# Patient Record
Sex: Female | Born: 1945 | Race: White | Hispanic: No | Marital: Married | Smoking: Never smoker
Health system: Southern US, Community
[De-identification: ages and names within clinical notes are randomized; demographics above are authoritative.]

## PROBLEM LIST (undated history)

## (undated) DIAGNOSIS — M545 Low back pain, unspecified: Secondary | ICD-10-CM

## (undated) DIAGNOSIS — I7300 Raynaud's syndrome without gangrene: Secondary | ICD-10-CM

## (undated) DIAGNOSIS — K8020 Calculus of gallbladder without cholecystitis without obstruction: Secondary | ICD-10-CM

## (undated) DIAGNOSIS — H532 Diplopia: Secondary | ICD-10-CM

## (undated) DIAGNOSIS — G8929 Other chronic pain: Secondary | ICD-10-CM

## (undated) DIAGNOSIS — S060XAA Concussion with loss of consciousness status unknown, initial encounter: Secondary | ICD-10-CM

## (undated) DIAGNOSIS — K635 Polyp of colon: Secondary | ICD-10-CM

## (undated) DIAGNOSIS — S060X9A Concussion with loss of consciousness of unspecified duration, initial encounter: Secondary | ICD-10-CM

## (undated) DIAGNOSIS — N951 Menopausal and female climacteric states: Secondary | ICD-10-CM

## (undated) DIAGNOSIS — G4700 Insomnia, unspecified: Secondary | ICD-10-CM

## (undated) DIAGNOSIS — T7840XA Allergy, unspecified, initial encounter: Secondary | ICD-10-CM

## (undated) DIAGNOSIS — Z90710 Acquired absence of both cervix and uterus: Secondary | ICD-10-CM

## (undated) DIAGNOSIS — M4310 Spondylolisthesis, site unspecified: Secondary | ICD-10-CM

## (undated) DIAGNOSIS — M810 Age-related osteoporosis without current pathological fracture: Secondary | ICD-10-CM

## (undated) DIAGNOSIS — Z973 Presence of spectacles and contact lenses: Secondary | ICD-10-CM

## (undated) DIAGNOSIS — M8580 Other specified disorders of bone density and structure, unspecified site: Secondary | ICD-10-CM

## (undated) DIAGNOSIS — H269 Unspecified cataract: Secondary | ICD-10-CM

## (undated) DIAGNOSIS — J189 Pneumonia, unspecified organism: Secondary | ICD-10-CM

## (undated) DIAGNOSIS — J302 Other seasonal allergic rhinitis: Secondary | ICD-10-CM

## (undated) DIAGNOSIS — M1990 Unspecified osteoarthritis, unspecified site: Secondary | ICD-10-CM

## (undated) DIAGNOSIS — C50919 Malignant neoplasm of unspecified site of unspecified female breast: Secondary | ICD-10-CM

## (undated) DIAGNOSIS — C50311 Malignant neoplasm of lower-inner quadrant of right female breast: Secondary | ICD-10-CM

## (undated) DIAGNOSIS — Z9989 Dependence on other enabling machines and devices: Secondary | ICD-10-CM

## (undated) DIAGNOSIS — C801 Malignant (primary) neoplasm, unspecified: Secondary | ICD-10-CM

## (undated) HISTORY — DX: Other specified disorders of bone density and structure, unspecified site: M85.80

## (undated) HISTORY — DX: Unspecified osteoarthritis, unspecified site: M19.90

## (undated) HISTORY — DX: Unspecified cataract: H26.9

## (undated) HISTORY — DX: Raynaud's syndrome without gangrene: I73.00

## (undated) HISTORY — PX: ENDOSCOPIC VEIN LASER TREATMENT: SHX1508

## (undated) HISTORY — DX: Malignant neoplasm of lower-inner quadrant of right female breast: C50.311

## (undated) HISTORY — PX: LAPAROSCOPIC ASSISTED VAGINAL HYSTERECTOMY: SHX5398

## (undated) HISTORY — DX: Calculus of gallbladder without cholecystitis without obstruction: K80.20

## (undated) HISTORY — DX: Age-related osteoporosis without current pathological fracture: M81.0

## (undated) HISTORY — PX: VAGINAL HYSTERECTOMY: SUR661

## (undated) HISTORY — PX: WISDOM TOOTH EXTRACTION: SHX21

## (undated) HISTORY — DX: Allergy, unspecified, initial encounter: T78.40XA

## (undated) HISTORY — DX: Malignant (primary) neoplasm, unspecified: C80.1

## (undated) HISTORY — DX: Concussion with loss of consciousness of unspecified duration, initial encounter: S06.0X9A

## (undated) HISTORY — PX: POLYPECTOMY: SHX149

## (undated) HISTORY — PX: TUBAL LIGATION: SHX77

## (undated) HISTORY — PX: FOOT NEUROMA SURGERY: SHX646

## (undated) HISTORY — PX: CHOLECYSTECTOMY: SHX55

## (undated) HISTORY — DX: Spondylolisthesis, site unspecified: M43.10

## (undated) HISTORY — PX: APPENDECTOMY: SHX54

## (undated) HISTORY — DX: Polyp of colon: K63.5

## (undated) HISTORY — DX: Insomnia, unspecified: G47.00

## (undated) HISTORY — PX: JOINT REPLACEMENT: SHX530

## (undated) HISTORY — PX: TONSILLECTOMY: SUR1361

---

## 1997-11-15 ENCOUNTER — Other Ambulatory Visit: Admission: RE | Admit: 1997-11-15 | Discharge: 1997-11-15 | Payer: Self-pay | Admitting: Gynecology

## 1998-09-04 ENCOUNTER — Other Ambulatory Visit: Admission: RE | Admit: 1998-09-04 | Discharge: 1998-09-04 | Payer: Self-pay | Admitting: Gynecology

## 1998-10-30 ENCOUNTER — Inpatient Hospital Stay (HOSPITAL_COMMUNITY): Admission: RE | Admit: 1998-10-30 | Discharge: 1998-11-01 | Payer: Self-pay | Admitting: Gynecology

## 1999-07-31 ENCOUNTER — Encounter: Admission: RE | Admit: 1999-07-31 | Discharge: 1999-08-14 | Payer: Self-pay | Admitting: Orthopaedic Surgery

## 1999-10-22 ENCOUNTER — Encounter: Payer: Self-pay | Admitting: Gynecology

## 1999-10-22 ENCOUNTER — Ambulatory Visit (HOSPITAL_COMMUNITY): Admission: RE | Admit: 1999-10-22 | Discharge: 1999-10-22 | Payer: Self-pay | Admitting: Gynecology

## 1999-10-22 ENCOUNTER — Other Ambulatory Visit: Admission: RE | Admit: 1999-10-22 | Discharge: 1999-10-22 | Payer: Self-pay | Admitting: Gynecology

## 2000-02-18 ENCOUNTER — Encounter: Payer: Self-pay | Admitting: Internal Medicine

## 2000-02-19 ENCOUNTER — Encounter: Payer: Self-pay | Admitting: Internal Medicine

## 2000-03-24 ENCOUNTER — Ambulatory Visit (HOSPITAL_COMMUNITY): Admission: RE | Admit: 2000-03-24 | Discharge: 2000-03-24 | Payer: Self-pay | Admitting: Occupational Medicine

## 2000-03-24 ENCOUNTER — Encounter (INDEPENDENT_AMBULATORY_CARE_PROVIDER_SITE_OTHER): Payer: Self-pay | Admitting: *Deleted

## 2000-04-05 ENCOUNTER — Encounter: Payer: Self-pay | Admitting: Internal Medicine

## 2000-12-14 ENCOUNTER — Encounter: Admission: RE | Admit: 2000-12-14 | Discharge: 2001-03-14 | Payer: Self-pay | Admitting: Gynecology

## 2000-12-14 ENCOUNTER — Other Ambulatory Visit: Admission: RE | Admit: 2000-12-14 | Discharge: 2000-12-14 | Payer: Self-pay | Admitting: Gynecology

## 2001-12-14 ENCOUNTER — Other Ambulatory Visit: Admission: RE | Admit: 2001-12-14 | Discharge: 2001-12-14 | Payer: Self-pay | Admitting: Gynecology

## 2002-12-31 ENCOUNTER — Other Ambulatory Visit: Admission: RE | Admit: 2002-12-31 | Discharge: 2002-12-31 | Payer: Self-pay | Admitting: Gynecology

## 2003-08-07 ENCOUNTER — Ambulatory Visit (HOSPITAL_COMMUNITY): Admission: RE | Admit: 2003-08-07 | Discharge: 2003-08-07 | Payer: Self-pay | Admitting: Unknown Physician Specialty

## 2004-02-04 ENCOUNTER — Other Ambulatory Visit: Admission: RE | Admit: 2004-02-04 | Discharge: 2004-02-04 | Payer: Self-pay | Admitting: Gynecology

## 2004-05-02 ENCOUNTER — Emergency Department (HOSPITAL_COMMUNITY): Admission: EM | Admit: 2004-05-02 | Discharge: 2004-05-02 | Payer: Self-pay | Admitting: Family Medicine

## 2004-05-12 ENCOUNTER — Encounter: Admission: RE | Admit: 2004-05-12 | Discharge: 2004-07-02 | Payer: Self-pay | Admitting: Orthopedic Surgery

## 2004-07-09 ENCOUNTER — Ambulatory Visit: Payer: Self-pay | Admitting: Family Medicine

## 2004-08-13 ENCOUNTER — Ambulatory Visit: Payer: Self-pay | Admitting: Family Medicine

## 2004-08-21 ENCOUNTER — Encounter: Admission: RE | Admit: 2004-08-21 | Discharge: 2004-11-19 | Payer: Self-pay | Admitting: Gynecology

## 2005-01-04 ENCOUNTER — Ambulatory Visit: Payer: Self-pay | Admitting: Family Medicine

## 2005-02-17 ENCOUNTER — Other Ambulatory Visit: Admission: RE | Admit: 2005-02-17 | Discharge: 2005-02-17 | Payer: Self-pay | Admitting: Gynecology

## 2006-02-24 ENCOUNTER — Encounter: Payer: Self-pay | Admitting: Internal Medicine

## 2006-02-25 ENCOUNTER — Encounter: Payer: Self-pay | Admitting: Internal Medicine

## 2006-03-04 ENCOUNTER — Other Ambulatory Visit: Admission: RE | Admit: 2006-03-04 | Discharge: 2006-03-04 | Payer: Self-pay | Admitting: Gynecology

## 2006-03-10 ENCOUNTER — Ambulatory Visit: Payer: Self-pay | Admitting: Family Medicine

## 2006-04-20 ENCOUNTER — Ambulatory Visit (HOSPITAL_COMMUNITY): Admission: RE | Admit: 2006-04-20 | Discharge: 2006-04-20 | Payer: Self-pay | Admitting: Gastroenterology

## 2006-04-20 ENCOUNTER — Encounter: Payer: Self-pay | Admitting: Internal Medicine

## 2006-04-20 ENCOUNTER — Encounter (INDEPENDENT_AMBULATORY_CARE_PROVIDER_SITE_OTHER): Payer: Self-pay | Admitting: *Deleted

## 2006-04-20 ENCOUNTER — Encounter (INDEPENDENT_AMBULATORY_CARE_PROVIDER_SITE_OTHER): Payer: Self-pay | Admitting: Specialist

## 2006-05-06 ENCOUNTER — Ambulatory Visit: Payer: Self-pay | Admitting: Family Medicine

## 2006-05-31 ENCOUNTER — Ambulatory Visit: Payer: Self-pay | Admitting: Family Medicine

## 2006-10-20 ENCOUNTER — Ambulatory Visit: Payer: Self-pay | Admitting: Family Medicine

## 2006-11-03 ENCOUNTER — Ambulatory Visit: Payer: Self-pay | Admitting: Family Medicine

## 2006-11-29 ENCOUNTER — Ambulatory Visit: Payer: Self-pay | Admitting: Family Medicine

## 2007-03-07 ENCOUNTER — Other Ambulatory Visit: Admission: RE | Admit: 2007-03-07 | Discharge: 2007-03-07 | Payer: Self-pay | Admitting: Gynecology

## 2008-04-08 ENCOUNTER — Ambulatory Visit: Payer: Self-pay | Admitting: Gynecology

## 2008-04-10 ENCOUNTER — Encounter: Payer: Self-pay | Admitting: Gynecology

## 2008-04-10 ENCOUNTER — Ambulatory Visit: Payer: Self-pay | Admitting: Gynecology

## 2008-04-10 ENCOUNTER — Other Ambulatory Visit: Admission: RE | Admit: 2008-04-10 | Discharge: 2008-04-10 | Payer: Self-pay | Admitting: Gynecology

## 2009-05-19 ENCOUNTER — Encounter: Payer: Self-pay | Admitting: Gynecology

## 2009-05-19 ENCOUNTER — Ambulatory Visit: Payer: Self-pay | Admitting: Gynecology

## 2009-05-19 ENCOUNTER — Other Ambulatory Visit: Admission: RE | Admit: 2009-05-19 | Discharge: 2009-05-19 | Payer: Self-pay | Admitting: Gynecology

## 2009-06-09 ENCOUNTER — Encounter (INDEPENDENT_AMBULATORY_CARE_PROVIDER_SITE_OTHER): Payer: Self-pay | Admitting: *Deleted

## 2009-08-27 ENCOUNTER — Ambulatory Visit: Payer: Self-pay | Admitting: Internal Medicine

## 2009-08-27 DIAGNOSIS — K633 Ulcer of intestine: Secondary | ICD-10-CM | POA: Insufficient documentation

## 2009-08-27 DIAGNOSIS — K5900 Constipation, unspecified: Secondary | ICD-10-CM | POA: Insufficient documentation

## 2010-01-14 ENCOUNTER — Ambulatory Visit: Payer: Self-pay | Admitting: Vascular Surgery

## 2010-05-14 ENCOUNTER — Ambulatory Visit: Payer: Self-pay | Admitting: Gynecology

## 2010-05-15 ENCOUNTER — Ambulatory Visit: Payer: Self-pay | Admitting: Gynecology

## 2010-05-21 ENCOUNTER — Ambulatory Visit: Payer: Self-pay | Admitting: Gynecology

## 2010-05-21 ENCOUNTER — Other Ambulatory Visit: Admission: RE | Admit: 2010-05-21 | Discharge: 2010-05-21 | Payer: Self-pay | Admitting: Gynecology

## 2010-06-02 ENCOUNTER — Encounter
Admission: RE | Admit: 2010-06-02 | Discharge: 2010-07-02 | Payer: Self-pay | Source: Home / Self Care | Attending: Sports Medicine | Admitting: Sports Medicine

## 2010-07-07 ENCOUNTER — Encounter
Admission: RE | Admit: 2010-07-07 | Discharge: 2010-08-04 | Payer: Self-pay | Source: Home / Self Care | Attending: Sports Medicine | Admitting: Sports Medicine

## 2010-08-06 NOTE — Letter (Signed)
Summary: [facility]  [facility]   Imported By: Sherian Rein 09/17/2009 10:24:50  _____________________________________________________________________  External Attachment:    Type:   Image     Comment:   External Document

## 2010-08-06 NOTE — Op Note (Signed)
Summary: COLON (Dr Loreta Ave)   NAME:  Maria Jackson, Maria Jackson              ACCOUNT NO.:  [id number]   MEDICAL RECORD NO.:  [id number]          PATIENT TYPE:  AMB   LOCATION:  ENDO                         FACILITY:  [facility]   PHYSICIAN:  Anselmo Rod, M.D.  DATE OF BIRTH:  February 15, 1946   DATE OF PROCEDURE:  04/20/2006  DATE OF DISCHARGE:  04/20/2006                                 OPERATIVE REPORT   PROCEDURE:  Colonoscopy with multiple core biopsies.   ENDOSCOPIST:  Anselmo Rod, M.D.   INSTRUMENT:  Olympus videocolonoscope.   INDICATIONS FOR PROCEDURE:  A 65 year old white female undergoing a  screening colonoscopy to rule out colonic polyps, masses, etcetera.  Patient  has a history of chronic constipation and occasional rectal bleeding.   PREPROCEDURE PREPARATION:  Informed consent was procured from the patient.  The patient was fasted for 8 hours prior to the procedure after being  prepped with Dulcolax pills and a gallon of Trilyte the night prior to the  procedure.  The risks and benefits of the procedure including a 10% missed  rate of cancer and polyps were discussed with the patient as well.   PREPROCEDURE PHYSICAL:  VITAL SIGNS: The patient with stable vital signs.  NECK:  Supple.  CHEST:  Clear to auscultation.  HEART:  S1, S2 regular.  ABDOMEN:  Soft with normal bowel sounds.   DESCRIPTION OF PROCEDURE:  The patient was placed in the left lateral  decubitus position and sedated with 100 mcg of Fentanyl and 10 mg of Versed  in incremental doses. Once the patient was adequately sedated, maintained on  low-flow oxygen, and continuous cardiac monitoring; the Olympus  videocolonoscope was advanced from the rectum to the cecum.  The appendiceal  orifice and ileocecal valve were clearly visualized and photographed.  No  masses, polyps, erosions, or diverticula were noted.  Patchy ulceration was  biopsied from the rectum at 5 cm.  This may represent a stercoral ulcer.  Biopsies  were done. The patient's position was turned from the left side to  the right lateral position; with gentle application of abdominal pressure  reached the cecal base.  There was no evidence of hemorrhoids on  retroflexion of the rectum.  The patient tolerated the procedure well  without complications.  There was a large amount of residual stool in the  dependent areas of the colon; and, therefore, small lesions could be missed.   IMPRESSION:  1. Patchy ulceration biopsies from 5 cm question stercoral ulcer.  2. Large amount of solid stool in the colon, small lesions could be      missed.  3. Difficult procedure, the patient's position had to be changed from the      left lateral to the supine and the right lateral position; with gentle      application of abdominal pressure reached the cecal base.   RECOMMENDATIONS:  1. Await pathology results.  2. Avoid all nonsteroidals for now.  3. Increase fluid and fiber in the diet.  4. Trial of Amitiza if necessary.  5. Outpatient follow up in the next 2  weeks for further recommendations.      Anselmo Rod, M.D.  Electronically Signed     JNM/MEDQ  D:  04/20/2006  T:  04/22/2006  Job:  [id number]   cc:   Gaetano Hawthorne. Lily Peer, M.D.  Delaney Meigs, M.D.   FINAL DIAGNOSIS    ***MICROSCOPIC EXAMINATION AND DIAGNOSIS***    RECTUM, BIOPSY: FINDINGS SUGGESTIVE OF ISCHEMIC COLITIS. SEE   COMMENT.    COMMENT   The sections show fragments of benign colonic mucosa with   ulceration and necrosis of the surface epithelium with underlying   reactive/regenerative type glands with mucus depletion and   superficial hemorrhages in the lamina propria. There is focal   inflammatory exudate on the surface epithelium with predominately   neutrophils. This pattern is usually seen in ischemic injury   although resolving pseudomembranous colitis may have a similar   picture. NSAIDs related mucosa injury may sometime produce a   similar picture  although this is more commonly seen in biopsies   from the right colon. Clinical and endoscopic correlation would   be helpful. (EAA:gt, 04/21/06)    gdt   Date Reported: 04/21/2006 Alden Server A. Delila Spence, MD   *** Electronically Signed Out By EAA ***    Clinical information   Screening (to)    specimen(s) obtained   Rectum, biopsy    Gross Description   Received in formalin are tan, soft tissue fragments that are   submitted in toto. Number: multiple   Size: 0.4 x 0.3 x 0.1 cm/1 block (SW:jy) 04/20/06    jy/

## 2010-08-06 NOTE — Assessment & Plan Note (Signed)
Summary: CONSULT BEFORE COL..EM   History of Present Illness Visit Type: consult Primary GI MD: Lina Sar MD Primary Provider: Joette Catching, MD  Requesting Provider: Reynaldo Minium, MD Chief Complaint: Consult colon.  Pt c/o constipation  History of Present Illness:   This is a 65 year old female who comes today to discuss having a colonoscopy. She has seen Dr Loreta Ave in the past for colonoscopies. A colonoscopy completed in 2001 due to rectal bleeding and constipation showedpoor prep, no large masses or polyps and no evidence of diverticular disease. Marland Kitchen Her last colonoscopy completed in October 2007 showed patchy ulceration biopsies from 5 cm and a question of a stercoral ulcer. There was a large amount of solid stool in the colon, and the procedure was noted to be difficult. Rectal biopsies obtained showed findings suggestive of ischemic colitis. Patient comes today with complaints of continued chronic constipation. She describes a piece of tissue prolapsing through  the rectum which she has to reduce manually.     GI Review of Systems      Denies abdominal pain, acid reflux, belching, bloating, chest pain, dysphagia with liquids, dysphagia with solids, heartburn, loss of appetite, nausea, vomiting, vomiting blood, weight loss, and  weight gain.        Denies anal fissure, black tarry stools, change in bowel habit, constipation, diarrhea, diverticulosis, fecal incontinence, heme positive stool, hemorrhoids, irritable bowel syndrome, jaundice, light color stool, liver problems, rectal bleeding, and  rectal pain.    Current Medications (verified): 1)  Ambien 10 Mg Tabs (Zolpidem Tartrate) .... One Tablet By Mouth At Bedtime 2)  Fluticasone Propionate 50 Mcg/act Susp (Fluticasone Propionate) .... As Needed 3)  Centrum Silver Ultra Womens  Tabs (Multiple Vitamins-Minerals) .... One Tablet By Mouth Once Daily 4)  Magnesium 250 Mg Tabs (Magnesium) .... One Tablet By Mouth Once Daily 5)   Aspirin 81 Mg  Tabs (Aspirin) .... One Tablet By Mouth Once Daily 6)  Fish Oil 1200 Mg Caps (Omega-3 Fatty Acids) .... One Tablet By Mouth Once Daily 7)  Citrucel 500 Mg Tabs (Methylcellulose (Laxative)) .... One Tablet By Mouth Once Daily  Allergies (verified): 1)  ! Sulfa 2)  ! Ibuprofen  Past History:  Past Medical History: Arthritis Urinary Tract Infection  Past Surgical History: Appendectomy Cholecystectomy Hysterectomy  Family History: No FH of Colon Cancer: Family History of Diabetes: Mother Family History of Heart Disease: Father   Social History: Occupation: Housewife Married 2 childern Patient has never smoked.  Alcohol Use - yes: Red Wine Occ. Illicit Drug Use - no Smoking Status:  never Drug Use:  no  Review of Systems       The patient complains of allergy/sinus, arthritis/joint pain, nosebleeds, and sleeping problems.  The patient denies anemia, anxiety-new, back pain, blood in urine, breast changes/lumps, change in vision, confusion, cough, coughing up blood, depression-new, fainting, fatigue, fever, headaches-new, hearing problems, heart murmur, heart rhythm changes, itching, menstrual pain, muscle pains/cramps, night sweats, pregnancy symptoms, shortness of breath, skin rash, sore throat, swelling of feet/legs, swollen lymph glands, thirst - excessive , urination - excessive , urination changes/pain, urine leakage, vision changes, and voice change.         Pertinent positive and negative review of systems were noted in the above HPI. All other ROS was otherwise negative.   Vital Signs:  Patient profile:   65 year old female Height:      63 inches Weight:      147 pounds BMI:  26.13 BSA:     1.70 Pulse rate:   60 / minute Pulse rhythm:   regular BP sitting:   120 / 72  (left arm) Cuff size:   regular  Vitals Entered By: Ok Anis CMA (August 27, 2009 9:12 AM)  Physical Exam  General:  Well developed, well nourished, no acute  distress. Eyes:  PERRLA, no icterus. Mouth:  No deformity or lesions, dentition normal. Neck:  Supple; no masses or thyromegaly. Lungs:  Clear throughout to auscultation. Heart:  Regular rate and rhythm; no murmurs, rubs,  or bruits. Abdomen:  Soft, nontender and nondistended. No masses, hepatosplenomegaly or hernias noted. Normal bowel sounds. Rectal:  rectal and anoscopic exam reveals normal perianal area. First grade internal hemorrhoids are partially prolapsing through the rectum and are easily reducible. There is no thrombosis and no bleeding. Stool is Hemoccult negative. Rectal sphincter tone is normal. Extremities:  No clubbing, cyanosis, edema or deformities noted. Neurologic:  Alert and oriented x4; grossly normal neurologically. Skin:  Intact without significant lesions or rashes. Psych:  Alert and cooperative. Normal mood and affect.   Impression & Recommendations:  Problem # 1:  ULCERATION OF INTESTINE (ZOX-096.04) Patient had an ischemic rectal ulcer found on a colonoscopy in 2007. This was not demonstrated on today's anoscopic exam. It was likely  related to  poor evacuation of the stool due to her rectocele and slow transit time. I advised the patient to stay on a high-fiber diet and increase her magnesium to 500 mg daily. She may use glycerin suppositories for rectal evacuation. I assured her that the small  hemorrhoids which prolapse is not of any concern.  Problem # 2:  CONSTIPATION (ICD-564.00) Patient has chronic constipation due to decreased colonoc  motility and redundant colon as evidenced by prior colonoscopies. Her constipation has been a lifelong problem. She will increase magnesium and use glycerin suppositories. I also suggested stool softeners and a high-fiber diet as well as fiber supplements. Her next colonoscopy will be due in October 2012.  Patient Instructions: 1)  recall colonoscopy October 2012. 2)  Glycerin suppositories one p.r.n. 3)  Increase magnesium  to 500 mg daily 4)  High-fiber diet booklet given. 5)  She will need a split prep prior to her next colonoscopy with clear liquids for 48 hours prior to the exam and possibly additional enemas. 6)  Copy sent to : Dr J.Fernandez 7)  The medication list was reviewed and reconciled.  All changed / newly prescribed medications were explained.  A complete medication list was provided to the patient / caregiver.

## 2010-08-06 NOTE — Letter (Signed)
Summary: [facility]  [facility]   Imported By: Sherian Rein 09/17/2009 10:26:13  _____________________________________________________________________  External Attachment:    Type:   Image     Comment:   External Document

## 2010-08-06 NOTE — Letter (Signed)
Summary: [facility]  [facility]   Imported By: Sherian Rein 09/17/2009 10:27:11  _____________________________________________________________________  External Attachment:    Type:   Image     Comment:   External Document

## 2010-08-06 NOTE — Op Note (Signed)
Summary: COLON (Dr Loreta Ave)                    [facility]. [facility]  Patient:    Maria Jackson, KOTLARZ                     MRN: [id number] Proc. Date: 03/24/00 Adm. Date:  [id number] Attending:  Charna Elizabeth CC:         Gaetano Hawthorne. Lily Peer, M.D.   Operative Report  DATE OF BIRTH:  07/01/1946  PROCEDURE PERFORMED:  Colonoscopy.  ENDOSCOPIST:  Anselmo Rod, M.D.  INSTRUMENT:  Olympus video colonoscope.  INDICATION FOR PROCEDURE:  Rectal bleeding in a 65 year old white female with a longstanding history of constipation.  Rule out for colonic polyps, masses, hemorrhoids, etc.  PREPROCEDURE PREPARATION:  Informed consent was procured from the patient. The patient was fasting for eight hours prior to the procedure and prepped with a bottle of magnesium citrate and a gallon of NuLYTELY the night prior to the procedure.  PREPROCEDURE PHYSICAL:  VITAL SIGNS:  Stable.  NECK:  Supple.  CHEST:  Clear to auscultation.  S1, S2 regular.  No murmur, rub, gallop, rhonchi, or wheezing.  ABDOMEN:  Soft with normal abdominal bowel sounds.  DESCRIPTION OF THE PROCEDURE:  The patient was placed in the left lateral decubitus position and sedated with 100 mg of Demerol and 10 mg of Versed intravenously.  Once the patient was adequately sedated and maintained on low-flow oxygen and continuous cardiac monitoring, the Olympus video colonoscope was advanced from the rectum to the cecum with extreme difficulty secondary to large amount of residual stool in the colon.  This was present almost throughout the colon.  No large masses or polyps were seen; however, small lesions may have been missed secondary to the poor prep.  The patient tolerated the procedure well without complications.  IMPRESSION: 1. Very poor prep. 2. No large masses or polyps seen. 3. No evidence of diverticular disease.  RECOMMENDATIONS: 1. The patient has been advised to increase fluid and fiber in her diet  to    help with bowel regularity. 2. Repeat colorectal cancer screening is recommended in the next five years. DD:  03/24/00 TD:  03/25/00 Job: [id number]

## 2010-11-17 NOTE — Procedures (Signed)
LOWER EXTREMITY VENOUS REFLUX EXAM   INDICATION:  Painful bilateral legs.   EXAM:  Using color-flow imaging and pulse Doppler spectral analysis, the  bilateral common femoral, superficial femoral, popliteal, posterior  tibial, greater and lesser saphenous veins are evaluated.  There is no  evidence suggesting deep venous insufficiency in the bilateral lower  extremities.   The bilateral saphenofemoral junction is competent. The bilateral GSV is  competent.   The bilateral proximal short saphenous vein demonstrates competency.   GSV Diameter (used if found to be incompetent only)                                            Right    Left  Proximal Greater Saphenous Vein           cm       cm  Proximal-to-mid-thigh                     cm       cm  Mid thigh                                 cm       cm  Mid-distal thigh                          cm       cm  Distal thigh                              cm       cm  Knee                                      cm       cm   IMPRESSION:  1. Bilateral greater saphenous vein appears with no reflux.  2. The bilateral greater saphenous veins are not aneurysmal.  3. The bilateral greater saphenous veins are not tortuous.  4. The deep venous system is competent.  5. The bilateral lesser saphenous veins are competent.   ___________________________________________  Larina Earthly, M.D.   CB/MEDQ  D:  01/14/2010  T:  01/14/2010  Job:  [id number]

## 2010-11-17 NOTE — Consult Note (Signed)
NEW PATIENT CONSULTATION   Maria Jackson, Maria Jackson  DOB:  1945-08-16                                       01/14/2010  ZOXWR#:[id number]   The patient presents today for evaluation of lower extremity venous  pathology.  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  This is bilaterally  and occurs more so with standing and is very intermittent in its  occurrence.  She does not have any history of DVT.  No history of venous  varicosities.  She did undergo sclerotherapy in the remote past with  hypertonic saline and this did cause some staining.  Her history is  otherwise negative for hypertension, diabetes or other major medical  difficulties.   Family history is significant for a brother with premature coronary  disease in his mid 60s, otherwise negative.   SOCIAL HISTORY:  She is married with two children.  She is a housewife.  She does not smoke and never has.  She has occasional wine consumption.   REVIEW OF SYSTEMS:  No weight loss or weight gain.  Her weight is  reported at 145 pounds.  She is 5 feet 4 inches tall.  CARDIAC:  Negative.  PULMONARY:  Negative.  GI:  Positive for occasional constipation.  GU:  Negative.  VASCULAR:  Negative.  NEUROLOGIC:  No dizziness or blackouts.  MUSCULOSKELETAL:  Does have arthritis in her right knee.  Psychiatric, ENT, hematologic and skin are all normal.   PAST SURGICAL HISTORY:  Significant for tonsillectomy, cholecystectomy,  appendectomy, hysterectomy.   PHYSICAL EXAMINATION:  General:  A well-developed, well-nourished white  female appearing stated age in no acute stress.  Vital signs:  Blood  pressure is 122/66, pulse 59, respirations 16.  HEENT:  Normal.  Her  radial and dorsalis pedis pulses are 2+ bilaterally.  Musculoskeletal:  Shows no major deformities or cyanosis.   Neurological:  No focal  weakness or paresthesias.  Skin:  Without ulcers or rashes.  She does  have marked telangiectasia over both lower extremities from her thighs  down onto her ankles bilaterally.  She does not have any varicosities.  She does not have edema currently.   She underwent noninvasive vascular laboratory testing which I have  ordered and independently interpreted.  This does show no evidence of  reflux in her deep or superficial system bilaterally in the great and  small saphenous vein.  I discussed this at length with the patient.  I  explained that in all likelihood her aching sensation is not related to  venous pathology since she does not have any evidence of venous  hypertension.  I did explain the significance of her telangiectasia.  I  explained that these do not indicate that she has a more serious  underlying condition with venous hypertension.  I did explain the  treatment options.  She had a very bad experience with sclerotherapy  with hypertonic saline in the past.  I explained that we would not use  this solution and that there were better options available that caused  much less discomfort with better results.  She understands this and will  notify us should she wish to proceed with any cosmetic treatment of her  telangiectasia.  Otherwise she  will see [place] on an as-needed basis.     Larina Earthly, M.D.  Electronically Signed   TFE/MEDQ  D:  01/14/2010  T:  01/15/2010  Job:  [id number]   cc:   Dr Joycelyn Rua, M.D.

## 2010-11-20 NOTE — Op Note (Signed)
NAME:  Maria Jackson, Maria Jackson              ACCOUNT NO.:  [id number]   MEDICAL RECORD NO.:  [id number]          PATIENT TYPE:  AMB   LOCATION:  ENDO                         FACILITY:  [facility]   PHYSICIAN:  Anselmo Rod, M.D.  DATE OF BIRTH:  1946/03/20   DATE OF PROCEDURE:  04/20/2006  DATE OF DISCHARGE:  04/20/2006                                 OPERATIVE REPORT   PROCEDURE:  Colonoscopy with multiple core biopsies.   ENDOSCOPIST:  Anselmo Rod, M.D.   INSTRUMENT:  Olympus videocolonoscope.   INDICATIONS FOR PROCEDURE:  A 65 year old white female undergoing a  screening colonoscopy to rule out colonic polyps, masses, etcetera.  Patient  has a history of chronic constipation and occasional rectal bleeding.   PREPROCEDURE PREPARATION:  Informed consent was procured from the patient.  The patient was fasted for 8 hours prior to the procedure after being  prepped with Dulcolax pills and a gallon of Trilyte the night prior to the  procedure.  The risks and benefits of the procedure including a 10% missed  rate of cancer and polyps were discussed with the patient as well.   PREPROCEDURE PHYSICAL:  VITAL SIGNS: The patient with stable vital signs.  NECK:  Supple.  CHEST:  Clear to auscultation.  HEART:  S1, S2 regular.  ABDOMEN:  Soft with normal bowel sounds.   DESCRIPTION OF PROCEDURE:  The patient was placed in the left lateral  decubitus position and sedated with 100 mcg of Fentanyl and 10 mg of Versed  in incremental doses. Once the patient was adequately sedated, maintained on  low-flow oxygen, and continuous cardiac monitoring; the Olympus  videocolonoscope was advanced from the rectum to the cecum.  The appendiceal  orifice and ileocecal valve were clearly visualized and photographed.  No  masses, polyps, erosions, or diverticula were noted.  Patchy ulceration was  biopsied from the rectum at 5 cm.  This may represent a stercoral ulcer.  Biopsies were done. The patient's  position was turned from the left side to  the right lateral position; with gentle application of abdominal pressure  reached the cecal base.  There was no evidence of hemorrhoids on  retroflexion of the rectum.  The patient tolerated the procedure well  without complications.  There was a large amount of residual stool in the  dependent areas of the colon; and, therefore, small lesions could be missed.   IMPRESSION:  1. Patchy ulceration biopsies from 5 cm question stercoral ulcer.  2. Large amount of solid stool in the colon, small lesions could be      missed.  3. Difficult procedure, the patient's position had to be changed from the      left lateral to the supine and the right lateral position; with gentle      application of abdominal pressure reached the cecal base.   RECOMMENDATIONS:  1. Await pathology results.  2. Avoid all nonsteroidals for now.  3. Increase fluid and fiber in the diet.  4. Trial of Amitiza if necessary.  5. Outpatient follow up in the next 2 weeks for further recommendations.  Anselmo Rod, M.D.  Electronically Signed     JNM/MEDQ  D:  04/20/2006  T:  04/22/2006  Job:  [id number]   cc:   Gaetano Hawthorne. Lily Peer, M.D.  Delaney Meigs, M.D.

## 2010-11-20 NOTE — Op Note (Signed)
[place]  Patient:    Maria Jackson, Maria Jackson                     MRN: [id number] Proc. Date: 03/24/00 Adm. Date:  [id number] Attending:  Charna Elizabeth CC:         Gaetano Hawthorne. Lily Peer, M.D.   Operative Report  DATE OF BIRTH:  1945-07-19  PROCEDURE PERFORMED:  Colonoscopy.  ENDOSCOPIST:  Anselmo Rod, M.D.  INSTRUMENT:  Olympus video colonoscope.  INDICATION FOR PROCEDURE:  Rectal bleeding in a 65 year old white female with a longstanding history of constipation.  Rule out for colonic polyps, masses, hemorrhoids, etc.  PREPROCEDURE PREPARATION:  Informed consent was procured from the patient. The patient was fasting for eight hours prior to the procedure and prepped with a bottle of magnesium citrate and a gallon of NuLYTELY the night prior to the procedure.  PREPROCEDURE PHYSICAL:  VITAL SIGNS:  Stable.  NECK:  Supple.  CHEST:  Clear to auscultation.  S1, S2 regular.  No murmur, rub, gallop, rhonchi, or wheezing.  ABDOMEN:  Soft with normal abdominal bowel sounds.  DESCRIPTION OF THE PROCEDURE:  The patient was placed in the left lateral decubitus position and sedated with 100 mg of Demerol and 10 mg of Versed intravenously.  Once the patient was adequately sedated and maintained on low-flow oxygen and continuous cardiac monitoring, the Olympus video colonoscope was advanced from the rectum to the cecum with extreme difficulty secondary to large amount of residual stool in the colon.  This was present almost throughout the colon.  No large masses or polyps were seen; however, small lesions may have been missed secondary to the poor prep.  The patient tolerated the procedure well without complications.  IMPRESSION: 1. Very poor prep. 2. No large masses or polyps seen. 3. No evidence of diverticular disease.  RECOMMENDATIONS: 1. The patient has been advised to increase fluid and fiber in her diet to    help with bowel  regularity. 2. Repeat colorectal cancer screening is recommended in the next five years. DD:  03/24/00 TD:  03/25/00 Job: [id number]

## 2011-03-17 ENCOUNTER — Other Ambulatory Visit: Payer: Self-pay | Admitting: *Deleted

## 2011-03-17 DIAGNOSIS — N6489 Other specified disorders of breast: Secondary | ICD-10-CM

## 2011-03-23 ENCOUNTER — Encounter: Payer: Self-pay | Admitting: Vascular Surgery

## 2011-03-25 ENCOUNTER — Ambulatory Visit (INDEPENDENT_AMBULATORY_CARE_PROVIDER_SITE_OTHER): Payer: Self-pay | Admitting: *Deleted

## 2011-03-25 DIAGNOSIS — I781 Nevus, non-neoplastic: Secondary | ICD-10-CM

## 2011-03-25 NOTE — Progress Notes (Signed)
Subjective:     Patient ID: Maria Jackson, female   DOB: 1945/08/03, 65 y.o.   MRN: [id number]  HPI  X=.3% Sotradecol administered with a 27g butterfly.  Patient received a total of 18cc foam.  Photos:yes  Compression stockings applied: yes    Review of Systems     Objective:   Physical Exam     Assessment:     Treated majority of areas of concern from the knee down. She will need more tx in the future. Tol well. Easy access. She will call later for a Feb. appt    Plan:     Follow prn. Prob 2 syringes next time.

## 2011-03-26 ENCOUNTER — Other Ambulatory Visit: Payer: Self-pay | Admitting: Obstetrics and Gynecology

## 2011-03-26 DIAGNOSIS — N6489 Other specified disorders of breast: Secondary | ICD-10-CM

## 2011-04-01 ENCOUNTER — Ambulatory Visit (INDEPENDENT_AMBULATORY_CARE_PROVIDER_SITE_OTHER): Payer: MEDICARE | Admitting: *Deleted

## 2011-04-01 DIAGNOSIS — I781 Nevus, non-neoplastic: Secondary | ICD-10-CM

## 2011-04-01 DIAGNOSIS — I83893 Varicose veins of bilateral lower extremities with other complications: Secondary | ICD-10-CM

## 2011-04-01 NOTE — Progress Notes (Signed)
Had the patient come in so I could evaluate whether or not she needed any treated areas to have blood expressed from them. She had problems/concerns with staining in the past. One area did need some expression of blood. Other sites were flat and are resolving well. I anticipate a good result for this nice lady. She will return in Jan. For further clean up.

## 2011-05-03 ENCOUNTER — Telehealth: Payer: Self-pay | Admitting: *Deleted

## 2011-05-03 NOTE — Telephone Encounter (Signed)
Noted. Thanks.

## 2011-05-03 NOTE — Telephone Encounter (Signed)
Patient having recall colonoscopy 05-20-11. From your office visit note on 08-27-09 in EMR states patient will need split prep,clear liquids for 48 hours and possibly additional enemas. Do you want patient to have moviprep,with clear liq. 48 hours prior to exam, and if needed enemas in [facility]? Please advise.

## 2011-05-03 NOTE — Telephone Encounter (Signed)
Yes, I like that. DB

## 2011-05-06 ENCOUNTER — Ambulatory Visit (AMBULATORY_SURGERY_CENTER): Payer: Medicare Other | Admitting: *Deleted

## 2011-05-06 ENCOUNTER — Encounter: Payer: Self-pay | Admitting: Internal Medicine

## 2011-05-06 VITALS — Ht 62.5 in | Wt 140.3 lb

## 2011-05-06 DIAGNOSIS — Z1211 Encounter for screening for malignant neoplasm of colon: Secondary | ICD-10-CM

## 2011-05-06 MED ORDER — PEG-KCL-NACL-NASULF-NA ASC-C 100 G PO SOLR: 1.0000 | Freq: Once | ORAL | Status: DC

## 2011-05-06 NOTE — Progress Notes (Signed)
Pt has Reynaud's Syndrome and would like staff to make sure that she is kept very warm due to her poor circulationa

## 2011-05-20 ENCOUNTER — Ambulatory Visit (AMBULATORY_SURGERY_CENTER): Payer: Medicare Other | Admitting: Internal Medicine

## 2011-05-20 ENCOUNTER — Encounter: Payer: Self-pay | Admitting: Internal Medicine

## 2011-05-20 ENCOUNTER — Telehealth: Payer: Self-pay | Admitting: Internal Medicine

## 2011-05-20 VITALS — BP 126/61 | HR 56 | Temp 98.0°F | Resp 18 | Ht 62.5 in | Wt 140.0 lb

## 2011-05-20 DIAGNOSIS — Z1211 Encounter for screening for malignant neoplasm of colon: Secondary | ICD-10-CM

## 2011-05-20 MED ORDER — SODIUM CHLORIDE 0.9 % IV SOLN: 500.0000 mL | INTRAVENOUS | Status: DC

## 2011-05-20 MED ORDER — HYDROCORTISONE 2.5 % RE CREA: TOPICAL_CREAM | Freq: Two times a day (BID) | RECTAL | Status: AC

## 2011-05-20 NOTE — Patient Instructions (Signed)
FOLLOW THE DISCHARGE INSTRUCTIONS ON THE BLUE AND GREEN INSTRUCTION SHEETS.  CONTINUE YOUR MEDICATIONS. NEW MEDICATION PROCTOSOL HC RECTAL CREAM.  INCREASE THE FIBER IN YOUR DIET WITH LIBERAL FLUID INTAKE.  NEXT COLONOSCOPY IN 5 YEARS

## 2011-05-20 NOTE — Progress Notes (Signed)
B/P down (77/50); skin warm and dry.  IVF's wide open @1004  1010 B/P 117/63

## 2011-05-20 NOTE — Telephone Encounter (Signed)
Pt had questions about what type of fiber supplement is best.  Advised her that metamucil, citracel or benefiber is preferred.  Also asked about how long she should use hemorrhoid medication.  Told her that she should use it until discomfort subsides. Pt verbalized understanding.

## 2011-05-20 NOTE — Progress Notes (Signed)
Patient did not experience any of the following events: a burn prior to discharge; a fall within the facility; wrong site/side/patient/procedure/implant event; or a hospital transfer or hospital admission upon discharge from the facility. (G8907) Patient did not have preoperative order for IV antibiotic SSI prophylaxis. (G8918)  

## 2011-05-21 ENCOUNTER — Telehealth: Payer: Self-pay | Admitting: *Deleted

## 2011-05-21 NOTE — Telephone Encounter (Signed)

## 2011-05-24 ENCOUNTER — Encounter: Payer: Self-pay | Admitting: Gynecology

## 2011-05-24 ENCOUNTER — Ambulatory Visit (INDEPENDENT_AMBULATORY_CARE_PROVIDER_SITE_OTHER): Payer: Medicare Other | Admitting: Gynecology

## 2011-05-24 ENCOUNTER — Other Ambulatory Visit (HOSPITAL_COMMUNITY)
Admission: RE | Admit: 2011-05-24 | Discharge: 2011-05-24 | Disposition: A | Discharge status: Home / Self Care | Payer: Medicare Other | Source: Ambulatory Visit | Attending: Gynecology | Admitting: Gynecology

## 2011-05-24 VITALS — BP 124/80 | Ht 62.5 in | Wt 138.0 lb

## 2011-05-24 DIAGNOSIS — N952 Postmenopausal atrophic vaginitis: Secondary | ICD-10-CM

## 2011-05-24 DIAGNOSIS — Z01419 Encounter for gynecological examination (general) (routine) without abnormal findings: Secondary | ICD-10-CM

## 2011-05-24 DIAGNOSIS — M899 Disorder of bone, unspecified: Secondary | ICD-10-CM

## 2011-05-24 DIAGNOSIS — M8580 Other specified disorders of bone density and structure, unspecified site: Secondary | ICD-10-CM | POA: Insufficient documentation

## 2011-05-24 DIAGNOSIS — Z124 Encounter for screening for malignant neoplasm of cervix: Secondary | ICD-10-CM

## 2011-05-24 NOTE — Progress Notes (Signed)
Maria Jackson [birth year] [id number]   History:    65 y.o.  for annual exam with main complaint of vaginal atrophy. Patient had a colonoscopy by Dr. Dickie La last week was informed it was normal but followup recommended in 5 years. Her mammogram was in 2012 which was normal she in frequently does her self breast examination. She was weighing 140/ear down to 138 she attempts to exercise a regular diet. Her primary physician Dr. Martha Clan that all her lab work in June of this year so no additional blood work will be repeated today. Patient has history of TAH BSO Dr.  Clelia Croft  placed her on Premarin vaginal cream she was applying every day but she thought that she gaining weight and switched to Replens as a vaginal moisturizer. Review of her record indicated in 2011 her bone density study had demonstrated significant decrease in bone mineralization at the AP spine and the left femoral neck lowest T score of -1.7. She was screened for celiac sprue antibodies which were negative, PTH calcium and vitamin D were all normal. Frax analysis demonstrated she was supple threshold and did not need to be placed on and antiresorptive agent at the present time.  Past medical history,surgical history, family history and social history were all reviewed and documented in the EPIC chart.  Gynecologic History No LMP recorded. Patient has had a hysterectomy. Contraception: none Last Pap: 2011. Results were: normal Last mammogram: 2012. Results were: normal  Obstetric History OB History    Grav Para Term Preterm Abortions TAB SAB Ect Mult Living   2 2        2      # Outc Date GA Lbr Len/2nd Wgt Sex Del Anes PTL Lv   1 PAR            2 PAR                ROS:  Was performed and pertinent positives and negatives are included in the history.  Exam: chaperone present  BP 124/80  Ht 5' 2.5" (1.588 m)  Wt 138 lb (62.596 kg)  BMI 24.84 kg/m2  Body mass index is 24.84 kg/(m^2).  General appearance : Well  developed well nourished female. No acute distress HEENT: Neck supple, trachea midline, no carotid bruits, no thyroidmegaly Lungs: Clear to auscultation, no rhonchi or wheezes, or rib retractions  Heart: Regular rate and rhythm, no murmurs or gallops Breast:Examined in sitting and supine position were symmetrical in appearance, no palpable masses or tenderness,  no skin retraction, no nipple inversion, no nipple discharge, no skin discoloration, no axillary or supraclavicular lymphadenopathy Abdomen: no palpable masses or tenderness, no rebound or guarding Extremities: no edema or skin discoloration or tenderness  Pelvic:  Bartholin, Urethra, Skene Glands: Within normal limits             Vagina: No gross lesions or discharge  Cervix: Absent Uterus  Absent  Adnexa  Without masses or tenderness  Anus and perineum  normal   Rectovaginal  normal sphincter tone without palpated masses or tenderness             Hemoccult obtained results pending at time of this dictation     Assessment/Plan:  65 y.o. female for annual exam with evidence of vaginal atrophy. She will be placed back on Premarin vaginal cream been asked her to apply only twice a week to help build up her vaginal epithelium expose the dryness on the external genitalia as well. She will  continue with her weightbearing exercises along with her calcium vitamin D also she's taking additional 2000 units of vitamin D. Her Pap smear was done today fecal occult blood testing resulting in time of this dictation. She will need to follow bone density study here in the office next year. We'll see her back in one year or when necessary.    Ok Edwards MD, 10:52 AM 05/24/2011

## 2011-06-03 ENCOUNTER — Encounter: Payer: Self-pay | Admitting: Gynecology

## 2011-06-14 ENCOUNTER — Encounter: Payer: Self-pay | Admitting: Vascular Surgery

## 2011-07-28 ENCOUNTER — Ambulatory Visit (INDEPENDENT_AMBULATORY_CARE_PROVIDER_SITE_OTHER): Payer: Medicare Other | Admitting: *Deleted

## 2011-07-28 DIAGNOSIS — I781 Nevus, non-neoplastic: Secondary | ICD-10-CM

## 2011-07-28 NOTE — Progress Notes (Signed)
X=>3% Sotradecol administered with a 27g butterfly.  Patient received a total of 18cc foam.  She still has lots of blue and red spiders. Staining present from first tx. Really sweet lady with realistic expectations. She should return in late fall for more.  Photos: yes  Compression stockings applied: yes

## 2011-11-05 ENCOUNTER — Other Ambulatory Visit: Payer: Self-pay | Admitting: Dermatology

## 2011-11-05 DIAGNOSIS — C801 Malignant (primary) neoplasm, unspecified: Secondary | ICD-10-CM

## 2011-11-05 HISTORY — DX: Malignant (primary) neoplasm, unspecified: C80.1

## 2012-01-31 LAB — IFOBT (OCCULT BLOOD): IFOBT: NEGATIVE

## 2012-03-28 ENCOUNTER — Encounter: Payer: Self-pay | Admitting: Gynecology

## 2012-03-30 ENCOUNTER — Other Ambulatory Visit: Payer: Self-pay | Admitting: Dermatology

## 2012-05-17 ENCOUNTER — Other Ambulatory Visit: Payer: Self-pay | Admitting: Gynecology

## 2012-05-17 DIAGNOSIS — M8580 Other specified disorders of bone density and structure, unspecified site: Secondary | ICD-10-CM

## 2012-05-18 ENCOUNTER — Ambulatory Visit (INDEPENDENT_AMBULATORY_CARE_PROVIDER_SITE_OTHER): Payer: Medicare Other

## 2012-05-18 DIAGNOSIS — M949 Disorder of cartilage, unspecified: Secondary | ICD-10-CM

## 2012-05-18 DIAGNOSIS — M8580 Other specified disorders of bone density and structure, unspecified site: Secondary | ICD-10-CM

## 2012-05-18 DIAGNOSIS — M899 Disorder of bone, unspecified: Secondary | ICD-10-CM

## 2012-05-25 ENCOUNTER — Ambulatory Visit (INDEPENDENT_AMBULATORY_CARE_PROVIDER_SITE_OTHER): Payer: Medicare Other | Admitting: Gynecology

## 2012-05-25 ENCOUNTER — Encounter: Payer: Self-pay | Admitting: Gynecology

## 2012-05-25 VITALS — BP 124/70 | Ht 62.0 in | Wt 148.0 lb

## 2012-05-25 DIAGNOSIS — D225 Melanocytic nevi of trunk: Secondary | ICD-10-CM

## 2012-05-25 DIAGNOSIS — M949 Disorder of cartilage, unspecified: Secondary | ICD-10-CM

## 2012-05-25 DIAGNOSIS — M8580 Other specified disorders of bone density and structure, unspecified site: Secondary | ICD-10-CM

## 2012-05-25 DIAGNOSIS — N952 Postmenopausal atrophic vaginitis: Secondary | ICD-10-CM

## 2012-05-25 DIAGNOSIS — Z79890 Hormone replacement therapy (postmenopausal): Secondary | ICD-10-CM

## 2012-05-25 DIAGNOSIS — M899 Disorder of bone, unspecified: Secondary | ICD-10-CM

## 2012-05-25 DIAGNOSIS — D235 Other benign neoplasm of skin of trunk: Secondary | ICD-10-CM

## 2012-05-25 MED ORDER — NONFORMULARY OR COMPOUNDED ITEM: Status: DC

## 2012-05-25 NOTE — Progress Notes (Signed)
Patient ID: Maria Jackson, female   DOB: [birth year], 66 y.o.   MRN: [id number] History:    66 y.o.  for annual gyn exam. 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 Venancio Poisson dermatologist. Her primary physician is Dr. Clelia Croft who has done all her labs recently. Review of patient's records indicated she has history of osteopenia. She had been on Actonel from 2003-2006. In 2006 she will underneath the for 3 years. She discontinued both because of esophageal irritation. She is taking vitamin D and with her diet calcium. She has a history of total abdominal hysterectomy with bilateral salpingo-oophorectomy. Her vaccines all up-to-date. Her last colonoscopy was in 2012. Her recent Hemoccult testing was negative. Her mammogram was normal September 2013. Her last bone density study with her lowest T score -1.8 at the left femoral neck. The rest of her regions of interest has been with no significant change. Her Frax analysis was normal.  Past medical history,surgical history, family history and social history were all reviewed and documented in the EPIC chart.  Gynecologic History No LMP recorded. Patient has had a hysterectomy. Contraception: status post hysterectomy Last Pap: 2012. Results were: normal Last mammogram: 2013. Results were: normal  Obstetric History OB History    Grav Para Term Preterm Abortions TAB SAB Ect Mult Living   2 2 2       2      # Outc Date GA Lbr Len/2nd Wgt Sex Del Anes PTL Lv   1 TRM     F SVD  No Yes   2 TRM     M SVD  No Yes       ROS: A ROS was performed and pertinent positives and negatives are included in the history.  GENERAL: No fevers or chills. HEENT: No change in vision, no earache, sore throat or sinus congestion. NECK: No pain or stiffness. CARDIOVASCULAR: No chest pain or pressure. No palpitations. PULMONARY: No shortness of breath, cough or wheeze. GASTROINTESTINAL: No abdominal pain,  nausea, vomiting or diarrhea, melena or bright red blood per rectum. GENITOURINARY: No urinary frequency, urgency, hesitancy or dysuria. MUSCULOSKELETAL: No joint or muscle pain, no back pain, no recent trauma. DERMATOLOGIC: No rash, no itching, no lesions. ENDOCRINE: No polyuria, polydipsia, no heat or cold intolerance. No recent change in weight. HEMATOLOGICAL: No anemia or easy bruising or bleeding. NEUROLOGIC: No headache, seizures, numbness, tingling or weakness. PSYCHIATRIC: No depression, no loss of interest in normal activity or change in sleep pattern.     Exam: chaperone present  BP 124/70  Ht 5\' 2"  (1.575 m)  Wt 148 lb (67.132 kg)  BMI 27.07 kg/m2  Body mass index is 27.07 kg/(m^2).  General appearance : Well developed well nourished female. No acute distress HEENT: Neck supple, trachea midline, no carotid bruits, no thyroidmegaly Lungs: Clear to auscultation, no rhonchi or wheezes, or rib retractions  Heart: Regular rate and rhythm, no murmurs or gallops Breast:Examined in sitting and supine position were symmetrical in appearance, no palpable masses or tenderness,  no skin retraction, no nipple inversion, no nipple discharge, no skin discoloration, no axillary or supraclavicular lymphadenopathy Abdomen: no palpable masses or tenderness, no rebound or guarding Extremities: no edema or skin discoloration or tenderness  Pelvic:  Bartholin, Urethra, Skene Glands: Within normal limits             Vagina: No gross lesions or discharge  Cervix: Absent  Uterus absent  Adnexa  Without masses or tenderness  Anus and perineum  normal   Rectovaginal  normal sphincter tone without palpated masses or tenderness             Hemoccult done recently negative Physical Exam  Genitourinary:         Assessment/Plan:  66 y.o. female for annual exam who was diagnosed with left lateral leg squamous cell carcinoma in may of this year. Incidental finding at time of examination demonstrated 2  hyperpigmented areas near the left buttock/perineum. Patient will return back in one to 2 weeks for biopsy to rule out malignancy. We discussed the new Pap smear screening guidelines. Patient would know prior history of abnormal Pap smears. Patient with prior history of hysterectomy. Dr. Clelia Croft her primary physician has done all her lab work. She will continue to use the vaginal estrogen cream twice a week for vaginal atrophy. Patient fully aware of risks benefits and pros and cons.

## 2012-05-25 NOTE — Patient Instructions (Addendum)
Followup for biopsy in one to 2 weeks

## 2012-06-05 ENCOUNTER — Telehealth: Payer: Self-pay | Admitting: Gynecology

## 2012-06-05 NOTE — Telephone Encounter (Signed)
So the type thing she can just wear a band-aid on?  No restrictions regarding clothing or showering, etc?

## 2012-06-05 NOTE — Telephone Encounter (Signed)
A bandaide will be placed over it. She can shower the next day. JF

## 2012-06-05 NOTE — Telephone Encounter (Signed)
Reassure her that this is going to be very small procedure whereby we will inject local anesthetic and excise the area and sent to pathology.

## 2012-06-05 NOTE — Telephone Encounter (Signed)
Patient has appointment with you this Weds afternoon to "have moles removed" and wants to know what to expect as how she will take care of the area, how it will heal and follow-up.  Did not think to ask at visit as overwhelmed with the idea of having it done.  (Assessment/Plan: 66 y.o. female for annual exam who was diagnosed with left lateral leg squamous cell carcinoma in may of this year. Incidental finding at time of examination demonstrated 2 hyperpigmented areas near the left buttock/perineum. Patient will return back in one to 2 weeks for biopsy to rule out malignancy. We discussed the new Pap smear screening guidelines. Patient would know prior history of abnormal Pap smears. Patient with prior history of hysterectomy. Dr. Clelia Croft her primary physician has done all her lab work. She will continue to use the vaginal estrogen cream twice a week for vaginal atrophy. Patient fully aware of risks benefits and pros and cons.)

## 2012-06-06 ENCOUNTER — Encounter: Payer: Self-pay | Admitting: Gynecology

## 2012-06-06 NOTE — Telephone Encounter (Signed)
Patient advised.

## 2012-06-07 ENCOUNTER — Encounter: Payer: Self-pay | Admitting: Gynecology

## 2012-06-07 ENCOUNTER — Ambulatory Visit (INDEPENDENT_AMBULATORY_CARE_PROVIDER_SITE_OTHER): Payer: Medicare Other | Admitting: Gynecology

## 2012-06-07 VITALS — BP 126/78

## 2012-06-07 DIAGNOSIS — C44721 Squamous cell carcinoma of skin of unspecified lower limb, including hip: Secondary | ICD-10-CM

## 2012-06-07 DIAGNOSIS — N9089 Other specified noninflammatory disorders of vulva and perineum: Secondary | ICD-10-CM | POA: Insufficient documentation

## 2012-06-07 MED ORDER — OXYCODONE-ACETAMINOPHEN 5-500 MG PO CAPS: 1.0000 | ORAL_CAPSULE | ORAL | Status: DC | PRN

## 2012-06-07 MED ORDER — ESTROGENS, CONJUGATED 0.625 MG/GM VA CREA: TOPICAL_CREAM | VAGINAL | Status: DC

## 2012-06-07 NOTE — Progress Notes (Signed)
Patient is a 66 year old who was seen the office on November 21st of this year for her annual gynecological examination. 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 Venancio Poisson dermatologist. During her pelvic examination she was found to have 2 hyperpigmented raised areas near the left buttock/perineum and was asked to return to the office today for biopsy. See previous note with description of picture of lesion. Patient was counseled for biopsy.  Procedure note: The area of the left buttock/perineum was cleansed with Betadine solution. 1% lidocaine without epinephrine was infiltrated subcutaneously for a total of 4 cc. An elliptical incision was made allow sufficient borders away from the visible lesion and this portion of tissue with the 2 lesions were excised completely and submitted for histological evaluation. The base of the dermis was cauterized silver nitrate and the skin edges were reapproximated with interrupted sutures of 2-0 Vicryl suture. Neosporin was placed along with a Band-Aid. Patient will return back to the office in 2 weeks remove the suture was notify with results of the pathology is interested becomes available.

## 2012-06-15 ENCOUNTER — Telehealth: Payer: Self-pay | Admitting: *Deleted

## 2012-06-15 ENCOUNTER — Ambulatory Visit (INDEPENDENT_AMBULATORY_CARE_PROVIDER_SITE_OTHER): Payer: Medicare Other | Admitting: Gynecology

## 2012-06-15 ENCOUNTER — Encounter: Payer: Self-pay | Admitting: Gynecology

## 2012-06-15 VITALS — BP 140/82

## 2012-06-15 DIAGNOSIS — Z09 Encounter for follow-up examination after completed treatment for conditions other than malignant neoplasm: Secondary | ICD-10-CM

## 2012-06-15 MED ORDER — DOXYCYCLINE HYCLATE 100 MG PO CAPS: 100.0000 mg | ORAL_CAPSULE | Freq: Two times a day (BID) | ORAL | Status: DC

## 2012-06-15 MED ORDER — FLUCONAZOLE 150 MG PO TABS: 150.0000 mg | ORAL_TABLET | Freq: Once | ORAL | Status: DC

## 2012-06-15 NOTE — Telephone Encounter (Signed)
Tell her that if she would like to come by this afternoon I can take a look at it.

## 2012-06-15 NOTE — Progress Notes (Signed)
Patient is a 66 year old who was seen the office on November 21st of this year for her annual gynecological examination. 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 Venancio Poisson dermatologist. During her pelvic examination she was found to have 2 hyperpigmented raised areas near the left buttock/perineum and was asked to return to the office which she did on December 4 and underwent a wide local excision of the area and the tissue submitted for histological evaluation with the following pathology report:  Skin , left buttock, perineum region, wide local excision SEBORRHEIC KERATOSIS, IRRITATED Microscopic Description There is hyperplasia of the epidermis with hyperkeratosis. There is an inflammatory infiltrate present containing primarily mononuclear cells, primarily lymphocytes. Epidermal cells appear somewhat enlarged due to increased cytoplasmic volume. Appreciable squamous atypia is not seen. These changes are those of irritated seborrheic keratosis.  She presented to the office today concerned that the area was weeping and thought that one of the sutures had come loose. On inspection one of the sutures did come loose was removed and the area was irrigated with hydrogen peroxide. Good healthy edges were noted and no evidence of infection was noted. Neosporin was applied and a Band-Aid. She will be started on Vibramycin 100 mg twice a day for 10 days more for prophylaxis for MRSA. Patient will do local debridement at home. She will return back to the office in approximately 10 days to remove the remaining sutures.

## 2012-06-15 NOTE — Telephone Encounter (Signed)
Left the below note on pt voicemail. 

## 2012-06-15 NOTE — Telephone Encounter (Signed)
Pt was seen on 06/07/12 and  found to have 2 hyperpigmented raised areas near the left buttock/perineum. Pt noticed last night  brownish  blood under the band-aid, no drainage, area is not red looks a little pink, no pain, just a little tender. Pt asked if this is normal? She has OV 06/21/12 to remove sutures. Please advise

## 2012-06-21 ENCOUNTER — Ambulatory Visit: Payer: Medicare Other | Admitting: Gynecology

## 2012-06-27 ENCOUNTER — Ambulatory Visit (INDEPENDENT_AMBULATORY_CARE_PROVIDER_SITE_OTHER): Payer: Medicare Other | Admitting: Gynecology

## 2012-06-27 ENCOUNTER — Encounter: Payer: Self-pay | Admitting: Gynecology

## 2012-06-27 DIAGNOSIS — Z9889 Other specified postprocedural states: Secondary | ICD-10-CM

## 2012-06-27 NOTE — Progress Notes (Signed)
Patient 66 year old who presented to the office for her final postop visit. Early in December during her annual gynecological exam she was found to have 2 hyperpigmented raised areas near the left buttock/perineum and was asked to return to the office which she did on December 4 and underwent a wide local excision of the area and the tissue submitted for histological evaluation with the following pathology report:   Skin , left buttock, perineum region, wide local excision  SEBORRHEIC KERATOSIS, IRRITATED  Microscopic Description  There is hyperplasia of the epidermis with hyperkeratosis. There is an inflammatory infiltrate present  containing primarily mononuclear cells, primarily lymphocytes. Epidermal cells appear somewhat enlarged  due to increased cytoplasmic volume. Appreciable squamous atypia is not seen. These changes are those of  irritated seborrheic keratosis.  Patient is doing well presented to the office to remove her suture.  The sutures were removed the areas completely healed she may return to full normal activity otherwise return back to the office next year for her annual exam or when necessary.

## 2013-04-12 ENCOUNTER — Other Ambulatory Visit: Payer: Self-pay | Admitting: Orthopedic Surgery

## 2013-04-17 ENCOUNTER — Encounter (HOSPITAL_COMMUNITY): Payer: Self-pay | Admitting: Pharmacy Technician

## 2013-04-22 ENCOUNTER — Other Ambulatory Visit: Payer: Self-pay | Admitting: Surgical

## 2013-04-22 NOTE — H&P (Signed)
TOTAL KNEE ADMISSION H&P  Patient is being admitted for left total knee arthroplasty.  Subjective:  Chief Complaint:left knee pain.  HPI: Maria Jackson, 67 y.o. female, has a history of pain and functional disability in the left knee due to arthritis and has failed non-surgical conservative treatments for greater than 12 weeks to includeNSAID's and/or analgesics, corticosteriod injections, viscosupplementation injections and activity modification.  Onset of symptoms was gradual, starting 8 years ago with gradually worsening course since that time. The patient noted no past surgery on the left knee(s).  Patient currently rates pain in the left knee(s) at 6 out of 10 with activity. Patient has night pain, worsening of pain with activity and weight bearing, pain that interferes with activities of daily living, pain with passive range of motion, crepitus and joint swelling.  Patient has evidence of periarticular osteophytes and joint space narrowing by imaging studies. There is no active infection.  Patient Active Problem List   Diagnosis Date Noted  . 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 Vaginal atrophy 05/25/2012  . Osteopenia 05/24/2011  . CONSTIPATION 08/27/2009  . ULCERATION OF INTESTINE 08/27/2009   Past Medical History  Diagnosis Date  . Allergy   . Arthritis     knees  . Osteopenia   . Raynaud disease   . Anxiety   . Depression   . Insomnia   . Spondylolisthesis     lumbar  . Cancer 11/05/11    SQUAMOS CELL CARCINOMA - LEFT CALF    Past Surgical History  Procedure Laterality Date  . Colonoscopy    . Tonsillectomy    . Cholecystectomy    . Appendectomy    . Vaginal hysterectomy      tah/bso  . Wisdom tooth extraction    . Foot neuroma surgery      bilateral  . Endoscopic vein laser treatment      calf riht and left     Current outpatient prescriptions: aspirin 81 MG tablet, Take 81 mg by mouth daily.  , Disp: , Rfl: ;   cetirizine (ZYRTEC) 10 MG  tablet, Take 10 mg by mouth daily., Disp: , Rfl: ;   cholecalciferol (VITAMIN D) 1000 UNITS tablet, Take 2,000 Units by mouth daily., Disp: , Rfl: ;   conjugated estrogens (PREMARIN) vaginal cream, Apply twice a week, Disp: 42.5 g, Rfl: 6 fluticasone (FLONASE) 50 MCG/ACT nasal spray, Place 1 spray into the nose Daily. , Disp: , Rfl: ;   hydroxypropyl methylcellulose (ISOPTO TEARS) 2.5 % ophthalmic solution, Place 1 drop into both eyes at bedtime., Disp: , Rfl: ;   Magnesium 250 MG TABS, Take 1 tablet by mouth daily.  , Disp: , Rfl: ;   Multiple Vitamin (MULTIVITAMIN WITH MINERALS) TABS tablet, Take 1 tablet by mouth daily., Disp: , Rfl:  Omega-3 Fatty Acids (FISH OIL) 1200 MG CAPS, Take 1 capsule by mouth daily.  , Disp: , Rfl: ;   Polyethylene Glycol 400 (BLINK TEARS OP), Place 1 drop into both eyes daily., Disp: , Rfl: ;   sodium chloride (OCEAN) 0.65 % nasal spray, Place 1 spray into the nose daily as needed for congestion., Disp: , Rfl:   Allergies  Allergen Reactions  . Cephalexin     Unsure of reaction  . Ibuprofen     Mouth ulcers  . Penicillins   . Sulfonamide Derivatives Rash    History  Substance Use Topics  . Smoking status: Never Smoker   . Smokeless tobacco: Never Used  .  Alcohol Use: 1.2 oz/week    2 Glasses of wine per week     Comment: red wine...  glass at dinner    Family History  Problem Relation Age of Onset  . Colon cancer Neg Hx   . Esophageal cancer Neg Hx   . Stomach cancer Neg Hx   . Diabetes Mother   . Heart disease Mother   . Heart disease Father   . Cancer Father 61    MULTIPLE MYELOMA     Review of Systems  Constitutional: Negative.   HENT: Positive for tinnitus. Negative for congestion, ear discharge, ear pain, hearing loss, nosebleeds and sore throat.   Eyes: Negative.   Respiratory: Negative.  Negative for stridor.   Cardiovascular: Negative.   Gastrointestinal: Positive for constipation. Negative for heartburn, nausea, vomiting, abdominal  pain, diarrhea, blood in stool and melena.  Genitourinary: Negative.   Musculoskeletal: Positive for back pain and joint pain. Negative for falls, myalgias and neck pain.       Left knee pain  Neurological: Negative.  Negative for headaches.  Endo/Heme/Allergies: Positive for environmental allergies. Negative for polydipsia. Does not bruise/bleed easily.  Psychiatric/Behavioral: Negative.     Objective:  Physical Exam  Constitutional: She is oriented to person, place, and time. She appears well-developed and well-nourished. No distress.  HENT:  Head: Normocephalic and atraumatic.  Right Ear: External ear normal.  Left Ear: External ear normal.  Nose: Nose normal.  Mouth/Throat: Oropharynx is clear and moist.  Eyes: Conjunctivae and EOM are normal.  Neck: Normal range of motion. Neck supple.  Cardiovascular: Normal rate, regular rhythm, normal heart sounds and intact distal pulses.   No murmur heard. Respiratory: Effort normal and breath sounds normal. No respiratory distress. She has no wheezes.  GI: Soft. Bowel sounds are normal. She exhibits no distension. There is no tenderness.  Musculoskeletal:       Right hip: Normal.       Left hip: Normal.       Right knee: Normal.       Left knee: She exhibits decreased range of motion and swelling. She exhibits no effusion and no erythema. Tenderness found. Medial joint line tenderness noted. No lateral joint line tenderness noted.       Right lower leg: She exhibits no tenderness and no swelling.       Left lower leg: She exhibits no tenderness and no swelling.  Her left knee shows no effusion. There is moderate crepitus on range of motion of the knee. Range about 5 to 130 with no instability noted.  Neurological: She is alert and oriented to person, place, and time. She has normal strength and normal reflexes. No sensory deficit.  Skin: No rash noted. She is not diaphoretic. No erythema.  Psychiatric: She has a normal mood and affect.  Her behavior is normal.    Vitals Weight: 145 lb Height: 62 in Body Surface Area: 1.7 m Body Mass Index: 26.52 kg/m Pulse: 64 (Regular) BP: 116/74 (Sitting, Left Arm, Standard)  Imaging Review Plain radiographs demonstrate severe degenerative joint disease of the left knee(s). The overall alignment ismild varus. The bone quality appears to be adequate for age and reported activity level.  Assessment/Plan:  End stage arthritis, left knee   The patient history, physical examination, clinical judgment of the provider and imaging studies are consistent with end stage degenerative joint disease of the left knee(s) and total knee arthroplasty is deemed medically necessary. The treatment options including medical management, injection therapy  arthroscopy and arthroplasty were discussed at length. The risks and benefits of total knee arthroplasty were presented and reviewed. The risks due to aseptic loosening, infection, stiffness, patella tracking problems, thromboembolic complications and other imponderables were discussed. The patient acknowledged the explanation, agreed to proceed with the plan and consent was signed. Patient is being admitted for inpatient treatment for surgery, pain control, PT, OT, prophylactic antibiotics, VTE prophylaxis, progressive ambulation and ADL's and discharge planning. The patient is planning to be discharged home with home health services   Patient to receive TXA    Dimitri Ped, PA-C

## 2013-04-23 ENCOUNTER — Encounter (HOSPITAL_COMMUNITY)
Admission: RE | Admit: 2013-04-23 | Discharge: 2013-04-23 | Disposition: A | Discharge status: Home / Self Care | Payer: Medicare Other | Source: Ambulatory Visit | Attending: Orthopedic Surgery | Admitting: Orthopedic Surgery

## 2013-04-23 ENCOUNTER — Encounter (HOSPITAL_COMMUNITY): Payer: Self-pay

## 2013-04-23 DIAGNOSIS — Z01812 Encounter for preprocedural laboratory examination: Secondary | ICD-10-CM | POA: Insufficient documentation

## 2013-04-23 HISTORY — DX: Other seasonal allergic rhinitis: J30.2

## 2013-04-23 LAB — CBC
HCT: 40 % (ref 36.0–46.0)
MCH: 33.8 pg (ref 26.0–34.0)
MCV: 100.3 fL — ABNORMAL HIGH (ref 78.0–100.0)
Platelets: 414 10*3/uL — ABNORMAL HIGH (ref 150–400)
RBC: 3.99 MIL/uL (ref 3.87–5.11)
RDW: 12.9 % (ref 11.5–15.5)
WBC: 5 10*3/uL (ref 4.0–10.5)

## 2013-04-23 LAB — COMPREHENSIVE METABOLIC PANEL
AST: 25 U/L (ref 0–37)
Albumin: 3.9 g/dL (ref 3.5–5.2)
Calcium: 10.2 mg/dL (ref 8.4–10.5)
Chloride: 97 mEq/L (ref 96–112)
Creatinine, Ser: 0.7 mg/dL (ref 0.50–1.10)
GFR calc non Af Amer: 86 mL/min — ABNORMAL LOW (ref >90)
Glucose, Bld: 81 mg/dL (ref 70–99)
Potassium: 5.2 mEq/L — ABNORMAL HIGH (ref 3.5–5.1)
Total Bilirubin: 0.7 mg/dL (ref 0.3–1.2)

## 2013-04-23 LAB — URINALYSIS, ROUTINE W REFLEX MICROSCOPIC
Bilirubin Urine: NEGATIVE
Glucose, UA: NEGATIVE mg/dL
Ketones, ur: NEGATIVE mg/dL
Leukocytes, UA: NEGATIVE
Protein, ur: NEGATIVE mg/dL

## 2013-04-23 LAB — ABO/RH: ABO/RH(D): A POS

## 2013-04-23 LAB — SURGICAL PCR SCREEN: MRSA, PCR: NEGATIVE

## 2013-04-23 LAB — PROTIME-INR
INR: 0.95 (ref 0.00–1.49)
Prothrombin Time: 12.5 seconds (ref 11.6–15.2)

## 2013-04-23 NOTE — Pre-Procedure Instructions (Addendum)
EKG AND CXR NOT DONE - NOT REQUIRED PER ANESTHESIOLOGIST'S GUIDELINES. MEDICAL CLEARANCE FOR SURGERY ON CHART FROM DR. SHAW.

## 2013-04-23 NOTE — Patient Instructions (Addendum)
YOUR SURGERY IS SCHEDULED AT [facility]  ON:  Friday  10/24  REPORT TO [facility] AT:  5:15 AM      PHONE # FOR SHORT STAY IS [phone]  DO NOT EAT OR DRINK ANYTHING AFTER MIDNIGHT THE NIGHT BEFORE YOUR SURGERY.  YOU MAY BRUSH YOUR TEETH, RINSE OUT YOUR MOUTH--BUT NO WATER, NO FOOD, NO CHEWING GUM, NO MINTS, NO CANDIES, NO CHEWING TOBACCO.  PLEASE TAKE THE FOLLOWING MEDICATIONS THE AM OF YOUR SURGERY WITH A FEW SIPS OF WATER:  NO MEDS TO TAKE - MAY USE ANY EYE DROPS NEEDED AND BRING EYE DROPS TO HOSPITAL    DO NOT BRING VALUABLES, MONEY, CREDIT CARDS.  DO NOT WEAR JEWELRY, MAKE-UP, NAIL POLISH AND NO METAL PINS OR CLIPS IN YOUR HAIR. CONTACT LENS, DENTURES / PARTIALS, GLASSES SHOULD NOT BE WORN TO SURGERY AND IN MOST CASES-HEARING AIDS WILL NEED TO BE REMOVED.  BRING YOUR GLASSES CASE, ANY EQUIPMENT NEEDED FOR YOUR CONTACT LENS. FOR PATIENTS ADMITTED TO THE HOSPITAL--CHECK OUT TIME THE DAY OF DISCHARGE IS 11:00 AM.  ALL INPATIENT ROOMS ARE PRIVATE - WITH BATHROOM, TELEPHONE, TELEVISION AND WIFI INTERNET.                             PLEASE READ OVER ANY  FACT SHEETS THAT YOU WERE GIVEN: MRSA INFORMATION, BLOOD TRANSFUSION INFORMATION, INCENTIVE SPIROMETER INFORMATION. FAILURE TO FOLLOW THESE INSTRUCTIONS MAY RESULT IN THE CANCELLATION OF YOUR SURGERY.   PATIENT SIGNATURE_________________________________

## 2013-04-26 NOTE — Anesthesia Preprocedure Evaluation (Addendum)
Anesthesia Evaluation  Patient identified by MRN, date of birth, ID band Patient awake    Reviewed: Allergy & Precautions, H&P , NPO status , Patient's Chart, lab work & pertinent test results  Airway Mallampati: II TM Distance: >3 FB Neck ROM: full    Dental  (+) Dental Advisory Given Veneers front upper and lower:   Pulmonary neg pulmonary ROS,  breath sounds clear to auscultation  Pulmonary exam normal       Cardiovascular Exercise Tolerance: Good negative cardio ROS  Rhythm:regular Rate:Normal     Neuro/Psych Raynaud's disease. Spondylolisthesis negative neurological ROS  negative psych ROS   GI/Hepatic negative GI ROS, Neg liver ROS,   Endo/Other  negative endocrine ROS  Renal/GU negative Renal ROS  negative genitourinary   Musculoskeletal   Abdominal   Peds  Hematology negative hematology ROS (+)   Anesthesia Other Findings   Reproductive/Obstetrics negative OB ROS                          Anesthesia Physical Anesthesia Plan  ASA: II  Anesthesia Plan:    Post-op Pain Management:    Induction:   Airway Management Planned:   Additional Equipment:   Intra-op Plan:   Post-operative Plan:   Informed Consent: I have reviewed the patients History and Physical, chart, labs and discussed the procedure including the risks, benefits and alternatives for the proposed anesthesia with the patient or authorized representative who has indicated his/her understanding and acceptance.   Dental Advisory Given  Plan Discussed with: CRNA and Surgeon  Anesthesia Plan Comments:         Anesthesia Quick Evaluation

## 2013-04-27 ENCOUNTER — Encounter (HOSPITAL_COMMUNITY): Payer: Self-pay | Admitting: *Deleted

## 2013-04-27 ENCOUNTER — Encounter (HOSPITAL_COMMUNITY): Payer: Medicare Other | Admitting: Anesthesiology

## 2013-04-27 ENCOUNTER — Inpatient Hospital Stay (HOSPITAL_COMMUNITY)
Admission: RE | Admit: 2013-04-27 | Discharge: 2013-04-30 | DRG: 470 | Disposition: A | Discharge status: Home Health | Payer: Medicare Other | Source: Ambulatory Visit | Attending: Orthopedic Surgery | Admitting: Orthopedic Surgery

## 2013-04-27 ENCOUNTER — Ambulatory Visit (HOSPITAL_COMMUNITY): Payer: Medicare Other | Admitting: Anesthesiology

## 2013-04-27 ENCOUNTER — Encounter (HOSPITAL_COMMUNITY)
Admission: RE | Disposition: A | Discharge status: Home Health | Payer: Self-pay | Source: Ambulatory Visit | Attending: Orthopedic Surgery

## 2013-04-27 DIAGNOSIS — Z96652 Presence of left artificial knee joint: Secondary | ICD-10-CM

## 2013-04-27 DIAGNOSIS — Z01812 Encounter for preprocedural laboratory examination: Secondary | ICD-10-CM

## 2013-04-27 DIAGNOSIS — K5900 Constipation, unspecified: Secondary | ICD-10-CM | POA: Diagnosis present

## 2013-04-27 DIAGNOSIS — M1710 Unilateral primary osteoarthritis, unspecified knee: Principal | ICD-10-CM | POA: Diagnosis present

## 2013-04-27 DIAGNOSIS — R5381 Other malaise: Secondary | ICD-10-CM | POA: Diagnosis not present

## 2013-04-27 DIAGNOSIS — Z6827 Body mass index (BMI) 27.0-27.9, adult: Secondary | ICD-10-CM

## 2013-04-27 DIAGNOSIS — M179 Osteoarthritis of knee, unspecified: Secondary | ICD-10-CM

## 2013-04-27 DIAGNOSIS — I7300 Raynaud's syndrome without gangrene: Secondary | ICD-10-CM | POA: Diagnosis present

## 2013-04-27 HISTORY — PX: TOTAL KNEE ARTHROPLASTY: SHX125

## 2013-04-27 LAB — TYPE AND SCREEN: ABO/RH(D): A POS

## 2013-04-27 SURGERY — ARTHROPLASTY, KNEE, TOTAL
Anesthesia: Spinal | Site: Knee | Laterality: Left | Wound class: Clean

## 2013-04-27 MED ORDER — DEXAMETHASONE 4 MG PO TABS
10.0000 mg | ORAL_TABLET | Freq: Every day | ORAL | Status: AC
  Administered 2013-04-28: 10 mg via ORAL
  Filled 2013-04-27: qty 1

## 2013-04-27 MED ORDER — PROPOFOL INFUSION 10 MG/ML OPTIME
INTRAVENOUS | Status: DC | PRN
  Administered 2013-04-27: 75 ug/kg/min via INTRAVENOUS

## 2013-04-27 MED ORDER — PHENOL 1.4 % MT LIQD: 1.0000 | OROMUCOSAL | Status: DC | PRN

## 2013-04-27 MED ORDER — KETAMINE HCL 10 MG/ML IJ SOLN
INTRAMUSCULAR | Status: DC | PRN
  Administered 2013-04-27 (×2): 10 mg via INTRAVENOUS

## 2013-04-27 MED ORDER — ACETAMINOPHEN 500 MG PO TABS
1000.0000 mg | ORAL_TABLET | Freq: Once | ORAL | Status: AC
  Administered 2013-04-27: 1000 mg via ORAL
  Filled 2013-04-27: qty 2

## 2013-04-27 MED ORDER — ACETAMINOPHEN 650 MG RE SUPP: 650.0000 mg | Freq: Four times a day (QID) | RECTAL | Status: DC | PRN

## 2013-04-27 MED ORDER — BUPIVACAINE LIPOSOME 1.3 % IJ SUSP
INTRAMUSCULAR | Status: DC | PRN
  Administered 2013-04-27: 20 mL

## 2013-04-27 MED ORDER — BISACODYL 10 MG RE SUPP: 10.0000 mg | Freq: Every day | RECTAL | Status: DC | PRN

## 2013-04-27 MED ORDER — FLEET ENEMA 7-19 GM/118ML RE ENEM: 1.0000 | ENEMA | Freq: Once | RECTAL | Status: AC | PRN

## 2013-04-27 MED ORDER — MENTHOL 3 MG MT LOZG: 1.0000 | LOZENGE | OROMUCOSAL | Status: DC | PRN

## 2013-04-27 MED ORDER — LACTATED RINGERS IV SOLN
INTRAVENOUS | Status: DC | PRN
  Administered 2013-04-27 (×2): via INTRAVENOUS

## 2013-04-27 MED ORDER — OXYCODONE HCL 5 MG PO TABS
5.0000 mg | ORAL_TABLET | ORAL | Status: DC | PRN
  Administered 2013-04-27 – 2013-04-30 (×21): 10 mg via ORAL
  Filled 2013-04-27 (×3): qty 2
  Filled 2013-04-27: qty 1
  Filled 2013-04-27 (×9): qty 2
  Filled 2013-04-27: qty 1
  Filled 2013-04-27 (×9): qty 2

## 2013-04-27 MED ORDER — EPHEDRINE SULFATE 50 MG/ML IJ SOLN
INTRAMUSCULAR | Status: DC | PRN
  Administered 2013-04-27: 10 mg via INTRAVENOUS
  Administered 2013-04-27: 5 mg via INTRAVENOUS
  Administered 2013-04-27: 10 mg via INTRAVENOUS

## 2013-04-27 MED ORDER — SODIUM CHLORIDE 0.9 % IR SOLN
Status: DC | PRN
  Administered 2013-04-27: 1000 mL

## 2013-04-27 MED ORDER — LORATADINE 10 MG PO TABS
10.0000 mg | ORAL_TABLET | Freq: Every day | ORAL | Status: DC
  Administered 2013-04-27 – 2013-04-29 (×3): 10 mg via ORAL
  Filled 2013-04-27 (×4): qty 1

## 2013-04-27 MED ORDER — ONDANSETRON HCL 4 MG/2ML IJ SOLN
4.0000 mg | Freq: Four times a day (QID) | INTRAMUSCULAR | Status: DC | PRN
  Administered 2013-04-28: 4 mg via INTRAVENOUS
  Filled 2013-04-27: qty 2

## 2013-04-27 MED ORDER — BUPIVACAINE HCL (PF) 0.75 % IJ SOLN
INTRAMUSCULAR | Status: DC | PRN
  Administered 2013-04-27: 15 mg via INTRATHECAL

## 2013-04-27 MED ORDER — HYDROMORPHONE HCL PF 1 MG/ML IJ SOLN: 0.2500 mg | INTRAMUSCULAR | Status: DC | PRN

## 2013-04-27 MED ORDER — FLUTICASONE PROPIONATE 50 MCG/ACT NA SUSP
1.0000 | Freq: Every day | NASAL | Status: DC
  Administered 2013-04-28 – 2013-04-29 (×2): 1 via NASAL
  Filled 2013-04-27: qty 16

## 2013-04-27 MED ORDER — DIPHENHYDRAMINE HCL 12.5 MG/5ML PO ELIX
12.5000 mg | ORAL_SOLUTION | ORAL | Status: DC | PRN
  Administered 2013-04-28: 12.5 mg via ORAL
  Filled 2013-04-27: qty 5

## 2013-04-27 MED ORDER — ONDANSETRON HCL 4 MG PO TABS: 4.0000 mg | ORAL_TABLET | Freq: Four times a day (QID) | ORAL | Status: DC | PRN

## 2013-04-27 MED ORDER — POLYETHYLENE GLYCOL 3350 17 G PO PACK: 17.0000 g | PACK | Freq: Every day | ORAL | Status: DC | PRN

## 2013-04-27 MED ORDER — LACTATED RINGERS IV SOLN: INTRAVENOUS | Status: DC

## 2013-04-27 MED ORDER — DEXAMETHASONE SODIUM PHOSPHATE 10 MG/ML IJ SOLN
10.0000 mg | Freq: Every day | INTRAMUSCULAR | Status: AC
  Filled 2013-04-27: qty 1

## 2013-04-27 MED ORDER — METHOCARBAMOL 500 MG PO TABS
500.0000 mg | ORAL_TABLET | Freq: Four times a day (QID) | ORAL | Status: DC | PRN
  Administered 2013-04-29 – 2013-04-30 (×3): 500 mg via ORAL
  Filled 2013-04-27 (×3): qty 1

## 2013-04-27 MED ORDER — DOCUSATE SODIUM 100 MG PO CAPS
100.0000 mg | ORAL_CAPSULE | Freq: Two times a day (BID) | ORAL | Status: DC
  Administered 2013-04-27 – 2013-04-30 (×6): 100 mg via ORAL

## 2013-04-27 MED ORDER — VANCOMYCIN HCL IN DEXTROSE 1-5 GM/200ML-% IV SOLN
INTRAVENOUS | Status: AC
  Filled 2013-04-27: qty 200

## 2013-04-27 MED ORDER — FENTANYL CITRATE 0.05 MG/ML IJ SOLN
INTRAMUSCULAR | Status: DC | PRN
  Administered 2013-04-27: 100 ug via INTRAVENOUS

## 2013-04-27 MED ORDER — SODIUM CHLORIDE 0.9 % IJ SOLN
INTRAMUSCULAR | Status: AC
  Filled 2013-04-27: qty 50

## 2013-04-27 MED ORDER — VANCOMYCIN HCL IN DEXTROSE 1-5 GM/200ML-% IV SOLN
1000.0000 mg | INTRAVENOUS | Status: AC
  Administered 2013-04-27: 1000 mg via INTRAVENOUS

## 2013-04-27 MED ORDER — METOCLOPRAMIDE HCL 10 MG PO TABS: 5.0000 mg | ORAL_TABLET | Freq: Three times a day (TID) | ORAL | Status: DC | PRN

## 2013-04-27 MED ORDER — DEXAMETHASONE SODIUM PHOSPHATE 10 MG/ML IJ SOLN
10.0000 mg | Freq: Once | INTRAMUSCULAR | Status: AC
  Administered 2013-04-27: 10 mg via INTRAVENOUS

## 2013-04-27 MED ORDER — POLYVINYL ALCOHOL 1.4 % OP SOLN
1.0000 [drp] | Freq: Every day | OPHTHALMIC | Status: DC
  Administered 2013-04-28 – 2013-04-29 (×2): 1 [drp] via OPHTHALMIC
  Filled 2013-04-27: qty 15

## 2013-04-27 MED ORDER — CHLORHEXIDINE GLUCONATE 4 % EX LIQD: 60.0000 mL | Freq: Once | CUTANEOUS | Status: DC

## 2013-04-27 MED ORDER — METHOCARBAMOL 100 MG/ML IJ SOLN
500.0000 mg | Freq: Four times a day (QID) | INTRAVENOUS | Status: DC | PRN
  Administered 2013-04-27: 500 mg via INTRAVENOUS
  Filled 2013-04-27 (×2): qty 5

## 2013-04-27 MED ORDER — MIDAZOLAM HCL 5 MG/5ML IJ SOLN
INTRAMUSCULAR | Status: DC | PRN
  Administered 2013-04-27 (×2): 1 mg via INTRAVENOUS

## 2013-04-27 MED ORDER — BUPIVACAINE HCL (PF) 0.25 % IJ SOLN
INTRAMUSCULAR | Status: AC
  Filled 2013-04-27: qty 30

## 2013-04-27 MED ORDER — BUPIVACAINE HCL 0.25 % IJ SOLN
INTRAMUSCULAR | Status: DC | PRN
  Administered 2013-04-27: 20 mL

## 2013-04-27 MED ORDER — VANCOMYCIN HCL IN DEXTROSE 1-5 GM/200ML-% IV SOLN
1000.0000 mg | Freq: Two times a day (BID) | INTRAVENOUS | Status: AC
  Administered 2013-04-27: 1000 mg via INTRAVENOUS
  Filled 2013-04-27: qty 200

## 2013-04-27 MED ORDER — PROPOFOL 10 MG/ML IV BOLUS
INTRAVENOUS | Status: DC | PRN
  Administered 2013-04-27: 20 mg via INTRAVENOUS

## 2013-04-27 MED ORDER — METOCLOPRAMIDE HCL 5 MG/ML IJ SOLN
5.0000 mg | Freq: Three times a day (TID) | INTRAMUSCULAR | Status: DC | PRN
  Administered 2013-04-27 – 2013-04-28 (×2): 10 mg via INTRAVENOUS
  Filled 2013-04-27 (×2): qty 2

## 2013-04-27 MED ORDER — HYDROMORPHONE HCL PF 1 MG/ML IJ SOLN
0.5000 mg | INTRAMUSCULAR | Status: DC | PRN
  Administered 2013-04-27: 0.5 mg via INTRAVENOUS
  Filled 2013-04-27: qty 1

## 2013-04-27 MED ORDER — ACETAMINOPHEN 500 MG PO TABS
1000.0000 mg | ORAL_TABLET | Freq: Four times a day (QID) | ORAL | Status: AC
  Administered 2013-04-27 – 2013-04-28 (×4): 1000 mg via ORAL
  Filled 2013-04-27 (×5): qty 2

## 2013-04-27 MED ORDER — TRANEXAMIC ACID 100 MG/ML IV SOLN
1000.0000 mg | INTRAVENOUS | Status: AC
  Administered 2013-04-27: 1000 mg via INTRAVENOUS
  Filled 2013-04-27: qty 10

## 2013-04-27 MED ORDER — RIVAROXABAN 10 MG PO TABS
10.0000 mg | ORAL_TABLET | Freq: Every day | ORAL | Status: DC
  Administered 2013-04-28 – 2013-04-30 (×3): 10 mg via ORAL
  Filled 2013-04-27 (×5): qty 1

## 2013-04-27 MED ORDER — DEXTROSE-NACL 5-0.9 % IV SOLN
INTRAVENOUS | Status: DC
  Administered 2013-04-27 – 2013-04-28 (×2): via INTRAVENOUS

## 2013-04-27 MED ORDER — 0.9 % SODIUM CHLORIDE (POUR BTL) OPTIME
TOPICAL | Status: DC | PRN
  Administered 2013-04-27: 250 mL

## 2013-04-27 MED ORDER — ACETAMINOPHEN 325 MG PO TABS: 650.0000 mg | ORAL_TABLET | Freq: Four times a day (QID) | ORAL | Status: DC | PRN

## 2013-04-27 MED ORDER — HYPROMELLOSE (GONIOSCOPIC) 2.5 % OP SOLN: 1.0000 [drp] | Freq: Every day | OPHTHALMIC | Status: DC

## 2013-04-27 MED ORDER — SODIUM CHLORIDE 0.9 % IV SOLN: INTRAVENOUS | Status: DC

## 2013-04-27 MED ORDER — TRAMADOL HCL 50 MG PO TABS: 50.0000 mg | ORAL_TABLET | Freq: Four times a day (QID) | ORAL | Status: DC | PRN

## 2013-04-27 MED ORDER — BUPIVACAINE LIPOSOME 1.3 % IJ SUSP
20.0000 mL | Freq: Once | INTRAMUSCULAR | Status: DC
  Filled 2013-04-27: qty 20

## 2013-04-27 SURGICAL SUPPLY — 57 items
BAG SPEC THK2 15X12 ZIP CLS (MISCELLANEOUS) ×1
BAG ZIPLOCK 12X15 (MISCELLANEOUS) ×2 IMPLANT
BANDAGE ELASTIC 6 VELCRO ST LF (GAUZE/BANDAGES/DRESSINGS) ×2 IMPLANT
BANDAGE ESMARK 6X9 LF (GAUZE/BANDAGES/DRESSINGS) ×1 IMPLANT
BLADE SAG 18X100X1.27 (BLADE) ×2 IMPLANT
BLADE SAW SGTL 11.0X1.19X90.0M (BLADE) ×2 IMPLANT
BNDG CMPR 9X6 STRL LF SNTH (GAUZE/BANDAGES/DRESSINGS) ×1
BNDG ESMARK 6X9 LF (GAUZE/BANDAGES/DRESSINGS) ×2
BOWL SMART MIX CTS (DISPOSABLE) ×2 IMPLANT
CAPT RP KNEE ×2 IMPLANT
CEMENT HV SMART SET (Cement) ×4 IMPLANT
CLOTH BEACON ORANGE TIMEOUT ST (SAFETY) ×1 IMPLANT
CUFF TOURN SGL QUICK 34 (TOURNIQUET CUFF) ×2
CUFF TRNQT CYL 34X4X40X1 (TOURNIQUET CUFF) ×1 IMPLANT
DECANTER SPIKE VIAL GLASS SM (MISCELLANEOUS) ×2 IMPLANT
DRAPE EXTREMITY T 121X128X90 (DRAPE) ×2 IMPLANT
DRAPE POUCH INSTRU U-SHP 10X18 (DRAPES) ×2 IMPLANT
DRAPE U-SHAPE 47X51 STRL (DRAPES) ×2 IMPLANT
DRSG ADAPTIC 3X8 NADH LF (GAUZE/BANDAGES/DRESSINGS) ×2 IMPLANT
DRSG PAD ABDOMINAL 8X10 ST (GAUZE/BANDAGES/DRESSINGS) ×2 IMPLANT
DURAPREP 26ML APPLICATOR (WOUND CARE) ×2 IMPLANT
ELECT REM PT RETURN 9FT ADLT (ELECTROSURGICAL) ×2
ELECTRODE REM PT RTRN 9FT ADLT (ELECTROSURGICAL) ×1 IMPLANT
EVACUATOR 1/8 PVC DRAIN (DRAIN) ×2 IMPLANT
FACESHIELD LNG OPTICON STERILE (SAFETY) ×10 IMPLANT
GLOVE BIO SURGEON STRL SZ7.5 (GLOVE) ×2 IMPLANT
GLOVE BIO SURGEON STRL SZ8 (GLOVE) ×2 IMPLANT
GLOVE BIOGEL PI IND STRL 8 (GLOVE) ×2 IMPLANT
GLOVE BIOGEL PI INDICATOR 8 (GLOVE) ×2
GLOVE SURG SS PI 6.5 STRL IVOR (GLOVE) IMPLANT
GOWN PREVENTION PLUS LG XLONG (DISPOSABLE) ×2 IMPLANT
GOWN STRL REIN XL XLG (GOWN DISPOSABLE) IMPLANT
HANDPIECE INTERPULSE COAX TIP (DISPOSABLE) ×2
IMMOBILIZER KNEE 20 (SOFTGOODS) ×2
IMMOBILIZER KNEE 20 THIGH 36 (SOFTGOODS) ×1 IMPLANT
KIT BASIN OR (CUSTOM PROCEDURE TRAY) ×2 IMPLANT
MANIFOLD NEPTUNE II (INSTRUMENTS) ×2 IMPLANT
NDL SAFETY ECLIPSE 18X1.5 (NEEDLE) ×2 IMPLANT
NEEDLE HYPO 18GX1.5 SHARP (NEEDLE) ×4
NS IRRIG 1000ML POUR BTL (IV SOLUTION) ×2 IMPLANT
PACK TOTAL JOINT (CUSTOM PROCEDURE TRAY) ×2 IMPLANT
PADDING CAST COTTON 6X4 STRL (CAST SUPPLIES) ×4 IMPLANT
POSITIONER SURGICAL ARM (MISCELLANEOUS) ×2 IMPLANT
SET HNDPC FAN SPRY TIP SCT (DISPOSABLE) ×1 IMPLANT
SPONGE GAUZE 4X4 12PLY (GAUZE/BANDAGES/DRESSINGS) ×2 IMPLANT
STRIP CLOSURE SKIN 1/2X4 (GAUZE/BANDAGES/DRESSINGS) ×3 IMPLANT
SUCTION FRAZIER 12FR DISP (SUCTIONS) ×2 IMPLANT
SUT MNCRL AB 4-0 PS2 18 (SUTURE) ×2 IMPLANT
SUT VIC AB 2-0 CT1 27 (SUTURE) ×6
SUT VIC AB 2-0 CT1 TAPERPNT 27 (SUTURE) ×3 IMPLANT
SUT VLOC 180 0 24IN GS25 (SUTURE) ×2 IMPLANT
SYR 20CC LL (SYRINGE) ×2 IMPLANT
SYR 50ML LL SCALE MARK (SYRINGE) ×2 IMPLANT
TOWEL OR 17X26 10 PK STRL BLUE (TOWEL DISPOSABLE) ×4 IMPLANT
TRAY FOLEY CATH 14FRSI W/METER (CATHETERS) ×2 IMPLANT
WATER STERILE IRR 1500ML POUR (IV SOLUTION) ×2 IMPLANT
WRAP KNEE MAXI GEL POST OP (GAUZE/BANDAGES/DRESSINGS) ×2 IMPLANT

## 2013-04-27 NOTE — Transfer of Care (Signed)
Immediate Anesthesia Transfer of Care Note  Patient: Maria Jackson  Procedure(s) Performed: Procedure(s): LEFT TOTAL KNEE ARTHROPLASTY (Left)  Patient Location: PACU  Anesthesia Type:Spinal  Level of Consciousness: awake, alert , oriented and patient cooperative  Airway & Oxygen Therapy: Patient Spontanous Breathing and Patient connected to face mask oxygen  Post-op Assessment: Report given to PACU RN, Post -op Vital signs reviewed and stable and SAB level T12.  Post vital signs: Reviewed and stable  Complications: No apparent anesthesia complications

## 2013-04-27 NOTE — Interval H&P Note (Signed)
History and Physical Interval Note:  04/27/2013 7:13 AM  Maria Jackson  has presented today for surgery, with the diagnosis of OSTEOARTHRITIS LEFT KNEE  The various methods of treatment have been discussed with the patient and family. After consideration of risks, benefits and other options for treatment, the patient has consented to  Procedure(s): LEFT TOTAL KNEE ARTHROPLASTY (Left) as a surgical intervention .  The patient's history has been reviewed, patient examined, no change in status, stable for surgery.  I have reviewed the patient's chart and labs.  Questions were answered to the patient's satisfaction.     Loanne Drilling

## 2013-04-27 NOTE — Anesthesia Postprocedure Evaluation (Signed)
  Anesthesia Post-op Note  Patient: Maria Jackson  Procedure(s) Performed: Procedure(s) (LRB): LEFT TOTAL KNEE ARTHROPLASTY (Left)  Patient Location: PACU  Anesthesia Type: Spinal  Level of Consciousness: awake and alert   Airway and Oxygen Therapy: Patient Spontanous Breathing  Post-op Pain: mild  Post-op Assessment: Post-op Vital signs reviewed, Patient's Cardiovascular Status Stable, Respiratory Function Stable, Patent Airway and No signs of Nausea or vomiting  Last Vitals:  Filed Vitals:   04/27/13 0915  BP: 116/52  Pulse: 61  Temp:   Resp: 13    Post-op Vital Signs: stable   Complications: No apparent anesthesia complications

## 2013-04-27 NOTE — Evaluation (Signed)
Physical Therapy Evaluation Patient Details Name: Maria Jackson MRN: [id number] DOB: 08/07/1945 Today's Date: 04/27/2013 Time: 8469-6295 PT Time Calculation (min): 38 min  PT Assessment / Plan / Recommendation History of Present Illness     Clinical Impression  Pt s/p L TKR presents with decreased L LE strength/ROM and post op pain limiting functional mobility.  Pt should progress well to d/c home with family assist and HHPT follow up.    PT Assessment  Patient needs continued PT services    Follow Up Recommendations  Home health PT    Does the patient have the potential to tolerate intense rehabilitation      Barriers to Discharge        Equipment Recommendations  None recommended by PT    Recommendations for Other Services OT consult   Frequency 7X/week    Precautions / Restrictions Precautions Precautions: Knee;Fall Required Braces or Orthoses: Knee Immobilizer - Left Knee Immobilizer - Left: Discontinue once straight leg raise with < 10 degree lag Restrictions Weight Bearing Restrictions: No Other Position/Activity Restrictions: WBAT   Pertinent Vitals/Pain 4/10, premed, ice packs provided      Mobility  Bed Mobility Bed Mobility: Supine to Sit Supine to Sit: 4: Min assist;3: Mod assist Details for Bed Mobility Assistance: cues for sequence and use of R LE to self assist Transfers Transfers: Sit to Stand;Stand to Sit Sit to Stand: 4: Min assist;3: Mod assist;With upper extremity assist;From bed Stand to Sit: 4: Min assist;3: Mod assist;To bed;To chair/3-in-1;With armrests;With upper extremity assist Details for Transfer Assistance: cues for LE management and use of UEs to self assist Ambulation/Gait Ambulation/Gait Assistance: 4: Min assist;3: Mod assist Ambulation Distance (Feet): 12 Feet Assistive device: Rolling walker Ambulation/Gait Assistance Details: cues for sequence, posture, position from RW Gait Pattern: Step-to pattern;Decreased step length  - right;Decreased step length - left;Antalgic;Trunk flexed;Shuffle General Gait Details: ltd by onset of "feeling hot and kind of funny"  BP 110/64    Exercises Total Joint Exercises Ankle Circles/Pumps: AROM;10 reps;Supine;Both Quad Sets: AROM;Both;10 reps;Supine Heel Slides: AAROM;10 reps;Supine;Left Straight Leg Raises: AAROM;Left;10 reps;Supine   PT Diagnosis: Difficulty walking  PT Problem List: Decreased strength;Decreased range of motion;Decreased activity tolerance;Decreased mobility;Decreased knowledge of use of DME;Pain;Decreased knowledge of precautions PT Treatment Interventions: DME instruction;Gait training;Stair training;Functional mobility training;Therapeutic activities;Therapeutic exercise;Patient/family education     PT Goals(Current goals can be found in the care plan section) Acute Rehab PT Goals Patient Stated Goal: Resume previous lifestyle with decreased pain PT Goal Formulation: With patient Time For Goal Achievement: 05/04/13 Potential to Achieve Goals: Good  Visit Information  Last PT Received On: 04/27/13 Assistance Needed: +1       Prior Functioning  Home Living Family/patient expects to be discharged to:: Private residence Living Arrangements: Spouse/significant other Available Help at Discharge: Family Type of Home: House Home Access: Stairs to enter Secretary/administrator of Steps: 4 Entrance Stairs-Rails: Right;Left Home Layout: Able to live on main level with bedroom/bathroom Home Equipment: Walker - 2 wheels Prior Function Level of Independence: Independent;Independent with assistive device(s) Communication Communication: No difficulties    Cognition  Cognition Arousal/Alertness: Awake/alert Behavior During Therapy: WFL for tasks assessed/performed Overall Cognitive Status: Within Functional Limits for tasks assessed    Extremity/Trunk Assessment Upper Extremity Assessment Upper Extremity Assessment: Overall WFL for tasks  assessed Lower Extremity Assessment Lower Extremity Assessment: LLE deficits/detail LLE Deficits / Details: Quad strength 2+/5 with AAROM at knee -10 - 40   Balance    End of Session PT -  End of Session Equipment Utilized During Treatment: Gait belt;Left knee immobilizer Activity Tolerance: Patient limited by fatigue;Other (comment) Patient left: in chair;with call bell/phone within reach;with family/visitor present Nurse Communication: Mobility status CPM Left Knee CPM Left Knee: Off  GP     Lahari Suttles 04/27/2013, 4:26 PM

## 2013-04-27 NOTE — Progress Notes (Signed)
Utilization review completed.  

## 2013-04-27 NOTE — Plan of Care (Signed)
Problem: Consults Goal: Diagnosis- Total Joint Replacement Left total knee     

## 2013-04-27 NOTE — Op Note (Signed)
Pre-operative diagnosis- Osteoarthritis  Left knee(s)  Post-operative diagnosis- Osteoarthritis Left knee(s)  Procedure-  Left  Total Knee Arthroplasty  Surgeon- Gus Rankin. Coby Shrewsberry, MD  Assistant- Avel Peace, PA-C   Anesthesia-  Spinal EBL-* No blood loss amount entered *  Drains Hemovac  Tourniquet time-  Total Tourniquet Time Documented: Thigh (Left) - 28 minutes Total: Thigh (Left) - 28 minutes    Complications- None  Condition-PACU - hemodynamically stable.   Brief Clinical Note  Maria Jackson is a 67 y.o. year old female with end stage OA of her left knee with progressively worsening pain and dysfunction. She has constant pain, with activity and at rest and significant functional deficits with difficulties even with ADLs. She has had extensive non-op management including analgesics, injections of cortisone and viscosupplements, and home exercise program, but remains in significant pain with significant dysfunction. Radiographs show bone on bone arthritis lateral and patellofemoral. She presents now for left Total Knee Arthroplasty.    Procedure in detail---   The patient is brought into the operating room and positioned supine on the operating table. After successful administration of  Spinal,   a tourniquet is placed high on the  Left thigh(s) and the lower extremity is prepped and draped in the usual sterile fashion. Time out is performed by the operating team and then the  Left lower extremity is wrapped in Esmarch, knee flexed and the tourniquet inflated to 300 mmHg.       A midline incision is made with a ten blade through the subcutaneous tissue to the level of the extensor mechanism. A fresh blade is used to make a medial parapatellar arthrotomy. Soft tissue over the proximal medial tibia is subperiosteally elevated to the joint line with a knife and into the semimembranosus bursa with a Cobb elevator. Soft tissue over the proximal lateral tibia is elevated with attention  being paid to avoiding the patellar tendon on the tibial tubercle. The patella is everted, knee flexed 90 degrees and the ACL and PCL are removed. Findings are bone on bone lateral and patellofemoral with large lateral osteophytes.        The drill is used to create a starting hole in the distal femur and the canal is thoroughly irrigated with sterile saline to remove the fatty contents. The 5 degree Left  valgus alignment guide is placed into the femoral canal and the distal femoral cutting block is pinned to remove 10 mm off the distal femur. Resection is made with an oscillating saw.      The tibia is subluxed forward and the menisci are removed. The extramedullary alignment guide is placed referencing proximally at the medial aspect of the tibial tubercle and distally along the second metatarsal axis and tibial crest. The block is pinned to remove 2mm off the more deficient lateral  side. Resection is made with an oscillating saw. Size 2.5is the most appropriate size for the tibia and the proximal tibia is prepared with the modular drill and keel punch for that size.      The femoral sizing guide is placed and size 2.5 is most appropriate. Rotation is marked off the epicondylar axis and confirmed by creating a rectangular flexion gap at 90 degrees. The size 2.5 cutting block is pinned in this rotation and the anterior, posterior and chamfer cuts are made with the oscillating saw. The intercondylar block is then placed and that cut is made.      Trial size 2.5 tibial component, trial size 2.5  posterior stabilized femur and a 10  mm posterior stabilized rotating platform insert trial is placed. Full extension is achieved with excellent varus/valgus and anterior/posterior balance throughout full range of motion. The patella is everted and thickness measured to be 22  mm. Free hand resection is taken to 12 mm, a 35 template is placed, lug holes are drilled, trial patella is placed, and it tracks normally.  Osteophytes are removed off the posterior femur with the trial in place. All trials are removed and the cut bone surfaces prepared with pulsatile lavage. Cement is mixed and once ready for implantation, the size 2.5 tibial implant, size  2.5 posterior stabilized femoral component, and the size 35 patella are cemented in place and the patella is held with the clamp. The trial insert is placed and the knee held in full extension. The Exparel (20 ml mixed with 30 ml saline) and .25% Bupivicaine, are injected into the extensor mechanism, posterior capsule, medial and lateral gutters and subcutaneous tissues.  All extruded cement is removed and once the cement is hard the permanent 10 mm posterior stabilized rotating platform insert is placed into the tibial tray.      The wound is copiously irrigated with saline solution and the extensor mechanism closed over a hemovac drain with #1 PDS suture. The tourniquet is released for a total tourniquet time of 29  minutes. Flexion against gravity is 140 degrees and the patella tracks normally. Subcutaneous tissue is closed with 2.0 vicryl and subcuticular with running 4.0 Monocryl. The incision is cleaned and dried and steri-strips and a bulky sterile dressing are applied. The limb is placed into a knee immobilizer and the patient is awakened and transported to recovery in stable condition.      Please note that a surgical assistant was a medical necessity for this procedure in order to perform it in a safe and expeditious manner. Surgical assistant was necessary to retract the ligaments and vital neurovascular structures to prevent injury to them and also necessary for proper positioning of the limb to allow for anatomic placement of the prosthesis.   Gus Rankin Rosibel Giacobbe, MD    04/27/2013, 8:20 AM

## 2013-04-27 NOTE — Anesthesia Procedure Notes (Signed)
Spinal  Patient location during procedure: OR Start time: 04/27/2013 7:15 AM End time: 04/27/2013 7:22 AM Staffing Anesthesiologist: Ronelle Nigh L Performed by: anesthesiologist  Preanesthetic Checklist Completed: patient identified, site marked, surgical consent, pre-op evaluation, timeout performed, IV checked, risks and benefits discussed and monitors and equipment checked Spinal Block Patient position: sitting Prep: Betadine Patient monitoring: heart rate, continuous pulse ox and blood pressure Location: L3-4 Injection technique: single-shot Needle Needle type: Whitacre  Needle gauge: 25 G Needle length: 9 cm Assessment Sensory level: T6 Additional Notes Expiration date of kit checked and confirmed. Patient tolerated procedure well, without complications.

## 2013-04-27 NOTE — Progress Notes (Signed)
O.K. To go to floor with spinal level of L3- per Dr. Ewell 

## 2013-04-28 LAB — CBC
HCT: 30.4 % — ABNORMAL LOW (ref 36.0–46.0)
MCHC: 34.2 g/dL (ref 30.0–36.0)
Platelets: 290 10*3/uL (ref 150–400)
RBC: 3.08 MIL/uL — ABNORMAL LOW (ref 3.87–5.11)
WBC: 9.3 10*3/uL (ref 4.0–10.5)

## 2013-04-28 LAB — BASIC METABOLIC PANEL
CO2: 24 mEq/L (ref 19–32)
Chloride: 97 mEq/L (ref 96–112)
Sodium: 130 mEq/L — ABNORMAL LOW (ref 135–145)

## 2013-04-28 MED ORDER — TRAMADOL HCL 50 MG PO TABS: 50.0000 mg | ORAL_TABLET | Freq: Four times a day (QID) | ORAL | Status: DC | PRN

## 2013-04-28 MED ORDER — METHOCARBAMOL 500 MG PO TABS: 500.0000 mg | ORAL_TABLET | Freq: Four times a day (QID) | ORAL | Status: DC | PRN

## 2013-04-28 MED ORDER — OXYCODONE HCL 5 MG PO TABS: 5.0000 mg | ORAL_TABLET | ORAL | Status: DC | PRN

## 2013-04-28 MED ORDER — RIVAROXABAN 10 MG PO TABS: 10.0000 mg | ORAL_TABLET | Freq: Every day | ORAL | Status: DC

## 2013-04-28 NOTE — Progress Notes (Signed)
Physical Therapy Treatment Patient Details Name: Maria Jackson MRN: [id number] DOB: 1946/03/23 Today's Date: 04/28/2013 Time: 7829-5621 PT Time Calculation (min): 26 min  PT Assessment / Plan / Recommendation  History of Present Illness s/p L TKA   PT Comments     Follow Up Recommendations  Home health PT     Does the patient have the potential to tolerate intense rehabilitation     Barriers to Discharge        Equipment Recommendations  None recommended by PT    Recommendations for Other Services OT consult  Frequency 7X/week   Progress towards PT Goals Progress towards PT goals: Progressing toward goals  Plan Current plan remains appropriate    Precautions / Restrictions Precautions Precautions: Knee;Fall Required Braces or Orthoses: Knee Immobilizer - Left Knee Immobilizer - Left: Discontinue once straight leg raise with < 10 degree lag Restrictions Weight Bearing Restrictions: No Other Position/Activity Restrictions: WBAT   Pertinent Vitals/Pain 6/10; premed, cold packs provided    Mobility  Bed Mobility Bed Mobility: Supine to Sit;Sit to Supine Supine to Sit: 4: Min assist Sit to Supine: 4: Min assist Details for Bed Mobility Assistance: assist for L LE Transfers Transfers: Sit to Stand;Stand to Sit Sit to Stand: 4: Min assist;From bed;From chair/3-in-1 Stand to Sit: 4: Min assist;To bed;To chair/3-in-1 Details for Transfer Assistance: cues for LE management and use of UEs to self assist Ambulation/Gait Ambulation/Gait Assistance: 4: Min assist Ambulation Distance (Feet): 145 Feet (and 25) Assistive device: Rolling walker Ambulation/Gait Assistance Details: cues for posture and postion from RW Gait Pattern: Step-to pattern;Decreased step length - right;Decreased step length - left;Antalgic;Trunk flexed;Shuffle General Gait Details: ltd by onset nausea/vomiting    Exercises Total Joint Exercises Ankle Circles/Pumps: AROM;10 reps;Supine;Both Quad  Sets: AROM;Both;Supine;15 reps Heel Slides: AAROM;10 reps;Supine;Left Straight Leg Raises: AAROM;Left;10 reps;Supine   PT Diagnosis:    PT Problem List:   PT Treatment Interventions:     PT Goals (current goals can now be found in the care plan section) Acute Rehab PT Goals Patient Stated Goal: Resume previous lifestyle with decreased pain PT Goal Formulation: With patient Time For Goal Achievement: 05/04/13 Potential to Achieve Goals: Good  Visit Information  Last PT Received On: 04/28/13 Assistance Needed: +1 History of Present Illness: s/p L TKA    Subjective Data  Patient Stated Goal: Resume previous lifestyle with decreased pain   Cognition  Cognition Arousal/Alertness: Awake/alert Behavior During Therapy: WFL for tasks assessed/performed Overall Cognitive Status: Within Functional Limits for tasks assessed    Balance     End of Session PT - End of Session Equipment Utilized During Treatment: Gait belt;Left knee immobilizer Activity Tolerance: Patient tolerated treatment well Patient left: in bed;with call bell/phone within reach Nurse Communication: Mobility status   GP     Maria Jackson 04/28/2013, 2:24 PM

## 2013-04-28 NOTE — Progress Notes (Signed)
Physical Therapy Treatment Patient Details Name: Maria Jackson MRN: [id number] DOB: 06-Oct-1945 Today's Date: 04/28/2013 Time: 4098-1191 PT Time Calculation (min): 50 min  PT Assessment / Plan / Recommendation  History of Present Illness s/p L TKA   PT Comments     Follow Up Recommendations  Home health PT     Does the patient have the potential to tolerate intense rehabilitation     Barriers to Discharge        Equipment Recommendations  None recommended by PT    Recommendations for Other Services OT consult  Frequency 7X/week   Progress towards PT Goals Progress towards PT goals: Progressing toward goals  Plan Current plan remains appropriate    Precautions / Restrictions Precautions Precautions: Knee;Fall Required Braces or Orthoses: Knee Immobilizer - Left Knee Immobilizer - Left: Discontinue once straight leg raise with < 10 degree lag Restrictions Weight Bearing Restrictions: No Other Position/Activity Restrictions: WBAT   Pertinent Vitals/Pain 4/10; premed, cold packs provided    Mobility  Bed Mobility Bed Mobility: Supine to Sit Supine to Sit: 4: Min assist Details for Bed Mobility Assistance: assist for L LE Transfers Transfers: Sit to Stand;Stand to Sit Sit to Stand: 4: Min assist Stand to Sit: 4: Min assist Details for Transfer Assistance: cues for LE management and use of UEs to self assist Ambulation/Gait Ambulation/Gait Assistance: 4: Min assist Ambulation Distance (Feet): 100 Feet (and 12) Assistive device: Rolling walker Ambulation/Gait Assistance Details: cues for sequence, posture and position from RW Gait Pattern: Step-to pattern;Decreased step length - right;Decreased step length - left;Antalgic;Trunk flexed;Shuffle General Gait Details: ltd by onset nausea/vomiting    Exercises Total Joint Exercises Ankle Circles/Pumps: AROM;10 reps;Supine;Both Quad Sets: AROM;Both;Supine;15 reps Heel Slides: AAROM;10 reps;Supine;Left Straight Leg  Raises: AAROM;Left;10 reps;Supine   PT Diagnosis:    PT Problem List:   PT Treatment Interventions:     PT Goals (current goals can now be found in the care plan section) Acute Rehab PT Goals Patient Stated Goal: Resume previous lifestyle with decreased pain PT Goal Formulation: With patient Time For Goal Achievement: 05/04/13 Potential to Achieve Goals: Good  Visit Information  Last PT Received On: 04/28/13 Assistance Needed: +1 History of Present Illness: s/p L TKA    Subjective Data  Patient Stated Goal: Resume previous lifestyle with decreased pain   Cognition  Cognition Arousal/Alertness: Awake/alert Behavior During Therapy: WFL for tasks assessed/performed Overall Cognitive Status: Within Functional Limits for tasks assessed    Balance     End of Session PT - End of Session Equipment Utilized During Treatment: Gait belt;Left knee immobilizer Activity Tolerance: Patient limited by fatigue;Other (comment) Patient left: in chair;with call bell/phone within reach;with family/visitor present Nurse Communication: Mobility status   GP     Maria Jackson 04/28/2013, 1:24 PM

## 2013-04-28 NOTE — Evaluation (Signed)
Occupational Therapy Evaluation Patient Details Name: Maria Jackson MRN: [id number] DOB: 1946-06-18 Today's Date: 04/28/2013 Time: 4098-1191 OT Time Calculation (min): 23 min  OT Assessment / Plan / Recommendation History of present illness s/p L TKA   Clinical Impression   OT evaluation limited by nausea and vomiting.  Pt educated in use of 3 in 1 and adaptive equipment for ADL.  Will rely on husband for assist with LB ADL and to supervise shower.      OT Assessment  Patient does not need any further OT services    Follow Up Recommendations  No OT follow up;Supervision/Assistance - 24 hour    Barriers to Discharge      Equipment Recommendations  3 in 1 bedside comode    Recommendations for Other Services    Frequency       Precautions / Restrictions Precautions Precautions: Knee;Fall Required Braces or Orthoses: Knee Immobilizer - Left Knee Immobilizer - Left: Discontinue once straight leg raise with < 10 degree lag Restrictions Weight Bearing Restrictions: No Other Position/Activity Restrictions: WBAT   Pertinent Vitals/Pain L knee pain reported as "not too bad," VSS    ADL  Eating/Feeding: Independent Where Assessed - Eating/Feeding: Bed level Grooming: Wash/dry hands;Wash/dry face;Set up Where Assessed - Grooming: Unsupported sitting Upper Body Bathing: Set up Where Assessed - Upper Body Bathing: Unsupported sitting Lower Body Bathing: Minimal assistance Where Assessed - Lower Body Bathing: Unsupported sitting;Supported sit to stand Upper Body Dressing: Set up Where Assessed - Upper Body Dressing: Unsupported sitting Lower Body Dressing: Minimal assistance Where Assessed - Lower Body Dressing: Unsupported sitting;Supported sit to stand Equipment Used: Sock aid;Reacher;Long-handled sponge;Long-handled shoe horn Transfers/Ambulation Related to ADLs: Pt became nauseous at EOB, returned to supine ADL Comments: Educated pt in availability of AE for LB ADL.  Will  rely on husband.  Will benefit from [facility] for home as her bathroom is not close to the bedroom.    OT Diagnosis:    OT Problem List:   OT Treatment Interventions:     OT Goals(Current goals can be found in the care plan section)    Visit Information  Last OT Received On: 04/28/13 Assistance Needed: +1 History of Present Illness: s/p L TKA       Prior Functioning     Home Living Family/patient expects to be discharged to:: Private residence Living Arrangements: Spouse/significant other Available Help at Discharge: Family;Available 24 hours/day Type of Home: House Home Access: Stairs to enter [employer] of Steps: 4 Entrance Stairs-Rails: Right;Left Home Layout: Able to live on main level with bedroom/bathroom Home Equipment: Walker - 2 wheels;Shower seat Additional Comments: Bathroom on main level is not near bedroom. Prior Function Level of Independence: Independent;Independent with assistive device(s) Communication Communication: No difficulties Dominant Hand: Right         Vision/Perception Vision - History Baseline Vision: Wears glasses only for reading Patient Visual Report: No change from baseline   Cognition  Cognition Arousal/Alertness: Awake/alert Behavior During Therapy: WFL for tasks assessed/performed Overall Cognitive Status: Within Functional Limits for tasks assessed    Extremity/Trunk Assessment Upper Extremity Assessment Upper Extremity Assessment: Overall WFL for tasks assessed Lower Extremity Assessment Lower Extremity Assessment: Defer to PT evaluation     Mobility Bed Mobility Bed Mobility: Supine to Sit;Sit to Supine Supine to Sit: 4: Min assist;HOB elevated Sit to Supine: 4: Min assist;HOB elevated Details for Bed Mobility Assistance: assist for L LE Transfers Transfers: Not assessed (per PT not, pt required min assist POD 0.)  Exercise     Balance     End of Session OT - End of Session Activity Tolerance:  Patient limited by fatigue (limited by nausea) Patient left: in bed;with call bell/phone within reach  GO     Evern Bio 04/28/2013, 9:01 AM [phone]

## 2013-04-28 NOTE — Progress Notes (Signed)
   Subjective: 1 Day Post-Op Procedure(s) (LRB): LEFT TOTAL KNEE ARTHROPLASTY (Left) Patient reports pain as mild.   Patient seen in rounds with Dr. Lequita Halt. Patient is well, and has had no acute complaints or problems We will start therapy today.  Plan is to go Home after hospital stay.  Plan probably home tomorrow - Sunday.  Objective: Vital signs in last 24 hours: Temp:  [96.2 F (35.7 C)-98.5 F (36.9 C)] 98.3 F (36.8 C) (10/25 0530) Pulse Rate:  [52-70] 56 (10/25 0530) Resp:  [13-20] 17 (10/25 0530) BP: (105-130)/(44-74) 130/60 mmHg (10/25 0530) SpO2:  [99 %-100 %] 99 % (10/25 0530) Weight:  [67.586 kg (149 lb)] 67.586 kg (149 lb) (10/24 1020)  Intake/Output from previous day:  Intake/Output Summary (Last 24 hours) at 04/28/13 0813 Last data filed at 04/28/13 0600  Gross per 24 hour  Intake 4487.5 ml  Output   5060 ml  Net -572.5 ml    Intake/Output this shift:    Labs:  Recent Labs  04/28/13 0433  HGB 10.4*    Recent Labs  04/28/13 0433  WBC 9.3  RBC 3.08*  HCT 30.4*  PLT 290    Recent Labs  04/28/13 0433  NA 130*  K 3.6  CL 97  CO2 24  BUN 8  CREATININE 0.57  GLUCOSE 150*  CALCIUM 8.5   No results found for this basename: LABPT, INR,  in the last 72 hours  EXAM General - Patient is Alert, Appropriate and Oriented Extremity - Neurovascular intact Sensation intact distally Dorsiflexion/Plantar flexion intact Dressing - dressing C/D/I Motor Function - intact, moving foot and toes well on exam.  Hemovac pulled without difficulty.  Past Medical History  Diagnosis Date  . Allergy   . Arthritis     knees  . Osteopenia   . Raynaud disease   . Insomnia     NO LONGER AN ISSUE  . Spondylolisthesis     lumbar  . 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 Seasonal allergies     Assessment/Plan: 1 Day Post-Op Procedure(s) (LRB): LEFT TOTAL KNEE ARTHROPLASTY (Left) Principal Problem:   OA (osteoarthritis) of  knee  Estimated body mass index is 27.25 kg/(m^2) as calculated from the following:   Height as of this encounter: 5\' 2"  (1.575 m).   Weight as of this encounter: 67.586 kg (149 lb). Up with therapy Plan for discharge tomorrow Discharge home with home health  DVT Prophylaxis - Xarelto Weight-Bearing as tolerated to left leg D/C O2 and Pulse OX and try on Room Air  Patrica Duel 04/28/2013, 8:13 AM

## 2013-04-28 NOTE — Discharge Summary (Signed)
Physician Discharge Summary   Patient ID: Maria Jackson MRN: [id number] DOB/AGE: [id number] y.o.  Admit date: 04/27/2013 Discharge date: 04/30/2013  Primary Diagnosis:  Osteoarthritis Left knee(s)  Admission Diagnoses:  Past Medical History  Diagnosis Date  . Allergy   . Arthritis     knees  . Osteopenia   . Raynaud disease   . Insomnia     NO LONGER AN ISSUE  . Spondylolisthesis     lumbar  . 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 Seasonal allergies    Discharge Diagnoses:   Principal Problem:   OA (osteoarthritis) of knee  Estimated body mass index is 27.25 kg/(m^2) as calculated from the following:   Height as of this encounter: 5\' 2"  (1.575 m).   Weight as of this encounter: 67.586 kg (149 lb).  Procedure:  Procedure(s) (LRB): LEFT TOTAL KNEE ARTHROPLASTY (Left)   Consults: None  HPI: Maria Jackson is a 67 y.o. year old female with end stage OA of her left knee with progressively worsening pain and dysfunction. She has constant pain, with activity and at rest and significant functional deficits with difficulties even with ADLs. She has had extensive non-op management including analgesics, injections of cortisone and viscosupplements, and home exercise program, but remains in significant pain with significant dysfunction. Radiographs show bone on bone arthritis lateral and patellofemoral. She presents now for left Total Knee Arthroplasty.   Laboratory Data: Admission on 04/27/2013, Discharged on 04/30/2013  Component Date Value Range Status  . WBC 04/28/2013 9.3  4.0 - 10.5 K/uL Final  . RBC 04/28/2013 3.08* 3.87 - 5.11 MIL/uL Final  . Hemoglobin 04/28/2013 10.4* 12.0 - 15.0 g/dL Final  . HCT 40/98/1191 30.4* 36.0 - 46.0 % Final  . MCV 04/28/2013 98.7  78.0 - 100.0 fL Final  . MCH 04/28/2013 33.8  26.0 - 34.0 pg Final  . MCHC 04/28/2013 34.2  30.0 - 36.0 g/dL Final  . RDW 47/82/9562 12.9  11.5 - 15.5 % Final  . Platelets 04/28/2013  290  150 - 400 K/uL Final  . Sodium 04/28/2013 130* 135 - 145 mEq/L Final  . Potassium 04/28/2013 3.6  3.5 - 5.1 mEq/L Final  . Chloride 04/28/2013 97  96 - 112 mEq/L Final  . CO2 04/28/2013 24  19 - 32 mEq/L Final  . Glucose, Bld 04/28/2013 150* 70 - 99 mg/dL Final  . BUN 13/02/6577 8  6 - 23 mg/dL Final  . Creatinine, Ser 04/28/2013 0.57  0.50 - 1.10 mg/dL Final  . Calcium 46/96/2952 8.5  8.4 - 10.5 mg/dL Final  . GFR calc non Af Amer 04/28/2013 >90  >90 mL/min Final  . GFR calc Af Amer 04/28/2013 >90  >90 mL/min Final   Comment: (NOTE)                          The eGFR has been calculated using the CKD EPI equation.                          This calculation has not been validated in all clinical situations.                          eGFR's persistently <90 mL/min signify possible Chronic Kidney  Disease.  . WBC 04/29/2013 10.1  4.0 - 10.5 K/uL Final  . RBC 04/29/2013 3.28* 3.87 - 5.11 MIL/uL Final  . Hemoglobin 04/29/2013 11.1* 12.0 - 15.0 g/dL Final  . HCT 16/04/9603 32.1* 36.0 - 46.0 % Final  . MCV 04/29/2013 97.9  78.0 - 100.0 fL Final  . MCH 04/29/2013 33.8  26.0 - 34.0 pg Final  . MCHC 04/29/2013 34.6  30.0 - 36.0 g/dL Final  . RDW 54/03/8118 13.1  11.5 - 15.5 % Final  . Platelets 04/29/2013 274  150 - 400 K/uL Final  . Sodium 04/29/2013 129* 135 - 145 mEq/L Final  . Potassium 04/29/2013 4.1  3.5 - 5.1 mEq/L Final  . Chloride 04/29/2013 96  96 - 112 mEq/L Final  . CO2 04/29/2013 26  19 - 32 mEq/L Final  . Glucose, Bld 04/29/2013 94  70 - 99 mg/dL Final  . BUN 14/78/2956 8  6 - 23 mg/dL Final  . Creatinine, Ser 04/29/2013 0.66  0.50 - 1.10 mg/dL Final  . Calcium 21/30/8657 8.9  8.4 - 10.5 mg/dL Final  . GFR calc non Af Amer 04/29/2013 88* >90 mL/min Final  . GFR calc Af Amer 04/29/2013 >90  >90 mL/min Final   Comment: (NOTE)                          The eGFR has been calculated using the CKD EPI equation.                          This calculation has  not been validated in all clinical situations.                          eGFR's persistently <90 mL/min signify possible Chronic Kidney                          Disease.  . WBC 04/30/2013 8.3  4.0 - 10.5 K/uL Final  . RBC 04/30/2013 3.13* 3.87 - 5.11 MIL/uL Final  . Hemoglobin 04/30/2013 10.5* 12.0 - 15.0 g/dL Final  . HCT 84/69/6295 30.9* 36.0 - 46.0 % Final  . MCV 04/30/2013 98.7  78.0 - 100.0 fL Final  . MCH 04/30/2013 33.5  26.0 - 34.0 pg Final  . MCHC 04/30/2013 34.0  30.0 - 36.0 g/dL Final  . RDW 28/41/3244 13.4  11.5 - 15.5 % Final  . Platelets 04/30/2013 264  150 - 400 K/uL Final  Hospital Outpatient Visit on 04/23/2013  Component Date Value Range Status  . MRSA, PCR 04/23/2013 NEGATIVE  NEGATIVE Final  . Staphylococcus aureus 04/23/2013 POSITIVE* NEGATIVE Final   Comment:                                 The Xpert SA Assay (FDA                          approved for NASAL specimens                          in patients over 69 years of age),                          is one component of  a comprehensive surveillance                          program.  Test performance has                          been validated by [facility] for patients greater                          than or equal to 75 year old.                          It is not intended                          to diagnose infection nor to                          guide or monitor treatment.  Marland Kitchen aPTT 04/23/2013 26  24 - 37 seconds Final  . WBC 04/23/2013 5.0  4.0 - 10.5 K/uL Final  . RBC 04/23/2013 3.99  3.87 - 5.11 MIL/uL Final  . Hemoglobin 04/23/2013 13.5  12.0 - 15.0 g/dL Final  . HCT 16/04/9603 40.0  36.0 - 46.0 % Final  . MCV 04/23/2013 100.3* 78.0 - 100.0 fL Final  . MCH 04/23/2013 33.8  26.0 - 34.0 pg Final  . MCHC 04/23/2013 33.8  30.0 - 36.0 g/dL Final  . RDW 54/03/8118 12.9  11.5 - 15.5 % Final  . Platelets 04/23/2013 414* 150 - 400 K/uL Final  . Sodium  04/23/2013 135  135 - 145 mEq/L Final  . Potassium 04/23/2013 5.2* 3.5 - 5.1 mEq/L Final  . Chloride 04/23/2013 97  96 - 112 mEq/L Final  . CO2 04/23/2013 29  19 - 32 mEq/L Final  . Glucose, Bld 04/23/2013 81  70 - 99 mg/dL Final  . BUN 14/78/2956 12  6 - 23 mg/dL Final  . Creatinine, Ser 04/23/2013 0.70  0.50 - 1.10 mg/dL Final  . Calcium 21/30/8657 10.2  8.4 - 10.5 mg/dL Final  . Total Protein 04/23/2013 7.3  6.0 - 8.3 g/dL Final  . Albumin 84/69/6295 3.9  3.5 - 5.2 g/dL Final  . AST 28/41/3244 25  0 - 37 U/L Final  . ALT 04/23/2013 16  0 - 35 U/L Final  . Alkaline Phosphatase 04/23/2013 64  39 - 117 U/L Final  . Total Bilirubin 04/23/2013 0.7  0.3 - 1.2 mg/dL Final  . GFR calc non Af Amer 04/23/2013 86* >90 mL/min Final  . GFR calc Af Amer 04/23/2013 >90  >90 mL/min Final   Comment: (NOTE)                          The eGFR has been calculated using the CKD EPI equation.                          This calculation has not been validated in all clinical situations.  eGFR's persistently <90 mL/min signify possible Chronic Kidney                          Disease.  Marland Kitchen Prothrombin Time 04/23/2013 12.5  11.6 - 15.2 seconds Final  . INR 04/23/2013 0.95  0.00 - 1.49 Final  . ABO/RH(D) 04/23/2013 A POS   Final  . Antibody Screen 04/23/2013 NEG   Final  . Sample Expiration 04/23/2013 04/30/2013   Final  . Color, Urine 04/23/2013 YELLOW  YELLOW Final  . APPearance 04/23/2013 CLEAR  CLEAR Final  . Specific Gravity, Urine 04/23/2013 1.014  1.005 - 1.030 Final  . pH 04/23/2013 6.5  5.0 - 8.0 Final  . Glucose, UA 04/23/2013 NEGATIVE  NEGATIVE mg/dL Final  . Hgb urine dipstick 04/23/2013 TRACE* NEGATIVE Final  . Bilirubin Urine 04/23/2013 NEGATIVE  NEGATIVE Final  . Ketones, ur 04/23/2013 NEGATIVE  NEGATIVE mg/dL Final  . Protein, ur 40/98/1191 NEGATIVE  NEGATIVE mg/dL Final  . Urobilinogen, UA 04/23/2013 0.2  0.0 - 1.0 mg/dL Final  . Nitrite 47/82/9562 NEGATIVE  NEGATIVE  Final  . Leukocytes, UA 04/23/2013 NEGATIVE  NEGATIVE Final  . Squamous Epithelial / LPF 04/23/2013 RARE  RARE Final  . RBC / HPF 04/23/2013 0-2  <3 RBC/hpf Final  . Bacteria, UA 04/23/2013 RARE  RARE Final  . ABO/RH(D) 04/23/2013 A POS   Final     X-Rays:No results found.  EKG:No orders found for this or any previous visit.   Hospital Course: Maria Jackson is a 67 y.o. who was admitted to [facility]. They were brought to the operating room on 04/27/2013 and underwent Procedure(s): LEFT TOTAL KNEE ARTHROPLASTY.  Patient tolerated the procedure well and was later transferred to the recovery room and then to the orthopaedic floor for postoperative care.  They were given PO and IV analgesics for pain control following their surgery.  They were given 24 hours of postoperative antibiotics of  Anti-infectives   Start     Dose/Rate Route Frequency Ordered Stop   04/27/13 2000  vancomycin (VANCOCIN) IVPB 1000 mg/200 mL premix     1,000 mg 200 mL/hr over 60 Minutes Intravenous Every 12 hours 04/27/13 1029 04/27/13 2044   04/27/13 0519  vancomycin (VANCOCIN) IVPB 1000 mg/200 mL premix     1,000 mg 200 mL/hr over 60 Minutes Intravenous On call to O.R. 04/27/13 1308 04/27/13 0655     and started on DVT prophylaxis in the form of Xarelto.   PT and OT were ordered for total joint protocol.  Discharge planning consulted to help with postop disposition and equipment needs.  Patient had a good night on the evening of surgery.  They started to get up OOB with therapy on day one. Hemovac drain was pulled without difficulty.  Continued to work with therapy into day two.  Dressing was changed on day two and the incision was healing well.  By day three, the patient had progressed with therapy and meeting their goals.  Incision was healing well.  Patient was seen in rounds and was ready to go home.   Discharge Medications: Prior to Admission medications   Medication Sig Start Date End Date Taking?  Authorizing Provider  cetirizine (ZYRTEC) 10 MG tablet Take 10 mg by mouth daily. TAKES AT BEDTIME   Yes Historical Provider, MD  Polyethylene Glycol 400 (BLINK TEARS OP) Place 1 drop into both eyes daily.   Yes Historical Provider, MD  sodium chloride (OCEAN) 0.65 %  nasal spray Place 1 spray into the nose daily as needed for congestion.   Yes Historical Provider, MD  fluticasone (FLONASE) 50 MCG/ACT nasal spray Place 1 spray into the nose Daily. ONE SPRAY EACH NOSTRIL AT NIGHT 02/13/11   Historical Provider, MD  methocarbamol (ROBAXIN) 500 MG tablet Take 1 tablet (500 mg total) by mouth every 6 (six) hours as needed. 04/28/13   Alexzandrew Perkins, PA-C  oxyCODONE (OXY IR/ROXICODONE) 5 MG immediate release tablet Take 1-2 tablets (5-10 mg total) by mouth every 3 (three) hours as needed. 04/28/13   Alexzandrew Julien Girt, PA-C  rivaroxaban (XARELTO) 10 MG TABS tablet Take 1 tablet (10 mg total) by mouth daily with breakfast. Take Xarelto for two and a half more weeks, then discontinue Xarelto. Once the patient has completed the Xarelto, they may resume the 81 mg Aspirin. 04/28/13   Alexzandrew Perkins, PA-C  traMADol (ULTRAM) 50 MG tablet Take 1-2 tablets (50-100 mg total) by mouth every 6 (six) hours as needed (mild pain). 04/28/13   Alexzandrew Julien Girt, PA-C   Discharge home with home health  Diet - Regular diet  Follow up - in 2 weeks  Activity - WBAT  Disposition - Home  Condition Upon Discharge - Good  D/C Meds - See DC Summary  DVT Prophylaxis - Xarelto       Discharge Orders   Future Orders Complete By Expires   Call MD / Call 911  As directed    Comments:     If you experience chest pain or shortness of breath, CALL 911 and be transported to the hospital emergency room.  If you develope a fever above 101 F, pus (white drainage) or increased drainage or redness at the wound, or calf pain, call your surgeon's office.   Change dressing  As directed    Comments:     Change dressing  daily with sterile 4 x 4 inch gauze dressing and apply TED hose. Do not submerge the incision under water.   Constipation Prevention  As directed    Comments:     Drink plenty of fluids.  Prune juice may be helpful.  You may use a stool softener, such as Colace (over the counter) 100 mg twice a day.  Use MiraLax (over the counter) for constipation as needed.   Diet - low sodium heart healthy  As directed    Diet Carb Modified  As directed    Discharge instructions  As directed    Comments:     Pick up stool softner and laxative for home. Do not submerge incision under water. May shower. Continue to use ice for pain and swelling from surgery. Hip precautions.  Total Hip Protocol.  Take Xarelto for two and a half more weeks, then discontinue Xarelto.   Do not put a pillow under the knee. Place it under the heel.  As directed    Do not sit on low chairs, stoools or toilet seats, as it may be difficult to get up from low surfaces  As directed    Driving restrictions  As directed    Comments:     No driving until released by the physician.   Increase activity slowly as tolerated  As directed    Lifting restrictions  As directed    Comments:     No lifting until released by the physician.   Patient may shower  As directed    Comments:     You may shower without a dressing once there is  no drainage.  Do not wash over the wound.  If drainage remains, do not shower until drainage stops.   TED hose  As directed    Comments:     Use stockings (TED hose) for 3 weeks on both leg(s).  You may remove them at night for sleeping.   Weight bearing as tolerated  As directed    Questions:     Laterality:     Extremity:         Medication List    STOP taking these medications       aspirin 81 MG tablet     cholecalciferol 1000 UNITS tablet  Commonly known as:  VITAMIN D     conjugated estrogens vaginal cream  Commonly known as:  PREMARIN     Fish Oil 1200 MG Caps     hydroxypropyl  methylcellulose 2.5 % ophthalmic solution  Commonly known as:  ISOPTO TEARS     Magnesium 250 MG Tabs     multivitamin with minerals Tabs tablet     mupirocin nasal ointment 2 %  Commonly known as:  BACTROBAN      TAKE these medications       BLINK TEARS OP  Place 1 drop into both eyes daily.     cetirizine 10 MG tablet  Commonly known as:  ZYRTEC  Take 10 mg by mouth daily. TAKES AT BEDTIME     fluticasone 50 MCG/ACT nasal spray  Commonly known as:  FLONASE  Place 1 spray into the nose Daily. ONE SPRAY EACH NOSTRIL AT NIGHT     methocarbamol 500 MG tablet  Commonly known as:  ROBAXIN  Take 1 tablet (500 mg total) by mouth every 6 (six) hours as needed.     oxyCODONE 5 MG immediate release tablet  Commonly known as:  Oxy IR/ROXICODONE  Take 1-2 tablets (5-10 mg total) by mouth every 3 (three) hours as needed.     rivaroxaban 10 MG Tabs tablet  Commonly known as:  XARELTO  - Take 1 tablet (10 mg total) by mouth daily with breakfast. Take Xarelto for two and a half more weeks, then discontinue Xarelto.  - Once the patient has completed the Xarelto, they may resume the 81 mg Aspirin.     sodium chloride 0.65 % nasal spray  Commonly known as:  OCEAN  Place 1 spray into the nose daily as needed for congestion.     traMADol 50 MG tablet  Commonly known as:  ULTRAM  Take 1-2 tablets (50-100 mg total) by mouth every 6 (six) hours as needed (mild pain).       Follow-up Information   Follow up with Loanne Drilling, MD. Schedule an appointment as soon as possible for a visit in 2 weeks.   Specialty:  Orthopedic Surgery   Contact information:   [address] [place] [address] [phone]       Signed: Patrica Duel 05/10/2013, 9:24 AM

## 2013-04-29 LAB — CBC
HCT: 32.1 % — ABNORMAL LOW (ref 36.0–46.0)
Hemoglobin: 11.1 g/dL — ABNORMAL LOW (ref 12.0–15.0)
MCH: 33.8 pg (ref 26.0–34.0)
MCHC: 34.6 g/dL (ref 30.0–36.0)
Platelets: 274 10*3/uL (ref 150–400)
RDW: 13.1 % (ref 11.5–15.5)
WBC: 10.1 10*3/uL (ref 4.0–10.5)

## 2013-04-29 LAB — BASIC METABOLIC PANEL
Calcium: 8.9 mg/dL (ref 8.4–10.5)
Chloride: 96 mEq/L (ref 96–112)
GFR calc Af Amer: >90 mL/min (ref >90)
GFR calc non Af Amer: 88 mL/min — ABNORMAL LOW (ref >90)
Glucose, Bld: 94 mg/dL (ref 70–99)
Sodium: 129 mEq/L — ABNORMAL LOW (ref 135–145)

## 2013-04-29 NOTE — Progress Notes (Signed)
Patient ID: Maria Jackson, female   DOB: October 26, 1945, 67 y.o.   MRN: [id number]   Subjective: 2 Days Post-Op Procedure(s) (LRB): LEFT TOTAL KNEE ARTHROPLASTY (Left) Patient reports pain as moderate.  Required more medications yesterday.  Patient seen and exaimed in rounds. Patient is reluctant to D/C today. Plan is to go home after hospital stay. Progressing well with PT.  Objective: Vital signs in last 24 hours: Temp:  [98 F (36.7 C)-100 F (37.8 C)] 100 F (37.8 C) (10/26 0300) Pulse Rate:  [67-85] 85 (10/26 0300) Resp:  [16-20] 20 (10/26 0300) BP: (146-149)/(71) 149/71 mmHg (10/26 0300) SpO2:  [97 %-99 %] 97 % (10/26 0300)  Intake/Output from previous day:  Intake/Output Summary (Last 24 hours) at 04/29/13 0820 Last data filed at 04/29/13 0451  Gross per 24 hour  Intake    240 ml  Output   3800 ml  Net  -3560 ml    Intake/Output this shift:    Labs:  Recent Labs  04/28/13 0433 04/29/13 0425  HGB 10.4* 11.1*    Recent Labs  04/28/13 0433 04/29/13 0425  WBC 9.3 10.1  RBC 3.08* 3.28*  HCT 30.4* 32.1*  PLT 290 274    Recent Labs  04/28/13 0433 04/29/13 0425  NA 130* 129*  K 3.6 4.1  CL 97 96  CO2 24 26  BUN 8 8  CREATININE 0.57 0.66  GLUCOSE 150* 94  CALCIUM 8.5 8.9   No results found for this basename: LABPT, INR,  in the last 72 hours  EXAM General - Patient is alert and oriented Extremity -  Calf soft and non tender. LLE neurovascularly intact Dressing/Incision - Well approximated edges.. Dressing changed today Motor Function - intact, moving foot and toes well on exam.   Past Medical History  Diagnosis Date  . Allergy   . Arthritis     knees  . Osteopenia   . Raynaud disease   . Insomnia     NO LONGER AN ISSUE  . Spondylolisthesis     lumbar  . 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 Seasonal allergies     Assessment/Plan: 2 Days Post-Op Procedure(s) (LRB): LEFT TOTAL KNEE ARTHROPLASTY (Left) Principal  Problem:   OA (osteoarthritis) of knee  Estimated body mass index is 27.25 kg/(m^2) as calculated from the following:   Height as of this encounter: 5\' 2"  (1.575 m).   Weight as of this encounter: 67.586 kg (149 lb). Monitor another day for patient concerns regarding medication, diet, and LLE fatigue.  Plan to D/C Home tomorrow. Continue current care IS for temp increase  DVT Prophylaxis - Xarelto Weight-Bearing as tolerated to left leg  Maria Jackson L 04/29/2013, 8:20 AM

## 2013-04-29 NOTE — Progress Notes (Signed)
Physical Therapy Treatment Patient Details Name: Maria Jackson MRN: [id number] DOB: [birth year] Today's Date: 04/29/2013 Time: 6578-4696 PT Time Calculation (min): 38 min  PT Assessment / Plan / Recommendation  History of Present Illness s/p L TKA   PT Comments   Patient progressing with strength left LE and gait with less pain with weight bearing today.  Spouse and patient educated in HEP with handout provided and frequency for home.  Will need to practice stair negotiation prior to d/c in am.  Spouse instructed to assist patient to walk once more today.  Follow Up Recommendations  Home health PT     Does the patient have the potential to tolerate intense rehabilitation   N/A  Barriers to Discharge  None      Equipment Recommendations  None recommended by PT    Recommendations for Other Services  None  Frequency 7X/week   Progress towards PT Goals Progress towards PT goals: Progressing toward goals  Plan Current plan remains appropriate    Precautions / Restrictions Precautions Precautions: Knee;Fall Required Braces or Orthoses: Knee Immobilizer - Left Knee Immobilizer - Left: Discontinue once straight leg raise with < 10 degree lag Restrictions Weight Bearing Restrictions: No Other Position/Activity Restrictions: WBAT   Pertinent Vitals/Pain 4/10 with ambulation left LE    Mobility  Bed Mobility Supine to Sit: 4: Min assist;HOB elevated Details for Bed Mobility Assistance: assist for L LE Transfers Sit to Stand: 4: Min assist;From bed;With upper extremity assist Stand to Sit: With armrests;To chair/3-in-1;4: Min assist Details for Transfer Assistance: cues for LE management and use of UEs to self assist Ambulation/Gait Ambulation Distance (Feet): 150 Feet Assistive device: Rolling walker Ambulation/Gait Assistance Details: cues for walker placement and upright posture, right foot clearance Gait Pattern: Step-to pattern;Decreased step length - right;Decreased  step length - left;Antalgic;Trunk flexed;Shuffle    Exercises Total Joint Exercises Ankle Circles/Pumps: AROM;10 reps;Supine;Both Quad Sets: AROM;Both;Supine;10 reps Short Arc QuadBarbaraann Boys;Left;10 reps;Supine Heel Slides: AAROM;10 reps;Supine;Left Hip ABduction/ADduction: AAROM;Left;10 reps;Supine Straight Leg Raises: AAROM;Left;10 reps;Supine Goniometric ROM: 40-45 degrees flexion; -20 degrees extension left knee     PT Goals (current goals can now be found in the care plan section)    Visit Information  Last PT Received On: 04/29/13 Assistance Needed: +1 History of Present Illness: s/p L TKA    Subjective Data   Feels better to walk after limbered with exercise.   Cognition  Cognition Arousal/Alertness: Awake/alert Behavior During Therapy: WFL for tasks assessed/performed Overall Cognitive Status: Within Functional Limits for tasks assessed    Balance  Balance Balance Assessed: Yes Static Standing Balance Static Standing - Balance Support: Bilateral upper extremity supported Static Standing - Level of Assistance: 5: Stand by assistance  End of Session PT - End of Session Equipment Utilized During Treatment: Gait belt;Left knee immobilizer Activity Tolerance: Patient tolerated treatment well Patient left: in chair;with call bell/phone within reach;with family/visitor present CPM Left Knee CPM Left Knee: Off   GP     [name],CYNDI 04/29/2013, 11:09 AM Sheran Lawless, PT [phone] 04/29/2013

## 2013-04-30 LAB — CBC
HCT: 30.9 % — ABNORMAL LOW (ref 36.0–46.0)
Hemoglobin: 10.5 g/dL — ABNORMAL LOW (ref 12.0–15.0)
MCH: 33.5 pg (ref 26.0–34.0)
Platelets: 264 10*3/uL (ref 150–400)
RBC: 3.13 MIL/uL — ABNORMAL LOW (ref 3.87–5.11)

## 2013-04-30 NOTE — Progress Notes (Signed)
Physical Therapy Treatment Patient Details Name: Maria Jackson MRN: [id number] DOB: 12/29/1945 Today's Date: 04/30/2013 Time: 0935-1020 PT Time Calculation (min): 45 min  PT Assessment / Plan / Recommendation  History of Present Illness s/p L TKA   PT Comments   Pt progressing well; discussed/instructed in simulated car transfers and tub transfers using tub bench; pt and husb verbalize understanding  Follow Up Recommendations  Home health PT     Does the patient have the potential to tolerate intense rehabilitation     Barriers to Discharge        Equipment Recommendations  None recommended by PT    Recommendations for Other Services    Frequency 7X/week   Progress towards PT Goals Progress towards PT goals: Progressing toward goals  Plan Current plan remains appropriate    Precautions / Restrictions Precautions Precautions: Knee;Fall Required Braces or Orthoses: Knee Immobilizer - Left Knee Immobilizer - Left: Discontinue once straight leg raise with < 10 degree lag Restrictions Other Position/Activity Restrictions: WBAT   Pertinent Vitals/Pain 3/10 l knee    Mobility  Bed Mobility Bed Mobility: Supine to Sit Supine to Sit: 4: Min assist;HOB elevated Details for Bed Mobility Assistance: assist for L LE Transfers Transfers: Sit to Stand;Stand to Sit Sit to Stand: 4: Min guard Stand to Sit: 4: Min guard Details for Transfer Assistance: cues for LE management and use of UEs to self assist Ambulation/Gait Ambulation/Gait Assistance: 4: Min guard Ambulation Distance (Feet): 120 Feet Assistive device: Rolling walker Ambulation/Gait Assistance Details: cues for posture and sequence Gait Pattern: Step-to pattern;Decreased step length - right;Decreased step length - left;Antalgic;Trunk flexed;Shuffle Stairs: Yes Stairs Assistance: 4: Min guard Stair Management Technique: One rail Right;Sideways Number of Stairs: 6    Exercises Total Joint Exercises Ankle  Circles/Pumps: AROM;10 reps;Supine;Both Quad Sets: AROM;Both;Supine;10 reps Knee Flexion: AROM;AAROM;Left;10 reps;Seated Goniometric ROM: 20-85* flexion   PT Diagnosis:    PT Problem List:   PT Treatment Interventions:     PT Goals (current goals can now be found in the care plan section) Acute Rehab PT Goals Patient Stated Goal: Resume previous lifestyle with decreased pain Time For Goal Achievement: 05/04/13 Potential to Achieve Goals: Good  Visit Information  Last PT Received On: 04/30/13 Assistance Needed: +1 History of Present Illness: s/p L TKA    Subjective Data  Patient Stated Goal: Resume previous lifestyle with decreased pain   Cognition  Cognition Arousal/Alertness: Awake/alert Behavior During Therapy: WFL for tasks assessed/performed Overall Cognitive Status: Within Functional Limits for tasks assessed    Balance     End of Session PT - End of Session Equipment Utilized During Treatment: Gait belt;Left knee immobilizer Activity Tolerance: Patient tolerated treatment well Patient left: in chair;with call bell/phone within reach;with family/visitor present Nurse Communication: Mobility status   GP     [facility] 04/30/2013, 2:22 PM

## 2013-04-30 NOTE — Progress Notes (Signed)
   Subjective: 3 Days Post-Op Procedure(s) (LRB): LEFT TOTAL KNEE ARTHROPLASTY (Left) Patient reports pain as mild.   Patient seen in rounds by Dr. Lequita Halt.  Stayed the extra day and feels much better this morning.  She is now ready to go home. Patient is well, and has had no acute complaints or problems Patient is ready to go home  Objective: Vital signs in last 24 hours: Temp:  [98.3 F (36.8 C)-99.1 F (37.3 C)] 98.9 F (37.2 C) (10/27 0510) Pulse Rate:  [73-81] 74 (10/27 0510) Resp:  [16-20] 16 (10/27 0510) BP: (110-134)/(66-74) 123/74 mmHg (10/27 0510) SpO2:  [95 %-98 %] 95 % (10/27 0510)  Intake/Output from previous day:  Intake/Output Summary (Last 24 hours) at 04/30/13 0736 Last data filed at 04/29/13 2300  Gross per 24 hour  Intake    720 ml  Output    950 ml  Net   -230 ml    Intake/Output this shift:    Labs:  Recent Labs  04/28/13 0433 04/29/13 0425 04/30/13 0451  HGB 10.4* 11.1* 10.5*    Recent Labs  04/29/13 0425 04/30/13 0451  WBC 10.1 8.3  RBC 3.28* 3.13*  HCT 32.1* 30.9*  PLT 274 264    Recent Labs  04/28/13 0433 04/29/13 0425  NA 130* 129*  K 3.6 4.1  CL 97 96  CO2 24 26  BUN 8 8  CREATININE 0.57 0.66  GLUCOSE 150* 94  CALCIUM 8.5 8.9   No results found for this basename: LABPT, INR,  in the last 72 hours  EXAM: General - Patient is Alert, Appropriate and Oriented Extremity - Neurovascular intact Sensation intact distally Dorsiflexion/Plantar flexion intact Incision - clean, dry, no drainage, healing Motor Function - intact, moving foot and toes well on exam.   Assessment/Plan: 3 Days Post-Op Procedure(s) (LRB): LEFT TOTAL KNEE ARTHROPLASTY (Left) Procedure(s) (LRB): LEFT TOTAL KNEE ARTHROPLASTY (Left) Past Medical History  Diagnosis Date  . Allergy   . Arthritis     knees  . Osteopenia   . Raynaud disease   . Insomnia     NO LONGER AN ISSUE  . Spondylolisthesis     lumbar  . 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 Seasonal allergies    Principal Problem:   OA (osteoarthritis) of knee  Estimated body mass index is 27.25 kg/(m^2) as calculated from the following:   Height as of this encounter: 5\' 2"  (1.575 m).   Weight as of this encounter: 67.586 kg (149 lb). Up with therapy Discharge home with home health Diet - Regular diet Follow up - in 2 weeks Activity - WBAT Disposition - Home Condition Upon Discharge - Good D/C Meds - See DC Summary DVT Prophylaxis - Xarelto  PERKINS, ALEXZANDREW 04/30/2013, 7:36 AM

## 2013-05-10 ENCOUNTER — Other Ambulatory Visit: Payer: Self-pay

## 2013-05-15 ENCOUNTER — Ambulatory Visit: Payer: Medicare Other | Attending: Orthopedic Surgery | Admitting: Physical Therapy

## 2013-05-15 DIAGNOSIS — M25669 Stiffness of unspecified knee, not elsewhere classified: Secondary | ICD-10-CM | POA: Insufficient documentation

## 2013-05-15 DIAGNOSIS — R269 Unspecified abnormalities of gait and mobility: Secondary | ICD-10-CM | POA: Insufficient documentation

## 2013-05-15 DIAGNOSIS — M25569 Pain in unspecified knee: Secondary | ICD-10-CM | POA: Insufficient documentation

## 2013-05-15 DIAGNOSIS — R5381 Other malaise: Secondary | ICD-10-CM | POA: Insufficient documentation

## 2013-05-15 DIAGNOSIS — IMO0001 Reserved for inherently not codable concepts without codable children: Secondary | ICD-10-CM | POA: Insufficient documentation

## 2013-05-16 ENCOUNTER — Ambulatory Visit: Payer: Medicare Other | Admitting: Physical Therapy

## 2013-05-18 ENCOUNTER — Ambulatory Visit: Payer: Medicare Other | Admitting: Physical Therapy

## 2013-05-22 ENCOUNTER — Ambulatory Visit: Payer: Medicare Other | Admitting: *Deleted

## 2013-05-24 ENCOUNTER — Ambulatory Visit: Payer: Medicare Other | Admitting: *Deleted

## 2013-05-25 ENCOUNTER — Ambulatory Visit: Payer: Medicare Other | Admitting: *Deleted

## 2013-05-28 ENCOUNTER — Ambulatory Visit: Payer: Medicare Other | Admitting: Physical Therapy

## 2013-05-28 ENCOUNTER — Encounter: Payer: Medicare Other | Admitting: Gynecology

## 2013-05-30 ENCOUNTER — Ambulatory Visit: Payer: Medicare Other | Admitting: Physical Therapy

## 2013-06-04 ENCOUNTER — Ambulatory Visit: Payer: Medicare Other | Attending: Orthopedic Surgery | Admitting: Physical Therapy

## 2013-06-04 DIAGNOSIS — M25669 Stiffness of unspecified knee, not elsewhere classified: Secondary | ICD-10-CM | POA: Insufficient documentation

## 2013-06-04 DIAGNOSIS — M25569 Pain in unspecified knee: Secondary | ICD-10-CM | POA: Insufficient documentation

## 2013-06-04 DIAGNOSIS — IMO0001 Reserved for inherently not codable concepts without codable children: Secondary | ICD-10-CM | POA: Insufficient documentation

## 2013-06-04 DIAGNOSIS — R269 Unspecified abnormalities of gait and mobility: Secondary | ICD-10-CM | POA: Insufficient documentation

## 2013-06-04 DIAGNOSIS — R5381 Other malaise: Secondary | ICD-10-CM | POA: Insufficient documentation

## 2013-06-05 ENCOUNTER — Ambulatory Visit: Payer: Medicare Other | Admitting: *Deleted

## 2013-06-08 ENCOUNTER — Ambulatory Visit: Payer: Medicare Other | Admitting: *Deleted

## 2013-07-05 DIAGNOSIS — S060X9A Concussion with loss of consciousness of unspecified duration, initial encounter: Secondary | ICD-10-CM

## 2013-07-05 DIAGNOSIS — S060XAA Concussion with loss of consciousness status unknown, initial encounter: Secondary | ICD-10-CM

## 2013-07-05 HISTORY — DX: Concussion with loss of consciousness status unknown, initial encounter: S06.0XAA

## 2013-07-05 HISTORY — DX: Concussion with loss of consciousness of unspecified duration, initial encounter: S06.0X9A

## 2013-08-16 ENCOUNTER — Encounter (HOSPITAL_COMMUNITY): Payer: Self-pay | Admitting: Pharmacy Technician

## 2013-08-16 NOTE — Progress Notes (Signed)
Please put orders in Epic,surhery 08-27-13,pre op visit 08-22-13 Thank you!

## 2013-08-17 ENCOUNTER — Other Ambulatory Visit: Payer: Self-pay | Admitting: Orthopedic Surgery

## 2013-08-17 NOTE — Progress Notes (Signed)
Please put orders in Epic,surgery 08-27-13, pro op 08-22-13 Thank you!

## 2013-08-17 NOTE — Progress Notes (Signed)
Please put orders in Epic,surhery 08-27-13, pre op 08-22-13 Thank you!

## 2013-08-21 ENCOUNTER — Other Ambulatory Visit: Payer: Self-pay | Admitting: Orthopedic Surgery

## 2013-08-21 NOTE — H&P (Signed)
Maria Jackson  DOB: 04/29/1946 Married / Language: English / Race: White Female  Date of Admission:  08-27-2013  Chief Complaint:  Right Knee Pain  History of Present Illness The patient is a 67 year old female who comes in for a preoperative History and Physical. The patient is scheduled for a right total knee arthroplasty to be performed by Dr. Frank V. Aluisio, MD at [facility] on 08-27-2013. The patient is a 67 year old female presenting for a post-operative visit. The patient comes in today several months out from left total knee arthroplasty. The patient states that she is doing very well at this time. The pain is under excellent control at this time and describe their pain as mild. They are currently on no medication for their pain. The patient is currently doing home exercise program. The patient feels that they are progressing well at this time. She is slowly increasing her resistance on the bike and elliptical. She still has some soreness in the knee but no pain. She tends to have more soreness laterally. Overall she is going well. She is comng in for her second knee replacement, the right knee. She is ready to proceed. They have been treated conservatively in the past for the above stated problem and despite conservative measures, they continue to have progressive pain and severe functional limitations and dysfunction. They have failed non-operative management including home exercise, medications, and injections. It is felt that they would benefit from undergoing total joint replacement. Risks and benefits of the procedure have been discussed with the patient and they elect to proceed with surgery. There are no active contraindications to surgery such as ongoing infection or rapidly progressive neurological disease.  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 03/29/1994 Pain in joint, ankle/foot (719.47). 03/25/2006 Enthesopathy, hip (726.5). 03/25/2006 Sprain/strain, ankle NOS (845.00). 03/25/2006 Impaired Vision Tinnitus Hemorrhoids Degenerative Disc Diseas   Family History Bleeding disorder. grandfather fathers side Osteoporosis. mother Cancer. Father. multiple myeloma mother, father and brother Diabetes Mellitus. Mother. mother Heart Disease. father, brother and grandmother mothers side    Social History Illicit drug use. no Pain Contract. no Living situation. live with spouse Previously in rehab. no Drug/Alcohol Rehab (Currently). no Tobacco / smoke exposure. no Exercise. Exercises daily; does running / walking Tobacco use. Never smoker. never smoker Alcohol use. Drinks wine. current drinker; drinks wine; less than 5 per week Marital status. married Current work status. retired Number of flights of stairs before winded. greater than 5 Children. 2 Post-Surgical Plans. Home Advance Directives. Living Will, Healthcare POA    Medication History Fluticasone Propionate (50MCG/ACT Suspension, Nasal) Active. Centrum Silver ( Oral) Active. Fish Oil Burp-Less ( Oral) Specific dose unknown - Active. Aspirin EC (81MG Tablet DR, Oral) Active. Magnesium (250MG Tablet, Oral) Active. ZyrTEC Allergy (10MG Tablet, Oral) Active. GenTeal ( Ophthalmic) Active.    Past Surgical History Knee Replacement, Total. 04/27/2013 Left. Tonsillectomy. Date: 1950. Tubal Ligation Gallbladder Surgery Hysterectomy (not due to cancer) - Complete   Review of Systems General:Not Present- Chills, Fever, Night  Sweats, Fatigue, Weight Gain, Weight Loss and Memory Loss. Skin:Not Present- Hives, Itching, Rash, Eczema and Lesions. HEENT:Present- Tinnitus. Not Present- Headache, Double Vision, Visual Loss, Hearing Loss and   Dentures. Respiratory:Not Present- Shortness of breath with exertion, Shortness of breath at rest, Allergies, Coughing up blood and Chronic Cough. Cardiovascular:Not Present- Chest Pain, Racing/skipping heartbeats, Difficulty Breathing Lying Down, Murmur, Swelling and Palpitations. Gastrointestinal:Not Present- Bloody Stool, Heartburn, Abdominal Pain, Vomiting, Nausea, Constipation, Diarrhea, Difficulty Swallowing, Jaundice and Loss of appetitie. Female Genitourinary:Not Present- Blood in Urine, Urinary frequency, Weak urinary stream, Discharge, Flank Pain, Incontinence, Painful Urination, Urgency, Urinary Retention and Urinating at Night. Musculoskeletal:Present- Joint Swelling and Joint Pain. Not Present- Muscle Weakness, Muscle Pain, Back Pain, Morning Stiffness and Spasms. Neurological:Not Present- Tremor, Dizziness, Blackout spells, Paralysis, Difficulty with balance and Weakness. Psychiatric:Not Present- Insomnia.    Vitals 07/31/2013 12:00 PM BP: 124/72 (Sitting, Left Arm, Standard)     Physical Exam The physical exam findings are as follows:   General Mental Status - Alert, cooperative and good historian. General Appearance- pleasant. Not in acute distress. Orientation- Oriented X3. Build & Nutrition- Well nourished and Well developed.   Head and Neck Head- normocephalic, atraumatic . Neck Global Assessment- supple. no bruit auscultated on the right and no bruit auscultated on the left.   Eye Vision- Wears contact lenses. Pupil- Bilateral- Regular and Round. Motion- Bilateral- EOMI.   Chest and Lung Exam Auscultation: Breath sounds:- clear at anterior chest wall and - clear at posterior chest wall. Adventitious sounds:- No  Adventitious sounds.   Cardiovascular Auscultation:Rhythm- Regular rate and rhythm. Heart Sounds- S1 WNL and S2 WNL. Murmurs & Other Heart Sounds:Auscultation of the heart reveals - No Murmurs.   Abdomen Palpation/Percussion:Tenderness- Abdomen is non-tender to palpation. Rigidity (guarding)- Abdomen is soft. Auscultation:Auscultation of the abdomen reveals - Bowel sounds normal.   Female Genitourinary Not done, not pertinent to present illness  Musculoskeletal On exam she is alert and oriented in no apparent distress. Her right knee show no effusion. She has marked crepitus on range of motion of the knee. She is tender medial greater than lateral with no instability.  RADIOGRAPHS: AP and lateral of right knee shows medial and patellofemoral degenerative changes.   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 Aluisio.  Plan is to go home following the hospital stay.  PCP - Dr. William Shaw - Patient has been seen preoperatively and felt to be stable for surgery.  The patient does not have any contraindications and will receive TXA (tranexamic acid) prior to surgery.  Signed electronically by Alexzandrew L Perkins, III PA-C 

## 2013-08-22 ENCOUNTER — Encounter (HOSPITAL_COMMUNITY)
Admission: RE | Admit: 2013-08-22 | Discharge: 2013-08-22 | Disposition: A | Discharge status: Home / Self Care | Payer: Medicare Other | Source: Ambulatory Visit | Attending: Orthopedic Surgery | Admitting: Orthopedic Surgery

## 2013-08-22 ENCOUNTER — Encounter (HOSPITAL_COMMUNITY): Payer: Self-pay

## 2013-08-22 DIAGNOSIS — Z01812 Encounter for preprocedural laboratory examination: Secondary | ICD-10-CM | POA: Insufficient documentation

## 2013-08-22 LAB — URINALYSIS, ROUTINE W REFLEX MICROSCOPIC
BILIRUBIN URINE: NEGATIVE
GLUCOSE, UA: NEGATIVE mg/dL
Ketones, ur: NEGATIVE mg/dL
Leukocytes, UA: NEGATIVE
Nitrite: NEGATIVE
Protein, ur: NEGATIVE mg/dL
SPECIFIC GRAVITY, URINE: 1.004 — AB (ref 1.005–1.030)
UROBILINOGEN UA: 0.2 mg/dL (ref 0.0–1.0)
pH: 7 (ref 5.0–8.0)

## 2013-08-22 LAB — SURGICAL PCR SCREEN
MRSA, PCR: NEGATIVE
STAPHYLOCOCCUS AUREUS: POSITIVE — AB

## 2013-08-22 LAB — CBC
HEMATOCRIT: 37 % (ref 36.0–46.0)
HEMOGLOBIN: 12.6 g/dL (ref 12.0–15.0)
MCH: 33.9 pg (ref 26.0–34.0)
MCHC: 34.1 g/dL (ref 30.0–36.0)
MCV: 99.5 fL (ref 78.0–100.0)
Platelets: 356 10*3/uL (ref 150–400)
RBC: 3.72 MIL/uL — ABNORMAL LOW (ref 3.87–5.11)
RDW: 12.7 % (ref 11.5–15.5)
WBC: 5.4 10*3/uL (ref 4.0–10.5)

## 2013-08-22 LAB — COMPREHENSIVE METABOLIC PANEL
ALT: 14 U/L (ref 0–35)
AST: 20 U/L (ref 0–37)
Albumin: 3.6 g/dL (ref 3.5–5.2)
Alkaline Phosphatase: 73 U/L (ref 39–117)
BUN: 12 mg/dL (ref 6–23)
CALCIUM: 9.3 mg/dL (ref 8.4–10.5)
CO2: 29 mEq/L (ref 19–32)
CREATININE: 0.63 mg/dL (ref 0.50–1.10)
Chloride: 98 mEq/L (ref 96–112)
GFR calc Af Amer: >90 mL/min (ref >90)
GFR calc non Af Amer: 89 mL/min — ABNORMAL LOW (ref >90)
GLUCOSE: 105 mg/dL — AB (ref 70–99)
Potassium: 4.9 mEq/L (ref 3.7–5.3)
Sodium: 135 mEq/L — ABNORMAL LOW (ref 137–147)
TOTAL PROTEIN: 6.8 g/dL (ref 6.0–8.3)
Total Bilirubin: 0.5 mg/dL (ref 0.3–1.2)

## 2013-08-22 LAB — URINE MICROSCOPIC-ADD ON

## 2013-08-22 LAB — PROTIME-INR
INR: 1.01 (ref 0.00–1.49)
Prothrombin Time: 13.1 seconds (ref 11.6–15.2)

## 2013-08-22 LAB — APTT: aPTT: 25 seconds (ref 24–37)

## 2013-08-22 NOTE — Progress Notes (Signed)
Faxed urine with micro and pos PCR to Dr Wynelle Link via EPIC.  Patient has Mupirocin ointment already- notified to begin today- verbalized understanding

## 2013-08-22 NOTE — Progress Notes (Signed)
Clearance Dr Brigitte Pulse chart

## 2013-08-22 NOTE — Patient Instructions (Signed)
      Your procedure is scheduled on:  08/27/13  MONDAY  Report to [place] at   0600    AM.  Call this number if you have problems the morning of surgery: [phone]        Do not eat food  Or drink :After Midnight. Sunday NIGHT   Take these medicines the morning of surgery with A SIP OF WATER:Flonase nasal spray   .  Contacts, dentures or partial plates, or metal hairpins  can not be worn to surgery. Your family will be responsible for glasses, dentures, hearing aides while you are in surgery  Leave suitcase in the car. After surgery it may be brought to your room.  For patients admitted to the hospital, checkout time is 11:00 AM day of  discharge.                DO NOT WEAR JEWELRY, LOTIONS, POWDERS, OR PERFUMES.  WOMEN-- DO NOT SHAVE LEGS OR UNDERARMS FOR 48 HOURS BEFORE SHOWERS. MEN MAY SHAVE FACE.  Patients discharged the day of surgery will not be allowed to drive home. IF going home the day of surgery, you must have a driver and someone to stay with you for the first 24 hours  Name and phone number of your driver:     [email or web address]                                                                   Please read over the following fact sheets that you were given: MRSA Information, Incentive Spirometry Sheet, Blood Transfusion Sheet  Information                     FAILURE TO [place]                                                  Patient Signature _____________________________

## 2013-08-27 ENCOUNTER — Encounter (HOSPITAL_COMMUNITY): Payer: Medicare Other | Admitting: Anesthesiology

## 2013-08-27 ENCOUNTER — Encounter (HOSPITAL_COMMUNITY)
Admission: RE | Disposition: A | Discharge status: Home Health | Payer: Self-pay | Source: Ambulatory Visit | Attending: Orthopedic Surgery

## 2013-08-27 ENCOUNTER — Inpatient Hospital Stay (HOSPITAL_COMMUNITY): Payer: Medicare Other | Admitting: Anesthesiology

## 2013-08-27 ENCOUNTER — Inpatient Hospital Stay (HOSPITAL_COMMUNITY)
Admission: RE | Admit: 2013-08-27 | Discharge: 2013-08-29 | DRG: 470 | Disposition: A | Discharge status: Home Health | Payer: Medicare Other | Source: Ambulatory Visit | Attending: Orthopedic Surgery | Admitting: Orthopedic Surgery

## 2013-08-27 ENCOUNTER — Encounter (HOSPITAL_COMMUNITY): Payer: Self-pay | Admitting: *Deleted

## 2013-08-27 DIAGNOSIS — M5137 Other intervertebral disc degeneration, lumbosacral region: Secondary | ICD-10-CM | POA: Diagnosis present

## 2013-08-27 DIAGNOSIS — Z96659 Presence of unspecified artificial knee joint: Secondary | ICD-10-CM

## 2013-08-27 DIAGNOSIS — Z8249 Family history of ischemic heart disease and other diseases of the circulatory system: Secondary | ICD-10-CM

## 2013-08-27 DIAGNOSIS — Z8262 Family history of osteoporosis: Secondary | ICD-10-CM

## 2013-08-27 DIAGNOSIS — D62 Acute posthemorrhagic anemia: Secondary | ICD-10-CM | POA: Diagnosis not present

## 2013-08-27 DIAGNOSIS — Z807 Family history of other malignant neoplasms of lymphoid, hematopoietic and related tissues: Secondary | ICD-10-CM

## 2013-08-27 DIAGNOSIS — M179 Osteoarthritis of knee, unspecified: Secondary | ICD-10-CM

## 2013-08-27 DIAGNOSIS — Z96651 Presence of right artificial knee joint: Secondary | ICD-10-CM

## 2013-08-27 DIAGNOSIS — Z833 Family history of diabetes mellitus: Secondary | ICD-10-CM

## 2013-08-27 DIAGNOSIS — Z79899 Other long term (current) drug therapy: Secondary | ICD-10-CM

## 2013-08-27 DIAGNOSIS — M1710 Unilateral primary osteoarthritis, unspecified knee: Principal | ICD-10-CM | POA: Diagnosis present

## 2013-08-27 DIAGNOSIS — H547 Unspecified visual loss: Secondary | ICD-10-CM | POA: Diagnosis present

## 2013-08-27 DIAGNOSIS — M4310 Spondylolisthesis, site unspecified: Secondary | ICD-10-CM | POA: Diagnosis present

## 2013-08-27 DIAGNOSIS — M51379 Other intervertebral disc degeneration, lumbosacral region without mention of lumbar back pain or lower extremity pain: Secondary | ICD-10-CM | POA: Diagnosis present

## 2013-08-27 HISTORY — PX: TOTAL KNEE ARTHROPLASTY: SHX125

## 2013-08-27 LAB — TYPE AND SCREEN
ABO/RH(D): A POS
ANTIBODY SCREEN: NEGATIVE

## 2013-08-27 SURGERY — ARTHROPLASTY, KNEE, TOTAL
Anesthesia: Spinal | Site: Knee | Laterality: Right

## 2013-08-27 MED ORDER — EPHEDRINE SULFATE 50 MG/ML IJ SOLN
INTRAMUSCULAR | Status: AC
  Filled 2013-08-27: qty 1

## 2013-08-27 MED ORDER — FENTANYL CITRATE 0.05 MG/ML IJ SOLN
INTRAMUSCULAR | Status: DC | PRN
  Administered 2013-08-27: 100 ug via INTRAVENOUS

## 2013-08-27 MED ORDER — DEXAMETHASONE SODIUM PHOSPHATE 10 MG/ML IJ SOLN
10.0000 mg | Freq: Every day | INTRAMUSCULAR | Status: AC
  Filled 2013-08-27: qty 1

## 2013-08-27 MED ORDER — POLYVINYL ALCOHOL 1.4 % OP SOLN
1.0000 [drp] | Freq: Every day | OPHTHALMIC | Status: DC
  Administered 2013-08-28: 1 [drp] via OPHTHALMIC
  Filled 2013-08-27: qty 15

## 2013-08-27 MED ORDER — PROMETHAZINE HCL 25 MG/ML IJ SOLN: 6.2500 mg | INTRAMUSCULAR | Status: DC | PRN

## 2013-08-27 MED ORDER — SODIUM CHLORIDE 0.9 % IV SOLN
INTRAVENOUS | Status: DC
  Administered 2013-08-27 (×2): via INTRAVENOUS

## 2013-08-27 MED ORDER — VANCOMYCIN HCL IN DEXTROSE 1-5 GM/200ML-% IV SOLN
1000.0000 mg | Freq: Two times a day (BID) | INTRAVENOUS | Status: AC
  Administered 2013-08-27: 1000 mg via INTRAVENOUS
  Filled 2013-08-27: qty 200

## 2013-08-27 MED ORDER — FENTANYL CITRATE 0.05 MG/ML IJ SOLN
INTRAMUSCULAR | Status: AC
  Filled 2013-08-27: qty 2

## 2013-08-27 MED ORDER — ONDANSETRON HCL 4 MG PO TABS
4.0000 mg | ORAL_TABLET | Freq: Four times a day (QID) | ORAL | Status: DC | PRN
  Administered 2013-08-28: 4 mg via ORAL
  Filled 2013-08-27: qty 1

## 2013-08-27 MED ORDER — HYDROMORPHONE HCL PF 1 MG/ML IJ SOLN
0.5000 mg | INTRAMUSCULAR | Status: DC | PRN
Start: 2013-08-27 — End: 2013-08-29
  Administered 2013-08-27: 0.5 mg via INTRAVENOUS
  Filled 2013-08-27: qty 1

## 2013-08-27 MED ORDER — BISACODYL 10 MG RE SUPP: 10.0000 mg | Freq: Every day | RECTAL | Status: DC | PRN

## 2013-08-27 MED ORDER — OXYCODONE HCL 5 MG PO TABS
5.0000 mg | ORAL_TABLET | ORAL | Status: DC | PRN
  Administered 2013-08-27: 5 mg via ORAL
  Administered 2013-08-27 (×3): 10 mg via ORAL
  Administered 2013-08-27: 5 mg via ORAL
  Administered 2013-08-28 – 2013-08-29 (×9): 10 mg via ORAL
  Filled 2013-08-27: qty 2
  Filled 2013-08-27: qty 1
  Filled 2013-08-27 (×3): qty 2
  Filled 2013-08-27: qty 1
  Filled 2013-08-27 (×9): qty 2

## 2013-08-27 MED ORDER — METOCLOPRAMIDE HCL 10 MG PO TABS: 5.0000 mg | ORAL_TABLET | Freq: Three times a day (TID) | ORAL | Status: DC | PRN

## 2013-08-27 MED ORDER — ACETAMINOPHEN 650 MG RE SUPP: 650.0000 mg | Freq: Four times a day (QID) | RECTAL | Status: DC | PRN

## 2013-08-27 MED ORDER — HYPROMELLOSE 0.3 % OP SOLN: 1.0000 [drp] | Freq: Every day | OPHTHALMIC | Status: DC

## 2013-08-27 MED ORDER — ACETAMINOPHEN 10 MG/ML IV SOLN
1000.0000 mg | Freq: Once | INTRAVENOUS | Status: AC
  Administered 2013-08-27: 1000 mg via INTRAVENOUS
  Filled 2013-08-27: qty 100

## 2013-08-27 MED ORDER — DOCUSATE SODIUM 100 MG PO CAPS
100.0000 mg | ORAL_CAPSULE | Freq: Two times a day (BID) | ORAL | Status: DC
  Administered 2013-08-27 – 2013-08-29 (×4): 100 mg via ORAL

## 2013-08-27 MED ORDER — BUPIVACAINE IN DEXTROSE 0.75-8.25 % IT SOLN
INTRATHECAL | Status: DC | PRN
  Administered 2013-08-27: 1.8 mL via INTRATHECAL

## 2013-08-27 MED ORDER — SUCCINYLCHOLINE CHLORIDE 20 MG/ML IJ SOLN
INTRAMUSCULAR | Status: AC
  Filled 2013-08-27: qty 1

## 2013-08-27 MED ORDER — SODIUM CHLORIDE 0.9 % IJ SOLN
INTRAMUSCULAR | Status: AC
  Filled 2013-08-27: qty 50

## 2013-08-27 MED ORDER — ONDANSETRON HCL 4 MG/2ML IJ SOLN
4.0000 mg | Freq: Four times a day (QID) | INTRAMUSCULAR | Status: DC | PRN
  Administered 2013-08-28: 4 mg via INTRAVENOUS
  Filled 2013-08-27: qty 2

## 2013-08-27 MED ORDER — KETAMINE HCL 10 MG/ML IJ SOLN
INTRAMUSCULAR | Status: DC | PRN
  Administered 2013-08-27 (×2): 20 mg via INTRAVENOUS

## 2013-08-27 MED ORDER — DEXAMETHASONE SODIUM PHOSPHATE 10 MG/ML IJ SOLN
10.0000 mg | Freq: Once | INTRAMUSCULAR | Status: AC
  Administered 2013-08-27: 10 mg via INTRAVENOUS

## 2013-08-27 MED ORDER — BUPIVACAINE HCL 0.25 % IJ SOLN
INTRAMUSCULAR | Status: DC | PRN
  Administered 2013-08-27: 20 mL

## 2013-08-27 MED ORDER — VANCOMYCIN HCL 1000 MG IV SOLR
1000.0000 mg | INTRAVENOUS | Status: AC
  Administered 2013-08-27: 1000 mg via INTRAVENOUS
  Filled 2013-08-27: qty 1000

## 2013-08-27 MED ORDER — DEXAMETHASONE SODIUM PHOSPHATE 10 MG/ML IJ SOLN
INTRAMUSCULAR | Status: AC
  Filled 2013-08-27: qty 1

## 2013-08-27 MED ORDER — ONDANSETRON HCL 4 MG/2ML IJ SOLN
INTRAMUSCULAR | Status: AC
  Filled 2013-08-27: qty 2

## 2013-08-27 MED ORDER — MIDAZOLAM HCL 2 MG/2ML IJ SOLN
INTRAMUSCULAR | Status: AC
  Filled 2013-08-27: qty 2

## 2013-08-27 MED ORDER — SODIUM CHLORIDE 0.9 % IV SOLN: INTRAVENOUS | Status: DC

## 2013-08-27 MED ORDER — PROPOFOL 10 MG/ML IV BOLUS
INTRAVENOUS | Status: AC
  Filled 2013-08-27: qty 20

## 2013-08-27 MED ORDER — SODIUM CHLORIDE 0.9 % IR SOLN
Status: DC | PRN
Start: 2013-08-27 — End: 2013-08-27
  Administered 2013-08-27: 1000 mL

## 2013-08-27 MED ORDER — RIVAROXABAN 10 MG PO TABS
10.0000 mg | ORAL_TABLET | Freq: Every day | ORAL | Status: DC
  Administered 2013-08-28 – 2013-08-29 (×2): 10 mg via ORAL
  Filled 2013-08-27 (×4): qty 1

## 2013-08-27 MED ORDER — VANCOMYCIN HCL IN DEXTROSE 1-5 GM/200ML-% IV SOLN: 1000.0000 mg | INTRAVENOUS | Status: DC

## 2013-08-27 MED ORDER — FLUTICASONE PROPIONATE 50 MCG/ACT NA SUSP
1.0000 | Freq: Every day | NASAL | Status: DC
  Administered 2013-08-28: 1 via NASAL
  Filled 2013-08-27: qty 16

## 2013-08-27 MED ORDER — MIDAZOLAM HCL 5 MG/5ML IJ SOLN
INTRAMUSCULAR | Status: DC | PRN
  Administered 2013-08-27 (×2): 1 mg via INTRAVENOUS

## 2013-08-27 MED ORDER — SODIUM CHLORIDE 0.9 % IJ SOLN
INTRAMUSCULAR | Status: DC | PRN
  Administered 2013-08-27: 09:00:00

## 2013-08-27 MED ORDER — KETAMINE HCL 10 MG/ML IJ SOLN
INTRAMUSCULAR | Status: AC
  Filled 2013-08-27: qty 1

## 2013-08-27 MED ORDER — MENTHOL 3 MG MT LOZG
1.0000 | LOZENGE | OROMUCOSAL | Status: DC | PRN
  Filled 2013-08-27: qty 9

## 2013-08-27 MED ORDER — DEXTROSE 5 % IV SOLN
500.0000 mg | Freq: Four times a day (QID) | INTRAVENOUS | Status: DC | PRN
  Administered 2013-08-27: 500 mg via INTRAVENOUS
  Filled 2013-08-27: qty 5

## 2013-08-27 MED ORDER — SODIUM CHLORIDE 0.9 % IJ SOLN
INTRAMUSCULAR | Status: AC
  Filled 2013-08-27: qty 10

## 2013-08-27 MED ORDER — LACTATED RINGERS IV SOLN
INTRAVENOUS | Status: DC | PRN
  Administered 2013-08-27 (×2): via INTRAVENOUS

## 2013-08-27 MED ORDER — FLEET ENEMA 7-19 GM/118ML RE ENEM: 1.0000 | ENEMA | Freq: Once | RECTAL | Status: AC | PRN

## 2013-08-27 MED ORDER — BUPIVACAINE HCL (PF) 0.25 % IJ SOLN
INTRAMUSCULAR | Status: AC
  Filled 2013-08-27: qty 30

## 2013-08-27 MED ORDER — DIPHENHYDRAMINE HCL 12.5 MG/5ML PO ELIX: 12.5000 mg | ORAL_SOLUTION | ORAL | Status: DC | PRN

## 2013-08-27 MED ORDER — ACETAMINOPHEN 325 MG PO TABS
650.0000 mg | ORAL_TABLET | Freq: Four times a day (QID) | ORAL | Status: DC | PRN
  Administered 2013-08-29: 650 mg via ORAL
  Filled 2013-08-27: qty 2

## 2013-08-27 MED ORDER — PROPOFOL INFUSION 10 MG/ML OPTIME
INTRAVENOUS | Status: DC | PRN
  Administered 2013-08-27: 100 ug/kg/min via INTRAVENOUS

## 2013-08-27 MED ORDER — 0.9 % SODIUM CHLORIDE (POUR BTL) OPTIME
TOPICAL | Status: DC | PRN
  Administered 2013-08-27: 1000 mL

## 2013-08-27 MED ORDER — LORATADINE 10 MG PO TABS
10.0000 mg | ORAL_TABLET | Freq: Every day | ORAL | Status: DC
  Administered 2013-08-28 (×2): 10 mg via ORAL
  Filled 2013-08-27 (×3): qty 1

## 2013-08-27 MED ORDER — DEXAMETHASONE 4 MG PO TABS
10.0000 mg | ORAL_TABLET | Freq: Every day | ORAL | Status: AC
  Filled 2013-08-27: qty 1

## 2013-08-27 MED ORDER — ACETAMINOPHEN 500 MG PO TABS
1000.0000 mg | ORAL_TABLET | Freq: Four times a day (QID) | ORAL | Status: AC
  Administered 2013-08-27 – 2013-08-28 (×4): 1000 mg via ORAL
  Filled 2013-08-27 (×4): qty 2

## 2013-08-27 MED ORDER — PHENOL 1.4 % MT LIQD: 1.0000 | OROMUCOSAL | Status: DC | PRN

## 2013-08-27 MED ORDER — MORPHINE SULFATE 2 MG/ML IJ SOLN: 1.0000 mg | INTRAMUSCULAR | Status: DC | PRN

## 2013-08-27 MED ORDER — ATROPINE SULFATE 0.4 MG/ML IJ SOLN
INTRAMUSCULAR | Status: AC
  Filled 2013-08-27: qty 1

## 2013-08-27 MED ORDER — METHOCARBAMOL 500 MG PO TABS
500.0000 mg | ORAL_TABLET | Freq: Four times a day (QID) | ORAL | Status: DC | PRN
  Administered 2013-08-27 – 2013-08-29 (×5): 500 mg via ORAL
  Filled 2013-08-27 (×5): qty 1

## 2013-08-27 MED ORDER — BUPIVACAINE LIPOSOME 1.3 % IJ SUSP
20.0000 mL | Freq: Once | INTRAMUSCULAR | Status: DC
  Filled 2013-08-27: qty 20

## 2013-08-27 MED ORDER — METOCLOPRAMIDE HCL 5 MG/ML IJ SOLN
5.0000 mg | Freq: Three times a day (TID) | INTRAMUSCULAR | Status: DC | PRN
  Administered 2013-08-27: 10 mg via INTRAVENOUS
  Filled 2013-08-27: qty 2

## 2013-08-27 MED ORDER — ONDANSETRON HCL 4 MG/2ML IJ SOLN
INTRAMUSCULAR | Status: DC | PRN
  Administered 2013-08-27: 4 mg via INTRAVENOUS

## 2013-08-27 MED ORDER — HYDROMORPHONE HCL PF 1 MG/ML IJ SOLN: 0.2500 mg | INTRAMUSCULAR | Status: DC | PRN

## 2013-08-27 MED ORDER — POLYETHYLENE GLYCOL 3350 17 G PO PACK: 17.0000 g | PACK | Freq: Every day | ORAL | Status: DC | PRN

## 2013-08-27 MED ORDER — TRAMADOL HCL 50 MG PO TABS: 50.0000 mg | ORAL_TABLET | Freq: Four times a day (QID) | ORAL | Status: DC | PRN

## 2013-08-27 MED ORDER — EPHEDRINE SULFATE 50 MG/ML IJ SOLN
INTRAMUSCULAR | Status: DC | PRN
  Administered 2013-08-27 (×2): 5 mg via INTRAVENOUS
  Administered 2013-08-27 (×2): 10 mg via INTRAVENOUS

## 2013-08-27 MED ORDER — TRANEXAMIC ACID 100 MG/ML IV SOLN
1000.0000 mg | INTRAVENOUS | Status: AC
  Administered 2013-08-27: 1000 mg via INTRAVENOUS
  Filled 2013-08-27: qty 10

## 2013-08-27 SURGICAL SUPPLY — 55 items
BAG SPEC THK2 15X12 ZIP CLS (MISCELLANEOUS)
BAG ZIPLOCK 12X15 (MISCELLANEOUS) ×1 IMPLANT
BANDAGE ELASTIC 6 VELCRO ST LF (GAUZE/BANDAGES/DRESSINGS) ×2 IMPLANT
BANDAGE ESMARK 6X9 LF (GAUZE/BANDAGES/DRESSINGS) ×1 IMPLANT
BLADE SAG 18X100X1.27 (BLADE) ×2 IMPLANT
BLADE SAW SGTL 11.0X1.19X90.0M (BLADE) ×2 IMPLANT
BNDG CMPR 9X6 STRL LF SNTH (GAUZE/BANDAGES/DRESSINGS) ×1
BNDG ESMARK 6X9 LF (GAUZE/BANDAGES/DRESSINGS) ×2
BOWL SMART MIX CTS (DISPOSABLE) ×2 IMPLANT
CAPT RP KNEE ×1 IMPLANT
CEMENT HV SMART SET (Cement) ×4 IMPLANT
CUFF TOURN SGL QUICK 34 (TOURNIQUET CUFF) ×2
CUFF TRNQT CYL 34X4X40X1 (TOURNIQUET CUFF) ×1 IMPLANT
DECANTER SPIKE VIAL GLASS SM (MISCELLANEOUS) ×2 IMPLANT
DRAPE EXTREMITY T 121X128X90 (DRAPE) ×2 IMPLANT
DRAPE POUCH INSTRU U-SHP 10X18 (DRAPES) ×2 IMPLANT
DRAPE U-SHAPE 47X51 STRL (DRAPES) ×2 IMPLANT
DRSG ADAPTIC 3X8 NADH LF (GAUZE/BANDAGES/DRESSINGS) ×2 IMPLANT
DURAPREP 26ML APPLICATOR (WOUND CARE) ×2 IMPLANT
ELECT REM PT RETURN 9FT ADLT (ELECTROSURGICAL) ×2
ELECTRODE REM PT RTRN 9FT ADLT (ELECTROSURGICAL) ×1 IMPLANT
EVACUATOR 1/8 PVC DRAIN (DRAIN) ×2 IMPLANT
FACESHIELD LNG OPTICON STERILE (SAFETY) ×10 IMPLANT
GLOVE BIO SURGEON STRL SZ7.5 (GLOVE) ×2 IMPLANT
GLOVE BIO SURGEON STRL SZ8 (GLOVE) ×2 IMPLANT
GLOVE BIOGEL PI IND STRL 8 (GLOVE) ×2 IMPLANT
GLOVE BIOGEL PI INDICATOR 8 (GLOVE) ×2
GOWN STRL REUS W/TWL LRG LVL3 (GOWN DISPOSABLE) ×4 IMPLANT
GOWN STRL REUS W/TWL XL LVL3 (GOWN DISPOSABLE) ×4 IMPLANT
HANDPIECE INTERPULSE COAX TIP (DISPOSABLE) ×2
IMMOBILIZER KNEE 20 (SOFTGOODS) ×2 IMPLANT
KIT BASIN OR (CUSTOM PROCEDURE TRAY) ×2 IMPLANT
MANIFOLD NEPTUNE II (INSTRUMENTS) ×2 IMPLANT
NDL SAFETY ECLIPSE 18X1.5 (NEEDLE) ×2 IMPLANT
NEEDLE HYPO 18GX1.5 SHARP (NEEDLE) ×4
NS IRRIG 1000ML POUR BTL (IV SOLUTION) ×2 IMPLANT
PACK TOTAL JOINT (CUSTOM PROCEDURE TRAY) ×2 IMPLANT
PAD ABD 8X10 STRL (GAUZE/BANDAGES/DRESSINGS) ×2 IMPLANT
PADDING CAST COTTON 6X4 STRL (CAST SUPPLIES) ×6 IMPLANT
POSITIONER SURGICAL ARM (MISCELLANEOUS) ×2 IMPLANT
SET HNDPC FAN SPRY TIP SCT (DISPOSABLE) ×1 IMPLANT
SPONGE GAUZE 4X4 12PLY (GAUZE/BANDAGES/DRESSINGS) ×2 IMPLANT
STRIP CLOSURE SKIN 1/2X4 (GAUZE/BANDAGES/DRESSINGS) ×4 IMPLANT
SUCTION FRAZIER 12FR DISP (SUCTIONS) ×2 IMPLANT
SUT MNCRL AB 4-0 PS2 18 (SUTURE) ×2 IMPLANT
SUT VIC AB 2-0 CT1 27 (SUTURE) ×6
SUT VIC AB 2-0 CT1 TAPERPNT 27 (SUTURE) ×3 IMPLANT
SUT VLOC 180 0 24IN GS25 (SUTURE) ×2 IMPLANT
SYR 20CC LL (SYRINGE) ×2 IMPLANT
SYR 50ML LL SCALE MARK (SYRINGE) ×2 IMPLANT
TOWEL OR 17X26 10 PK STRL BLUE (TOWEL DISPOSABLE) ×3 IMPLANT
TOWEL OR NON WOVEN STRL DISP B (DISPOSABLE) ×2 IMPLANT
TRAY FOLEY CATH 14FRSI W/METER (CATHETERS) ×2 IMPLANT
WATER STERILE IRR 1500ML POUR (IV SOLUTION) ×2 IMPLANT
WRAP KNEE MAXI GEL POST OP (GAUZE/BANDAGES/DRESSINGS) ×2 IMPLANT

## 2013-08-27 NOTE — H&P (View-Only) (Signed)
Maria Jackson. Maria Jackson  DOB: Aug 25, 1945 Married / Language: English / Race: White Female  Date of Admission:  08-27-2013  Chief Complaint:  Right Knee Pain  History of Present Illness The patient is a 68 year old female who comes in for a preoperative History and Physical. The patient is scheduled for a right total knee arthroplasty to be performed by Dr. Dione Plover. Aluisio, MD at [facility] on 08-27-2013. The patient is a 68 year old female presenting for a post-operative visit. The patient comes in today several months out from left total knee arthroplasty. The patient states that she is doing very well at this time. The pain is under excellent control at this time and describe their pain as mild. They are currently on no medication for their pain. The patient is currently doing home exercise program. The patient feels that they are progressing well at this time. She is slowly increasing her resistance on the bike and elliptical. She still has some soreness in the knee but no pain. She tends to have more soreness laterally. Overall she is going well. She is comng in for her second knee replacement, the right knee. She is ready to proceed. They have been treated conservatively in the past for the above stated problem and despite conservative measures, they continue to have progressive pain and severe functional limitations and dysfunction. They have failed non-operative management including home exercise, medications, and injections. It is felt that they would benefit from undergoing total joint replacement. Risks and benefits of the procedure have been discussed with the patient and they elect to proceed with surgery. There are no active contraindications to surgery such as ongoing infection or rapidly progressive neurological disease.  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 03/29/1994 Pain in joint, ankle/foot (719.47). 03/25/2006 Enthesopathy, hip (726.5). 03/25/2006 Sprain/strain, ankle NOS (845.00). 03/25/2006 Impaired Vision Tinnitus Hemorrhoids Degenerative Disc Diseas   Family History Bleeding disorder. grandfather fathers side Osteoporosis. mother Cancer. Father. multiple myeloma mother, father and brother Diabetes Mellitus. Mother. mother Heart Disease. father, brother and grandmother mothers side    Social History Illicit drug use. no Pain Contract. no Living situation. live with spouse Previously in rehab. no Drug/Alcohol Rehab (Currently). no Tobacco / smoke exposure. no Exercise. Exercises daily; does running / walking Tobacco use. Never smoker. never smoker Alcohol use. Drinks wine. current drinker; drinks wine; less than 5 per week Marital status. married Current work status. retired Number of flights of stairs before winded. greater than 5 Children. 2 Post-Surgical Plans. Home Advance Directives. Living Will, Healthcare POA    Medication History Fluticasone Propionate (50MCG/ACT Suspension, Nasal) Active. Centrum Silver ( Oral) Active. Fish Oil Burp-Less ( Oral) Specific dose unknown - Active. Aspirin EC (81MG Tablet DR, Oral) Active. Magnesium (250MG Tablet, Oral) Active. ZyrTEC Allergy (10MG Tablet, Oral) Active. GenTeal ( Ophthalmic) Active.    Past Surgical History Knee Replacement, Total. 04/27/2013 Left. Tonsillectomy. Date: 50. Tubal Ligation Gallbladder Surgery Hysterectomy (not due to cancer) - Complete   Review of Systems General:Not Present- Chills, Fever, Night  Sweats, Fatigue, Weight Gain, Weight Loss and Memory Loss. Skin:Not Present- Hives, Itching, Rash, Eczema and Lesions. HEENT:Present- Tinnitus. Not Present- Headache, Double Vision, Visual Loss, Hearing Loss and  Dentures. Respiratory:Not Present- Shortness of breath with exertion, Shortness of breath at rest, Allergies, Coughing up blood and Chronic Cough. Cardiovascular:Not Present- Chest Pain, Racing/skipping heartbeats, Difficulty Breathing Lying Down, Murmur, Swelling and Palpitations. Gastrointestinal:Not Present- Bloody Stool, Heartburn, Abdominal Pain, Vomiting, Nausea, Constipation, Diarrhea, Difficulty Swallowing, Jaundice and Loss of appetitie. Female Genitourinary:Not Present- Blood in Urine, Urinary frequency, Weak urinary stream, Discharge, Flank Pain, Incontinence, Painful Urination, Urgency, Urinary Retention and Urinating at Night. Musculoskeletal:Present- Joint Swelling and Joint Pain. Not Present- Muscle Weakness, Muscle Pain, Back Pain, Morning Stiffness and Spasms. Neurological:Not Present- Tremor, Dizziness, Blackout spells, Paralysis, Difficulty with balance and Weakness. Psychiatric:Not Present- Insomnia.    Vitals 07/31/2013 12:00 PM BP: 124/72 (Sitting, Left Arm, Standard)     Physical Exam The physical exam findings are as follows:   General Mental Status - Alert, cooperative and good historian. General Appearance- pleasant. Not in acute distress. Orientation- Oriented X3. Build & Nutrition- Well nourished and Well developed.   Head and Neck Head- normocephalic, atraumatic . Neck Global Assessment- supple. no bruit auscultated on the right and no bruit auscultated on the left.   Eye Vision- Wears contact lenses. Pupil- Bilateral- Regular and Round. Motion- Bilateral- EOMI.   Chest and Lung Exam Auscultation: Breath sounds:- clear at anterior chest wall and - clear at posterior chest wall. Adventitious sounds:- No  Adventitious sounds.   Cardiovascular Auscultation:Rhythm- Regular rate and rhythm. Heart Sounds- S1 WNL and S2 WNL. Murmurs & Other Heart Sounds:Auscultation of the heart reveals - No Murmurs.   Abdomen Palpation/Percussion:Tenderness- Abdomen is non-tender to palpation. Rigidity (guarding)- Abdomen is soft. Auscultation:Auscultation of the abdomen reveals - Bowel sounds normal.   Female Genitourinary Not done, not pertinent to present illness  Musculoskeletal On exam she is alert and oriented in no apparent distress. Her right knee show no effusion. She has marked crepitus on range of motion of the knee. She is tender medial greater than lateral with no instability.  RADIOGRAPHS: AP and lateral of right knee shows medial and patellofemoral degenerative changes.   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 Wynelle Link.  Plan is to go home following the hospital stay.  PCP - Dr. Marton Redwood - Patient has been seen preoperatively and felt to be stable for surgery.  The patient does not have any contraindications and will receive TXA (tranexamic acid) prior to surgery.  Signed electronically by Joelene Millin, III PA-C

## 2013-08-27 NOTE — Plan of Care (Signed)
Problem: Consults Goal: Diagnosis- Total Joint Replacement Primary Total Knee     

## 2013-08-27 NOTE — Transfer of Care (Signed)
Immediate Anesthesia Transfer of Care Note  Patient: Maria Jackson  Procedure(s) Performed: Procedure(s): RIGHT TOTAL KNEE ARTHROPLASTY (Right)  Patient Location: PACU  Anesthesia Type:Spinal  Level of Consciousness: awake, alert , oriented and patient cooperative  Airway & Oxygen Therapy: Patient Spontanous Breathing and Patient connected to face mask oxygen  Post-op Assessment: Report given to PACU RN, Post -op Vital signs reviewed and stable and SAB level T10.  Post vital signs: Reviewed and stable  Complications: No apparent anesthesia complications

## 2013-08-27 NOTE — Op Note (Signed)
Pre-operative diagnosis- Osteoarthritis  Right knee(s)  Post-operative diagnosis- Osteoarthritis Right knee(s)  Procedure-  Right  Total Knee Arthroplasty  Surgeon- Dione Plover. Joeanna Howdyshell, MD  Assistant- Arlee Muslim, PA-C   Anesthesia-  Spinal EBL-* No blood loss amount entered *  Drains Hemovac  Tourniquet time-  Total Tourniquet Time Documented: Thigh (Right) - 29 minutes Total: Thigh (Right) - 29 minutes    Complications- None  Condition-PACU - hemodynamically stable.   Brief Clinical Note  Maria Jackson is a 68 y.o. year old female with end stage OA of her right knee with progressively worsening pain and dysfunction. She has constant pain, with activity and at rest and significant functional deficits with difficulties even with ADLs. She has had extensive non-op management including analgesics, injections of cortisone and viscosupplements, and home exercise program, but remains in significant pain with significant dysfunction.Radiographs show bone on bone arthritis medial and patellofemoral. She presents now for right Total Knee Arthroplasty.    Procedure in detail---   The patient is brought into the operating room and positioned supine on the operating table. After successful administration of  Spinal,   a tourniquet is placed high on the  Right thigh(s) and the lower extremity is prepped and draped in the usual sterile fashion. Time out is performed by the operating team and then the  Right lower extremity is wrapped in Esmarch, knee flexed and the tourniquet inflated to 300 mmHg.       A midline incision is made with a ten blade through the subcutaneous tissue to the level of the extensor mechanism. A fresh blade is used to make a medial parapatellar arthrotomy. Soft tissue over the proximal medial tibia is subperiosteally elevated to the joint line with a knife and into the semimembranosus bursa with a Cobb elevator. Soft tissue over the proximal lateral tibia is elevated with  attention being paid to avoiding the patellar tendon on the tibial tubercle. The patella is everted, knee flexed 90 degrees and the ACL and PCL are removed. Findings are bone on bone medial and patellofemoral with exposed bone all 3 compartments.        The drill is used to create a starting hole in the distal femur and the canal is thoroughly irrigated with sterile saline to remove the fatty contents. The 5 degree Right  valgus alignment guide is placed into the femoral canal and the distal femoral cutting block is pinned to remove 10 mm off the distal femur. Resection is made with an oscillating saw.      The tibia is subluxed forward and the menisci are removed. The extramedullary alignment guide is placed referencing proximally at the medial aspect of the tibial tubercle and distally along the second metatarsal axis and tibial crest. The block is pinned to remove 90mm off the more deficient medial  side. Resection is made with an oscillating saw. Size 2.5is the most appropriate size for the tibia and the proximal tibia is prepared with the modular drill and keel punch for that size.      The femoral sizing guide is placed and size 2.5 is most appropriate. Rotation is marked off the epicondylar axis and confirmed by creating a rectangular flexion gap at 90 degrees. The size 2.5 cutting block is pinned in this rotation and the anterior, posterior and chamfer cuts are made with the oscillating saw. The intercondylar block is then placed and that cut is made.      Trial size 2.5 tibial component, trial size  2.5 posterior stabilized femur and a 10  mm posterior stabilized rotating platform insert trial is placed. Full extension is achieved with excellent varus/valgus and anterior/posterior balance throughout full range of motion. The patella is everted and thickness measured to be 22  mm. Free hand resection is taken to 12 mm, a 35 template is placed, lug holes are drilled, trial patella is placed, and it tracks  normally. Osteophytes are removed off the posterior femur with the trial in place. All trials are removed and the cut bone surfaces prepared with pulsatile lavage. Cement is mixed and once ready for implantation, the size 2.5 tibial implant, size  2.5 posterior stabilized femoral component, and the size 35 patella are cemented in place and the patella is held with the clamp. The trial insert is placed and the knee held in full extension. The Exparel (20 ml mixed with 30 ml saline) and .25% Bupivicaine, are injected into the extensor mechanism, posterior capsule, medial and lateral gutters and subcutaneous tissues.  All extruded cement is removed and once the cement is hard the permanent 10 mm posterior stabilized rotating platform insert is placed into the tibial tray.      The wound is copiously irrigated with saline solution and the extensor mechanism closed over a hemovac drain with #1 PDS suture. The tourniquet is released for a total tourniquet time of 29 minutes. Flexion against gravity is 140 degrees and the patella tracks normally. Subcutaneous tissue is closed with 2.0 vicryl and subcuticular with running 4.0 Monocryl. The incision is cleaned and dried and steri-strips and a bulky sterile dressing are applied. The limb is placed into a knee immobilizer and the patient is awakened and transported to recovery in stable condition.      Please note that a surgical assistant was a medical necessity for this procedure in order to perform it in a safe and expeditious manner. Surgical assistant was necessary to retract the ligaments and vital neurovascular structures to prevent injury to them and also necessary for proper positioning of the limb to allow for anatomic placement of the prosthesis.   Dione Plover Maisey Deandrade, MD    08/27/2013, 9:07 AM

## 2013-08-27 NOTE — Progress Notes (Signed)
Utilization review completed.  

## 2013-08-27 NOTE — Evaluation (Addendum)
Physical Therapy Evaluation Patient Details Name: Maria Jackson MRN: [id number] DOB: [birth year] Today's Date: 08/27/2013 Time: 4332-9518 PT Time Calculation (min): 14 min  PT Assessment / Plan / Recommendation History of Present Illness  s/p R TKA  Clinical Impression  Pt will benefit form PT to address deficits below; Pt reports she does not want OT to see her this time (recent TKA) husband can assist if needed    PT Assessment       Follow Up Recommendations  Home health PT    Does the patient have the potential to tolerate intense rehabilitation      Barriers to Discharge        Equipment Recommendations  None recommended by PT    Recommendations for Other Services     Frequency      Precautions / Restrictions Precautions Precautions: Knee Required Braces or Orthoses: Knee Immobilizer - Right Knee Immobilizer - Right: Discontinue once straight leg raise with < 10 degree lag (I SLR today) Restrictions Other Position/Activity Restrictions: WBAT   Pertinent Vitals/Pain VSS      Mobility  Bed Mobility Overal bed mobility: Needs Assistance Bed Mobility: Supine to Sit Supine to sit: Supervision General bed mobility comments: for safety Transfers Overall transfer level: Needs assistance Equipment used: Rolling walker (2 wheeled) Transfers: Sit to/from Stand Sit to Stand: Min guard General transfer comment: cues for hand placement Ambulation/Gait Ambulation/Gait assistance: Supervision Ambulation Distance (Feet): 70 Feet Assistive device: Rolling walker (2 wheeled) Gait Pattern/deviations: Step-to pattern;Step-through pattern General Gait Details: cues for RW safety    Exercises Total Joint Exercises Ankle Circles/Pumps: AROM;Both;5 reps Quad Sets: 5 reps;Both;AROM   PT Diagnosis:    PT Problem List:   PT Treatment Interventions:       PT Goals(Current goals can be found in the care plan section) Acute Rehab PT Goals Patient Stated Goal: I PT  Goal Formulation: With patient Time For Goal Achievement: 09/03/13 Potential to Achieve Goals: Good  Visit Information  Last PT Received On: 08/27/13 Assistance Needed: +1 History of Present Illness: s/p R TKA       Prior Functioning  Home Living Family/patient expects to be discharged to:: Private residence Living Arrangements: Spouse/significant other Available Help at Discharge: Family;Available 24 hours/day Type of Home: House Home Access: Stairs to enter Entrance Stairs-Rails: Right;Left Home Layout: Able to live on main level with bedroom/bathroom Home Equipment: Walker - 2 wheels;Shower seat;Cane - single point Additional Comments: Bathroom on main level is not near bedroom. Prior Function Level of Independence: Independent;Independent with assistive device(s) Communication Communication: No difficulties    Cognition  Cognition Arousal/Alertness: Awake/alert Behavior During Therapy: WFL for tasks assessed/performed Overall Cognitive Status: Within Functional Limits for tasks assessed    Extremity/Trunk Assessment Upper Extremity Assessment Upper Extremity Assessment: Overall WFL for tasks assessed Lower Extremity Assessment Lower Extremity Assessment: RLE deficits/detail RLE Deficits / Details: AROM to 90 degrees; able to do SLR   Balance    End of Session PT - End of Session Equipment Utilized During Treatment: Gait belt Activity Tolerance: Patient tolerated treatment well Patient left: in chair;with call bell/phone within reach;with family/visitor present Nurse Communication: Mobility status  GP     [facility] 08/27/2013, 3:17 PM

## 2013-08-27 NOTE — Anesthesia Procedure Notes (Signed)
Spinal  Patient location during procedure: OR Start time: 08/27/2013 8:10 AM End time: 08/27/2013 8:16 AM Staffing CRNA/Resident: Alfonso Patten J Performed by: resident/CRNA  Preanesthetic Checklist Completed: patient identified, site marked, surgical consent, pre-op evaluation, timeout performed, IV checked and monitors and equipment checked Spinal Block Patient position: sitting Prep: Betadine and site prepped and draped Patient monitoring: heart rate, continuous pulse ox and blood pressure Approach: midline Location: L3-4 Injection technique: single-shot Needle Needle type: Sprotte  Needle gauge: 24 G Needle length: 9 cm Additional Notes Lot #[id number]  Exp date 09/2014.   No c/o pain/paresthesia with procedure.  Pt tolerated well.

## 2013-08-27 NOTE — Interval H&P Note (Signed)
History and Physical Interval Note:  08/27/2013 7:12 AM  Maria Jackson  has presented today for surgery, with the diagnosis of OA RIGHT KNEE   The various methods of treatment have been discussed with the patient and family. After consideration of risks, benefits and other options for treatment, the patient has consented to  Procedure(s): RIGHT TOTAL KNEE ARTHROPLASTY (Right) as a surgical intervention .  The patient's history has been reviewed, patient examined, no change in status, stable for surgery.  I have reviewed the patient's chart and labs.  Questions were answered to the patient's satisfaction.     Gearlean Alf

## 2013-08-27 NOTE — Anesthesia Postprocedure Evaluation (Signed)
  Anesthesia Post-op Note  Patient: Maria Jackson  Procedure(s) Performed: Procedure(s) (LRB): RIGHT TOTAL KNEE ARTHROPLASTY (Right)  Patient Location: PACU  Anesthesia Type: Spinal  Level of Consciousness: awake and alert   Airway and Oxygen Therapy: Patient Spontanous Breathing  Post-op Pain: mild  Post-op Assessment: Post-op Vital signs reviewed, Patient's Cardiovascular Status Stable, Respiratory Function Stable, Patent Airway and No signs of Nausea or vomiting  Last Vitals:  Filed Vitals:   08/27/13 0939  BP: 116/51  Pulse: 68  Temp: 36 C  Resp: 15    Post-op Vital Signs: stable   Complications: No apparent anesthesia complications

## 2013-08-27 NOTE — Anesthesia Preprocedure Evaluation (Signed)
Anesthesia Evaluation  Patient identified by MRN, date of birth, ID band Patient awake    Reviewed: Allergy & Precautions, H&P , NPO status , Patient's Chart, lab work & pertinent test results  Airway Mallampati: II TM Distance: >3 FB Neck ROM: Full    Dental no notable dental hx.    Pulmonary neg pulmonary ROS,  breath sounds clear to auscultation  Pulmonary exam normal       Cardiovascular negative cardio ROS  Rhythm:Regular Rate:Normal     Neuro/Psych negative neurological ROS  negative psych ROS   GI/Hepatic negative GI ROS, Neg liver ROS,   Endo/Other  negative endocrine ROS  Renal/GU negative Renal ROS  negative genitourinary   Musculoskeletal negative musculoskeletal ROS (+)   Abdominal   Peds negative pediatric ROS (+)  Hematology negative hematology ROS (+)   Anesthesia Other Findings   Reproductive/Obstetrics negative OB ROS                           Anesthesia Physical Anesthesia Plan  ASA: II  Anesthesia Plan: Spinal   Post-op Pain Management:    Induction: Intravenous  Airway Management Planned: Simple Face Mask  Additional Equipment:   Intra-op Plan:   Post-operative Plan:   Informed Consent: I have reviewed the patients History and Physical, chart, labs and discussed the procedure including the risks, benefits and alternatives for the proposed anesthesia with the patient or authorized representative who has indicated his/her understanding and acceptance.     Plan Discussed with: CRNA and Surgeon  Anesthesia Plan Comments:         Anesthesia Quick Evaluation

## 2013-08-28 DIAGNOSIS — D649 Anemia, unspecified: Secondary | ICD-10-CM | POA: Insufficient documentation

## 2013-08-28 DIAGNOSIS — D62 Acute posthemorrhagic anemia: Secondary | ICD-10-CM | POA: Diagnosis not present

## 2013-08-28 LAB — CBC
HCT: 30.9 % — ABNORMAL LOW (ref 36.0–46.0)
HEMOGLOBIN: 10.3 g/dL — AB (ref 12.0–15.0)
MCH: 33.1 pg (ref 26.0–34.0)
MCHC: 33.3 g/dL (ref 30.0–36.0)
MCV: 99.4 fL (ref 78.0–100.0)
Platelets: 275 10*3/uL (ref 150–400)
RBC: 3.11 MIL/uL — ABNORMAL LOW (ref 3.87–5.11)
RDW: 12.7 % (ref 11.5–15.5)
WBC: 10.1 10*3/uL (ref 4.0–10.5)

## 2013-08-28 LAB — BASIC METABOLIC PANEL
BUN: 11 mg/dL (ref 6–23)
CO2: 26 mEq/L (ref 19–32)
Calcium: 8.5 mg/dL (ref 8.4–10.5)
Chloride: 99 mEq/L (ref 96–112)
Creatinine, Ser: 0.64 mg/dL (ref 0.50–1.10)
GFR calc non Af Amer: 88 mL/min — ABNORMAL LOW (ref >90)
GLUCOSE: 109 mg/dL — AB (ref 70–99)
POTASSIUM: 3.9 meq/L (ref 3.7–5.3)
Sodium: 135 mEq/L — ABNORMAL LOW (ref 137–147)

## 2013-08-28 NOTE — Progress Notes (Signed)
   CARE MANAGEMENT NOTE 08/28/2013  Patient:  EVANGELIA, WHITAKER   Account Number:  [id number]  Date Initiated:  08/28/2013  Documentation initiated by:  [facility]  Subjective/Objective Assessment:   RIGHT TOTAL KNEE ARTHROPLASTY     Action/Plan:   [place]  husband Derian Pfost # [phone]   Anticipated DC Date:  08/29/2013   Anticipated DC Plan:  [place]  CM consult      [facility] Choice  HOME HEALTH   Choice offered to / List presented to:  C-1 Patient        [place] [facility] arranged  [place] [facility]      [place].   Status of service:  Completed, signed off Medicare Important Message given?   (If response is "NO", the following Medicare IM given date fields will be blank) Date Medicare IM given:   Date Additional Medicare IM given:    Discharge Disposition:  [place]  Per UR Regulation:    If discussed at Long Length of Stay Meetings, dates discussed:    Comments:  08/28/2013 1200 NCM spoke to pt and offered choice for [facility]. Pt requested [facility] for HH. States she has RW and 3n1 at home. Jonnie Finner RN CCM Case Mgmt phone [phone]

## 2013-08-28 NOTE — Progress Notes (Signed)
OT Cancellation Note  Patient Details Name: Maria Jackson MRN: [id number] DOB: May 31, 1946   Cancelled Treatment:    Noted per PT- pt stated she does not need OT as she recently had her other knee done. Betsy Pries 08/28/2013, 12:44 PM

## 2013-08-28 NOTE — Progress Notes (Signed)
   Subjective: 1 Day Post-Op Procedure(s) (LRB): RIGHT TOTAL KNEE ARTHROPLASTY (Right) Patient reports pain as mild.   Patient seen in rounds with Dr. Wynelle Link. Doing great after surgery.  She walked 70 feet the day of surgery. Patient is well, but has had some minor complaints of pain in the knee, requiring pain medications We will start therapy today.  Plan is to go Home after hospital stay.  Objective: Vital signs in last 24 hours: Temp:  [96.7 F (35.9 C)-97.6 F (36.4 C)] 97.3 F (36.3 C) (02/24 0629) Pulse Rate:  [58-76] 76 (02/24 0629) Resp:  [15-18] 16 (02/24 0629) BP: (115-151)/(51-74) 136/68 mmHg (02/24 0629) SpO2:  [99 %-100 %] 100 % (02/24 0629) Weight:  [64.411 kg (142 lb)] 64.411 kg (142 lb) (02/23 1800)  Intake/Output from previous day:  Intake/Output Summary (Last 24 hours) at 08/28/13 0833 Last data filed at 08/28/13 0830  Gross per 24 hour  Intake 4602.5 ml  Output   4825 ml  Net -222.5 ml    Intake/Output this shift: Total I/O In: 60 [P.O.:60] Out: -   Labs:  Recent Labs  08/28/13 0430  HGB 10.3*    Recent Labs  08/28/13 0430  WBC 10.1  RBC 3.11*  HCT 30.9*  PLT 275    Recent Labs  08/28/13 0430  NA 135*  K 3.9  CL 99  CO2 26  BUN 11  CREATININE 0.64  GLUCOSE 109*  CALCIUM 8.5   No results found for this basename: LABPT, INR,  in the last 72 hours  EXAM General - Patient is Alert, Appropriate and Oriented Extremity - Sensation intact distally Dorsiflexion/Plantar flexion intact Dressing - dressing C/D/I and no drainage Motor Function - intact, moving foot and toes well on exam.  Hemovac pulled without difficulty.  Past Medical History  Diagnosis Date  . Allergy   . Arthritis     knees  . Osteopenia   . Raynaud disease   . Insomnia     NO LONGER AN ISSUE  . Spondylolisthesis     lumbar  . 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 Seasonal allergies     Assessment/Plan: 1 Day Post-Op Procedure(s)  (LRB): RIGHT TOTAL KNEE ARTHROPLASTY (Right) Active Problems:   OA (osteoarthritis) of knee  Estimated body mass index is 25.16 kg/(m^2) as calculated from the following:   Height as of this encounter: 5\' 3"  (1.6 m).   Weight as of this encounter: 64.411 kg (142 lb). Advance diet Up with therapy Plan for discharge tomorrow Discharge home with home health  DVT Prophylaxis - Xarelto Weight-Bearing as tolerated to right leg D/C O2 and Pulse OX and try on Room [address]  Mickel Crow 08/28/2013, 8:33 AM

## 2013-08-28 NOTE — Progress Notes (Signed)
08/28/13 1400  PT Visit Information  Last PT Received On 08/28/13  Assistance Needed +1  History of Present Illness s/p R TKA  PT Time Calculation  PT Start Time 1320  PT Stop Time 1343  PT Time Calculation (min) 23 min  Precautions  Precautions Knee  Required Braces or Orthoses Knee Immobilizer - Right  Knee Immobilizer - Right Discontinue once straight leg raise with < 10 degree lag  Restrictions  Other Position/Activity Restrictions WBAT  Cognition  Arousal/Alertness Awake/alert  Behavior During Therapy WFL for tasks assessed/performed  Overall Cognitive Status Within Functional Limits for tasks assessed  Bed Mobility  Overal bed mobility Needs Assistance  Bed Mobility Sit to Supine  Sit to supine Min assist  General bed mobility comments min with RLE, cues for technique  Transfers  Overall transfer level Needs assistance  Equipment used Rolling walker (2 wheeled)  Transfers Sit to/from Stand  Sit to Stand Min guard  General transfer comment cues for hand placement  Ambulation/Gait  Ambulation/Gait assistance Min guard  Ambulation Distance (Feet) 140 Feet (15'more)  Assistive device Rolling walker (2 wheeled)  Gait Pattern/deviations Step-to pattern;Antalgic  General Gait Details cues for RW safety  Total Joint Exercises  Heel Slides AAROM;Both;15 reps;AROM  Goniometric ROM 84* (supine)  PT - End of Session  Equipment Utilized During Treatment Gait belt  Activity Tolerance Patient tolerated treatment well  Patient left in bed;with call bell/phone within reach  Nurse Communication Mobility status  PT - Assessment/Plan  PT Plan Current plan remains appropriate  PT Frequency 7X/week  Follow Up Recommendations Home health PT  PT equipment None recommended by PT  PT Goal Progression  Progress towards PT goals Progressing toward goals  Acute Rehab PT Goals  PT Goal Formulation With patient  Time For Goal Achievement 09/03/13  Potential to Achieve Goals Good

## 2013-08-28 NOTE — Progress Notes (Signed)
Physical Therapy Treatment Patient Details Name: Maria Jackson MRN: [id number] DOB: 03/30/1946 Today's Date: 08/28/2013 Time: 6440-3474 PT Time Calculation (min): 32 min  PT Assessment / Plan / Recommendation  History of Present Illness s/p R TKA   PT Comments   Progressing well, a little more sore today, ice to right knee  Follow Up Recommendations  Home health PT     Does the patient have the potential to tolerate intense rehabilitation     Barriers to Discharge        Equipment Recommendations  None recommended by PT    Recommendations for Other Services    Frequency 7X/week   Progress towards PT Goals Progress towards PT goals: Progressing toward goals  Plan Current plan remains appropriate    Precautions / Restrictions Precautions Precautions: Knee Required Braces or Orthoses: Knee Immobilizer - Right Knee Immobilizer - Right: Discontinue once straight leg raise with < 10 degree lag Restrictions Other Position/Activity Restrictions: WBAT   Pertinent Vitals/Pain rn gave pain meds after PT    Mobility  Transfers Overall transfer level: Needs assistance Equipment used: Rolling walker (2 wheeled) Transfers: Sit to/from Stand Sit to Stand: Min guard General transfer comment: cues for hand placement Ambulation/Gait Ambulation/Gait assistance: Min guard;Supervision Ambulation Distance (Feet): 150 Feet Assistive device: Rolling walker (2 wheeled) Gait Pattern/deviations: Step-to pattern;Antalgic General Gait Details: cues for RW safety    Exercises Total Joint Exercises Ankle Circles/Pumps: AROM;Both;10 reps Quad Sets: AROM;Both;15 reps Heel Slides: AAROM;Both;15 reps;AROM Hip ABduction/ADduction: AROM;AAROM;15 reps;Right Straight Leg Raises: AAROM;Right;Strengthening;15 reps Knee Flexion: AROM;Both;5 reps Goniometric ROM: R 86*  L 127*( pt wanted to measure recent TKA)   PT Diagnosis:    PT Problem List:   PT Treatment Interventions:     PT Goals  (current goals can now be found in the care plan section) Acute Rehab PT Goals PT Goal Formulation: With patient Time For Goal Achievement: 09/03/13 Potential to Achieve Goals: Good  Visit Information  Last PT Received On: 08/28/13 Assistance Needed: +1 History of Present Illness: s/p R TKA    Subjective Data      Cognition  Cognition Arousal/Alertness: Awake/alert Behavior During Therapy: WFL for tasks assessed/performed Overall Cognitive Status: Within Functional Limits for tasks assessed    Balance     End of Session PT - End of Session Equipment Utilized During Treatment: Gait belt Activity Tolerance: Patient tolerated treatment well Patient left: in chair;with call bell/phone within reach;with family/visitor present Nurse Communication: Mobility status;Other (comment) (N and V)   GP     [facility] 08/28/2013, 12:39 PM

## 2013-08-28 NOTE — Discharge Instructions (Addendum)
Information on my medicine - XARELTO (Rivaroxaban)  This medication education was reviewed with me or my healthcare representative as part of my discharge preparation.  The pharmacist that spoke with me during my hospital stay was:  Absher, Julieta Bellini, RPH  Why was Xarelto prescribed for you? Xarelto was prescribed for you to reduce the risk of blood clots forming after orthopedic surgery. The medical term for these abnormal blood clots is venous thromboembolism (VTE).  What do you need to know about xarelto ? Take your Xarelto ONCE DAILY at the same time every day. You may take it either with or without food.  If you have difficulty swallowing the tablet whole, you may crush it and mix in applesauce just prior to taking your dose.  Take Xarelto exactly as prescribed by your doctor and DO NOT stop taking Xarelto without talking to the doctor who prescribed the medication.  Stopping without other VTE prevention medication to take the place of Xarelto may increase your risk of developing a clot.  After discharge, you should have regular check-up appointments with your healthcare provider that is prescribing your Xarelto.    What do you do if you miss a dose? If you miss a dose, take it as soon as you remember on the same day then continue your regularly scheduled once daily regimen the next day. Do not take two doses of Xarelto on the same day.   Important Safety Information A possible side effect of Xarelto is bleeding. You should call your healthcare provider right away if you experience any of the following:   Bleeding from an injury or your nose that does not stop.   Unusual colored urine (red or dark brown) or unusual colored stools (red or black).   Unusual bruising for unknown reasons.   A serious fall or if you hit your head (even if there is no bleeding).  Some medicines may interact with Xarelto and might increase your risk of bleeding while on Xarelto. To help avoid  this, consult your healthcare provider or pharmacist prior to using any new prescription or non-prescription medications, including herbals, vitamins, non-steroidal anti-inflammatory drugs (NSAIDs) and supplements.  This website has more information on Xarelto: [email or web address]    Dr. Gaynelle Arabian Total Joint Specialist [facility] [address], [place], [place] [address] [phone]  TOTAL KNEE REPLACEMENT POSTOPERATIVE DIRECTIONS    Knee Rehabilitation, Guidelines Following Surgery  Results after knee surgery are often greatly improved when you follow the exercise, range of motion and muscle strengthening exercises prescribed by your doctor. Safety measures are also important to protect the knee from further injury. Any time any of these exercises cause you to have increased pain or swelling in your knee joint, decrease the amount until you are comfortable again and slowly increase them. If you have problems or questions, call your caregiver or physical therapist for advice.   HOME CARE INSTRUCTIONS  Remove items at home which could result in a fall. This includes throw rugs or furniture in walking pathways.  Continue medications as instructed at time of discharge. You may have some home medications which will be placed on hold until you complete the course of blood thinner medication.  You may start showering once you are discharged home but do not submerge the incision under water. Just pat the incision dry and apply a dry gauze dressing on daily. Walk with walker as instructed.  You may resume a sexual relationship in one month or when given the  OK by  your doctor.   Use walker as long as suggested by your caregivers.  Avoid periods of inactivity such as sitting longer than an hour when not asleep. This helps prevent blood clots.  You may put full weight on your legs and walk as much as is comfortable.  You may return to work once you are cleared by  your doctor.  Do not drive a car for 6 weeks or until released by you surgeon.   Do not drive while taking narcotics.  Wear the elastic stockings for three weeks following surgery during the day but you may remove then at night. Make sure you keep all of your appointments after your operation with all of your doctors and caregivers. You should call the office at the above phone number and make an appointment for approximately two weeks after the date of your surgery. Change the dressing daily and reapply a dry dressing each time. Please pick up a stool softener and laxative for home use as long as you are requiring pain medications.  Continue to use ice on the knee for pain and swelling from surgery. You may notice swelling that will progress down to the foot and ankle.  This is normal after surgery.  Elevate the leg when you are not up walking on it.   It is important for you to complete the blood thinner medication as prescribed by your doctor.  Continue to use the breathing machine which will help keep your temperature down.  It is common for your temperature to cycle up and down following surgery, especially at night when you are not up moving around and exerting yourself.  The breathing machine keeps your lungs expanded and your temperature down.  RANGE OF MOTION AND STRENGTHENING EXERCISES  Rehabilitation of the knee is important following a knee injury or an operation. After just a few days of immobilization, the muscles of the thigh which control the knee become weakened and shrink (atrophy). Knee exercises are designed to build up the tone and strength of the thigh muscles and to improve knee motion. Often times heat used for twenty to thirty minutes before working out will loosen up your tissues and help with improving the range of motion but do not use heat for the first two weeks following surgery. These exercises can be done on a training (exercise) mat, on the floor, on a table or on a  bed. Use what ever works the best and is most comfortable for you Knee exercises include:  Leg Lifts - While your knee is still immobilized in a splint or cast, you can do straight leg raises. Lift the leg to 60 degrees, hold for 3 sec, and slowly lower the leg. Repeat 10-20 times 2-3 times daily. Perform this exercise against resistance later as your knee gets better.  Quad and Hamstring Sets - Tighten up the muscle on the front of the thigh (Quad) and hold for 5-10 sec. Repeat this 10-20 times hourly. Hamstring sets are done by pushing the foot backward against an object and holding for 5-10 sec. Repeat as with quad sets.  A rehabilitation program following serious knee injuries can speed recovery and prevent re-injury in the future due to weakened muscles. Contact your doctor or a physical therapist for more information on knee rehabilitation.   SKILLED REHAB INSTRUCTIONS: If the patient is transferred to a skilled rehab facility following release from the hospital, a list of the current medications will be sent to the facility  for the patient to continue.  When discharged from the skilled rehab facility, please have the facility set up the patient's [place] prior to being released. Also, the skilled facility will be responsible for providing the patient with their medications at time of release from the facility to include their pain medication, the muscle relaxants, and their blood thinner medication. If the patient is still at the rehab facility at time of the two week follow up appointment, the skilled rehab facility will also need to assist the patient in arranging follow up appointment in our office and any transportation needs.  MAKE SURE YOU:  Understand these instructions.  Will watch your condition.  Will get help right away if you are not doing well or get worse.    Pick up stool softner and laxative for home. Do not submerge incision under water. May  shower. Continue to use ice for pain and swelling from surgery.

## 2013-08-29 LAB — CBC
HCT: 34.4 % — ABNORMAL LOW (ref 36.0–46.0)
HEMOGLOBIN: 11.8 g/dL — AB (ref 12.0–15.0)
MCH: 34 pg (ref 26.0–34.0)
MCHC: 34.3 g/dL (ref 30.0–36.0)
MCV: 99.1 fL (ref 78.0–100.0)
Platelets: 293 10*3/uL (ref 150–400)
RBC: 3.47 MIL/uL — AB (ref 3.87–5.11)
RDW: 12.9 % (ref 11.5–15.5)
WBC: 9 10*3/uL (ref 4.0–10.5)

## 2013-08-29 LAB — BASIC METABOLIC PANEL
BUN: 8 mg/dL (ref 6–23)
CALCIUM: 8.9 mg/dL (ref 8.4–10.5)
CO2: 27 meq/L (ref 19–32)
CREATININE: 0.67 mg/dL (ref 0.50–1.10)
Chloride: 92 mEq/L — ABNORMAL LOW (ref 96–112)
GFR calc Af Amer: >90 mL/min (ref >90)
GFR calc non Af Amer: 87 mL/min — ABNORMAL LOW (ref >90)
GLUCOSE: 116 mg/dL — AB (ref 70–99)
Potassium: 4.1 mEq/L (ref 3.7–5.3)
Sodium: 131 mEq/L — ABNORMAL LOW (ref 137–147)

## 2013-08-29 MED ORDER — METHOCARBAMOL 500 MG PO TABS: 500.0000 mg | ORAL_TABLET | Freq: Four times a day (QID) | ORAL | Status: DC | PRN

## 2013-08-29 MED ORDER — OXYCODONE HCL 5 MG PO TABS: 5.0000 mg | ORAL_TABLET | ORAL | Status: DC | PRN

## 2013-08-29 MED ORDER — ONDANSETRON HCL 4 MG PO TABS: 4.0000 mg | ORAL_TABLET | Freq: Four times a day (QID) | ORAL | Status: DC | PRN

## 2013-08-29 MED ORDER — RIVAROXABAN 10 MG PO TABS: 10.0000 mg | ORAL_TABLET | Freq: Every day | ORAL | Status: DC

## 2013-08-29 MED ORDER — TRAMADOL HCL 50 MG PO TABS: 50.0000 mg | ORAL_TABLET | Freq: Four times a day (QID) | ORAL | Status: DC | PRN

## 2013-08-29 NOTE — Discharge Summary (Signed)
Physician Discharge Summary   Patient ID: Maria Jackson MRN: [id number] DOB/AGE: 01/23/1946 68 y.o.  Admit date: 08/27/2013 Discharge date: 08-29-2013  Primary Diagnosis:  Osteoarthritis Right knee(s)  Admission Diagnoses:  Past Medical History  Diagnosis Date  . Allergy   . Arthritis     knees  . Osteopenia   . Raynaud disease   . Insomnia     NO LONGER AN ISSUE  . Spondylolisthesis     lumbar  . 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 Seasonal allergies    Discharge Diagnoses:   Active Problems:   OA (osteoarthritis) of knee   Postoperative anemia due to acute blood loss  Estimated body mass index is 25.16 kg/(m^2) as calculated from the following:   Height as of this encounter: 5' 3" (1.6 m).   Weight as of this encounter: 64.411 kg (142 lb).  Procedure:  Procedure(s) (LRB): RIGHT TOTAL KNEE ARTHROPLASTY (Right)   Consults: None  HPI: SAREE KROGH is a 68 y.o. year old female with end stage OA of her right knee with progressively worsening pain and dysfunction. She has constant pain, with activity and at rest and significant functional deficits with difficulties even with ADLs. She has had extensive non-op management including analgesics, injections of cortisone and viscosupplements, and home exercise program, but remains in significant pain with significant dysfunction.Radiographs show bone on bone arthritis medial and patellofemoral. She presents now for right Total Knee Arthroplasty.   Laboratory Data: Admission on 08/27/2013  Component Date Value Ref Range Status  . WBC 08/28/2013 10.1  4.0 - 10.5 K/uL Final  . RBC 08/28/2013 3.11* 3.87 - 5.11 MIL/uL Final  . Hemoglobin 08/28/2013 10.3* 12.0 - 15.0 g/dL Final  . HCT 08/28/2013 30.9* 36.0 - 46.0 % Final  . MCV 08/28/2013 99.4  78.0 - 100.0 fL Final  . MCH 08/28/2013 33.1  26.0 - 34.0 pg Final  . MCHC 08/28/2013 33.3  30.0 - 36.0 g/dL Final  . RDW 08/28/2013 12.7  11.5 - 15.5 % Final  .  Platelets 08/28/2013 275  150 - 400 K/uL Final  . Sodium 08/28/2013 135* 137 - 147 mEq/L Final  . Potassium 08/28/2013 3.9  3.7 - 5.3 mEq/L Final  . Chloride 08/28/2013 99  96 - 112 mEq/L Final  . CO2 08/28/2013 26  19 - 32 mEq/L Final  . Glucose, Bld 08/28/2013 109* 70 - 99 mg/dL Final  . BUN 08/28/2013 11  6 - 23 mg/dL Final  . Creatinine, Ser 08/28/2013 0.64  0.50 - 1.10 mg/dL Final  . Calcium 08/28/2013 8.5  8.4 - 10.5 mg/dL Final  . GFR calc non Af Amer 08/28/2013 88* >90 mL/min Final  . GFR calc Af Amer 08/28/2013 >90  >90 mL/min Final   Comment: (NOTE)                          The eGFR has been calculated using the CKD EPI equation.                          This calculation has not been validated in all clinical situations.                          eGFR's persistently <90 mL/min signify possible Chronic Kidney  Disease.  . WBC 08/29/2013 9.0  4.0 - 10.5 K/uL Final  . RBC 08/29/2013 3.47* 3.87 - 5.11 MIL/uL Final  . Hemoglobin 08/29/2013 11.8* 12.0 - 15.0 g/dL Final  . HCT 08/29/2013 34.4* 36.0 - 46.0 % Final  . MCV 08/29/2013 99.1  78.0 - 100.0 fL Final  . MCH 08/29/2013 34.0  26.0 - 34.0 pg Final  . MCHC 08/29/2013 34.3  30.0 - 36.0 g/dL Final  . RDW 08/29/2013 12.9  11.5 - 15.5 % Final  . Platelets 08/29/2013 293  150 - 400 K/uL Final  . Sodium 08/29/2013 131* 137 - 147 mEq/L Final  . Potassium 08/29/2013 4.1  3.7 - 5.3 mEq/L Final  . Chloride 08/29/2013 92* 96 - 112 mEq/L Final  . CO2 08/29/2013 27  19 - 32 mEq/L Final  . Glucose, Bld 08/29/2013 116* 70 - 99 mg/dL Final  . BUN 08/29/2013 8  6 - 23 mg/dL Final  . Creatinine, Ser 08/29/2013 0.67  0.50 - 1.10 mg/dL Final  . Calcium 08/29/2013 8.9  8.4 - 10.5 mg/dL Final  . GFR calc non Af Amer 08/29/2013 87* >90 mL/min Final  . GFR calc Af Amer 08/29/2013 >90  >90 mL/min Final   Comment: (NOTE)                          The eGFR has been calculated using the CKD EPI equation.                           This calculation has not been validated in all clinical situations.                          eGFR's persistently <90 mL/min signify possible Chronic Kidney                          Disease.  Hospital Outpatient Visit on 08/22/2013  Component Date Value Ref Range Status  . aPTT 08/22/2013 25  24 - 37 seconds Final  . WBC 08/22/2013 5.4  4.0 - 10.5 K/uL Final  . RBC 08/22/2013 3.72* 3.87 - 5.11 MIL/uL Final  . Hemoglobin 08/22/2013 12.6  12.0 - 15.0 g/dL Final  . HCT 08/22/2013 37.0  36.0 - 46.0 % Final  . MCV 08/22/2013 99.5  78.0 - 100.0 fL Final  . MCH 08/22/2013 33.9  26.0 - 34.0 pg Final  . MCHC 08/22/2013 34.1  30.0 - 36.0 g/dL Final  . RDW 08/22/2013 12.7  11.5 - 15.5 % Final  . Platelets 08/22/2013 356  150 - 400 K/uL Final  . Sodium 08/22/2013 135* 137 - 147 mEq/L Final  . Potassium 08/22/2013 4.9  3.7 - 5.3 mEq/L Final  . Chloride 08/22/2013 98  96 - 112 mEq/L Final  . CO2 08/22/2013 29  19 - 32 mEq/L Final  . Glucose, Bld 08/22/2013 105* 70 - 99 mg/dL Final  . BUN 08/22/2013 12  6 - 23 mg/dL Final  . Creatinine, Ser 08/22/2013 0.63  0.50 - 1.10 mg/dL Final  . Calcium 08/22/2013 9.3  8.4 - 10.5 mg/dL Final  . Total Protein 08/22/2013 6.8  6.0 - 8.3 g/dL Final  . Albumin 08/22/2013 3.6  3.5 - 5.2 g/dL Final  . AST 08/22/2013 20  0 - 37 U/L Final  . ALT 08/22/2013 14  0 - 35 U/L Final  . Alkaline  Phosphatase 08/22/2013 73  39 - 117 U/L Final  . Total Bilirubin 08/22/2013 0.5  0.3 - 1.2 mg/dL Final  . GFR calc non Af Amer 08/22/2013 89* >90 mL/min Final  . GFR calc Af Amer 08/22/2013 >90  >90 mL/min Final   Comment: (NOTE)                          The eGFR has been calculated using the CKD EPI equation.                          This calculation has not been validated in all clinical situations.                          eGFR's persistently <90 mL/min signify possible Chronic Kidney                          Disease.  Marland Kitchen Prothrombin Time 08/22/2013 13.1  11.6 - 15.2 seconds Final   . INR 08/22/2013 1.01  0.00 - 1.49 Final  . ABO/RH(D) 08/22/2013 A POS   Final  . Antibody Screen 08/22/2013 NEG   Final  . Sample Expiration 08/22/2013 08/30/2013   Final  . Color, Urine 08/22/2013 YELLOW  YELLOW Final  . APPearance 08/22/2013 CLEAR  CLEAR Final  . Specific Gravity, Urine 08/22/2013 1.004* 1.005 - 1.030 Final  . pH 08/22/2013 7.0  5.0 - 8.0 Final  . Glucose, UA 08/22/2013 NEGATIVE  NEGATIVE mg/dL Final  . Hgb urine dipstick 08/22/2013 TRACE* NEGATIVE Final  . Bilirubin Urine 08/22/2013 NEGATIVE  NEGATIVE Final  . Ketones, ur 08/22/2013 NEGATIVE  NEGATIVE mg/dL Final  . Protein, ur 08/22/2013 NEGATIVE  NEGATIVE mg/dL Final  . Urobilinogen, UA 08/22/2013 0.2  0.0 - 1.0 mg/dL Final  . Nitrite 08/22/2013 NEGATIVE  NEGATIVE Final  . Leukocytes, UA 08/22/2013 NEGATIVE  NEGATIVE Final  . MRSA, PCR 08/22/2013 NEGATIVE  NEGATIVE Final  . Staphylococcus aureus 08/22/2013 POSITIVE* NEGATIVE Final   Comment:                                 The Xpert SA Assay (FDA                          approved for NASAL specimens                          in patients over 59 years of age),                          is one component of                          a comprehensive surveillance                          program.  Test performance has                          been validated by [employer] for patients greater                          than or equal to 58 year old.                          It is not intended                          to diagnose infection nor to                          guide or monitor treatment.  . RBC / HPF 08/22/2013 0-2  <3 RBC/hpf Final     X-Rays:No results found.  EKG:No orders found for this or any previous visit.   Hospital Course: BETHENNY LOSEE is a 68 y.o. who was admitted to [facility]. They were brought to the operating room on 08/27/2013 and underwent Procedure(s): RIGHT TOTAL KNEE ARTHROPLASTY.  Patient  tolerated the procedure well and was later transferred to the recovery room and then to the orthopaedic floor for postoperative care.  They were given PO and IV analgesics for pain control following their surgery.  They were given 24 hours of postoperative antibiotics of  Anti-infectives   Start     Dose/Rate Route Frequency Ordered Stop   08/27/13 2000  vancomycin (VANCOCIN) IVPB 1000 mg/200 mL premix     1,000 mg 200 mL/hr over 60 Minutes Intravenous Every 12 hours 08/27/13 1134 08/27/13 2100   08/27/13 0645  vancomycin (VANCOCIN) 1,000 mg in sodium chloride 0.9 % 250 mL IVPB     1,000 mg 250 mL/hr over 60 Minutes Intravenous On call to O.R. 08/27/13 0835 08/27/13 0905   08/27/13 0637  vancomycin (VANCOCIN) IVPB 1000 mg/200 mL premix  Status:  Discontinued     1,000 mg 200 mL/hr over 60 Minutes Intravenous On call to O.R. 08/27/13 7758 08/27/13 4539     and started on DVT prophylaxis in the form of Xarelto.   PT and OT were ordered for total joint protocol.  Discharge planning consulted to help with postop disposition and equipment needs.  Patient had a great night on the evening of surgery and walked about 70 feet.  They started to get up OOB with therapy on day one. Hemovac drain was pulled without difficulty.  Continued to work with therapy into day two.  Dressing was changed on day two and the incision was healing well.  Patient was seen in rounds on day 2 and was ready to go home.   Discharge Medications: Prior to Admission medications   Medication Sig Start Date End Date Taking? Authorizing Provider  acetaminophen (TYLENOL) 500 MG tablet Take 1,000 mg by mouth every 6 (six) hours as needed for moderate pain or headache.   Yes Historical Provider, MD  cetirizine (ZYRTEC) 10 MG tablet Take 10 mg by mouth at bedtime.    Yes Historical Provider, MD  docusate sodium (COLACE) 100 MG capsule Take 100 mg by mouth at bedtime.   Yes Historical Provider, MD  fluticasone (FLONASE) 50 MCG/ACT nasal  spray Place 1 spray into the nose at bedtime.  02/13/11  Yes Historical Provider, MD  Hypromellose (GENTEAL) 0.3 % SOLN Apply 1 drop to eye at bedtime.   Yes Historical Provider, MD  Polyethylene Glycol 400 (BLINK TEARS OP) Place 1 drop into  both eyes daily.   Yes Historical Provider, MD  sodium chloride (OCEAN) 0.65 % nasal spray Place 1 spray into the nose 2 (two) times daily as needed for congestion.    Yes Historical Provider, MD  Magnesium 250 MG TABS Take 250 mg by mouth at bedtime.    Historical Provider, MD  methocarbamol (ROBAXIN) 500 MG tablet Take 1 tablet (500 mg total) by mouth every 6 (six) hours as needed for muscle spasms. 08/29/13   Alexzandrew Perkins, PA-C  ondansetron (ZOFRAN) 4 MG tablet Take 1 tablet (4 mg total) by mouth every 6 (six) hours as needed for nausea. 08/29/13   Alexzandrew Perkins, PA-C  oxyCODONE (OXY IR/ROXICODONE) 5 MG immediate release tablet Take 1-2 tablets (5-10 mg total) by mouth every 3 (three) hours as needed for moderate pain, severe pain or breakthrough pain. 08/29/13   Alexzandrew Dara Lords, PA-C  pseudoephedrine (SUDAFED) 120 MG 12 hr tablet Take 120 mg by mouth every 12 (twelve) hours as needed for congestion.    Historical Provider, MD  rivaroxaban (XARELTO) 10 MG TABS tablet Take 1 tablet (10 mg total) by mouth daily with breakfast. Take Xarelto for two and a half more weeks, then discontinue Xarelto. Once the patient has completed the Xarelto, they may resume the 81 mg Aspirin. 08/29/13   Alexzandrew Dara Lords, PA-C  traMADol (ULTRAM) 50 MG tablet Take 1-2 tablets (50-100 mg total) by mouth every 6 (six) hours as needed (mild to moderate pain). 08/29/13   Alexzandrew Dara Lords, PA-C   Discharge home with home health  Diet - Regular diet  Follow up - in 2 weeks  Activity - WBAT  Disposition - Home  Condition Upon Discharge - Good  D/C Meds - See DC Summary  DVT Prophylaxis - Xarelto       Discharge Orders   Future Orders Complete By Expires   Call MD  / Call 911  As directed    Comments:     If you experience chest pain or shortness of breath, CALL 911 and be transported to the hospital emergency room.  If you develope a fever above 101 F, pus (white drainage) or increased drainage or redness at the wound, or calf pain, call your surgeon's office.   Change dressing  As directed    Comments:     Change dressing daily with sterile 4 x 4 inch gauze dressing and apply TED hose. Do not submerge the incision under water.   Constipation Prevention  As directed    Comments:     Drink plenty of fluids.  Prune juice may be helpful.  You may use a stool softener, such as Colace (over the counter) 100 mg twice a day.  Use MiraLax (over the counter) for constipation as needed.   Diet general  As directed    Discharge instructions  As directed    Comments:     Pick up stool softner and laxative for home. Do not submerge incision under water. May shower. Continue to use ice for pain and swelling from surgery.  Take Xarelto for two and a half more weeks, then discontinue Xarelto. Once the patient has completed the Xarelto, they may resume the 81 mg Aspirin.   Do not put a pillow under the knee. Place it under the heel.  As directed    Do not sit on low chairs, stoools or toilet seats, as it may be difficult to get up from low surfaces  As directed    Driving restrictions  As  directed    Comments:     No driving until released by the physician.   Increase activity slowly as tolerated  As directed    Lifting restrictions  As directed    Comments:     No lifting until released by the physician.   Patient may shower  As directed    Comments:     You may shower without a dressing once there is no drainage.  Do not wash over the wound.  If drainage remains, do not shower until drainage stops.   TED hose  As directed    Comments:     Use stockings (TED hose) for 3 weeks on both leg(s).  You may remove them at night for sleeping.   Weight bearing as  tolerated  As directed    Questions:     Laterality:     Extremity:         Medication List    STOP taking these medications       aspirin EC 81 MG tablet     cholecalciferol 1000 UNITS tablet  Commonly known as:  VITAMIN D     multivitamin with minerals Tabs tablet     Omega-3 Fish Oil 1200 MG Caps      TAKE these medications       acetaminophen 500 MG tablet  Commonly known as:  TYLENOL  Take 1,000 mg by mouth every 6 (six) hours as needed for moderate pain or headache.     BLINK TEARS OP  Place 1 drop into both eyes daily.     cetirizine 10 MG tablet  Commonly known as:  ZYRTEC  Take 10 mg by mouth at bedtime.     docusate sodium 100 MG capsule  Commonly known as:  COLACE  Take 100 mg by mouth at bedtime.     fluticasone 50 MCG/ACT nasal spray  Commonly known as:  FLONASE  Place 1 spray into the nose at bedtime.     GENTEAL 0.3 % Soln  Generic drug:  Hypromellose  Apply 1 drop to eye at bedtime.     Magnesium 250 MG Tabs  Take 250 mg by mouth at bedtime.     methocarbamol 500 MG tablet  Commonly known as:  ROBAXIN  Take 1 tablet (500 mg total) by mouth every 6 (six) hours as needed for muscle spasms.     ondansetron 4 MG tablet  Commonly known as:  ZOFRAN  Take 1 tablet (4 mg total) by mouth every 6 (six) hours as needed for nausea.     oxyCODONE 5 MG immediate release tablet  Commonly known as:  Oxy IR/ROXICODONE  Take 1-2 tablets (5-10 mg total) by mouth every 3 (three) hours as needed for moderate pain, severe pain or breakthrough pain.     pseudoephedrine 120 MG 12 hr tablet  Commonly known as:  SUDAFED  Take 120 mg by mouth every 12 (twelve) hours as needed for congestion.     rivaroxaban 10 MG Tabs tablet  Commonly known as:  XARELTO  - Take 1 tablet (10 mg total) by mouth daily with breakfast. Take Xarelto for two and a half more weeks, then discontinue Xarelto.  - Once the patient has completed the Xarelto, they may resume the 81 mg  Aspirin.     sodium chloride 0.65 % nasal spray  Commonly known as:  OCEAN  Place 1 spray into the nose 2 (two) times daily as needed for congestion.  traMADol 50 MG tablet  Commonly known as:  ULTRAM  Take 1-2 tablets (50-100 mg total) by mouth every 6 (six) hours as needed (mild to moderate pain).       Follow-up Information   Follow up with [facility])    Contact information:   [place] [address] [phone]       Follow up with Gearlean Alf, MD. Schedule an appointment as soon as possible for a visit on 09/11/2013. (for check up. Call [phone] tomorrow to make the appointment)    Specialty:  Orthopedic Surgery   Contact information:   [address] [place] [address] [phone]       Signed: Mickel Crow 08/29/2013, 9:55 AM

## 2013-08-29 NOTE — Progress Notes (Signed)
   Subjective: 2 Days Post-Op Procedure(s) (LRB): RIGHT TOTAL KNEE ARTHROPLASTY (Right) Patient reports pain as mild.   Patient seen in rounds by Dr. Wynelle Link. Patient is well, but has had some minor complaints of pain in the knee, requiring pain medications Patient is ready to go home  Objective: Vital signs in last 24 hours: Temp:  [97.2 F (36.2 C)-100.5 F (38.1 C)] 97.4 F (36.3 C) (02/25 0559) Pulse Rate:  [77-80] 78 (02/25 0559) Resp:  [16-18] 16 (02/25 0559) BP: (119-154)/(65-91) 119/69 mmHg (02/25 0559) SpO2:  [97 %-100 %] 97 % (02/25 0559)  Intake/Output from previous day:  Intake/Output Summary (Last 24 hours) at 08/29/13 0950 Last data filed at 08/29/13 0425  Gross per 24 hour  Intake    540 ml  Output   1825 ml  Net  -1285 ml    Intake/Output this shift:    Labs:  Recent Labs  08/28/13 0430 08/29/13 0443  HGB 10.3* 11.8*    Recent Labs  08/28/13 0430 08/29/13 0443  WBC 10.1 9.0  RBC 3.11* 3.47*  HCT 30.9* 34.4*  PLT 275 293    Recent Labs  08/28/13 0430 08/29/13 0443  NA 135* 131*  K 3.9 4.1  CL 99 92*  CO2 26 27  BUN 11 8  CREATININE 0.64 0.67  GLUCOSE 109* 116*  CALCIUM 8.5 8.9   No results found for this basename: LABPT, INR,  in the last 72 hours  EXAM: General - Patient is Alert, Appropriate and Oriented Extremity - Neurovascular intact Sensation intact distally Dorsiflexion/Plantar flexion intact Incision - clean, dry, no drainage Motor Function - intact, moving foot and toes well on exam.   Assessment/Plan: 2 Days Post-Op Procedure(s) (LRB): RIGHT TOTAL KNEE ARTHROPLASTY (Right) Procedure(s) (LRB): RIGHT TOTAL KNEE ARTHROPLASTY (Right) Past Medical History  Diagnosis Date  . Allergy   . Arthritis     knees  . Osteopenia   . Raynaud disease   . Insomnia     NO LONGER AN ISSUE  . Spondylolisthesis     lumbar  . 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 Seasonal allergies    Active Problems:  OA (osteoarthritis) of knee   Postoperative anemia due to acute blood loss  Estimated body mass index is 25.16 kg/(m^2) as calculated from the following:   Height as of this encounter: 5\' 3"  (1.6 m).   Weight as of this encounter: 64.411 kg (142 lb). Up with therapy Discharge home with home health Diet - Regular diet Follow up - in 2 weeks Activity - WBAT Disposition - Home Condition Upon Discharge - Good D/C Meds - See DC Summary DVT Prophylaxis - Xarelto  PERKINS, ALEXZANDREW 08/29/2013, 9:50 AM

## 2013-08-29 NOTE — Progress Notes (Signed)
Physical Therapy Treatment Patient Details Name: Maria Jackson MRN: [id number] DOB: June 16, 1946 Today's Date: 08/29/2013 Time: 0910-0950 PT Time Calculation (min): 40 min  PT Assessment / Plan / Recommendation  History of Present Illness s/p R TKA   PT Comments   POD # 2 am session.  Instructed pt on KI use for stairs and long distance amb.  Applied KI and assisted OOB to amb in hallway.  Practiced stairs with spouse using one R rail and one crutch.  Spouse present and instructed on safe handling tech and proper sequencing.  Returned to room then performed TKR TE's followed by ICE. Pt ready to D/C to home today.   Follow Up Recommendations  Home health PT     Does the patient have the potential to tolerate intense rehabilitation     Barriers to Discharge        Equipment Recommendations  None recommended by PT    Recommendations for Other Services    Frequency 7X/week   Progress towards PT Goals Progress towards PT goals: Progressing toward goals  Plan      Precautions / Restrictions Precautions Precautions: Knee Precaution Comments: Instructed pt and spouse on use of KI for steps/stairs Required Braces or Orthoses: Knee Immobilizer - Right Knee Immobilizer - Right: Discontinue once straight leg raise with < 10 degree lag Restrictions Weight Bearing Restrictions: No Other Position/Activity Restrictions: WBAT    Pertinent Vitals/Pain C/o 3/10 pre med ICE applied    Mobility  Bed Mobility Overal bed mobility: Needs Assistance Bed Mobility: Supine to Sit Supine to sit: Supervision;Min guard General bed mobility comments: assist for R LE and increased time Transfers Overall transfer level: Needs assistance Equipment used: Rolling walker (2 wheeled) Transfers: Sit to/from Stand Sit to Stand: Supervision General transfer comment: increased time and one VC safety with turns Ambulation/Gait Ambulation/Gait assistance: Supervision;Min guard Ambulation Distance (Feet):  220 Feet Assistive device: Rolling walker (2 wheeled) Gait Pattern/deviations: Step-to pattern;Decreased stance time - right Gait velocity: decreased General Gait Details: cues for RW safety during turns and backward gait completion Stairs: Yes Stairs assistance: Min guard Stair Management: One rail Right;Step to pattern;Forwards;With crutches Number of Stairs: 4 General stair comments: performed with spouse present to be instructed on safe handling and proper sequencing.    Exercises   Total Knee Replacement TE's 10 reps B LE ankle pumps 10 reps towel squeezes 10 reps knee presses 10 reps heel slides  10 reps SAQ's 10 reps SLR's 10 reps ABD Followed by ICE    PT Goals (current goals can now be found in the care plan section)    Visit Information  Last PT Received On: 08/29/13 Assistance Needed: +1 History of Present Illness: s/p R TKA    Subjective Data      Cognition       Balance     End of Session PT - End of Session Equipment Utilized During Treatment: Gait belt;Right knee immobilizer Activity Tolerance: Patient tolerated treatment well Patient left: in chair;with call bell/phone within reach;with family/visitor present   Rica Koyanagi  PTA [facility]  Acute  Rehab Pager      [phone]

## 2013-09-17 ENCOUNTER — Ambulatory Visit: Payer: Medicare Other | Attending: Orthopedic Surgery | Admitting: Physical Therapy

## 2013-09-17 DIAGNOSIS — R5381 Other malaise: Secondary | ICD-10-CM | POA: Insufficient documentation

## 2013-09-17 DIAGNOSIS — M25569 Pain in unspecified knee: Secondary | ICD-10-CM | POA: Insufficient documentation

## 2013-09-17 DIAGNOSIS — M25669 Stiffness of unspecified knee, not elsewhere classified: Secondary | ICD-10-CM | POA: Insufficient documentation

## 2013-09-20 ENCOUNTER — Ambulatory Visit: Payer: Medicare Other | Admitting: Physical Therapy

## 2013-09-24 ENCOUNTER — Ambulatory Visit: Payer: Medicare Other | Admitting: Physical Therapy

## 2013-09-27 ENCOUNTER — Ambulatory Visit: Payer: Medicare Other | Admitting: Physical Therapy

## 2013-10-01 ENCOUNTER — Ambulatory Visit: Payer: Medicare Other | Admitting: Physical Therapy

## 2013-10-04 ENCOUNTER — Ambulatory Visit: Payer: Medicare Other | Attending: Orthopedic Surgery | Admitting: Physical Therapy

## 2013-10-04 DIAGNOSIS — R5381 Other malaise: Secondary | ICD-10-CM | POA: Diagnosis not present

## 2013-10-04 DIAGNOSIS — M25669 Stiffness of unspecified knee, not elsewhere classified: Secondary | ICD-10-CM | POA: Insufficient documentation

## 2013-10-04 DIAGNOSIS — M25569 Pain in unspecified knee: Secondary | ICD-10-CM | POA: Insufficient documentation

## 2013-10-04 DIAGNOSIS — IMO0001 Reserved for inherently not codable concepts without codable children: Secondary | ICD-10-CM | POA: Diagnosis present

## 2013-10-08 ENCOUNTER — Ambulatory Visit: Payer: Medicare Other | Admitting: Physical Therapy

## 2013-10-08 DIAGNOSIS — M25569 Pain in unspecified knee: Secondary | ICD-10-CM | POA: Diagnosis not present

## 2013-10-11 ENCOUNTER — Ambulatory Visit: Payer: Medicare Other | Admitting: Physical Therapy

## 2013-10-11 DIAGNOSIS — M25569 Pain in unspecified knee: Secondary | ICD-10-CM | POA: Diagnosis not present

## 2014-05-06 ENCOUNTER — Encounter (HOSPITAL_COMMUNITY): Payer: Self-pay | Admitting: *Deleted

## 2014-12-30 ENCOUNTER — Other Ambulatory Visit: Payer: Self-pay

## 2015-01-14 ENCOUNTER — Encounter (HOSPITAL_COMMUNITY): Payer: Self-pay | Admitting: Emergency Medicine

## 2015-01-14 ENCOUNTER — Emergency Department (HOSPITAL_COMMUNITY): Payer: Medicare Other

## 2015-01-14 ENCOUNTER — Emergency Department (HOSPITAL_COMMUNITY)
Admission: EM | Admit: 2015-01-14 | Discharge: 2015-01-14 | Disposition: A | Discharge status: Home / Self Care | Payer: Medicare Other | Attending: Emergency Medicine | Admitting: Emergency Medicine

## 2015-01-14 DIAGNOSIS — Z880 Allergy status to penicillin: Secondary | ICD-10-CM | POA: Insufficient documentation

## 2015-01-14 DIAGNOSIS — M542 Cervicalgia: Secondary | ICD-10-CM

## 2015-01-14 DIAGNOSIS — Z8739 Personal history of other diseases of the musculoskeletal system and connective tissue: Secondary | ICD-10-CM | POA: Diagnosis not present

## 2015-01-14 DIAGNOSIS — Z8679 Personal history of other diseases of the circulatory system: Secondary | ICD-10-CM | POA: Diagnosis not present

## 2015-01-14 DIAGNOSIS — Y998 Other external cause status: Secondary | ICD-10-CM | POA: Diagnosis not present

## 2015-01-14 DIAGNOSIS — Z7951 Long term (current) use of inhaled steroids: Secondary | ICD-10-CM | POA: Insufficient documentation

## 2015-01-14 DIAGNOSIS — S199XXA Unspecified injury of neck, initial encounter: Secondary | ICD-10-CM | POA: Diagnosis present

## 2015-01-14 DIAGNOSIS — Y92410 Unspecified street and highway as the place of occurrence of the external cause: Secondary | ICD-10-CM | POA: Insufficient documentation

## 2015-01-14 DIAGNOSIS — Z79899 Other long term (current) drug therapy: Secondary | ICD-10-CM | POA: Diagnosis not present

## 2015-01-14 DIAGNOSIS — Y9389 Activity, other specified: Secondary | ICD-10-CM | POA: Diagnosis not present

## 2015-01-14 DIAGNOSIS — G8929 Other chronic pain: Secondary | ICD-10-CM | POA: Insufficient documentation

## 2015-01-14 DIAGNOSIS — Z85828 Personal history of other malignant neoplasm of skin: Secondary | ICD-10-CM | POA: Insufficient documentation

## 2015-01-14 HISTORY — DX: Other chronic pain: G89.29

## 2015-01-14 HISTORY — DX: Low back pain: M54.5

## 2015-01-14 HISTORY — DX: Low back pain, unspecified: M54.50

## 2015-01-14 MED ORDER — TRAMADOL HCL 50 MG PO TABS: 50.0000 mg | ORAL_TABLET | Freq: Four times a day (QID) | ORAL | Status: DC | PRN

## 2015-01-14 MED ORDER — METHOCARBAMOL 500 MG PO TABS: 500.0000 mg | ORAL_TABLET | Freq: Two times a day (BID) | ORAL | Status: DC | PRN

## 2015-01-14 MED ORDER — ACETAMINOPHEN 500 MG PO TABS
1000.0000 mg | ORAL_TABLET | Freq: Once | ORAL | Status: AC
  Administered 2015-01-14: 1000 mg via ORAL
  Filled 2015-01-14: qty 2

## 2015-01-14 NOTE — Discharge Instructions (Signed)
°Emergency Department Resource Guide °1) Find a Doctor and Pay Out of Pocket °Although you won't have to find out who is covered by your insurance plan, it is a good idea to ask around and get recommendations. You will then need to call the office and see if the doctor you have chosen will accept you as a new patient and what types of options they offer for patients who are self-pay. Some doctors offer discounts or will set up payment plans for their patients who do not have insurance, but you will need to ask so you aren't surprised when you get to your appointment. ° °2) Contact Your Local Health Department °Not all health departments have doctors that can see patients for sick visits, but many do, so it is worth a call to see if yours does. If you don't know where your local health department is, you can check in your phone book. The CDC also has a tool to help you locate your state's health department, and many state websites also have listings of all of their local health departments. ° °3) Find a Walk-in Clinic °If your illness is not likely to be very severe or complicated, you may want to try a walk in clinic. These are popping up all over the country in pharmacies, drugstores, and shopping centers. They're usually staffed by nurse practitioners or physician assistants that have been trained to treat common illnesses and complaints. They're usually fairly quick and inexpensive. However, if you have serious medical issues or chronic medical problems, these are probably not your best option. ° °No Primary Care Doctor: °- Call Health Connect at  832-8000 - they can help you locate a primary care doctor that  accepts your insurance, provides certain services, etc. °- Physician Referral Service- 1-800-533-3463 ° °Chronic Pain Problems: °Organization         Address  Phone   Notes  °[facility]  ([phone]) [phone] Patients need to be referred by their primary care doctor.  ° °Medication  Assistance: °Organization         Address  Phone   Notes  °[place] [facility] [address] °[place], [place] [address] (336) 641-8030 --Must be a resident of [place] °-- Must have NO insurance coverage whatsoever (no Medicaid/ Medicare, etc.) °-- The pt. MUST have a primary care doctor that directs their care regularly and follows them in the community °  °[facility]  (866) 331-1348   °[employer]  (888) 892-1162   ° °Agencies that provide inexpensive medical care: °Organization         Address  Phone   Notes  °[facility]  (336) 832-8035   °[facility]    (336) 832-7272   °[facility] [address] °[place], [place] [address] (336) 832-4777   °[facility] [address], °[place] (336) 271-4999   °[facility]    (336) 373-0678   °[facility]    (336) 272-1050   °[facility] ° [address], [place] Phone:  (336) 832-4444, Fax:  (336) 832-4440 Hours of Operation:  9 am - 6 pm, M-F.  Also accepts Medicaid/Medicare and self-pay.  °[facility] ° [address], [place] Phone: (336) 832-3150, Fax: (336) [phone]. Hours of Operation:  8:30 am - 5:30 pm, M-F.  Also accepts Medicaid and self-pay.  °[facility] [place] [address], [place] Phone: (336) 878-6027   °[facility] [address], [place], [place] (336)723-1848, Ext. 123 Mondays & Thursdays: 7-9 AM.  First 15 patients are seen on a first come, first serve basis. °  ° °Medicaid-accepting [place] Providers: ° °Organization         Address  Phone   Notes  °[facility] [address], [place] (336) 641-2100 Also accepts self-pay patients.  °[facility] [address], [place] ° (336) 856-9996   °[facility] [address], [place]  (336) 288-8857   °[facility] [address] [place] [address], [place] (336) 299-7000   °Veita Bland [address], [place]  ° (336) 373-1557 Only accepts [place] Access Medicaid patients after they have their name applied to their card.  ° °Self-Pay (no insurance) in [place]: ° °Organization         Address  Phone   Notes  °Sickle Cell Patients, [facility] [address], [place] (336) 832-1970   °[facility] [address], [place] (336) 832-4400   °[facility] ° [address], [place] (336) 992-4800   °[facility]/Dr. Osei-Bonsu ° [address] [place] [address], [place] or [address], [place] (336) [phone] Phone number for both [place] and [place] locations is the same.  °[facility] [address], [place] (336) 299-0000   °[facility] [address] [place] [address], [place] or [address] (336) 852-7530 °(336) 878-2260   °[facility] [address], [place] (336) 350-1642, phone; (336) 294-5005, fax Sees patients 1st and 3rd Saturday of every month.  Must not qualify for public or private insurance (i.e. Medicaid, Medicare, [place] Health Choice, Veterans' Benefits) • Household income should be no more than 200% of the poverty level •The clinic cannot treat you if you are pregnant or think you are pregnant • Sexually transmitted diseases are not treated at the clinic.  ° ° °Dental Care: °Organization         Address  Phone  Notes  °[place] [facility] [address], [place] (336) 641-6152 Accepts children up to age 21 who are enrolled in Medicaid or [employer] Health Choice; pregnant women with a Medicaid card; and children who have applied for Medicaid or [place] Health Choice, but were declined, whose parents can pay a reduced fee at time of service.  °[place]  [facility] [place]  [address], [place] (336) 641-7733 Accepts children up to age 21 who are enrolled in Medicaid or [place] Health Choice; pregnant women with a Medicaid card; and children who have applied for Medicaid or [place] Health Choice, but were declined, whose parents can pay a reduced fee at time of service.  °[facility] ° [address],  (336) 641-4533 Patients are seen by appointment only. Walk-ins are not accepted. [facility] will see patients 18 years of age and older. °Monday - Tuesday (8am-5pm) °Most Wednesdays (8:30-5pm) °$30 per visit, cash only  °[facility] ° [address], [place] (336) 641-4533 Patients are seen by appointment only. Walk-ins are not accepted. [facility] will see patients 18 years of age and older. °One   Wednesday Evening (Monthly: Volunteer Based).  $30 per visit, cash only  °[facility]  (919) 537-3737 for adults; Children under age 4, call Graduate Pediatric Dentistry at ([phone]) [phone]. Children aged 4-14, please call ([phone]) [phone] to request a pediatric application. ° Dental services are provided in all areas of dental care including fillings, crowns and bridges, complete and partial dentures, implants, gum treatment, root canals, and extractions. Preventive care is also provided. Treatment is provided to both adults and children. °Patients are selected via a lottery and there is often a waiting list. °  °[facility] [address], °[place] ° (336) 763-8833 [email or web address] °  °[facility] [address], [place], [place] (336)723-1848, Ext. 123 Second and Fourth Thursday of each month, opens at 6:30 AM; Clinic ends at 9 AM.  Patients are seen on a first-come first-served basis, and a limited number are seen during each clinic.  ° °[facility] ° [address], [place], [place] (336) 723-7904    Eligibility Requirements °You must have lived in [place], [place], or [place] counties for at least the last three months. °  You cannot be eligible for state or federal sponsored healthcare insurance, including Veterans Administration, Medicaid, or Medicare. °  You generally cannot be eligible for healthcare insurance through your employer.  °  How to apply: °Eligibility screenings are held every Tuesday and Wednesday afternoon from 1:00 pm until 4:00 pm. You do not need an appointment for the interview!  °[facility] [address], [place], [place] 336-631-2330   °[facility]  336-342-8273   °[place]  336-703-3100   ° [facility]  336-570-6415   ° °Behavioral Health Resources in the Community: °Intensive Outpatient Programs °Organization         Address  Phone  Notes  °[facility] [address], [place], [place] 336-878-6098   °[facility] [address], [place], [place] 336-832-9800   °[facility] [address], [place], [place] ° 336-882-2125   °[facility] [address],  °[place], [place] 1-800-853-5163 or 336-641-4981   °Substance Abuse Resources °Organization         Address  Phone  Notes  °[facility]  336-882-2125   °[facility]  336-784-9470   °[facility]  336-285-9073   °[facility]  336-845-3988   °Residential & Outpatient Substance Abuse Program  1-800-659-3381   °Psychological Services °Organization         Address  Phone  Notes  °[facility]  336- 832-9600   °[facility]  336- 378-7881   °[facility] [address], [place] 1-800-853-5163 or 336-641-4981   ° °Mobile Crisis Teams °Organization         Address  Phone  Notes  °[facility], Mobile Crisis Care Unit  1-877-626-1772   °Assertive °[facility] ° [address]  [place], [place] 336-834-9664   °Sharon DeEsch [address] °[place] 336-554-5454   ° °Self-Help/Support Groups °Organization         Address  Phone             Notes  °[facility] [place] - variety of support groups  336- 373-1402 Call for more information  °Narcotics Anonymous (NA), [facility] [address], °[place]   2 meetings at this location  ° °  Residential Treatment Programs Organization         Address  Phone  Notes  [facility] [address]    [place]  1-8067304191   [facility]  [address], [place] [address], [place], [place]   [place], [place] 310-451-6531 Admissions: 8am-3pm M-F  [facility] [place] [address],    [place], [place] 629-528-4132   [facility] [address] [place], [place], [place]   [facility] [address]  [place], [place]   [facility] - Intensive Outpatient [address], [place], [place]   [facility] ([place]  [place], [place] 1-340-369-5606 or (267)048-3825   [facility] [address] [place], [place] Accepts Medicaid  [facility] [place] [address]  [place] 1-(405) 329-3702 Substance Abuse/Addiction Treatment   [facility]         Address  Phone  Notes  [facility]  (402) 399-5388   Domenic Schwab, PhD [address] [place], [place]   5185693273 or 332-646-8986   [place], [place] 4781224360   [facility] [address], [place], [place] [phone] Insurance/Medicaid/sponsorship through [facility] [address]                                    [place], [place] 916-057-1205 [address], [place] [phone]    Dr. Adele Schilder  [phone]   [facility] Dept. 1) [address] [place] 2) [place] 3)  [address], [place] 786-595-4903 [phone]  (509)[phone]   [place] 878-809-5384 or 959-330-3664 (After Hours)      Take the prescriptions as directed.  Apply moist heat or ice to the area(s) of discomfort, for 15 minutes at a time, several times per day for the next few days.  Do not fall asleep on a heating or ice pack.  Call your regular medical doctor tomorrow to schedule a follow up appointment in the next 2 to 3 days.  Return to the Emergency Department immediately if worsening.

## 2015-01-14 NOTE — ED Notes (Signed)
Pt gone over for CT 
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
## 2015-02-03 ENCOUNTER — Encounter: Payer: Self-pay | Admitting: Neurology

## 2015-02-03 ENCOUNTER — Ambulatory Visit (INDEPENDENT_AMBULATORY_CARE_PROVIDER_SITE_OTHER): Payer: Self-pay | Admitting: Neurology

## 2015-02-03 VITALS — BP 144/77 | HR 58 | Ht 62.5 in | Wt 146.0 lb

## 2015-02-03 DIAGNOSIS — M542 Cervicalgia: Secondary | ICD-10-CM

## 2015-02-03 DIAGNOSIS — S134XXD Sprain of ligaments of cervical spine, subsequent encounter: Secondary | ICD-10-CM

## 2015-02-03 DIAGNOSIS — R269 Unspecified abnormalities of gait and mobility: Secondary | ICD-10-CM
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
## 2015-02-10 ENCOUNTER — Ambulatory Visit: Payer: Medicare Other | Attending: Neurology | Admitting: Physical Therapy

## 2015-02-10 DIAGNOSIS — R269 Unspecified abnormalities of gait and mobility: Secondary | ICD-10-CM | POA: Insufficient documentation

## 2015-02-10 DIAGNOSIS — M542 Cervicalgia: Secondary | ICD-10-CM
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
## 2015-02-13 ENCOUNTER — Ambulatory Visit: Payer: Medicare Other | Admitting: Physical Therapy

## 2015-02-13 DIAGNOSIS — M542 Cervicalgia: Secondary | ICD-10-CM | POA: Diagnosis not present

## 2015-02-13 NOTE — Patient Instructions (Signed)
  Flexibility: Upper Trapezius Stretch   Gently grasp right side of head while reaching behind back with other hand. Tilt head away until a gentle stretch is felt. Hold 30____ seconds. Repeat _3___ times per set. Do ____ sets per session. Do __2__ sessions per day.  http://orth.exer.us/340   Levator Stretch   Grasp seat or sit on hand on side to be stretched. Turn head toward other side and look down. Use hand on head to gently stretch neck in that position. Hold _30___ seconds. Repeat on other side. Repeat _3___ times. Do __2__ sessions per day.  http://[email or web address].exer.us/30   Scapular Retraction (Standing)   With arms at sides, pinch shoulder blades together. Repeat 10____ times per set. Do ____ sets per session. Do _2___ sessions per day.  http://orth.exer.us/944   Flexibility: Neck Retraction   Pull head straight back, keeping eyes and jaw level. Repeat _10___ times per set. Do ____ sets per session. Do __3__ sessions per day.  http://orth.exer.us/344   Posture - Sitting   Sit upright, head facing forward. Try using a roll to support lower back. Keep shoulders relaxed, and avoid rounded back. Keep hips level with knees. Avoid crossing legs for long periods.  Spinal Decompression: Lay on your back and imagine a string pulling your head toward the wall behind you and another pulling your feet toward the opposite wall. Hold 5 seconds . Do 10 times. As needed throughout the day.  Madelyn Flavors, PT 02/13/2015 11:57 AM [facility] [address] [place], [place], [address] Phone: [phone]   Fax:  [phone]

## 2015-02-13 NOTE — Therapy (Signed)
[facility] [place], [place], [address] Phone: [phone]   Fax:  [phone]  Physical Therapy Treatment  Patient Details  Name: Maria Jackson MRN: [id number] Date of Birth: [birth year] Referring Provider:  Marton Redwood, MD  Encounter Date: 02/13/2015      PT End of Session - 02/13/15 1121    Visit Number 2   Number of Visits 12   Date for PT Re-Evaluation 03/24/15   PT Start Time 1120   PT Stop Time 1159   PT Time Calculation (min) 39 min   Activity Tolerance Patient tolerated treatment well      Past Medical History  Diagnosis Date  . Allergy   . Arthritis     knees  . Osteopenia   . Raynaud disease   . Insomnia     NO LONGER AN ISSUE  . Spondylolisthesis     lumbar  . 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 Seasonal allergies   . Chronic lower back pain   . Concussion     Past Surgical History  Procedure Laterality Date  . Colonoscopy    . Tonsillectomy    . Cholecystectomy    . Appendectomy    . Vaginal hysterectomy      tah/bso  . Wisdom tooth extraction    . Foot neuroma surgery      bilateral  . Endoscopic vein laser treatment      calf riht and left  . Total knee arthroplasty Left 04/27/2013    Procedure: LEFT TOTAL KNEE ARTHROPLASTY;  Surgeon: Gearlean Alf, MD;  Location: [facility];  Service: Orthopedics;  Laterality: Left;  . Joint replacement    . Total knee arthroplasty Right 08/27/2013    Procedure: RIGHT TOTAL KNEE ARTHROPLASTY;  Surgeon: Gearlean Alf, MD;  Location: [facility];  Service: Orthopedics;  Laterality: Right;    There were no vitals filed for this visit.  Visit Diagnosis:  Neck pain      Subjective Assessment - 02/13/15 1122    Subjective I had a  few times yesterday where the dizziness felt better and some mornings feels better, but it is daily.   Currently in Pain? No/denies                         [facility] Adult PT  Treatment/Exercise - 02/13/15 0001    Exercises   Exercises Neck   Neck Exercises: Theraband   Scapula Retraction 10 reps   Neck Exercises: Standing   Neck Retraction 10 reps;5 secs  seated and supine   Neck Exercises: Supine   Other Supine Exercise spinal decompression using analogy of string pulling from head and feet   Modalities   Modalities Ultrasound   Ultrasound   Ultrasound Location right cervical spine and Rt lev/UT   Ultrasound Parameters 3.3 MHz 1.5wcm2 continuous x 10 minutes   Ultrasound Goals Pain   Manual Therapy   Manual Therapy Soft tissue mobilization   Soft tissue mobilization Rt UT, levator and paraspinals   Neck Exercises: Stretches   Upper Trapezius Stretch 1 rep;30 seconds   Levator Stretch 1 rep;30 seconds                PT Education - 02/13/15 1205    Education provided Yes   Education Details HEP- cerv/scap retraction, UT/Lev stretches, postural ed, decompression ex in supine   Person(s) Educated Patient   Methods Explanation;Demonstration;Handout  Comprehension Verbalized understanding;Returned demonstration          PT Short Term Goals - 02/10/15 1350    PT SHORT TERM GOAL #1   Title Ind with initial HEP.   Time 2   Period Weeks   Status New           PT Long Term Goals - 02/10/15 1350    PT LONG TERM GOAL #1   Title Negative Romberg test.   Time 6   Period Weeks   Status New   PT LONG TERM GOAL #2   Title Normal gait pattern without LOB.   Time 6   Period Weeks   Status New   PT LONG TERM GOAL #3   Title Bilateral active cervical rotation= 70 degrees so patient can turn her head more easliy while driving.   Time 6   Period Weeks   Status New   PT LONG TERM GOAL #4   Title Perform ADL's with pain not > 2/10.   Time 6   Period Weeks   Status New   PT LONG TERM GOAL #5   Title No c/o dizziness.   Time 6   Period Weeks   Status New               Plan - 02/13/15 1207    Clinical Impression Statement  Patient tolerated treatment well without increased pain. She reported some relief with cervical retraction and decompression exercises.    PT Next Visit Plan Continue US/stw prn; continue retraction/decompression and add balance activities.        Problem List Patient Active Problem List   Diagnosis Date Noted  . Postoperative anemia due to acute blood loss 08/28/2013  . OA (osteoarthritis) of knee 04/27/2013  . 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 Vaginal atrophy 05/25/2012  . Osteopenia 05/24/2011  . CONSTIPATION 08/27/2009  . ULCERATION OF INTESTINE 08/27/2009    Madelyn Flavors PT  02/13/2015, 12:11 PM  [facility] [address] [place], [place], [address] Phone: [phone]   Fax:  [phone]

## 2015-02-18 ENCOUNTER — Encounter: Payer: Self-pay | Admitting: *Deleted

## 2015-02-18 ENCOUNTER — Ambulatory Visit: Payer: Medicare Other | Admitting: *Deleted

## 2015-02-18 DIAGNOSIS — M542 Cervicalgia: Secondary | ICD-10-CM

## 2015-02-18 DIAGNOSIS — R269 Unspecified abnormalities of gait and mobility: Secondary | ICD-10-CM

## 2015-02-18 NOTE — Therapy (Signed)
[facility] [place], [place], [address] Phone: [phone]   Fax:  [phone]  Physical Therapy Treatment  Patient Details  Name: Maria Jackson MRN: [id number] Date of Birth: [birth year] Referring Provider:  Marton Redwood, MD  Encounter Date: 02/18/2015      PT End of Session - 02/18/15 1156    Visit Number 3   Number of Visits 12   Date for PT Re-Evaluation 03/24/15   PT Start Time 1115   PT Stop Time 2671   PT Time Calculation (min) 50 min      Past Medical History  Diagnosis Date  . Allergy   . Arthritis     knees  . Osteopenia   . Raynaud disease   . Insomnia     NO LONGER AN ISSUE  . Spondylolisthesis     lumbar  . 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 Seasonal allergies   . Chronic lower back pain   . Concussion     Past Surgical History  Procedure Laterality Date  . Colonoscopy    . Tonsillectomy    . Cholecystectomy    . Appendectomy    . Vaginal hysterectomy      tah/bso  . Wisdom tooth extraction    . Foot neuroma surgery      bilateral  . Endoscopic vein laser treatment      calf riht and left  . Total knee arthroplasty Left 04/27/2013    Procedure: LEFT TOTAL KNEE ARTHROPLASTY;  Surgeon: Gearlean Alf, MD;  Location: [facility];  Service: Orthopedics;  Laterality: Left;  . Joint replacement    . Total knee arthroplasty Right 08/27/2013    Procedure: RIGHT TOTAL KNEE ARTHROPLASTY;  Surgeon: Gearlean Alf, MD;  Location: [facility];  Service: Orthopedics;  Laterality: Right;    There were no vitals filed for this visit.  Visit Diagnosis:  Neck pain  Abnormality of gait      Subjective Assessment - 02/18/15 1133    Subjective I had a  few times yesterday where the dizziness felt better and some mornings feels better, but it is daily.   Limitations Walking   Patient Stated Goals Walk better and eliminate neck pain.   Currently in Pain? Yes   Pain Score 4    Pain  Location Neck   Pain Orientation Right   Pain Descriptors / Indicators Aching   Pain Onset More than a month ago   Pain Frequency Intermittent   Aggravating Factors  bending over, get dizzy still   Pain Relieving Factors rest and head support                         [facility] Adult PT Treatment/Exercise - 02/18/15 0001    Modalities   Modalities Ultrasound   Electrical Stimulation   Electrical Stimulation Location Right cervical region.Premod x 15 mins 80-150hz    Ultrasound   Ultrasound Location RT side cerv paras and levator   Ultrasound Parameters 1.5w/cm2 x 10 mins   Ultrasound Goals Pain   Manual Therapy   Manual Therapy Soft tissue mobilization;Myofascial release   Soft tissue mobilization Rt UT, levator and paraspinals   Myofascial Release TPR  to levator and Utrap,  Moist heat to neck with premod e-stim hooklying             PT Short Term Goals - 02/10/15 1350    PT SHORT TERM GOAL #1   Title Ind with initial HEP.   Time 2   Period Weeks   Status New           PT Long Term Goals - 02/10/15 1350    PT LONG TERM GOAL #1   Title Negative Romberg test.   Time 6   Period Weeks   Status New   PT LONG TERM GOAL #2   Title Normal gait pattern without LOB.   Time 6   Period Weeks   Status New   PT LONG TERM GOAL #3   Title Bilateral active cervical rotation= 70 degrees so patient can turn her head more easliy while driving.   Time 6   Period Weeks   Status New   PT LONG TERM GOAL #4   Title Perform ADL's with pain not > 2/10.   Time 6   Period Weeks   Status New   PT LONG TERM GOAL #5   Title No c/o dizziness.   Time 6   Period Weeks   Status New               Plan - 02/18/15 1159    Clinical Impression Statement Pt did fairly well today with Rx, but has notable TPs in levator, and cervical paras. Her ROM is still limited due  to pain and tightness. She still c/o dizziness at times. Goals are ongoing   Pt will benefit from skilled therapeutic intervention in order to improve on the following deficits Pain;Decreased activity tolerance   Rehab Potential Good   PT Frequency 3x / week   PT Duration 6 weeks   PT Treatment/Interventions ADLs/Self Care Home Management;Electrical Stimulation;Moist Heat;Ultrasound;Therapeutic exercise;Balance training;Neuromuscular re-education;Manual techniques   PT Next Visit Plan Continue US/stw prn; continue retraction/decompression and add balance activities.   Consulted and Agree with Plan of Care Patient        Problem List Patient Active Problem List   Diagnosis Date Noted  . Postoperative anemia due to acute blood loss 08/28/2013  . OA (osteoarthritis) of knee 04/27/2013  . 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 Vaginal atrophy 05/25/2012  . Osteopenia 05/24/2011  . CONSTIPATION 08/27/2009  . ULCERATION OF INTESTINE 08/27/2009    Amr Sturtevant,CHRIS,PTA 02/18/2015, 12:53 PM  [facility] [address] [place], [place], [address] Phone: [phone]   Fax:  [phone]

## 2015-02-20 ENCOUNTER — Ambulatory Visit: Payer: Medicare Other | Admitting: *Deleted

## 2015-02-20 ENCOUNTER — Encounter: Payer: Self-pay | Admitting: *Deleted

## 2015-02-20 DIAGNOSIS — M542 Cervicalgia: Secondary | ICD-10-CM | POA: Diagnosis not present

## 2015-02-20 DIAGNOSIS — R269 Unspecified abnormalities of gait and mobility: Secondary | ICD-10-CM

## 2015-02-20 NOTE — Therapy (Signed)
[facility] [place], [place], [address] Phone: [phone]   Fax:  [phone]  Physical Therapy Treatment  Patient Details  Name: Maria Jackson MRN: [id number] Date of Birth: 1945-10-12 Referring Provider:  Marton Redwood, MD  Encounter Date: 02/20/2015      PT End of Session - 02/20/15 1125    Visit Number 4   Number of Visits 12   Date for PT Re-Evaluation 03/24/15   PT Start Time 1115   PT Stop Time 5631   PT Time Calculation (min) 50 min      Past Medical History  Diagnosis Date  . Allergy   . Arthritis     knees  . Osteopenia   . Raynaud disease   . Insomnia     NO LONGER AN ISSUE  . Spondylolisthesis     lumbar  . 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 Seasonal allergies   . Chronic lower back pain   . Concussion     Past Surgical History  Procedure Laterality Date  . Colonoscopy    . Tonsillectomy    . Cholecystectomy    . Appendectomy    . Vaginal hysterectomy      tah/bso  . Wisdom tooth extraction    . Foot neuroma surgery      bilateral  . Endoscopic vein laser treatment      calf riht and left  . Total knee arthroplasty Left 04/27/2013    Procedure: LEFT TOTAL KNEE ARTHROPLASTY;  Surgeon: Gearlean Alf, MD;  Location: [facility];  Service: Orthopedics;  Laterality: Left;  . Joint replacement    . Total knee arthroplasty Right 08/27/2013    Procedure: RIGHT TOTAL KNEE ARTHROPLASTY;  Surgeon: Gearlean Alf, MD;  Location: [facility];  Service: Orthopedics;  Laterality: Right;    There were no vitals filed for this visit.  Visit Diagnosis:  Neck pain  Abnormality of gait      Subjective Assessment - 02/20/15 1127    Subjective I had a  few times yesterday where the dizziness felt better and some mornings feels better, but it is daily.. Felt better after last Rx   Limitations Walking   Patient Stated Goals Walk better and eliminate neck pain.   Currently in Pain?  Yes   Pain Score 4    Pain Location Neck   Pain Orientation Right   Pain Descriptors / Indicators Aching   Pain Onset More than a month ago   Pain Frequency Intermittent   Aggravating Factors  bending over, ADLs            [facility] PT Assessment - 02/20/15 0001    AROM   Overall AROM Comments ight active cervical rotation= 60 degrees and left= 50 degrees with bilateral sidebending= 15 degrees.                     [facility] Adult PT Treatment/Exercise - 02/20/15 0001    Modalities   Modalities Electrical Stimulation;Moist Heat;Ultrasound   Moist Heat Therapy   Number Minutes Moist Heat 15 Minutes   Moist Heat Location Cervical   Electrical Stimulation   Electrical Stimulation Location Right cervical region.Premod x 15 mins 80-150hz    Ultrasound   Ultrasound Location RT side   Ultrasound Parameters 1.5 w/cm2 x 10 mins to RT Levator, cerv. paras sitting   Ultrasound Goals Pain   Manual Therapy   Manual Therapy Soft tissue mobilization;Myofascial  release   Soft tissue mobilization Rt UT, levator and paraspinals with Pt sitting   Myofascial Release TPR  to levator and Utrap,cerv. Paras                  PT Short Term Goals - 02/10/15 1350    PT SHORT TERM GOAL #1   Title Ind with initial HEP.   Time 2   Period Weeks   Status New           PT Long Term Goals - 02/10/15 1350    PT LONG TERM GOAL #1   Title Negative Romberg test.   Time 6   Period Weeks   Status New   PT LONG TERM GOAL #2   Title Normal gait pattern without LOB.   Time 6   Period Weeks   Status New   PT LONG TERM GOAL #3   Title Bilateral active cervical rotation= 70 degrees so patient can turn her head more easliy while driving.   Time 6   Period Weeks   Status New   PT LONG TERM GOAL #4   Title Perform ADL's with pain not > 2/10.   Time 6   Period Weeks   Status New   PT LONG TERM GOAL #5   Title No c/o dizziness.   Time 6   Period Weeks   Status New                Plan - 02/20/15 1208    Clinical Impression Statement Pt did well with PT Rx today. She continues to get some relief and is able to do more at home. She feels that the longer the day goes on, her pain starts to get worse. Her cerv. ROM is about the same as on Eval. Goals are ongoing.   Pt will benefit from skilled therapeutic intervention in order to improve on the following deficits Pain;Decreased activity tolerance   PT Frequency 3x / week   PT Treatment/Interventions ADLs/Self Care Home Management;Electrical Stimulation;Moist Heat;Ultrasound;Therapeutic exercise;Balance training;Neuromuscular re-education;Manual techniques   PT Next Visit Plan Continue US/stw prn; continue retraction/decompression and add balance activities. Supine PROM when Tolerated        Problem List Patient Active Problem List   Diagnosis Date Noted  . Postoperative anemia due to acute blood loss 08/28/2013  . OA (osteoarthritis) of knee 04/27/2013  . 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 Vaginal atrophy 05/25/2012  . Osteopenia 05/24/2011  . CONSTIPATION 08/27/2009  . ULCERATION OF INTESTINE 08/27/2009    RAMSEUR,CHRIS, PTA 02/20/2015, 12:12 PM  [facility] [place], [place], [address] Phone: [phone]   Fax:  [phone]     , PTA

## 2015-02-24 ENCOUNTER — Ambulatory Visit: Payer: Medicare Other | Admitting: Physical Therapy

## 2015-02-24 ENCOUNTER — Encounter: Payer: Self-pay | Admitting: Physical Therapy

## 2015-02-24 DIAGNOSIS — M542 Cervicalgia: Secondary | ICD-10-CM

## 2015-02-24 DIAGNOSIS — R269 Unspecified abnormalities of gait and mobility: Secondary | ICD-10-CM

## 2015-02-24 NOTE — Therapy (Signed)
[facility] [place], [place], [address] Phone: [phone]   Fax:  [phone]  Physical Therapy Treatment  Patient Details  Name: Maria Jackson MRN: [id number] Date of Birth: 1945/10/08 Referring Provider:  Marton Redwood, MD  Encounter Date: 02/24/2015      PT End of Session - 02/24/15 1125    Visit Number 5   Number of Visits 12   Date for PT Re-Evaluation 03/24/15   PT Start Time 1114   PT Stop Time 1155   PT Time Calculation (min) 41 min   Activity Tolerance Patient tolerated treatment well   Behavior During Therapy [facility] for tasks assessed/performed      Past Medical History  Diagnosis Date  . Allergy   . Arthritis     knees  . Osteopenia   . Raynaud disease   . Insomnia     NO LONGER AN ISSUE  . Spondylolisthesis     lumbar  . 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 Seasonal allergies   . Chronic lower back pain   . Concussion     Past Surgical History  Procedure Laterality Date  . Colonoscopy    . Tonsillectomy    . Cholecystectomy    . Appendectomy    . Vaginal hysterectomy      tah/bso  . Wisdom tooth extraction    . Foot neuroma surgery      bilateral  . Endoscopic vein laser treatment      calf riht and left  . Total knee arthroplasty Left 04/27/2013    Procedure: LEFT TOTAL KNEE ARTHROPLASTY;  Surgeon: Gearlean Alf, MD;  Location: [facility];  Service: Orthopedics;  Laterality: Left;  . Joint replacement    . Total knee arthroplasty Right 08/27/2013    Procedure: RIGHT TOTAL KNEE ARTHROPLASTY;  Surgeon: Gearlean Alf, MD;  Location: [facility];  Service: Orthopedics;  Laterality: Right;    There were no vitals filed for this visit.  Visit Diagnosis:  Neck pain  Abnormality of gait      Subjective Assessment - 02/24/15 1122    Subjective felt better after last treatment then yesterday pain increased again per patient   Limitations Walking   Patient Stated Goals  Walk better and eliminate neck pain.   Currently in Pain? Yes   Pain Score 4    Pain Location Neck   Pain Orientation Right   Pain Descriptors / Indicators Aching;Sore   Pain Onset More than a month ago   Aggravating Factors  ADL's   Pain Relieving Factors rest            [facility] PT Assessment - 02/24/15 0001    AROM   Overall AROM  Deficits   Overall AROM Comments right 55 and left 50 degrees cervical rotation                     [facility] Adult PT Treatment/Exercise - 02/24/15 0001    Moist Heat Therapy   Number Minutes Moist Heat 15 Minutes   Moist Heat Location Cervical   Electrical Stimulation   Electrical Stimulation Location right c-spine/UT   Electrical Stimulation Action premod   Electrical Stimulation Parameters 80-150HZ    Electrical Stimulation Goals Pain   Ultrasound   Ultrasound Location right c-spine/UT   Ultrasound Parameters 1.5w/cm2/50%/78mz x158m   Ultrasound Goals Pain   Manual Therapy   Manual Therapy Soft tissue mobilization;Myofascial release   Soft  tissue mobilization Rt UT, levator and paraspinals with Pt sitting   Myofascial Release TPR  to levator and Utrap,cerv. Paras                  PT Short Term Goals - 02/24/15 1126    PT SHORT TERM GOAL #1   Title Ind with initial HEP.   Time 2   Period Weeks   Status Achieved           PT Long Term Goals - 02/10/15 1350    PT LONG TERM GOAL #1   Title Negative Romberg test.   Time 6   Period Weeks   Status New   PT LONG TERM GOAL #2   Title Normal gait pattern without LOB.   Time 6   Period Weeks   Status New   PT LONG TERM GOAL #3   Title Bilateral active cervical rotation= 70 degrees so patient can turn her head more easliy while driving.   Time 6   Period Weeks   Status New   PT LONG TERM GOAL #4   Title Perform ADL's with pain not > 2/10.   Time 6   Period Weeks   Status New   PT LONG TERM GOAL #5   Title No c/o dizziness.   Time 6   Period Weeks    Status New               Plan - 02/24/15 1127    Clinical Impression Statement Patient tolerated treatment well today and has had relief after treatments. Patient understands HEP and has met STG #1 other goals ongoing due to pain and balance deficits.    Pt will benefit from skilled therapeutic intervention in order to improve on the following deficits Pain;Decreased activity tolerance   Rehab Potential Good   PT Frequency 3x / week   PT Duration 6 weeks   PT Treatment/Interventions ADLs/Self Care Home Management;Electrical Stimulation;Moist Heat;Ultrasound;Therapeutic exercise;Balance training;Neuromuscular re-education;Manual techniques   PT Next Visit Plan Continue US/STW and add balance activities   Consulted and Agree with Plan of Care Patient        Problem List Patient Active Problem List   Diagnosis Date Noted  . Postoperative anemia due to acute blood loss 08/28/2013  . OA (osteoarthritis) of knee 04/27/2013  . 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 Vaginal atrophy 05/25/2012  . Osteopenia 05/24/2011  . CONSTIPATION 08/27/2009  . ULCERATION OF INTESTINE 08/27/2009    Phillips Climes, PTA 02/24/2015, 12:00 PM  [facility] [address] [place], [place], [address] Phone: [phone]   Fax:  [phone]

## 2015-02-27 ENCOUNTER — Encounter: Payer: Medicare Other | Admitting: *Deleted

## 2015-02-28 ENCOUNTER — Ambulatory Visit: Payer: Medicare Other | Admitting: *Deleted

## 2015-02-28 ENCOUNTER — Encounter: Payer: Self-pay | Admitting: *Deleted

## 2015-02-28 DIAGNOSIS — M542 Cervicalgia: Secondary | ICD-10-CM | POA: Diagnosis not present

## 2015-02-28 NOTE — Therapy (Signed)
[facility] [place], [place], [address] Phone: [phone]   Fax:  [phone]  Physical Therapy Treatment  Patient Details  Name: Maria Jackson MRN: [id number] Date of Birth: 12-Jul-1945 Referring Provider:  Marton Redwood, MD  Encounter Date: 02/28/2015      PT End of Session - 02/28/15 1137    Visit Number 6   Number of Visits 12   Date for PT Re-Evaluation 03/24/15   PT Start Time 1115   PT Stop Time 1205   PT Time Calculation (min) 50 min      Past Medical History  Diagnosis Date  . Allergy   . Arthritis     knees  . Osteopenia   . Raynaud disease   . Insomnia     NO LONGER AN ISSUE  . Spondylolisthesis     lumbar  . 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 Seasonal allergies   . Chronic lower back pain   . Concussion     Past Surgical History  Procedure Laterality Date  . Colonoscopy    . Tonsillectomy    . Cholecystectomy    . Appendectomy    . Vaginal hysterectomy      tah/bso  . Wisdom tooth extraction    . Foot neuroma surgery      bilateral  . Endoscopic vein laser treatment      calf riht and left  . Total knee arthroplasty Left 04/27/2013    Procedure: LEFT TOTAL KNEE ARTHROPLASTY;  Surgeon: Gearlean Alf, MD;  [facility]: [facility];  Service: Orthopedics;  Laterality: Left;  . Joint replacement    . Total knee arthroplasty Right 08/27/2013    Procedure: RIGHT TOTAL KNEE ARTHROPLASTY;  Surgeon: Gearlean Alf, MD;  [facility];  Service: Orthopedics;  Laterality: Right;    There were no vitals filed for this visit.  Visit Diagnosis:  Neck pain      Subjective Assessment - 02/28/15 1130    Subjective felt better after last treatment   Limitations Walking   Patient Stated Goals Walk better and eliminate neck pain.   Currently in Pain? Yes   Pain Score 5    Pain Location Neck   Pain Orientation Right   Pain Descriptors / Indicators Aching   Pain Onset More  than a month ago   Pain Frequency Intermittent   Aggravating Factors  ADLs   Pain Relieving Factors rest, PT Rxs                         [facility] Adult PT Treatment/Exercise - 02/28/15 0001    Modalities   Modalities Electrical Stimulation;Moist Heat;Ultrasound   Moist Heat Therapy   Number Minutes Moist Heat 15 Minutes   Moist Heat Location Cervical   Electrical Stimulation   Electrical Stimulation Location right c-spine/UT premod 80-150hz  x10 min   Electrical Stimulation Goals Pain   Ultrasound   Ultrasound Location RT side neck   Ultrasound Parameters 1.5 w/cm2 x 10 mins sitting   Ultrasound Goals Pain   Manual Therapy   Manual Therapy Soft tissue mobilization;Myofascial release   Soft tissue mobilization Rt UT, levator and paraspinals with Pt sitting   Myofascial Release TPR / IASTM to levator and Utrap,cerv. Paras                  PT Short Term Goals - 02/24/15 1126    PT SHORT  TERM GOAL #1   Title Ind with initial HEP.   Time 2   Period Weeks   Status Achieved           PT Long Term Goals - 02/28/15 1212    PT LONG TERM GOAL #1   Title Negative Romberg test.   Time 6   Period Weeks   Status Achieved   PT LONG TERM GOAL #2   Title Normal gait pattern without LOB.   Time 6   Period Weeks   Status Achieved   PT LONG TERM GOAL #3   Title Bilateral active cervical rotation= 70 degrees so patient can turn her head more easliy while driving.   Time 6   Period Weeks   Status On-going   PT LONG TERM GOAL #4   Title Perform ADL's with pain not > 2/10.   Time 6   Status On-going               Plan - 02/28/15 1138    Clinical Impression Statement Pt did fairly well today, but continues to have some pain and tightness in RT UT, levator scap, and Cerv paras. her ROM is progressing, but unable to meet goal yet. She does report that she has not had a dizzy spell since last week . she was able to meet 2 LTGs for balance today.Goals are  ongoing   Pt will benefit from skilled therapeutic intervention in order to improve on the following deficits Pain;Decreased activity tolerance   Rehab Potential Good   PT Frequency 3x / week   PT Duration 6 weeks   PT Treatment/Interventions ADLs/Self Care Home Management;Electrical Stimulation;Moist Heat;Ultrasound;Therapeutic exercise;Balance training;Neuromuscular re-education;Manual techniques   PT Next Visit Plan Continue US/STW and add balance activities   Consulted and Agree with Plan of Care Patient        Problem List Patient Active Problem List   Diagnosis Date Noted  . Postoperative anemia due to acute blood loss 08/28/2013  . OA (osteoarthritis) of knee 04/27/2013  . 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 Vaginal atrophy 05/25/2012  . Osteopenia 05/24/2011  . CONSTIPATION 08/27/2009  . ULCERATION OF INTESTINE 08/27/2009    Haydin Dunn,CHRIS, PTA 02/28/2015, 12:26 PM  [facility] [address] [place], [place], [address] Phone: [phone]   Fax:  [phone]

## 2015-03-03 ENCOUNTER — Encounter: Payer: Self-pay | Admitting: Physical Therapy

## 2015-03-03 ENCOUNTER — Ambulatory Visit: Payer: Medicare Other | Admitting: Physical Therapy

## 2015-03-03 DIAGNOSIS — M542 Cervicalgia: Secondary | ICD-10-CM | POA: Diagnosis not present

## 2015-03-03 DIAGNOSIS — R269 Unspecified abnormalities of gait and mobility: Secondary | ICD-10-CM

## 2015-03-03 NOTE — Therapy (Signed)
[facility] [place], [place], [address] Phone: [phone]   Fax:  [phone]  Physical Therapy Treatment  Patient Details  Name: Maria Jackson MRN: [id number] Date of Birth: 1946/06/13 Referring Provider:  Marton Redwood, MD  Encounter Date: 03/03/2015      PT End of Session - 03/03/15 1127    Visit Number 7   Number of Visits 12   Date for PT Re-Evaluation 03/24/15   PT Start Time 1115   PT Stop Time 1159   PT Time Calculation (min) 44 min   Activity Tolerance Patient tolerated treatment well   Behavior During Therapy [facility] for tasks assessed/performed      Past Medical History  Diagnosis Date  . Allergy   . Arthritis     knees  . Osteopenia   . Raynaud disease   . Insomnia     NO LONGER AN ISSUE  . Spondylolisthesis     lumbar  . 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 Seasonal allergies   . Chronic lower back pain   . Concussion     Past Surgical History  Procedure Laterality Date  . Colonoscopy    . Tonsillectomy    . Cholecystectomy    . Appendectomy    . Vaginal hysterectomy      tah/bso  . Wisdom tooth extraction    . Foot neuroma surgery      bilateral  . Endoscopic vein laser treatment      calf riht and left  . Total knee arthroplasty Left 04/27/2013    Procedure: LEFT TOTAL KNEE ARTHROPLASTY;  Surgeon: Gearlean Alf, MD;  [facility]: [facility];  Service: Orthopedics;  Laterality: Left;  . Joint replacement    . Total knee arthroplasty Right 08/27/2013    Procedure: RIGHT TOTAL KNEE ARTHROPLASTY;  Surgeon: Gearlean Alf, MD;  Location: [facility];  Service: Orthopedics;  Laterality: Right;    There were no vitals filed for this visit.  Visit Diagnosis:  Neck pain  Abnormality of gait      Subjective Assessment - 03/03/15 1122    Subjective very sore over weekend yet better today   Limitations Walking   Currently in Pain? Yes   Pain Score 4    Pain Location Neck    Pain Orientation Right   Pain Descriptors / Indicators Aching   Pain Type Acute pain   Pain Onset More than a month ago   Pain Frequency Intermittent   Aggravating Factors  ADL's   Pain Relieving Factors rest            [facility] PT Assessment - 03/03/15 0001    AROM   Overall AROM Comments right 65 degrees, left 50 degrees                     [facility] Adult PT Treatment/Exercise - 03/03/15 0001    Moist Heat Therapy   Number Minutes Moist Heat 15 Minutes   Moist Heat Location Cervical   Electrical Stimulation   Electrical Stimulation Location right c-spine/UT    Electrical Stimulation Action premod   Electrical Stimulation Parameters 80-150hz    Electrical Stimulation Goals Pain   Ultrasound   Ultrasound Location right c-spine/UT   Ultrasound Parameters 1.5w/cm2/50%/46mhz x33min   Ultrasound Goals Pain   Manual Therapy   Manual Therapy Soft tissue mobilization   Soft tissue mobilization gentle manual /IASTM to right side c-spine paraspinals/UT  PT Short Term Goals - 02/24/15 1126    PT SHORT TERM GOAL #1   Title Ind with initial HEP.   Time 2   Period Weeks   Status Achieved           PT Long Term Goals - 03/03/15 1159    PT LONG TERM GOAL #1   Title Negative Romberg test.   Time 6   Period Weeks   Status Achieved   PT LONG TERM GOAL #2   Title Normal gait pattern without LOB.   Time 6   Period Weeks   Status Achieved   PT LONG TERM GOAL #3   Title Bilateral active cervical rotation= 70 degrees so patient can turn her head more easliy while driving.   Time 6   Period Weeks   Status On-going  AROM 65 degrees, 50 Left degrees   PT LONG TERM GOAL #4   Title Perform ADL's with pain not > 2/10.   Time 6   Period Weeks   Status On-going   PT LONG TERM GOAL #5   Title No c/o dizziness.   Time 6   Period Weeks   Status On-going               Plan - 03/03/15 1154    Clinical Impression Statement Patient  tolerated treatment well today. Patient has improved with AROM cervical rotation on the right side only. Patient reported dizziness was gone until this past weekend returned some with sharp random  pains in temple area. Unable to meet goals due to pain and ROM limitations.   Pt will benefit from skilled therapeutic intervention in order to improve on the following deficits Pain;Decreased activity tolerance   Rehab Potential Good   PT Frequency 3x / week   PT Duration 6 weeks   PT Treatment/Interventions ADLs/Self Care Home Management;Electrical Stimulation;Moist Heat;Ultrasound;Therapeutic exercise;Balance training;Neuromuscular re-education;Manual techniques   PT Next Visit Plan Continue US/STW    Consulted and Agree with Plan of Care Patient        Problem List Patient Active Problem List   Diagnosis Date Noted  . Postoperative anemia due to acute blood loss 08/28/2013  . OA (osteoarthritis) of knee 04/27/2013  . 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 Vaginal atrophy 05/25/2012  . Osteopenia 05/24/2011  . CONSTIPATION 08/27/2009  . ULCERATION OF INTESTINE 08/27/2009    Phillips Climes, PTA 03/03/2015, 12:01 PM  [facility] [address] [place], [place], [address] Phone: [phone]   Fax:  [phone]

## 2015-03-05 ENCOUNTER — Encounter: Payer: Medicare Other | Admitting: Physical Therapy

## 2015-03-06 ENCOUNTER — Encounter: Payer: Self-pay | Admitting: *Deleted

## 2015-03-06 ENCOUNTER — Ambulatory Visit: Payer: Medicare Other | Attending: Neurology | Admitting: *Deleted

## 2015-03-06 DIAGNOSIS — R269 Unspecified abnormalities of gait and mobility: Secondary | ICD-10-CM | POA: Diagnosis present

## 2015-03-06 DIAGNOSIS — M542 Cervicalgia: Secondary | ICD-10-CM | POA: Diagnosis not present

## 2015-03-06 NOTE — Therapy (Signed)
[facility] [place], [place], [address] Phone: [phone]   Fax:  [phone]  Physical Therapy Treatment  Patient Details  Name: Maria Jackson MRN: [id number] Date of Birth: 09-Nov-1945 Referring Provider:  Marton Redwood, MD  Encounter Date: 03/06/2015      PT End of Session - 03/06/15 1035    Visit Number 8   Number of Visits 12   Date for PT Re-Evaluation 03/24/15   PT Start Time 0945   PT Stop Time 1036   PT Time Calculation (min) 51 min      Past Medical History  Diagnosis Date  . Allergy   . Arthritis     knees  . Osteopenia   . Raynaud disease   . Insomnia     NO LONGER AN ISSUE  . Spondylolisthesis     lumbar  . 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 Seasonal allergies   . Chronic lower back pain   . Concussion     Past Surgical History  Procedure Laterality Date  . Colonoscopy    . Tonsillectomy    . Cholecystectomy    . Appendectomy    . Vaginal hysterectomy      tah/bso  . Wisdom tooth extraction    . Foot neuroma surgery      bilateral  . Endoscopic vein laser treatment      calf riht and left  . Total knee arthroplasty Left 04/27/2013    Procedure: LEFT TOTAL KNEE ARTHROPLASTY;  Surgeon: Gearlean Alf, MD;  Location: [facility];  Service: Orthopedics;  Laterality: Left;  . Joint replacement    . Total knee arthroplasty Right 08/27/2013    Procedure: RIGHT TOTAL KNEE ARTHROPLASTY;  Surgeon: Gearlean Alf, MD;  Location: [facility];  Service: Orthopedics;  Laterality: Right;    There were no vitals filed for this visit.  Visit Diagnosis:  Neck pain  Abnormality of gait      Subjective Assessment - 03/06/15 0952    Subjective very sore after last Rx   Limitations Walking   Patient Stated Goals Walk better and eliminate neck pain.   Currently in Pain? Yes   Pain Score 3    Pain Location Neck   Pain Orientation Right   Pain Descriptors / Indicators Aching   Pain Type Acute pain   Pain Onset More than a month ago   Pain Frequency Intermittent   Pain Relieving Factors ADLs   Effect of Pain on Daily Activities rest            [facility] PT Assessment - 03/06/15 0001    AROM   Overall AROM  Deficits   Overall AROM Comments right 65 degrees, left 50 degrees                     [facility] Adult PT Treatment/Exercise - 03/06/15 0001    Modalities   Modalities Electrical Stimulation;Moist Heat;Ultrasound   Moist Heat Therapy   Number Minutes Moist Heat 15 Minutes   Moist Heat Location Cervical   Electrical Stimulation   Electrical Stimulation Location right c-spine/UT premod 80-150hz  x10 min   Electrical Stimulation Goals Pain   Ultrasound   Ultrasound Location RT cervical paras   Ultrasound Parameters 1.5 w/cm2 x10 mins sitting   Ultrasound Goals Pain   Manual Therapy   Manual Therapy Soft tissue mobilization   Soft tissue mobilization Rt UT, levator and paraspinals with Pt  supine,gentle manual traction   Myofascial Release  IASTM to levator and Utrap,cerv. Paras                                                                                                 Supine PROM for Bil. rotation              PT Short Term Goals - 02/24/15 1126    PT SHORT TERM GOAL #1   Title Ind with initial HEP.   Time 2   Period Weeks   Status Achieved           PT Long Term Goals - 03/06/15 1031    PT LONG TERM GOAL #1   Title Negative Romberg test.   Period Weeks   Status Achieved   PT LONG TERM GOAL #2   Title Normal gait pattern without LOB.   Period Weeks   Status Achieved   PT LONG TERM GOAL #3   Title Bilateral active cervical rotation= 70 degrees so patient can turn her head more easliy while driving.   Time 6   Period Weeks   Status On-going   PT LONG TERM GOAL #4   Title Perform ADL's with pain not > 2/10.   Time 6   Period Weeks   Status On-going   PT LONG TERM GOAL #5   Title No c/o dizziness.   Time 6    Period Weeks   Status Achieved               Problem List Patient Active Problem List   Diagnosis Date Noted  . Postoperative anemia due to acute blood loss 08/28/2013  . OA (osteoarthritis) of knee 04/27/2013  . 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 Vaginal atrophy 05/25/2012  . Osteopenia 05/24/2011  . CONSTIPATION 08/27/2009  . ULCERATION OF INTESTINE 08/27/2009    Nnamdi Dacus,CHRIS, PTA 03/06/2015, 10:36 AM  [facility] [address] [place], [place], [address] Phone: [phone]   Fax:  [phone]

## 2015-03-11 ENCOUNTER — Encounter: Payer: Self-pay | Admitting: *Deleted

## 2015-03-11 ENCOUNTER — Ambulatory Visit: Payer: Medicare Other | Admitting: *Deleted

## 2015-03-11 DIAGNOSIS — R269 Unspecified abnormalities of gait and mobility: Secondary | ICD-10-CM

## 2015-03-11 DIAGNOSIS — M542 Cervicalgia: Secondary | ICD-10-CM

## 2015-03-11 NOTE — Therapy (Signed)
[facility] [place], [place], [address] Phone: [phone]   Fax:  [phone]  Physical Therapy Treatment  Patient Details  Name: Maria Jackson MRN: [id number] Date of Birth: May 24, 1946 Referring Provider:  Marton Redwood, MD  Encounter Date: 03/11/2015      PT End of Session - 03/11/15 1301    Visit Number 9   Number of Visits 12   Date for PT Re-Evaluation 03/24/15   PT Start Time 1030   PT Stop Time 1121   PT Time Calculation (min) 51 min      Past Medical History  Diagnosis Date  . Allergy   . Arthritis     knees  . Osteopenia   . Raynaud disease   . Insomnia     NO LONGER AN ISSUE  . Spondylolisthesis     lumbar  . 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 Seasonal allergies   . Chronic lower back pain   . Concussion     Past Surgical History  Procedure Laterality Date  . Colonoscopy    . Tonsillectomy    . Cholecystectomy    . Appendectomy    . Vaginal hysterectomy      tah/bso  . Wisdom tooth extraction    . Foot neuroma surgery      bilateral  . Endoscopic vein laser treatment      calf riht and left  . Total knee arthroplasty Left 04/27/2013    Procedure: LEFT TOTAL KNEE ARTHROPLASTY;  Surgeon: Gearlean Alf, MD;  Location: [facility];  Service: Orthopedics;  Laterality: Left;  . Joint replacement    . Total knee arthroplasty Right 08/27/2013    Procedure: RIGHT TOTAL KNEE ARTHROPLASTY;  Surgeon: Gearlean Alf, MD;  Location: [facility];  Service: Orthopedics;  Laterality: Right;    There were no vitals filed for this visit.  Visit Diagnosis:  Neck pain  Abnormality of gait      Subjective Assessment - 03/11/15 1034    Subjective I had  neck pain when looking up at a tree this weekend.Still hurts on RT side when turning RT and LT   Limitations Walking   Patient Stated Goals Walk better and eliminate neck pain.   Currently in Pain? Yes   Pain Location Neck   Pain  Orientation Right   Pain Descriptors / Indicators Aching   Pain Type Acute pain   Pain Onset More than a month ago   Aggravating Factors  ADLs   Pain Relieving Factors rest                         [facility] Adult PT Treatment/Exercise - 03/11/15 0001    Modalities   Modalities Electrical Stimulation;Moist Heat;Ultrasound   Moist Heat Therapy   Number Minutes Moist Heat 15 Minutes   Moist Heat Location Cervical   Electrical Stimulation   Electrical Stimulation Location right c-spine/UT premod 80-150hz  x10 min   Electrical Stimulation Goals Pain   Ultrasound   Ultrasound Location RT cervical paras and UT   Ultrasound Parameters 1.5 w/cm2 x 10 min sitting   Ultrasound Goals Pain   Manual Therapy   Manual Therapy Soft tissue mobilization   Myofascial Release  IASTM  and TPR to levator and Utrap,cerv. Paras bilateral in sitting                  PT Short Term Goals -  02/24/15 1126    PT SHORT TERM GOAL #1   Title Ind with initial HEP.   Time 2   Period Weeks   Status Achieved           PT Long Term Goals - 03/06/15 1031    PT LONG TERM GOAL #1   Title Negative Romberg test.   Period Weeks   Status Achieved   PT LONG TERM GOAL #2   Title Normal gait pattern without LOB.   Period Weeks   Status Achieved   PT LONG TERM GOAL #3   Title Bilateral active cervical rotation= 70 degrees so patient can turn her head more easliy while driving.   Time 6   Period Weeks   Status On-going   PT LONG TERM GOAL #4   Title Perform ADL's with pain not > 2/10.   Time 6   Period Weeks   Status On-going   PT LONG TERM GOAL #5   Title No c/o dizziness.   Time 6   Period Weeks   Status Achieved               Problem List Patient Active Problem List   Diagnosis Date Noted  . Postoperative anemia due to acute blood loss 08/28/2013  . OA (osteoarthritis) of knee 04/27/2013  . 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 Vaginal atrophy  05/25/2012  . Osteopenia 05/24/2011  . CONSTIPATION 08/27/2009  . ULCERATION OF INTESTINE 08/27/2009    RAMSEUR,CHRIS, PTA 03/11/2015, 1:48 PM  [facility] [address] [place], [place], [address] Phone: [phone]   Fax:  [phone]

## 2015-03-13 ENCOUNTER — Encounter: Payer: Self-pay | Admitting: *Deleted

## 2015-03-13 ENCOUNTER — Telehealth: Payer: Self-pay | Admitting: Neurology

## 2015-03-13 ENCOUNTER — Ambulatory Visit: Payer: Medicare Other | Admitting: *Deleted

## 2015-03-13 DIAGNOSIS — M542 Cervicalgia: Secondary | ICD-10-CM

## 2015-03-13 DIAGNOSIS — R269 Unspecified abnormalities of gait and mobility: Secondary | ICD-10-CM

## 2015-03-13 NOTE — Telephone Encounter (Signed)
Patient called stating PT is really helping,ultra sound, tens unit, soft tissue therapy, stretching cervical spine. She is still having pain above right ear, down shoulder blade to spine and up into head with certain movements. Still having some pain and discomfort. She would really like to get more PT visit. Does she need an OV or can the PT be called in. Please call and advise.  Patient can be reached at [phone] and [phone].

## 2015-03-13 NOTE — Therapy (Signed)
[facility] [place], [place], [address] Phone: [phone]   Fax:  [phone]  Physical Therapy Treatment  Patient Details  Name: Maria Jackson MRN: [id number] Date of Birth: 1945/10/07 Referring Provider:  Marton Redwood, MD  Encounter Date: 03/13/2015      PT End of Session - 03/13/15 1041    Visit Number 10   Number of Visits 12   Date for PT Re-Evaluation 03/24/15   PT Start Time 1030   PT Stop Time 1121   PT Time Calculation (min) 51 min      Past Medical History  Diagnosis Date  . Allergy   . Arthritis     knees  . Osteopenia   . Raynaud disease   . Insomnia     NO LONGER AN ISSUE  . Spondylolisthesis     lumbar  . 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 Seasonal allergies   . Chronic lower back pain   . Concussion     Past Surgical History  Procedure Laterality Date  . Colonoscopy    . Tonsillectomy    . Cholecystectomy    . Appendectomy    . Vaginal hysterectomy      tah/bso  . Wisdom tooth extraction    . Foot neuroma surgery      bilateral  . Endoscopic vein laser treatment      calf riht and left  . Total knee arthroplasty Left 04/27/2013    Procedure: LEFT TOTAL KNEE ARTHROPLASTY;  Surgeon: Gearlean Alf, MD;  Location: [facility];  Service: Orthopedics;  Laterality: Left;  . Joint replacement    . Total knee arthroplasty Right 08/27/2013    Procedure: RIGHT TOTAL KNEE ARTHROPLASTY;  Surgeon: Gearlean Alf, MD;  Location: [facility];  Service: Orthopedics;  Laterality: Right;    There were no vitals filed for this visit.  Visit Diagnosis:  Neck pain  Abnormality of gait      Subjective Assessment - 03/13/15 1044    Subjective I had  neck pain when looking up at a tree this weekend.Still hurts on RT side when turning RT and LT. Rt side really bothered me yesterday   Limitations Walking   Patient Stated Goals Walk better and eliminate neck pain.   Currently in  Pain? Yes   Pain Score 3    Pain Location Neck   Pain Orientation Right   Pain Descriptors / Indicators Aching;Constant   Pain Type Acute pain   Pain Onset More than a month ago   Aggravating Factors  ADLs   Pain Relieving Factors rest                         [facility] Adult PT Treatment/Exercise - 03/13/15 0001    Modalities   Modalities Electrical Stimulation;Moist Heat;Ultrasound   Moist Heat Therapy   Number Minutes Moist Heat 15 Minutes   Moist Heat Location Cervical   Electrical Stimulation   Electrical Stimulation Location right c-spine/UT premod 80-150hz  x10 min   Electrical Stimulation Goals Pain   Ultrasound   Ultrasound Location RT cervical paras and Levator   Ultrasound Parameters 1.5 w/cm2 x 10 mins sitting 1 mhz   Ultrasound Goals Pain   Manual Therapy   Manual Therapy Soft tissue mobilization   Soft tissue mobilization Rt UT, levator and paraspinals with Pt supine,gentle manual traction   Myofascial Release  IASTM  and TPR to  levator and Utrap,cerv. Paras bilateral in sitting                   PT Short Term Goals - 02/24/15 1126    PT SHORT TERM GOAL #1   Title Ind with initial HEP.   Time 2   Period Weeks   Status Achieved           PT Long Term Goals - 03/06/15 1031    PT LONG TERM GOAL #1   Title Negative Romberg test.   Period Weeks   Status Achieved   PT LONG TERM GOAL #2   Title Normal gait pattern without LOB.   Period Weeks   Status Achieved   PT LONG TERM GOAL #3   Title Bilateral active cervical rotation= 70 degrees so patient can turn her head more easliy while driving.   Time 6   Period Weeks   Status On-going   PT LONG TERM GOAL #4   Title Perform ADL's with pain not > 2/10.   Time 6   Period Weeks   Status On-going   PT LONG TERM GOAL #5   Title No c/o dizziness.   Time 6   Period Weeks   Status Achieved               Plan - 2015/04/03 1932    PT Next Visit Plan Okay to start int mechanical  traction at 16#.          G-Codes - 04-03-2015 1257    Functional Assessment Tool Used FOTO 10th visit 54% limited   Functional Limitation Mobility: Walking and moving around   Mobility: Walking and Moving Around Current Status [id number]) At least 40 percent but less than 60 percent impaired, limited or restricted   Mobility: Walking and Moving Around Goal Status [phone]) At least 1 percent but less than 20 percent impaired, limited or restricted      Problem List Patient Active Problem List   Diagnosis Date Noted  . Postoperative anemia due to acute blood loss 08/28/2013  . OA (osteoarthritis) of knee 04/27/2013  . 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 Vaginal atrophy 05/25/2012  . Osteopenia 05/24/2011  . CONSTIPATION 08/27/2009  . ULCERATION OF INTESTINE 08/27/2009    Jaqwan Wieber, [place] MPT 03-Apr-2015, 7:34 PM  [facility] [address] [place], [place], [address] Phone: [phone]   Fax:  [phone]

## 2015-03-13 NOTE — Telephone Encounter (Signed)
Spoke to patient - she still has three visits left with PT.  Per Dr. Krista Blue, have PT evaluate when this series is complete and send orders if extra visits are needed.  Patient agreeable to this plan.

## 2015-03-13 NOTE — Therapy (Signed)
[facility] [place], [place], [address] Phone: [phone]   Fax:  [phone]  Physical Therapy Treatment  Patient Details  Name: Maria Jackson MRN: [id number] Date of Birth: 1946-01-01 Referring Provider:  Marton Redwood, MD  Encounter Date: 03/13/2015      PT End of Session - 03/13/15 1041    Visit Number 10   Number of Visits 12   Date for PT Re-Evaluation 03/24/15   PT Start Time 1030   PT Stop Time 1121   PT Time Calculation (min) 51 min      Past Medical History  Diagnosis Date  . Allergy   . Arthritis     knees  . Osteopenia   . Raynaud disease   . Insomnia     NO LONGER AN ISSUE  . Spondylolisthesis     lumbar  . 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 Seasonal allergies   . Chronic lower back pain   . Concussion     Past Surgical History  Procedure Laterality Date  . Colonoscopy    . Tonsillectomy    . Cholecystectomy    . Appendectomy    . Vaginal hysterectomy      tah/bso  . Wisdom tooth extraction    . Foot neuroma surgery      bilateral  . Endoscopic vein laser treatment      calf riht and left  . Total knee arthroplasty Left 04/27/2013    Procedure: LEFT TOTAL KNEE ARTHROPLASTY;  Surgeon: Gearlean Alf, MD;  Location: [facility];  Service: Orthopedics;  Laterality: Left;  . Joint replacement    . Total knee arthroplasty Right 08/27/2013    Procedure: RIGHT TOTAL KNEE ARTHROPLASTY;  Surgeon: Gearlean Alf, MD;  Location: [facility];  Service: Orthopedics;  Laterality: Right;    There were no vitals filed for this visit.  Visit Diagnosis:  Neck pain  Abnormality of gait      Subjective Assessment - 03/13/15 1044    Subjective I had  neck pain when looking up at a tree this weekend.Still hurts on RT side when turning RT and LT. Rt side really bothered me yesterday   Limitations Walking   Patient Stated Goals Walk better and eliminate neck pain.   Currently in  Pain? Yes   Pain Score 3    Pain Location Neck   Pain Orientation Right   Pain Descriptors / Indicators Aching;Constant   Pain Type Acute pain   Pain Onset More than a month ago   Aggravating Factors  ADLs   Pain Relieving Factors rest                         [facility] Adult PT Treatment/Exercise - 03/13/15 0001    Modalities   Modalities Electrical Stimulation;Moist Heat;Ultrasound   Moist Heat Therapy   Number Minutes Moist Heat 15 Minutes   Moist Heat Location Cervical   Electrical Stimulation   Electrical Stimulation Location right c-spine/UT premod 80-150hz  x10 min   Electrical Stimulation Goals Pain   Ultrasound   Ultrasound Location RT cervical paras and Levator   Ultrasound Parameters 1.5 w/cm2 x 10 mins sitting 1 mhz   Ultrasound Goals Pain   Manual Therapy   Manual Therapy Soft tissue mobilization   Soft tissue mobilization Rt UT, levator and paraspinals with Pt supine,gentle manual traction   Myofascial Release  IASTM  and TPR to  levator and Utrap,cerv. Paras bilateral in sitting                   PT Short Term Goals - 02/24/15 1126    PT SHORT TERM GOAL #1   Title Ind with initial HEP.   Time 2   Period Weeks   Status Achieved           PT Long Term Goals - 03/06/15 1031    PT LONG TERM GOAL #1   Title Negative Romberg test.   Period Weeks   Status Achieved   PT LONG TERM GOAL #2   Title Normal gait pattern without LOB.   Period Weeks   Status Achieved   PT LONG TERM GOAL #3   Title Bilateral active cervical rotation= 70 degrees so patient can turn her head more easliy while driving.   Time 6   Period Weeks   Status On-going   PT LONG TERM GOAL #4   Title Perform ADL's with pain not > 2/10.   Time 6   Period Weeks   Status On-going   PT LONG TERM GOAL #5   Title No c/o dizziness.   Time 6   Period Weeks   Status Achieved               Plan - 04/02/2015 1259    Clinical Impression Statement Pt continues to  have some good days when her neck feels better, but then have a bad day where she feels worse. We have tried deeper STW, but Pt feels that it makes her neck ache later. She has responded well with manual traction to decrease tension in and around cevical paras.. Goals are ongoing    Pt will benefit from skilled therapeutic intervention in order to improve on the following deficits Pain;Decreased activity tolerance   Rehab Potential Good   PT Frequency 3x / week   PT Duration 6 weeks   PT Treatment/Interventions ADLs/Self Care Home Management;Electrical Stimulation;Moist Heat;Ultrasound;Therapeutic exercise;Balance training;Neuromuscular re-education;Manual techniques   PT Next Visit Plan Continue US/STW  Manual traction,   Ask about Mechanical traction   Consulted and Agree with Plan of Care Patient          G-Codes - Apr 02, 2015 1257    Functional Assessment Tool Used FOTO 10th visit 54% limited      Problem List Patient Active Problem List   Diagnosis Date Noted  . Postoperative anemia due to acute blood loss 08/28/2013  . OA (osteoarthritis) of knee 04/27/2013  . 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 Vaginal atrophy 05/25/2012  . Osteopenia 05/24/2011  . CONSTIPATION 08/27/2009  . ULCERATION OF INTESTINE 08/27/2009    Demarrius Guerrero,CHRIS, PTA 04-02-15, 5:43 PM  [facility] [address] [place], [place], [address] Phone: [phone]   Fax:  [phone]

## 2015-03-18 ENCOUNTER — Ambulatory Visit: Payer: Medicare Other | Admitting: *Deleted

## 2015-03-18 DIAGNOSIS — M542 Cervicalgia: Secondary | ICD-10-CM | POA: Diagnosis not present

## 2015-03-18 NOTE — Therapy (Signed)
[facility] [place], [place], [address] Phone: [phone]   Fax:  [phone]  Physical Therapy Treatment  Patient Details  Name: BLAKELEIGH DOMEK MRN: [id number] Date of Birth: 1946-02-27 Referring Provider:  Marton Redwood, MD  Encounter Date: 03/18/2015      PT End of Session - 03/18/15 1809    Visit Number 11   Number of Visits 12   Date for PT Re-Evaluation 03/24/15   PT Start Time 1030   PT Stop Time 1119   PT Time Calculation (min) 49 min      Past Medical History  Diagnosis Date  . Allergy   . Arthritis     knees  . Osteopenia   . Raynaud disease   . Insomnia     NO LONGER AN ISSUE  . Spondylolisthesis     lumbar  . 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 Seasonal allergies   . Chronic lower back pain   . Concussion     Past Surgical History  Procedure Laterality Date  . Colonoscopy    . Tonsillectomy    . Cholecystectomy    . Appendectomy    . Vaginal hysterectomy      tah/bso  . Wisdom tooth extraction    . Foot neuroma surgery      bilateral  . Endoscopic vein laser treatment      calf riht and left  . Total knee arthroplasty Left 04/27/2013    Procedure: LEFT TOTAL KNEE ARTHROPLASTY;  Surgeon: Gearlean Alf, MD;  Location: [facility];  Service: Orthopedics;  Laterality: Left;  . Joint replacement    . Total knee arthroplasty Right 08/27/2013    Procedure: RIGHT TOTAL KNEE ARTHROPLASTY;  Surgeon: Gearlean Alf, MD;  Location: [facility];  Service: Orthopedics;  Laterality: Right;    There were no vitals filed for this visit.  Visit Diagnosis:  Neck pain          [facility] PT Assessment - 03/18/15 0001    AROM   Overall AROM  Deficits   Overall AROM Comments right 65 degrees, left 52 degrees                     [facility] Adult PT Treatment/Exercise - 03/18/15 0001    Modalities   Modalities Electrical Stimulation;Moist Heat;Ultrasound   Moist Heat Therapy    Number Minutes Moist Heat 15 Minutes   Moist Heat Location Cervical   Electrical Stimulation   Electrical Stimulation Location right c-spine/UT premod 80-150hz  x10 min   Electrical Stimulation Goals Pain   Ultrasound   Ultrasound Location RT side cervical paras and Levator   Ultrasound Parameters 1.5 w/cm2 x 10 mins in sitting   Ultrasound Goals Pain   Manual Therapy   Manual Therapy Myofascial release;Passive ROM;Manual Traction;Soft tissue mobilization   Soft tissue mobilization Rt UT, levator and paraspinals with Pt supine,gentle manual traction   Myofascial Release  IASTM  and TPR to levator and Utrap,cerv. Paras bilateral in sitting    Passive ROM Passive stretching for Cervical rotation LT and RT with pt supine                  PT Short Term Goals - 02/24/15 1126    PT SHORT TERM GOAL #1   Title Ind with initial HEP.   Time 2   Period Weeks   Status Achieved  PT Long Term Goals - 03/06/15 1031    PT LONG TERM GOAL #1   Title Negative Romberg test.   Period Weeks   Status Achieved   PT LONG TERM GOAL #2   Title Normal gait pattern without LOB.   Period Weeks   Status Achieved   PT LONG TERM GOAL #3   Title Bilateral active cervical rotation= 70 degrees so patient can turn her head more easliy while driving.   Time 6   Period Weeks   Status On-going   PT LONG TERM GOAL #4   Title Perform ADL's with pain not > 2/10.   Time 6   Period Weeks   Status On-going   PT LONG TERM GOAL #5   Title No c/o dizziness.   Time 6   Period Weeks   Status Achieved               Plan - 03/18/15 1810    Clinical Impression Statement Pt has had a good last few days with less pain over the weekend. She did well again with Rx today, but there are still notable tightness and tendernes along cervical paras and levator scapula. Her cervical rotation ROM  is  about the same. May try mechanicall traction next week. LTGs for ROM and pain  are ongoing   Pt  will benefit from skilled therapeutic intervention in order to improve on the following deficits Pain;Decreased activity tolerance   Rehab Potential Good   PT Frequency 3x / week   PT Duration 6 weeks   PT Treatment/Interventions ADLs/Self Care Home Management;Electrical Stimulation;Moist Heat;Ultrasound;Therapeutic exercise;Balance training;Neuromuscular re-education;Manual techniques;Traction   PT Next Visit Plan Okay to start int mechanical traction at 16#.as per MPT. Medicare Recert   Consulted and Agree with Plan of Care Patient        Problem List Patient Active Problem List   Diagnosis Date Noted  . Postoperative anemia due to acute blood loss 08/28/2013  . OA (osteoarthritis) of knee 04/27/2013  . 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 Vaginal atrophy 05/25/2012  . Osteopenia 05/24/2011  . CONSTIPATION 08/27/2009  . ULCERATION OF INTESTINE 08/27/2009    Liberato Stansbery,CHRIS, PTA 03/18/2015, 6:21 PM  [facility] [address] [place], [place], [address] Phone: [phone]   Fax:  [phone]

## 2015-03-20 ENCOUNTER — Encounter: Payer: Self-pay | Admitting: *Deleted

## 2015-03-20 ENCOUNTER — Ambulatory Visit: Payer: Medicare Other | Admitting: *Deleted

## 2015-03-20 DIAGNOSIS — R269 Unspecified abnormalities of gait and mobility: Secondary | ICD-10-CM

## 2015-03-20 DIAGNOSIS — M542 Cervicalgia: Secondary | ICD-10-CM | POA: Diagnosis not present

## 2015-03-20 NOTE — Therapy (Signed)
[facility] [place], [place], [address] Phone: [phone]   Fax:  [phone]  Physical Therapy Treatment  Patient Details  Name: Maria Jackson MRN: [id number] Date of Birth: 1945/08/24 Referring Provider:  Marton Redwood, MD  Encounter Date: 03/20/2015      PT End of Session - 03/20/15 1113    Visit Number 12   Number of Visits 24   Date for PT Re-Evaluation 05/18/15   PT Start Time 1030   PT Stop Time 1129   PT Time Calculation (min) 59 min      Past Medical History  Diagnosis Date  . Allergy   . Arthritis     knees  . Osteopenia   . Raynaud disease   . Insomnia     NO LONGER AN ISSUE  . Spondylolisthesis     lumbar  . 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 Seasonal allergies   . Chronic lower back pain   . Concussion     Past Surgical History  Procedure Laterality Date  . Colonoscopy    . Tonsillectomy    . Cholecystectomy    . Appendectomy    . Vaginal hysterectomy      tah/bso  . Wisdom tooth extraction    . Foot neuroma surgery      bilateral  . Endoscopic vein laser treatment      calf riht and left  . Total knee arthroplasty Left 04/27/2013    Procedure: LEFT TOTAL KNEE ARTHROPLASTY;  Surgeon: Gearlean Alf, MD;  Location: [facility];  Service: Orthopedics;  Laterality: Left;  . Joint replacement    . Total knee arthroplasty Right 08/27/2013    Procedure: RIGHT TOTAL KNEE ARTHROPLASTY;  Surgeon: Gearlean Alf, MD;  Location: [facility];  Service: Orthopedics;  Laterality: Right;    There were no vitals filed for this visit.  Visit Diagnosis:  Neck pain  Abnormality of gait      Subjective Assessment - 03/20/15 1114    Subjective Doing better with less pain. Pain 2-3/10, but goes up to 5/10 with RT rotation and looking up, Able to do daily Acts with less pain   Limitations Walking   Patient Stated Goals Walk better and eliminate neck pain.   Currently in Pain? Yes    Pain Score 3    Pain Location Neck   Pain Orientation Right   Pain Descriptors / Indicators Aching;Constant   Pain Type Acute pain   Pain Onset More than a month ago   Pain Frequency Intermittent   Aggravating Factors  ADls   Pain Relieving Factors rest            [facility] PT Assessment - 03/20/15 0001    AROM   Overall AROM  Deficits   Overall AROM Comments right 70 degrees, left 58 degrees                     [facility] Adult PT Treatment/Exercise - 03/20/15 0001    Moist Heat Therapy   Number Minutes Moist Heat 15 Minutes   Moist Heat Location Cervical   Electrical Stimulation   Electrical Stimulation Location right c-spine/UT premod 80-150hz  x10 min   Electrical Stimulation Goals Pain   Ultrasound   Ultrasound Location RT side UT and into cerv paras   Ultrasound Parameters Combo @ 1.5 w/cm2 x 10 mins in sitting   Ultrasound Goals Pain   Manual Therapy  Manual Therapy Myofascial release;Passive ROM;Manual Traction;Soft tissue mobilization   Soft tissue mobilization Rt UT, levator and paraspinals with Pt supine,gentle manual traction   Myofascial Release  IASTM  and TPR to levator and Utrap,cerv. Paras bilateral in sitting    Passive ROM Passive stretching for Cervical rotation LT and RT with end-range holds with pt supine                  PT Short Term Goals - 02/24/15 1126    PT SHORT TERM GOAL #1   Title Ind with initial HEP.   Time 2   Period Weeks   Status Achieved           PT Long Term Goals - 03/06/15 1031    PT LONG TERM GOAL #1   Title Negative Romberg test.   Period Weeks   Status Achieved   PT LONG TERM GOAL #2   Title Normal gait pattern without LOB.   Period Weeks   Status Achieved   PT LONG TERM GOAL #3   Title Bilateral active cervical rotation= 70 degrees so patient can turn her head more easliy while driving.   Time 6   Period Weeks   Status On-going   PT LONG TERM GOAL #4   Title Perform ADL's with pain not >  2/10.   Time 6   Period Weeks   Status On-going   PT LONG TERM GOAL #5   Title No c/o dizziness.   Time 6   Period Weeks   Status Achieved               Plan - 03/20/15 1144    Clinical Impression Statement Pt did better today with less pain and had increased cervical rotation motion to LT and RT. She was able to meet goal to RT, but not LT due to tightness and pain. No mechanical Tx today due to Pt was sore a suboccipital area. Recert done to cont. PT   Pt will benefit from skilled therapeutic intervention in order to improve on the following deficits Pain;Decreased activity tolerance   PT Frequency 3x / week   PT Duration 6 weeks   PT Treatment/Interventions ADLs/Self Care Home Management;Electrical Stimulation;Moist Heat;Ultrasound;Therapeutic exercise;Balance training;Neuromuscular re-education;Manual techniques;Traction   PT Next Visit Plan Okay to start int mechanical traction at 16#.as per MPT. when ready   Consulted and Agree with Plan of Care Patient        Problem List Patient Active Problem List   Diagnosis Date Noted  . Postoperative anemia due to acute blood loss 08/28/2013  . OA (osteoarthritis) of knee 04/27/2013  . 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 Vaginal atrophy 05/25/2012  . Osteopenia 05/24/2011  . CONSTIPATION 08/27/2009  . ULCERATION OF INTESTINE 08/27/2009    Hyacinth Marcelli,CHRIS, PTA 03/20/2015, 11:52 AM  [facility] [address] [place], [place], [address] Phone: [phone]   Fax:  [phone]

## 2015-03-21 ENCOUNTER — Encounter: Payer: Medicare Other | Admitting: *Deleted

## 2015-03-25 ENCOUNTER — Encounter: Payer: Self-pay | Admitting: *Deleted

## 2015-03-25 ENCOUNTER — Ambulatory Visit: Payer: Medicare Other | Admitting: *Deleted

## 2015-03-25 DIAGNOSIS — M542 Cervicalgia: Secondary | ICD-10-CM

## 2015-03-25 DIAGNOSIS — R269 Unspecified abnormalities of gait and mobility: Secondary | ICD-10-CM

## 2015-03-25 NOTE — Therapy (Signed)
[facility] [place], [place], [address] Phone: [phone]   Fax:  [phone]  Physical Therapy Treatment  Patient Details  Name: Maria Jackson MRN: [id number] Date of Birth: Aug 12, 1945 Referring Provider:  Marton Redwood, MD  Encounter Date: 03/25/2015      PT End of Session - 03/25/15 1213    Visit Number 13   Number of Visits 24   Date for PT Re-Evaluation 05/18/15   PT Start Time 7169   PT Stop Time 1120   PT Time Calculation (min) 48 min      Past Medical History  Diagnosis Date  . Allergy   . Arthritis     knees  . Osteopenia   . Raynaud disease   . Insomnia     NO LONGER AN ISSUE  . Spondylolisthesis     lumbar  . 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 Seasonal allergies   . Chronic lower back pain   . Concussion     Past Surgical History  Procedure Laterality Date  . Colonoscopy    . Tonsillectomy    . Cholecystectomy    . Appendectomy    . Vaginal hysterectomy      tah/bso  . Wisdom tooth extraction    . Foot neuroma surgery      bilateral  . Endoscopic vein laser treatment      calf riht and left  . Total knee arthroplasty Left 04/27/2013    Procedure: LEFT TOTAL KNEE ARTHROPLASTY;  Surgeon: Gearlean Alf, MD;  Location: [facility];  Service: Orthopedics;  Laterality: Left;  . Joint replacement    . Total knee arthroplasty Right 08/27/2013    Procedure: RIGHT TOTAL KNEE ARTHROPLASTY;  Surgeon: Gearlean Alf, MD;  Location: [facility];  Service: Orthopedics;  Laterality: Right;    There were no vitals filed for this visit.  Visit Diagnosis:  Neck pain  Abnormality of gait      Subjective Assessment - 03/25/15 1215    Subjective Doing better with less pain. Pain 2-3/10, but goes up to 5/10 with RT rotation an side bending Able to do daily Acts with less pain now   Limitations Walking   Patient Stated Goals Walk better and eliminate neck pain.   Currently in Pain?  Yes   Pain Score 3    Pain Location Neck   Pain Orientation Right   Pain Descriptors / Indicators Aching;Sore;Tightness   Pain Type Acute pain   Pain Onset More than a month ago   Pain Frequency Intermittent   Aggravating Factors  ADLs   Pain Relieving Factors rest, PT Rxs                         [facility] Adult PT Treatment/Exercise - 03/25/15 0001    Modalities   Modalities Electrical Stimulation;Moist Heat;Ultrasound   Moist Heat Therapy   Number Minutes Moist Heat 15 Minutes   Moist Heat Location Cervical   Electrical Stimulation   Electrical Stimulation Location right c-spine/UT premod 80-150hz  x10 min   Electrical Stimulation Goals Pain   Ultrasound   Ultrasound Location RT side Levator/cervical paras   Ultrasound Parameters Combo x 10 mins 1.5 w/cm2   Ultrasound Goals Pain   Manual Therapy   Manual Therapy Myofascial release;Passive ROM;Manual Traction;Soft tissue mobilization   Soft tissue mobilization Rt UT, levator and paraspinals with Pt supine,gentle manual traction   Myofascial Release  IASTM  and TPR to levator and Utrap,cerv. Paras bilateral in sitting    Passive ROM Passive stretching for Cervical rotation LT and RT with end-range holds with pt supine                  PT Short Term Goals - 02/24/15 1126    PT SHORT TERM GOAL #1   Title Ind with initial HEP.   Time 2   Period Weeks   Status Achieved           PT Long Term Goals - 03/06/15 1031    PT LONG TERM GOAL #1   Title Negative Romberg test.   Period Weeks   Status Achieved   PT LONG TERM GOAL #2   Title Normal gait pattern without LOB.   Period Weeks   Status Achieved   PT LONG TERM GOAL #3   Title Bilateral active cervical rotation= 70 degrees so patient can turn her head more easliy while driving.   Time 6   Period Weeks   Status On-going   PT LONG TERM GOAL #4   Title Perform ADL's with pain not > 2/10.   Time 6   Period Weeks   Status On-going   PT LONG  TERM GOAL #5   Title No c/o dizziness.   Time 6   Period Weeks   Status Achieved               Plan - 03/25/15 1219    Clinical Impression Statement Pt continues to progress and is doing better each week. She is able to turn further with cervical rotation now and is able to look up now without the pain. She is still very tender with palpation over cervical pars, Utrap, and Levator scap.   Pt will benefit from skilled therapeutic intervention in order to improve on the following deficits Pain;Decreased activity tolerance   PT Frequency 3x / week   PT Duration 6 weeks   PT Treatment/Interventions ADLs/[facility] Management;Electrical Stimulation;Moist Heat;Ultrasound;Therapeutic exercise;Balance training;Neuromuscular re-education;Manual techniques;Traction   PT Next Visit Plan Okay to start int mechanical traction at 16#.as per MPT. when ready   Consulted and Agree with Plan of Care Patient        Problem List Patient Active Problem List   Diagnosis Date Noted  . Postoperative anemia due to acute blood loss 08/28/2013  . OA (osteoarthritis) of knee 04/27/2013  . 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 Vaginal atrophy 05/25/2012  . Osteopenia 05/24/2011  . CONSTIPATION 08/27/2009  . ULCERATION OF INTESTINE 08/27/2009    RAMSEUR,CHRIS, PTA 03/25/2015, 12:25 PM  [facility] [address] [place], [place], [address] Phone: [phone]   Fax:  [phone]

## 2015-03-26 ENCOUNTER — Ambulatory Visit: Payer: Medicare Other | Admitting: Physical Therapy

## 2015-03-26 ENCOUNTER — Encounter: Payer: Self-pay | Admitting: Physical Therapy

## 2015-03-26 DIAGNOSIS — R269 Unspecified abnormalities of gait and mobility: Secondary | ICD-10-CM

## 2015-03-26 DIAGNOSIS — M542 Cervicalgia: Secondary | ICD-10-CM | POA: Diagnosis not present

## 2015-03-26 NOTE — Therapy (Signed)
[facility] [place], [place], [address] Phone: [phone]   Fax:  [phone]  Physical Therapy Treatment  Patient Details  Name: Maria Jackson MRN: [id number] Date of Birth: [birth year] Referring Provider:  Marton Redwood, MD  Encounter Date: 03/26/2015      PT End of Session - 03/26/15 1209    Visit Number 14   Number of Visits 24   Date for PT Re-Evaluation 05/18/15   PT Start Time 1118   PT Stop Time 1213   PT Time Calculation (min) 55 min   Activity Tolerance Patient tolerated treatment well   Behavior During Therapy [facility] for tasks assessed/performed      Past Medical History  Diagnosis Date  . Allergy   . Arthritis     knees  . Osteopenia   . Raynaud disease   . Insomnia     NO LONGER AN ISSUE  . Spondylolisthesis     lumbar  . 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 Seasonal allergies   . Chronic lower back pain   . Concussion     Past Surgical History  Procedure Laterality Date  . Colonoscopy    . Tonsillectomy    . Cholecystectomy    . Appendectomy    . Vaginal hysterectomy      tah/bso  . Wisdom tooth extraction    . Foot neuroma surgery      bilateral  . Endoscopic vein laser treatment      calf riht and left  . Total knee arthroplasty Left 04/27/2013    Procedure: LEFT TOTAL KNEE ARTHROPLASTY;  Surgeon: Gearlean Alf, MD;  Location: [facility];  Service: Orthopedics;  Laterality: Left;  . Joint replacement    . Total knee arthroplasty Right 08/27/2013    Procedure: RIGHT TOTAL KNEE ARTHROPLASTY;  Surgeon: Gearlean Alf, MD;  Location: [facility];  Service: Orthopedics;  Laterality: Right;    There were no vitals filed for this visit.  Visit Diagnosis:  Neck pain  Abnormality of gait      Subjective Assessment - 03/26/15 1202    Subjective Reports doing better today. Normally has uncomfortable feeling the day after treatments but is not uncomfortable today. Has a  3/10 pain with stretches though and has an ache but did not give pain rating.   Limitations Walking   Patient Stated Goals Walk better and eliminate neck pain.            [facility] PT Assessment - 03/26/15 0001    Assessment   Medical Diagnosis Neck pain.  Abnormality of gait.   Onset Date/Surgical Date 01/14/15                     [facility] Adult PT Treatment/Exercise - 03/26/15 0001    Modalities   Modalities Electrical Stimulation;Moist Heat;Ultrasound   Moist Heat Therapy   Number Minutes Moist Heat 15 Minutes   Moist Heat Location Cervical   Electrical Stimulation   Electrical Stimulation Location B cervical paraspinals/ UT   Electrical Stimulation Action Pre-Mod   Electrical Stimulation Parameters  80-150 Hz x15 5on/5off   Electrical Stimulation Goals Pain   Ultrasound   Ultrasound Location R UT/ cervical paraspinals   Ultrasound Parameters Combo 1.5 w/cm2, 100%, 50mhz x12 min   Ultrasound Goals Pain   Manual Therapy   Manual Therapy Myofascial release;Manual Traction   Myofascial Release MFR/TPR to R Upper Trap, Levator Scapula, cervical paraspinals  to decrese tightness and pain   Manual Traction Manual cervical traction in supine with LE bolster to improve ROM and decrease pain                  PT Short Term Goals - 02/24/15 1126    PT SHORT TERM GOAL #1   Title Ind with initial HEP.   Time 2   Period Weeks   Status Achieved           PT Long Term Goals - 03/06/15 1031    PT LONG TERM GOAL #1   Title Negative Romberg test.   Period Weeks   Status Achieved   PT LONG TERM GOAL #2   Title Normal gait pattern without LOB.   Period Weeks   Status Achieved   PT LONG TERM GOAL #3   Title Bilateral active cervical rotation= 70 degrees so patient can turn her head more easliy while driving.   Time 6   Period Weeks   Status On-going   PT LONG TERM GOAL #4   Title Perform ADL's with pain not > 2/10.   Time 6   Period Weeks   Status  On-going   PT LONG TERM GOAL #5   Title No c/o dizziness.   Time 6   Period Weeks   Status Achieved               Plan - 03/26/15 1217    Clinical Impression Statement Patient continues to progress with PT regarding pain with reports of decreased pain verbalized today. Tolerated all manual therapy and modalities well without complaint of increased pain. Continues to have R UT, Levaotr Scapula tightness noted during manual therapy. Tolerated gentle manual ttraction well without complaint of increased pain. Normal modaliites response noted following removal of the modaliites. Reported experiencing ache only during cervical stretches following treatment today.   Pt will benefit from skilled therapeutic intervention in order to improve on the following deficits Pain;Decreased activity tolerance   Rehab Potential Good   PT Frequency 3x / week   PT Duration 6 weeks   PT Treatment/Interventions ADLs/Self Care Home Management;Electrical Stimulation;Moist Heat;Ultrasound;Therapeutic exercise;Balance training;Neuromuscular re-education;Manual techniques;Traction   PT Next Visit Plan Okay to start int mechanical traction at 16#.as per MPT. when ready   Consulted and Agree with Plan of Care Patient        Problem List Patient Active Problem List   Diagnosis Date Noted  . Postoperative anemia due to acute blood loss 08/28/2013  . OA (osteoarthritis) of knee 04/27/2013  . 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 Vaginal atrophy 05/25/2012  . Osteopenia 05/24/2011  . CONSTIPATION 08/27/2009  . ULCERATION OF INTESTINE 08/27/2009    Wynelle Fanny, PTA 03/26/2015, 12:23 PM  [facility] [address] [place], [place], [address] Phone: [phone]   Fax:  [phone]

## 2015-04-01 ENCOUNTER — Ambulatory Visit: Payer: Medicare Other | Admitting: *Deleted

## 2015-04-01 ENCOUNTER — Encounter: Payer: Self-pay | Admitting: *Deleted

## 2015-04-01 DIAGNOSIS — M542 Cervicalgia: Secondary | ICD-10-CM | POA: Diagnosis not present

## 2015-04-01 DIAGNOSIS — R269 Unspecified abnormalities of gait and mobility: Secondary | ICD-10-CM

## 2015-04-01 NOTE — Therapy (Signed)
[facility] [place], [place], [address] Phone: [phone]   Fax:  [phone]  Physical Therapy Treatment  Patient Details  Name: Maria Jackson MRN: [id number] Date of Birth: Nov 18, 1945 Referring Provider:  Marton Redwood, MD  Encounter Date: 04/01/2015      PT End of Session - 04/01/15 1252    Visit Number 15   Number of Visits 24   Date for PT Re-Evaluation 05/18/15   PT Start Time 1116   PT Stop Time 8341   PT Time Calculation (min) 48 min      Past Medical History  Diagnosis Date  . Allergy   . Arthritis     knees  . Osteopenia   . Raynaud disease   . Insomnia     NO LONGER AN ISSUE  . Spondylolisthesis     lumbar  . 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 Seasonal allergies   . Chronic lower back pain   . Concussion     Past Surgical History  Procedure Laterality Date  . Colonoscopy    . Tonsillectomy    . Cholecystectomy    . Appendectomy    . Vaginal hysterectomy      tah/bso  . Wisdom tooth extraction    . Foot neuroma surgery      bilateral  . Endoscopic vein laser treatment      calf riht and left  . Total knee arthroplasty Left 04/27/2013    Procedure: LEFT TOTAL KNEE ARTHROPLASTY;  Surgeon: Gearlean Alf, MD;  Location: [facility];  Service: Orthopedics;  Laterality: Left;  . Joint replacement    . Total knee arthroplasty Right 08/27/2013    Procedure: RIGHT TOTAL KNEE ARTHROPLASTY;  Surgeon: Gearlean Alf, MD;  Location: [facility];  Service: Orthopedics;  Laterality: Right;    There were no vitals filed for this visit.  Visit Diagnosis:  Neck pain  Abnormality of gait      Subjective Assessment - 04/01/15 1132    Subjective Had a good weekend with less pain with ADL's. 2-3/10   Limitations Walking   Patient Stated Goals Walk better and eliminate neck pain.   Currently in Pain? Yes   Pain Score 3    Pain Location Neck   Pain Orientation Right   Pain  Descriptors / Indicators Aching;Sore;Tightness   Pain Type Acute pain   Pain Onset More than a month ago   Pain Frequency Intermittent   Aggravating Factors  ADLs   Pain Relieving Factors rest, PT Rxs                         [facility] Adult PT Treatment/Exercise - 04/01/15 0001    Exercises   Exercises Neck   Neck Exercises: Machines for Strengthening   UBE (Upper Arm Bike) UBE at 120 RPMs x 5 mins   Other Machines for Strengthening Elliptical x 29mins L5,,R5   Other Machines for Strengthening Nustep L5, x 5 mins   Moist Heat Therapy   Number Minutes Moist Heat 15 Minutes   Moist Heat Location Cervical   Electrical Stimulation   Electrical Stimulation Location right c-spine/UT premod 80-150hz  x10 min   Electrical Stimulation Goals Pain   Ultrasound   Ultrasound Location RT UT and levator   Ultrasound Parameters combo x 10 mins at 1.5 w/cm2   Ultrasound Goals Pain  PT Short Term Goals - 02/24/15 1126    PT SHORT TERM GOAL #1   Title Ind with initial HEP.   Time 2   Period Weeks   Status Achieved           PT Long Term Goals - 03/06/15 1031    PT LONG TERM GOAL #1   Title Negative Romberg test.   Period Weeks   Status Achieved   PT LONG TERM GOAL #2   Title Normal gait pattern without LOB.   Period Weeks   Status Achieved   PT LONG TERM GOAL #3   Title Bilateral active cervical rotation= 70 degrees so patient can turn her head more easliy while driving.   Time 6   Period Weeks   Status On-going   PT LONG TERM GOAL #4   Title Perform ADL's with pain not > 2/10.   Time 6   Period Weeks   Status On-going   PT LONG TERM GOAL #5   Title No c/o dizziness.   Time 6   Period Weeks   Status Achieved               Plan - 04/01/15 1253    Clinical Impression Statement Pt did great today and was able to return to some exs in the clinic. She tolerated about 15 mins of ex without increased pain. Notable tension still  noted in RT levator and UT, but less. Goals are ongoing   Pt will benefit from skilled therapeutic intervention in order to improve on the following deficits Pain;Decreased activity tolerance   Rehab Potential Good   PT Frequency 3x / week   PT Duration 6 weeks   PT Treatment/Interventions ADLs/Self Care Home Management;Electrical Stimulation;Moist Heat;Ultrasound;Therapeutic exercise;Balance training;Neuromuscular re-education;Manual techniques;Traction   PT Next Visit Plan continue to add therex as tolerated and cont with other        Problem List Patient Active Problem List   Diagnosis Date Noted  . Postoperative anemia due to acute blood loss 08/28/2013  . OA (osteoarthritis) of knee 04/27/2013  . 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 Vaginal atrophy 05/25/2012  . Osteopenia 05/24/2011  . CONSTIPATION 08/27/2009  . ULCERATION OF INTESTINE 08/27/2009    RAMSEUR,CHRIS , PTA  04/01/2015, 12:59 PM  [facility] [address] [place], [place], [address] Phone: [phone]   Fax:  [phone]

## 2015-04-03 ENCOUNTER — Encounter: Payer: Self-pay | Admitting: *Deleted

## 2015-04-03 ENCOUNTER — Ambulatory Visit: Payer: Medicare Other | Admitting: *Deleted

## 2015-04-03 DIAGNOSIS — M542 Cervicalgia: Secondary | ICD-10-CM

## 2015-04-03 DIAGNOSIS — R269 Unspecified abnormalities of gait and mobility: Secondary | ICD-10-CM

## 2015-04-03 NOTE — Therapy (Signed)
[facility] [place], [place], [address] Phone: [phone]   Fax:  [phone]  Physical Therapy Treatment  Patient Details  Name: Maria Jackson MRN: [id number] Date of Birth: [birth year] Referring Garreth Burnsworth:  Marton Redwood, MD  Encounter Date: 04/03/2015      PT End of Session - 04/03/15 1142    Visit Number 16   Number of Visits 24   Date for PT Re-Evaluation 05/18/15   PT Start Time 1115   PT Stop Time 1205   PT Time Calculation (min) 50 min      Past Medical History  Diagnosis Date  . Allergy   . Arthritis     knees  . Osteopenia   . Raynaud disease   . Insomnia     NO LONGER AN ISSUE  . Spondylolisthesis     lumbar  . 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 Seasonal allergies   . Chronic lower back pain   . Concussion     Past Surgical History  Procedure Laterality Date  . Colonoscopy    . Tonsillectomy    . Cholecystectomy    . Appendectomy    . Vaginal hysterectomy      tah/bso  . Wisdom tooth extraction    . Foot neuroma surgery      bilateral  . Endoscopic vein laser treatment      calf riht and left  . Total knee arthroplasty Left 04/27/2013    Procedure: LEFT TOTAL KNEE ARTHROPLASTY;  Surgeon: Gearlean Alf, MD;  Location: [facility];  Service: Orthopedics;  Laterality: Left;  . Joint replacement    . Total knee arthroplasty Right 08/27/2013    Procedure: RIGHT TOTAL KNEE ARTHROPLASTY;  Surgeon: Gearlean Alf, MD;  Location: [facility];  Service: Orthopedics;  Laterality: Right;    There were no vitals filed for this visit.  Visit Diagnosis:  Neck pain  Abnormality of gait      Subjective Assessment - 04/03/15 1139    Subjective Was sore from new exs after last RX   Limitations Walking   Patient Stated Goals Walk better and eliminate neck pain.   Currently in Pain? Yes   Pain Score 4    Pain Location Neck   Pain Orientation Right   Pain Descriptors / Indicators  Aching;Tightness   Pain Type Acute pain   Pain Onset More than a month ago   Pain Frequency Intermittent   Aggravating Factors  ADLs   Pain Relieving Factors rest                         [facility] Adult PT Treatment/Exercise - 04/03/15 0001    Exercises   Exercises Neck   Neck Exercises: Machines for Strengthening   UBE (Upper Arm Bike) UBE at 120 RPMs x 5 mins   Other Machines for Strengthening Elliptical x 70mins L5,,R5   Other Machines for Strengthening Nustep L5, x 5 mins   Modalities   Modalities Electrical Stimulation;Moist Heat;Ultrasound   Moist Heat Therapy   Number Minutes Moist Heat 15 Minutes   Moist Heat Location Cervical   Electrical Stimulation   Electrical Stimulation Location right c-spine/UT premod 80-150hz  x10 min   Electrical Stimulation Goals Pain   Ultrasound   Ultrasound Location RT Levator and UT   Ultrasound Parameters Combo at 1.5 w/cm2 x 10 mins   Ultrasound Goals Pain  PT Short Term Goals - 02/24/15 1126    PT SHORT TERM GOAL #1   Title Ind with initial HEP.   Time 2   Period Weeks   Status Achieved           PT Long Term Goals - 03/06/15 1031    PT LONG TERM GOAL #1   Title Negative Romberg test.   Period Weeks   Status Achieved   PT LONG TERM GOAL #2   Title Normal gait pattern without LOB.   Period Weeks   Status Achieved   PT LONG TERM GOAL #3   Title Bilateral active cervical rotation= 70 degrees so patient can turn her head more easliy while driving.   Time 6   Period Weeks   Status On-going   PT LONG TERM GOAL #4   Title Perform ADL's with pain not > 2/10.   Time 6   Period Weeks   Status On-going   PT LONG TERM GOAL #5   Title No c/o dizziness.   Time 6   Period Weeks   Status Achieved               Plan - 04/03/15 1253    Clinical Impression Statement Pt was sorer today than last Rx and had notable increased tightness as well along RT side cervical paras, levator and  UT due to new exs. She did well with todays Rx and was able to reproduce the same exs and did well. Her cerv rotation motion was stillok RT rotation 72 degrees and LT 62 degrees.. Goals are on-going   Pt will benefit from skilled therapeutic intervention in order to improve on the following deficits Pain;Decreased activity tolerance   Rehab Potential Good   PT Frequency 3x / week   PT Duration 6 weeks   PT Treatment/Interventions ADLs/Self Care Home Management;Electrical Stimulation;Moist Heat;Ultrasound;Therapeutic exercise;Balance training;Neuromuscular re-education;Manual techniques;Traction   PT Next Visit Plan continue to add therex as tolerated and cont with other   Consulted and Agree with Plan of Care Patient        Problem List Patient Active Problem List   Diagnosis Date Noted  . Postoperative anemia due to acute blood loss 08/28/2013  . OA (osteoarthritis) of knee 04/27/2013  . 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 Vaginal atrophy 05/25/2012  . Osteopenia 05/24/2011  . CONSTIPATION 08/27/2009  . ULCERATION OF INTESTINE 08/27/2009    RAMSEUR,CHRIS, PTA 04/03/2015, 1:00 PM  [facility] [address] [place], [place], [address] Phone: [phone]   Fax:  [phone]

## 2015-04-08 ENCOUNTER — Ambulatory Visit: Payer: Medicare Other | Attending: Neurology | Admitting: *Deleted

## 2015-04-08 ENCOUNTER — Encounter: Payer: Self-pay | Admitting: *Deleted

## 2015-04-08 DIAGNOSIS — M542 Cervicalgia: Secondary | ICD-10-CM

## 2015-04-08 DIAGNOSIS — R269 Unspecified abnormalities of gait and mobility: Secondary | ICD-10-CM | POA: Diagnosis present

## 2015-04-08 NOTE — Therapy (Signed)
[facility] [place], [place], [address] Phone: [phone]   Fax:  [phone]  Physical Therapy Treatment  Patient Details  Name: Maria Jackson MRN: [id number] Date of Birth: [birth year] Referring Provider:  Marton Redwood, MD  Encounter Date: 04/08/2015      PT End of Session - 04/08/15 1028    Visit Number 17   Number of Visits 24   Date for PT Re-Evaluation 05/18/15   PT Start Time 1030   PT Stop Time 1129   PT Time Calculation (min) 59 min      Past Medical History  Diagnosis Date  . Allergy   . Arthritis     knees  . Osteopenia   . Raynaud disease   . Insomnia     NO LONGER AN ISSUE  . Spondylolisthesis     lumbar  . Cancer ([place]) 11/05/11    SQUAMOS CELL CARCINOMA - LEFT CALF  . Seasonal allergies   . Chronic lower back pain   . Concussion     Past Surgical History  Procedure Laterality Date  . Colonoscopy    . Tonsillectomy    . Cholecystectomy    . Appendectomy    . Vaginal hysterectomy      tah/bso  . Wisdom tooth extraction    . Foot neuroma surgery      bilateral  . Endoscopic vein laser treatment      calf riht and left  . Total knee arthroplasty Left 04/27/2013    Procedure: LEFT TOTAL KNEE ARTHROPLASTY;  Surgeon: Gearlean Alf, MD;  Location: [facility];  Service: Orthopedics;  Laterality: Left;  . Joint replacement    . Total knee arthroplasty Right 08/27/2013    Procedure: RIGHT TOTAL KNEE ARTHROPLASTY;  Surgeon: Gearlean Alf, MD;  Location: [facility];  Service: Orthopedics;  Laterality: Right;    There were no vitals filed for this visit.  Visit Diagnosis:  Neck pain  Abnormality of gait      Subjective Assessment - 04/08/15 1029    Currently in Pain? Yes   Pain Score 2    Pain Location Neck   Pain Orientation Right   Pain Descriptors / Indicators Aching;Tightness   Pain Type Acute pain   Pain Onset More than a month ago   Pain Frequency Intermittent   Aggravating  Factors  ADLs   Pain Relieving Factors rest                         [facility] Adult PT Treatment/Exercise - 04/08/15 0001    Exercises   Exercises Neck   Neck Exercises: Machines for Strengthening   UBE (Upper Arm Bike) UBE at 120 RPMs x 5 mins   Other Machines for Strengthening Elliptical x 6 mins L5,,R5   Other Machines for Strengthening Nustep L5, x 5 mins   Modalities   Modalities Electrical Stimulation;Moist Heat;Ultrasound   Moist Heat Therapy   Number Minutes Moist Heat 15 Minutes   Moist Heat Location Cervical   Electrical Stimulation   Electrical Stimulation Location right c-spine/UT premod 80-150hz  x15 min   Electrical Stimulation Goals Pain   Ultrasound   Ultrasound Location RT levator and UT   Ultrasound Parameters combo x 10 mins at 1.5 w/cm2   Ultrasound Goals Pain   Manual Therapy   Myofascial Release MFR/TPR to R Upper Trap, Levator Scapula, cervical paraspinals to decrese tightness and pain   Passive ROM --  PT Short Term Goals - 02/24/15 1126    PT SHORT TERM GOAL #1   Title Ind with initial HEP.   Time 2   Period Weeks   Status Achieved           PT Long Term Goals - 03/06/15 1031    PT LONG TERM GOAL #1   Title Negative Romberg test.   Period Weeks   Status Achieved   PT LONG TERM GOAL #2   Title Normal gait pattern without LOB.   Period Weeks   Status Achieved   PT LONG TERM GOAL #3   Title Bilateral active cervical rotation= 70 degrees so patient can turn her head more easliy while driving.   Time 6   Period Weeks   Status On-going   PT LONG TERM GOAL #4   Title Perform ADL's with pain not > 2/10.   Time 6   Period Weeks   Status On-going   PT LONG TERM GOAL #5   Title No c/o dizziness.   Time 6   Period Weeks   Status Achieved               Plan - 04/08/15 1028    Pt will benefit from skilled therapeutic intervention in order to improve on the following deficits Pain;Decreased  activity tolerance   Rehab Potential Good   PT Frequency 3x / week   PT Duration 6 weeks   PT Treatment/Interventions ADLs/Self Care Home Management;Electrical Stimulation;Moist Heat;Ultrasound;Therapeutic exercise;Balance training;Neuromuscular re-education;Manual techniques;Traction   PT Next Visit Plan continue to add therex as tolerated and cont with other   Consulted and Agree with Plan of Care Patient        Problem List Patient Active Problem List   Diagnosis Date Noted  . Postoperative anemia due to acute blood loss 08/28/2013  . OA (osteoarthritis) of knee 04/27/2013  . 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 Vaginal atrophy 05/25/2012  . Osteopenia 05/24/2011  . CONSTIPATION 08/27/2009  . ULCERATION OF INTESTINE 08/27/2009    Sloane Junkin,CHRIS, PTA 04/08/2015, 12:20 PM  [facility] [address] [place], [place], [address] Phone: [phone]   Fax:  [phone]

## 2015-04-10 ENCOUNTER — Encounter: Payer: Self-pay | Admitting: *Deleted

## 2015-04-10 ENCOUNTER — Ambulatory Visit: Payer: Medicare Other | Admitting: *Deleted

## 2015-04-10 DIAGNOSIS — M542 Cervicalgia: Secondary | ICD-10-CM

## 2015-04-10 DIAGNOSIS — R269 Unspecified abnormalities of gait and mobility: Secondary | ICD-10-CM

## 2015-04-10 NOTE — Therapy (Signed)
[facility] [place], [place], [address] Phone: [phone]   Fax:  [phone]  Physical Therapy Treatment  Patient Details  Name: Maria Jackson MRN: [id number] Date of Birth: 12-10-1945 Referring Provider:  Marton Redwood, MD  Encounter Date: 04/10/2015      PT End of Session - 04/10/15 1254    Visit Number 18   Number of Visits 24   Date for PT Re-Evaluation 05/18/15   PT Start Time 1030   PT Stop Time 1129   PT Time Calculation (min) 59 min      Past Medical History  Diagnosis Date  . Allergy   . Arthritis     knees  . Osteopenia   . Raynaud disease   . Insomnia     NO LONGER AN ISSUE  . Spondylolisthesis     lumbar  . Cancer ([place]) 11/05/11    SQUAMOS CELL CARCINOMA - LEFT CALF  . Seasonal allergies   . Chronic lower back pain   . Concussion     Past Surgical History  Procedure Laterality Date  . Colonoscopy    . Tonsillectomy    . Cholecystectomy    . Appendectomy    . Vaginal hysterectomy      tah/bso  . Wisdom tooth extraction    . Foot neuroma surgery      bilateral  . Endoscopic vein laser treatment      calf riht and left  . Total knee arthroplasty Left 04/27/2013    Procedure: LEFT TOTAL KNEE ARTHROPLASTY;  Surgeon: Gearlean Alf, MD;  [facility];  Service: Orthopedics;  Laterality: Left;  . Joint replacement    . Total knee arthroplasty Right 08/27/2013    Procedure: RIGHT TOTAL KNEE ARTHROPLASTY;  Surgeon: Gearlean Alf, MD;  [facility];  Service: Orthopedics;  Laterality: Right;    There were no vitals filed for this visit.  Visit Diagnosis:  Neck pain  Abnormality of gait      Subjective Assessment - 04/10/15 1048    Subjective Was sore from new exs after last RX   Limitations Walking   Patient Stated Goals Walk better and eliminate neck pain.   Currently in Pain? Yes   Pain Score 2    Pain Location Neck   Pain Orientation Right   Pain Descriptors /  Indicators Aching;Tightness   Pain Type Acute pain                         [facility] Adult PT Treatment/Exercise - 04/10/15 0001    Exercises   Exercises Neck   Neck Exercises: Machines for Strengthening   UBE (Upper Arm Bike) UBE at 120 RPMs x 6 mins   Other Machines for Strengthening Elliptical x 6 mins L5,,R5   Other Machines for Strengthening Nustep L5, x 5 mins   Modalities   Modalities Electrical Stimulation;Moist Heat;Ultrasound   Moist Heat Therapy   Number Minutes Moist Heat 15 Minutes   Moist Heat Location Cervical   Electrical Stimulation   Electrical Stimulation Location right c-spine/UT premod 80-150hz  x15 min   Electrical Stimulation Goals Pain   Ultrasound   Ultrasound Location RT levator and UT   Ultrasound Parameters Combo x 10 mins, 1.5 wcm2   Ultrasound Goals Pain   Manual Therapy   Myofascial Release MFR/TPR to R Upper Trap, Levator Scapula, cervical paraspinals to decrese tightness and pain  PT Short Term Goals - 02/24/15 1126    PT SHORT TERM GOAL #1   Title Ind with initial HEP.   Time 2   Period Weeks   Status Achieved           PT Long Term Goals - 03/06/15 1031    PT LONG TERM GOAL #1   Title Negative Romberg test.   Period Weeks   Status Achieved   PT LONG TERM GOAL #2   Title Normal gait pattern without LOB.   Period Weeks   Status Achieved   PT LONG TERM GOAL #3   Title Bilateral active cervical rotation= 70 degrees so patient can turn her head more easliy while driving.   Time 6   Period Weeks   Status On-going   PT LONG TERM GOAL #4   Title Perform ADL's with pain not > 2/10.   Time 6   Period Weeks   Status On-going   PT LONG TERM GOAL #5   Title No c/o dizziness.   Time 6   Period Weeks   Status Achieved               Plan - 04/10/15 1255    Clinical Impression Statement Pt did well again today with increased ex time, manual STW and modalities. She has some increased  tightness and pain on RT side of cervical paras and levator scap. She is tolerating STW now and did better with TPR. Her Cervical ROM is about the same today RT 72 degrees and LT 62 degreesand goals are on -going   Pt will benefit from skilled therapeutic intervention in order to improve on the following deficits Pain;Decreased activity tolerance   Rehab Potential Good   PT Frequency 3x / week   PT Duration 6 weeks   PT Treatment/Interventions ADLs/Self Care Home Management;Electrical Stimulation;Moist Heat;Ultrasound;Therapeutic exercise;Balance training;Neuromuscular re-education;Manual techniques;Traction   PT Next Visit Plan continue to add therex as tolerated and cont with other   Consulted and Agree with Plan of Care Patient        Problem List Patient Active Problem List   Diagnosis Date Noted  . Postoperative anemia due to acute blood loss 08/28/2013  . OA (osteoarthritis) of knee 04/27/2013  . 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 Vaginal atrophy 05/25/2012  . Osteopenia 05/24/2011  . CONSTIPATION 08/27/2009  . ULCERATION OF INTESTINE 08/27/2009    Carol Theys,CHRIS, PTA 04/10/2015, 6:11 PM  [facility] [address] [place], [place], [address] Phone: [phone]   Fax:  [phone]

## 2015-04-15 ENCOUNTER — Encounter: Payer: Self-pay | Admitting: *Deleted

## 2015-04-15 ENCOUNTER — Ambulatory Visit: Payer: Medicare Other | Admitting: *Deleted

## 2015-04-15 DIAGNOSIS — M542 Cervicalgia: Secondary | ICD-10-CM

## 2015-04-15 DIAGNOSIS — R269 Unspecified abnormalities of gait and mobility: Secondary | ICD-10-CM

## 2015-04-15 NOTE — Therapy (Signed)
[facility] [place], [place], [address] Phone: [phone]   Fax:  [phone]  Physical Therapy Treatment  Patient Details  Name: Maria Jackson MRN: [id number] Date of Birth: [birth year] Referring Provider:  Marton Redwood, MD  Encounter Date: 04/15/2015      PT End of Session - 04/15/15 1247    Visit Number 19   Number of Visits 24   Date for PT Re-Evaluation 05/18/15   PT Start Time 1030   PT Stop Time 1129   PT Time Calculation (min) 59 min      Past Medical History  Diagnosis Date  . Allergy   . Arthritis     knees  . Osteopenia   . Raynaud disease   . Insomnia     NO LONGER AN ISSUE  . Spondylolisthesis     lumbar  . Cancer ([place]) 11/05/11    SQUAMOS CELL CARCINOMA - LEFT CALF  . Seasonal allergies   . Chronic lower back pain   . Concussion     Past Surgical History  Procedure Laterality Date  . Colonoscopy    . Tonsillectomy    . Cholecystectomy    . Appendectomy    . Vaginal hysterectomy      tah/bso  . Wisdom tooth extraction    . Foot neuroma surgery      bilateral  . Endoscopic vein laser treatment      calf riht and left  . Total knee arthroplasty Left 04/27/2013    Procedure: LEFT TOTAL KNEE ARTHROPLASTY;  Surgeon: Gearlean Alf, MD;  [facility]: [facility];  Service: Orthopedics;  Laterality: Left;  . Joint replacement    . Total knee arthroplasty Right 08/27/2013    Procedure: RIGHT TOTAL KNEE ARTHROPLASTY;  Surgeon: Gearlean Alf, MD;  [facility];  Service: Orthopedics;  Laterality: Right;    There were no vitals filed for this visit.  Visit Diagnosis:  Neck pain  Abnormality of gait      Subjective Assessment - 04/15/15 1046    Subjective Was sore from new exs after last RX. Mild burning sometimes in muscles   Limitations Walking   Patient Stated Goals Walk better and eliminate neck pain.   Currently in Pain? Yes   Pain Score 2    Pain Location Neck   Pain  Orientation Right   Pain Descriptors / Indicators Aching;Tightness   Pain Type Acute pain   Pain Onset More than a month ago   Aggravating Factors  ADLs   Pain Relieving Factors rest                         [facility] Adult PT Treatment/Exercise - 04/15/15 0001    Neck Exercises: Machines for Strengthening   UBE (Upper Arm Bike) UBE at 120 RPMs x 5 mins   Other Machines for Strengthening Elliptical x 7 mins L5,,R5   Other Machines for Strengthening Nustep L6, x 7 mins   Moist Heat Therapy   Number Minutes Moist Heat 15 Minutes   Moist Heat Location Cervical   Electrical Stimulation   Electrical Stimulation Location right c-spine/UT premod 80-150hz  x15 min   Electrical Stimulation Goals Pain   Ultrasound   Ultrasound Location RT levator and UT   Ultrasound Parameters Combo x 10 mins at 1.5 w/cm2   Ultrasound Goals Pain   Manual Therapy   Myofascial Release MFR/TPR to R Upper Trap, Levator Scapula, cervical paraspinals to decrese  tightness and pain                  PT Short Term Goals - 02/24/15 1126    PT SHORT TERM GOAL #1   Title Ind with initial HEP.   Time 2   Period Weeks   Status Achieved           PT Long Term Goals - 03/06/15 1031    PT LONG TERM GOAL #1   Title Negative Romberg test.   Period Weeks   Status Achieved   PT LONG TERM GOAL #2   Title Normal gait pattern without LOB.   Period Weeks   Status Achieved   PT LONG TERM GOAL #3   Title Bilateral active cervical rotation= 70 degrees so patient can turn her head more easliy while driving.   Time 6   Period Weeks   Status On-going   PT LONG TERM GOAL #4   Title Perform ADL's with pain not > 2/10.   Time 6   Period Weeks   Status On-going   PT LONG TERM GOAL #5   Title No c/o dizziness.   Time 6   Period Weeks   Status Achieved               Plan - 04/15/15 1248    Clinical Impression Statement Pt did well again with exs and was able to perform about 19 minutes  of ex and she is able to do more of her ADLs at home. She still has discomfort and notable tightness in RT UT and levator as well as cervical paras. Her ROM continues to improve, but unable to meet goal for LT rotation due to tightness.   Pt will benefit from skilled therapeutic intervention in order to improve on the following deficits Pain;Decreased activity tolerance   Rehab Potential Good   PT Frequency 3x / week   PT Duration 6 weeks   PT Treatment/Interventions ADLs/Self Care Home Management;Electrical Stimulation;Moist Heat;Ultrasound;Therapeutic exercise;Balance training;Neuromuscular re-education;Manual techniques;Traction   PT Next Visit Plan continue to add therex as tolerated and cont with other   Consulted and Agree with Plan of Care Patient        Problem List Patient Active Problem List   Diagnosis Date Noted  . Postoperative anemia due to acute blood loss 08/28/2013  . OA (osteoarthritis) of knee 04/27/2013  . 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 Vaginal atrophy 05/25/2012  . Osteopenia 05/24/2011  . CONSTIPATION 08/27/2009  . ULCERATION OF INTESTINE 08/27/2009    RAMSEUR,CHRIS, PTA 04/15/2015, 12:58 PM  [facility] [address] [place], [place], [address] Phone: [phone]   Fax:  [phone]

## 2015-04-17 ENCOUNTER — Encounter: Payer: Self-pay | Admitting: *Deleted

## 2015-04-17 ENCOUNTER — Ambulatory Visit: Payer: Medicare Other | Admitting: *Deleted

## 2015-04-17 DIAGNOSIS — M542 Cervicalgia: Secondary | ICD-10-CM | POA: Diagnosis not present

## 2015-04-17 DIAGNOSIS — R269 Unspecified abnormalities of gait and mobility: Secondary | ICD-10-CM

## 2015-04-17 NOTE — Therapy (Signed)
[facility] [place], [place], [address] Phone: [phone]   Fax:  [phone]  Physical Therapy Treatment  Patient Details  Name: Maria Jackson MRN: [id number] Date of Birth: [birth year] Referring Provider:  Marton Redwood, MD  Encounter Date: 04/17/2015      PT End of Session - 04/17/15 1301    Visit Number 20   Number of Visits 24   Date for PT Re-Evaluation 05/18/15   PT Start Time 1030   PT Stop Time 1129   PT Time Calculation (min) 59 min      Past Medical History  Diagnosis Date  . Allergy   . Arthritis     knees  . Osteopenia   . Raynaud disease   . Insomnia     NO LONGER AN ISSUE  . Spondylolisthesis     lumbar  . Cancer ([place]) 11/05/11    SQUAMOS CELL CARCINOMA - LEFT CALF  . Seasonal allergies   . Chronic lower back pain   . Concussion     Past Surgical History  Procedure Laterality Date  . Colonoscopy    . Tonsillectomy    . Cholecystectomy    . Appendectomy    . Vaginal hysterectomy      tah/bso  . Wisdom tooth extraction    . Foot neuroma surgery      bilateral  . Endoscopic vein laser treatment      calf riht and left  . Total knee arthroplasty Left 04/27/2013    Procedure: LEFT TOTAL KNEE ARTHROPLASTY;  Surgeon: Gearlean Alf, MD;  [facility]: [facility];  Service: Orthopedics;  Laterality: Left;  . Joint replacement    . Total knee arthroplasty Right 08/27/2013    Procedure: RIGHT TOTAL KNEE ARTHROPLASTY;  Surgeon: Gearlean Alf, MD;  [facility]: [facility];  Service: Orthopedics;  Laterality: Right;    There were no vitals filed for this visit.  Visit Diagnosis:  Neck pain  Abnormality of gait      Subjective Assessment - 04/17/15 1052    Subjective Aching some after last Rx. Mild burning sensations at times. Bending and turning getting easier   Limitations Walking   Patient Stated Goals Walk better and eliminate neck pain.   Currently in Pain? Yes   Pain Score 2    Pain  Location Neck   Pain Orientation Right   Pain Descriptors / Indicators Aching;Tightness   Pain Type Acute pain   Pain Onset More than a month ago   Pain Frequency Intermittent   Aggravating Factors  ADLs   Pain Relieving Factors rest                         [facility] Adult PT Treatment/Exercise - 04/17/15 0001    Exercises   Exercises Neck   Neck Exercises: Machines for Strengthening   UBE (Upper Arm Bike) UBE at 120 RPMs x 65mins   Other Machines for Strengthening Elliptical x 7 mins L5,,R5   Other Machines for Strengthening Nustep L6, x 36mins   Modalities   Modalities Electrical Stimulation;Moist Heat;Ultrasound   Moist Heat Therapy   Number Minutes Moist Heat 15 Minutes   Moist Heat Location Cervical   Electrical Stimulation   Electrical Stimulation Location right c-spine/UT premod 80-150hz  x15 min   Electrical Stimulation Goals Pain   Ultrasound   Ultrasound Location RT levator and UT   Ultrasound Parameters Combo x 10 mins 1.5 w/cm2   Ultrasound Goals  Pain   Manual Therapy   Myofascial Release MFR/TPR to R Upper Trap, Levator Scapula, cervical paraspinals to decrese tightness and pain                  PT Short Term Goals - 02/24/15 1126    PT SHORT TERM GOAL #1   Title Ind with initial HEP.   Time 2   Period Weeks   Status Achieved           PT Long Term Goals - 03/06/15 1031    PT LONG TERM GOAL #1   Title Negative Romberg test.   Period Weeks   Status Achieved   PT LONG TERM GOAL #2   Title Normal gait pattern without LOB.   Period Weeks   Status Achieved   PT LONG TERM GOAL #3   Title Bilateral active cervical rotation= 70 degrees so patient can turn her head more easliy while driving.   Time 6   Period Weeks   Status On-going   PT LONG TERM GOAL #4   Title Perform ADL's with pain not > 2/10.   Time 6   Period Weeks   Status On-going   PT LONG TERM GOAL #5   Title No c/o dizziness.   Time 6   Period Weeks   Status  Achieved               Plan - 04/17/15 1302    Clinical Impression Statement Pt did good today with Rx and had increasedCerv. rotation Rom LT 68 degrees and RT 75 degrees with end-range tightness. She was able perform 21 minutes of ex today without increased pain. Goals are ongoing. FOTO down to 42%   Pt will benefit from skilled therapeutic intervention in order to improve on the following deficits Pain;Decreased activity tolerance   Rehab Potential Good   PT Frequency 3x / week   PT Duration 6 weeks   PT Treatment/Interventions ADLs/Self Care Home Management;Electrical Stimulation;Moist Heat;Ultrasound;Therapeutic exercise;Balance training;Neuromuscular re-education;Manual techniques;Traction   PT Next Visit Plan continue to add therex as tolerated and cont with other   Consulted and Agree with Plan of Care Patient        Problem List Patient Active Problem List   Diagnosis Date Noted  . Postoperative anemia due to acute blood loss 08/28/2013  . OA (osteoarthritis) of knee 04/27/2013  . 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 Vaginal atrophy 05/25/2012  . Osteopenia 05/24/2011  . CONSTIPATION 08/27/2009  . ULCERATION OF INTESTINE 08/27/2009    Elizabet Schweppe,CHRIS 04/17/2015, 1:45 PM  [facility] [address] [place], [place], [address] Phone: [phone]   Fax:  [phone]

## 2015-04-22 ENCOUNTER — Ambulatory Visit: Payer: Medicare Other | Admitting: *Deleted

## 2015-04-22 ENCOUNTER — Encounter: Payer: Self-pay | Admitting: *Deleted

## 2015-04-22 DIAGNOSIS — R269 Unspecified abnormalities of gait and mobility: Secondary | ICD-10-CM

## 2015-04-22 DIAGNOSIS — M542 Cervicalgia: Secondary | ICD-10-CM

## 2015-04-22 NOTE — Therapy (Signed)
[facility] [place], [place], [address] Phone: [phone]   Fax:  [phone]  Physical Therapy Treatment  Patient Details  Name: Maria Jackson MRN: [id number] Date of Birth: Oct 12, 1945 No Data Recorded  Encounter Date: 04/22/2015      PT End of Session - 04/22/15 1252    Visit Number 21   Number of Visits 24   Date for PT Re-Evaluation 05/18/15   PT Start Time 1030   PT Stop Time 1130   PT Time Calculation (min) 60 min   Activity Tolerance Patient tolerated treatment well   Behavior During Therapy [facility] for tasks assessed/performed      Past Medical History  Diagnosis Date  . Allergy   . Arthritis     knees  . Osteopenia   . Raynaud disease   . Insomnia     NO LONGER AN ISSUE  . Spondylolisthesis     lumbar  . Cancer ([place]) 11/05/11    SQUAMOS CELL CARCINOMA - LEFT CALF  . Seasonal allergies   . Chronic lower back pain   . Concussion     Past Surgical History  Procedure Laterality Date  . Colonoscopy    . Tonsillectomy    . Cholecystectomy    . Appendectomy    . Vaginal hysterectomy      tah/bso  . Wisdom tooth extraction    . Foot neuroma surgery      bilateral  . Endoscopic vein laser treatment      calf riht and left  . Total knee arthroplasty Left 04/27/2013    Procedure: LEFT TOTAL KNEE ARTHROPLASTY;  Surgeon: Gearlean Alf, MD;  [facility]: [facility];  Service: Orthopedics;  Laterality: Left;  . Joint replacement    . Total knee arthroplasty Right 08/27/2013    Procedure: RIGHT TOTAL KNEE ARTHROPLASTY;  Surgeon: Gearlean Alf, MD;  [facility]: [facility];  Service: Orthopedics;  Laterality: Right;    There were no vitals filed for this visit.  Visit Diagnosis:  Neck pain  Abnormality of gait      Subjective Assessment - 04/22/15 1059    Subjective Doing better overall still.Played the piano for about 50 mins and could feel RT side tighten up   Limitations Walking   Patient Stated Goals  Walk better and eliminate neck pain.   Currently in Pain? Yes   Pain Score 2    Pain Location Neck   Pain Orientation Right   Pain Descriptors / Indicators Aching;Tightness   Pain Type Acute pain   Pain Onset More than a month ago   Pain Frequency Intermittent   Aggravating Factors  ADLs                                   PT Short Term Goals - 02/24/15 1126    PT SHORT TERM GOAL #1   Title Ind with initial HEP.   Time 2   Period Weeks   Status Achieved           PT Long Term Goals - 03/06/15 1031    PT LONG TERM GOAL #1   Title Negative Romberg test.   Period Weeks   Status Achieved   PT LONG TERM GOAL #2   Title Normal gait pattern without LOB.   Period Weeks   Status Achieved   PT LONG TERM GOAL #3   Title Bilateral active cervical  rotation= 70 degrees so patient can turn her head more easliy while driving.   Time 6   Period Weeks   Status On-going   PT LONG TERM GOAL #4   Title Perform ADL's with pain not > 2/10.   Time 6   Period Weeks   Status On-going   PT LONG TERM GOAL #5   Title No c/o dizziness.   Time 6   Period Weeks   Status Achieved               Plan - 04/22/15 1253    Clinical Impression Statement Pt did good with Rx today and was able to perform about 20 mins of ex. with only mild tension after. She is still tight in levator and UT on RT side. She was able to do more at home the last few days and did well.   Pt will benefit from skilled therapeutic intervention in order to improve on the following deficits Pain;Decreased activity tolerance   Rehab Potential Good   PT Frequency 3x / week   PT Duration 6 weeks   PT Treatment/Interventions ADLs/Self Care Home Management;Electrical Stimulation;Moist Heat;Ultrasound;Therapeutic exercise;Balance training;Neuromuscular re-education;Manual techniques;Traction   PT Next Visit Plan continue to add therex as tolerated and cont with other   Consulted and Agree with Plan  of Care Patient        Problem List Patient Active Problem List   Diagnosis Date Noted  . Postoperative anemia due to acute blood loss 08/28/2013  . OA (osteoarthritis) of knee 04/27/2013  . 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 Vaginal atrophy 05/25/2012  . Osteopenia 05/24/2011  . CONSTIPATION 08/27/2009  . ULCERATION OF INTESTINE 08/27/2009    Kalanie Fewell,CHRIS, PTA 04/22/2015, 6:31 PM  [facility] [place], [place], [address] Phone: [phone]   Fax:  [phone]  Name: Maria Jackson MRN: [id number] Date of Birth: 1946/01/26

## 2015-04-24 ENCOUNTER — Ambulatory Visit: Payer: Medicare Other | Admitting: *Deleted

## 2015-04-24 DIAGNOSIS — M542 Cervicalgia: Secondary | ICD-10-CM | POA: Diagnosis not present

## 2015-04-24 DIAGNOSIS — R269 Unspecified abnormalities of gait and mobility: Secondary | ICD-10-CM

## 2015-04-24 NOTE — Therapy (Signed)
[facility] [place], [place], [address] Phone: [phone]   Fax:  [phone]  Physical Therapy Treatment  Patient Details  Name: Maria Jackson MRN: [id number] Date of Birth: 1945/10/06 No Data Recorded  Encounter Date: 04/24/2015      PT End of Session - 04/24/15 1034    Visit Number 22   Number of Visits 24   Date for PT Re-Evaluation 05/18/15   PT Start Time 1030   PT Stop Time 1130   PT Time Calculation (min) 60 min      Past Medical History  Diagnosis Date  . Allergy   . Arthritis     knees  . Osteopenia   . Raynaud disease   . Insomnia     NO LONGER AN ISSUE  . Spondylolisthesis     lumbar  . Cancer ([place]) 11/05/11    SQUAMOS CELL CARCINOMA - LEFT CALF  . Seasonal allergies   . Chronic lower back pain   . Concussion     Past Surgical History  Procedure Laterality Date  . Colonoscopy    . Tonsillectomy    . Cholecystectomy    . Appendectomy    . Vaginal hysterectomy      tah/bso  . Wisdom tooth extraction    . Foot neuroma surgery      bilateral  . Endoscopic vein laser treatment      calf riht and left  . Total knee arthroplasty Left 04/27/2013    Procedure: LEFT TOTAL KNEE ARTHROPLASTY;  Surgeon: Gearlean Alf, MD;  Location: [facility];  Service: Orthopedics;  Laterality: Left;  . Joint replacement    . Total knee arthroplasty Right 08/27/2013    Procedure: RIGHT TOTAL KNEE ARTHROPLASTY;  Surgeon: Gearlean Alf, MD;  Location: [facility];  Service: Orthopedics;  Laterality: Right;    There were no vitals filed for this visit.  Visit Diagnosis:  Neck pain  Abnormality of gait      Subjective Assessment - 04/24/15 1055    Subjective Still doing fairly well. Still have discomfort in RT side of neck,UT AFTER ACT.S   Limitations Walking   Patient Stated Goals Walk better and eliminate neck pain.   Currently in Pain? Yes   Pain Score 2    Pain Location Neck   Pain Orientation Right   Pain Descriptors / Indicators Aching;Tightness   Pain Type Acute pain   Pain Onset More than a month ago                         [facility] Adult PT Treatment/Exercise - 04/24/15 0001    Exercises   Exercises Neck   Neck Exercises: Machines for Strengthening   UBE (Upper Arm Bike) UBE at 90 RPMs x 73mins   Other Machines for Strengthening Elliptical x 15 mins L5,,R5   Moist Heat Therapy   Number Minutes Moist Heat 15 Minutes   Moist Heat Location Cervical   Electrical Stimulation   Electrical Stimulation Location right c-spine/UT premod 80-150hz  x15 min   Electrical Stimulation Goals Pain   Ultrasound   Ultrasound Location RT levator and UT   Ultrasound Parameters Combo x 10 mins 1.5 w/cm2 x 10 mins in sitting   Ultrasound Goals Pain   Manual Therapy   Myofascial Release MFR/TPR to R Upper Trap, Levator Scapula, cervical paraspinals to decrese tightness and pain  PT Short Term Goals - 02/24/15 1126    PT SHORT TERM GOAL #1   Title Ind with initial HEP.   Time 2   Period Weeks   Status Achieved           PT Long Term Goals - 05-21-2015 1304    PT LONG TERM GOAL #1   Title Negative Romberg test.   Time 6   Period Weeks   Status Achieved   PT LONG TERM GOAL #2   Title Normal gait pattern without LOB.   Time 6   Period Weeks   Status Achieved   PT LONG TERM GOAL #3   Title Bilateral active cervical rotation= 70 degrees so patient can turn her head more easliy while driving.   Time 6   Period Weeks   Status Achieved   PT LONG TERM GOAL #4   Title Perform ADL's with pain not > 2/10.   Time 6   Period Weeks   Status On-going   PT LONG TERM GOAL #5   Title No c/o dizziness.   Period Weeks   Status Achieved               Plan - 2015-05-21 1035    Clinical Impression Statement Pt did very well today and was able to perform all exs with only mild RT side irritation. She still has some notable tension in RT side UT,  levator and Cervical paras. Her ROM was good for cervical rotation 70 degrees each way.   Pt will benefit from skilled therapeutic intervention in order to improve on the following deficits Pain;Decreased activity tolerance   Rehab Potential Good   PT Frequency 3x / week   PT Duration 6 weeks   PT Treatment/Interventions ADLs/Self Care Home Management;Electrical Stimulation;Moist Heat;Ultrasound;Therapeutic exercise;Balance training;Neuromuscular re-education;Manual techniques;Traction   PT Next Visit Plan continue to add therex as tolerated and cont with other   Consulted and Agree with Plan of Care Patient          G-Codes - 2015-05-21 1319    Functional Assessment Tool Used FOTO on 04-17-15 20th visit 42% limited      Problem List Patient Active Problem List   Diagnosis Date Noted  . Postoperative anemia due to acute blood loss 08/28/2013  . OA (osteoarthritis) of knee 04/27/2013  . 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 Vaginal atrophy 05/25/2012  . Osteopenia 05/24/2011  . CONSTIPATION 08/27/2009  . ULCERATION OF INTESTINE 08/27/2009    Nikia Mangino,CHRIS, PTA 21-May-2015, 1:30 PM  [facility] [address] [place], [place], [address] Phone: [phone]   Fax:  [phone]  Name: KILEY SOLIMINE MRN: [id number] Date of Birth: Jun 02, 1946

## 2015-04-29 ENCOUNTER — Ambulatory Visit: Payer: Medicare Other | Admitting: *Deleted

## 2015-04-29 ENCOUNTER — Encounter: Payer: Self-pay | Admitting: *Deleted

## 2015-04-29 DIAGNOSIS — M542 Cervicalgia: Secondary | ICD-10-CM

## 2015-04-29 DIAGNOSIS — R269 Unspecified abnormalities of gait and mobility: Secondary | ICD-10-CM

## 2015-04-29 NOTE — Therapy (Signed)
[facility] [place], [place], [address] Phone: [phone]   Fax:  [phone]  Physical Therapy Treatment  Patient Details  Name: Maria Jackson MRN: [id number] Date of Birth: Nov 15, 1945 No Data Recorded  Encounter Date: 04/29/2015      PT End of Session - 04/29/15 1247    Visit Number 23   Number of Visits 24   Date for PT Re-Evaluation 05/18/15   PT Start Time 1115   PT Stop Time 1214   PT Time Calculation (min) 59 min      Past Medical History  Diagnosis Date  . Allergy   . Arthritis     knees  . Osteopenia   . Raynaud disease   . Insomnia     NO LONGER AN ISSUE  . Spondylolisthesis     lumbar  . Cancer ([place]) 11/05/11    SQUAMOS CELL CARCINOMA - LEFT CALF  . Seasonal allergies   . Chronic lower back pain   . Concussion     Past Surgical History  Procedure Laterality Date  . Colonoscopy    . Tonsillectomy    . Cholecystectomy    . Appendectomy    . Vaginal hysterectomy      tah/bso  . Wisdom tooth extraction    . Foot neuroma surgery      bilateral  . Endoscopic vein laser treatment      calf riht and left  . Total knee arthroplasty Left 04/27/2013    Procedure: LEFT TOTAL KNEE ARTHROPLASTY;  Surgeon: Gearlean Alf, MD;  Location: [facility];  Service: Orthopedics;  Laterality: Left;  . Joint replacement    . Total knee arthroplasty Right 08/27/2013    Procedure: RIGHT TOTAL KNEE ARTHROPLASTY;  Surgeon: Gearlean Alf, MD;  Location: [facility];  Service: Orthopedics;  Laterality: Right;    There were no vitals filed for this visit.  Visit Diagnosis:  Neck pain  Abnormality of gait      Subjective Assessment - 04/29/15 1242    Subjective Still doing fairly well. Still have discomfort in RT side of neck,UT AFTER ACT.S   Patient Stated Goals Walk better and eliminate neck pain.   Currently in Pain? Yes   Pain Score 2    Pain Location Neck   Pain Orientation Right   Pain Descriptors /  Indicators Aching;Tightness   Pain Type Acute pain   Pain Onset More than a month ago   Pain Frequency Intermittent   Aggravating Factors  ADLs   Pain Relieving Factors rest                         [facility] Adult PT Treatment/Exercise - 04/29/15 0001    Exercises   Exercises Neck   Neck Exercises: Machines for Strengthening   Other Machines for Strengthening Elliptical x 15 mins L5,,R5   Other Machines for Strengthening Nustep L6, x 45mins   Modalities   Modalities Electrical Stimulation;Moist Heat;Ultrasound   Moist Heat Therapy   Number Minutes Moist Heat 15 Minutes   Moist Heat Location Cervical   Electrical Stimulation   Electrical Stimulation Location right c-spine/UT premod 80-150hz  x15 min   Electrical Stimulation Goals Pain   Ultrasound   Ultrasound Location RT Levator and UT   Ultrasound Parameters Combo x 10 mins 1.5 w/cm2   Ultrasound Goals Pain   Manual Therapy   Myofascial Release MFR/TPR to R Upper Trap, Levator Scapula, cervical paraspinals to decrese  tightness and pain                  PT Short Term Goals - 02/24/15 1126    PT SHORT TERM GOAL #1   Title Ind with initial HEP.   Time 2   Period Weeks   Status Achieved           PT Long Term Goals - 04/24/15 1304    PT LONG TERM GOAL #1   Title Negative Romberg test.   Time 6   Period Weeks   Status Achieved   PT LONG TERM GOAL #2   Title Normal gait pattern without LOB.   Time 6   Period Weeks   Status Achieved   PT LONG TERM GOAL #3   Title Bilateral active cervical rotation= 70 degrees so patient can turn her head more easliy while driving.   Time 6   Period Weeks   Status Achieved   PT LONG TERM GOAL #4   Title Perform ADL's with pain not > 2/10.   Time 6   Period Weeks   Status On-going   PT LONG TERM GOAL #5   Title No c/o dizziness.   Period Weeks   Status Achieved               Plan - 04/29/15 1249    Clinical Impression Statement Pt did great  today with exs and the rest of Rx. She continues to has some discomfort on her RT side after exs and ADL's. She has improved overall, but continues with some residual discomfort.   Pt will benefit from skilled therapeutic intervention in order to improve on the following deficits Pain;Decreased activity tolerance   Rehab Potential Good   PT Frequency 3x / week   PT Duration 6 weeks   PT Treatment/Interventions ADLs/Self Care Home Management;Electrical Stimulation;Moist Heat;Ultrasound;Therapeutic exercise;Balance training;Neuromuscular re-education;Manual techniques;Traction   PT Next Visit Plan 1 visit left   Consulted and Agree with Plan of Care Patient        Problem List Patient Active Problem List   Diagnosis Date Noted  . Postoperative anemia due to acute blood loss 08/28/2013  . OA (osteoarthritis) of knee 04/27/2013  . 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 Vaginal atrophy 05/25/2012  . Osteopenia 05/24/2011  . CONSTIPATION 08/27/2009  . ULCERATION OF INTESTINE 08/27/2009    Ramya Vanbergen,CHRIS, PTA 04/29/2015, 1:00 PM  [facility] [place], [place], [address] Phone: [phone]   Fax:  [phone]  Name: Maria Jackson MRN: [id number] Date of Birth: Dec 08, 1945

## 2015-05-01 ENCOUNTER — Encounter: Payer: Self-pay | Admitting: *Deleted

## 2015-05-01 ENCOUNTER — Ambulatory Visit: Payer: Medicare Other | Admitting: *Deleted

## 2015-05-01 DIAGNOSIS — M542 Cervicalgia: Secondary | ICD-10-CM | POA: Diagnosis not present

## 2015-05-01 DIAGNOSIS — R269 Unspecified abnormalities of gait and mobility: Secondary | ICD-10-CM

## 2015-05-01 NOTE — Therapy (Signed)
[facility] [place], [place], [address] Phone: [phone]   Fax:  [phone]  Physical Therapy Treatment  Patient Details  Name: Maria Jackson MRN: [id number] Date of Birth: August 17, 1945 No Data Recorded  Encounter Date: 05/01/2015      PT End of Session - 05/01/15 1049    Visit Number 24   Number of Visits 24   Date for PT Re-Evaluation 05/18/15   PT Start Time 1030   PT Stop Time 1130   PT Time Calculation (min) 60 min      Past Medical History  Diagnosis Date  . Allergy   . Arthritis     knees  . Osteopenia   . Raynaud disease   . Insomnia     NO LONGER AN ISSUE  . Spondylolisthesis     lumbar  . Cancer ([place]) 11/05/11    SQUAMOS CELL CARCINOMA - LEFT CALF  . Seasonal allergies   . Chronic lower back pain   . Concussion     Past Surgical History  Procedure Laterality Date  . Colonoscopy    . Tonsillectomy    . Cholecystectomy    . Appendectomy    . Vaginal hysterectomy      tah/bso  . Wisdom tooth extraction    . Foot neuroma surgery      bilateral  . Endoscopic vein laser treatment      calf riht and left  . Total knee arthroplasty Left 04/27/2013    Procedure: LEFT TOTAL KNEE ARTHROPLASTY;  Surgeon: Gearlean Alf, MD;  [facility]: [facility];  Service: Orthopedics;  Laterality: Left;  . Joint replacement    . Total knee arthroplasty Right 08/27/2013    Procedure: RIGHT TOTAL KNEE ARTHROPLASTY;  Surgeon: Gearlean Alf, MD;  [facility]: [facility];  Service: Orthopedics;  Laterality: Right;    There were no vitals filed for this visit.  Visit Diagnosis:  Neck pain  Abnormality of gait          [facility] PT Assessment - 05/01/15 0001    AROM   Overall AROM  Deficits   Overall AROM Comments right 70 degrees, left 70 degrees                     [facility] Adult PT Treatment/Exercise - 05/01/15 0001    Exercises   Exercises Neck   Neck Exercises: Machines for Strengthening   UBE  (Upper Arm Bike) UBE at 90 RPMs x 86mns   Other Machines for Strengthening Elliptical x 15 mins L5,,R5   Modalities   Modalities Electrical Stimulation;Moist Heat;Ultrasound   Moist Heat Therapy   Number Minutes Moist Heat 15 Minutes   Moist Heat Location Cervical   Electrical Stimulation   Electrical Stimulation Location right c-spine/UT premod 80-150hz x15 min, 5 secs on/OFF   Electrical Stimulation Goals Pain   Ultrasound   Ultrasound Location RT Levator   Ultrasound Parameters Combo x10 mins 1.5 w/cm2   Ultrasound Goals Pain   Manual Therapy   Manual Therapy Myofascial release;Manual Traction   Myofascial Release MFR/TPR to R Upper Trap, Levator Scapula, cervical paraspinals to decrese tightness and pain                  PT Short Term Goals - 05/01/15 1054    PT SHORT TERM GOAL #1   Title (p) Ind with initial HEP.   Time (p) 2   Period (p) Weeks   Status (p) Achieved  PT Long Term Goals - 2015-05-29 1132    PT LONG TERM GOAL #1   Title Negative Romberg test.   Period Weeks   Status Achieved   PT LONG TERM GOAL #2   Title Normal gait pattern without LOB.   Time 6   Period Weeks   Status Achieved   PT LONG TERM GOAL #3   Title Bilateral active cervical rotation= 70 degrees so patient can turn her head more easliy while driving.   Time 6   Period Weeks   Status Achieved   PT LONG TERM GOAL #4   Title Perform ADL's with pain not > 2/10.   Period Weeks   Status Not Met   PT LONG TERM GOAL #5   Title No c/o dizziness.   Time 6   Period Weeks   Status Achieved               Plan - 05-29-15 1052    Clinical Impression Statement Pt did great today with exs and manual STW. She has met all goals except LTG for 2/10 pain with ADL's. Her pain can rise to 3-4/10 at times after or during ADLs. She still has notable tightness in RT Levator scapula at origin.   Pt will benefit from skilled therapeutic intervention in order to improve on the  following deficits Pain;Decreased activity tolerance   Rehab Potential Good   PT Frequency 3x / week   PT Duration 6 weeks   PT Next Visit Plan DC to HEP and [place] and Agree with Plan of Care Patient          G-Codes - May 29, 2015 1309    Functional Assessment Tool Used 24th visit DC FOTO Gcode 34% limited      Problem List Patient Active Problem List   Diagnosis Date Noted  . Postoperative anemia due to acute blood loss 08/28/2013  . OA (osteoarthritis) of knee 04/27/2013  . 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 Vaginal atrophy 05/25/2012  . Osteopenia 05/24/2011  . CONSTIPATION 08/27/2009  . ULCERATION OF INTESTINE 08/27/2009    Maria Jackson,CHRIS, PTA 2015/05/29, 1:12 PM  [facility] [place], [place], [address] Phone: [phone]   Fax:  [phone]  Name: Maria Jackson MRN: [id number] Date of Birth: 02/24/1946

## 2015-05-05 DIAGNOSIS — M542 Cervicalgia: Secondary | ICD-10-CM | POA: Diagnosis not present

## 2015-05-05 NOTE — Therapy (Signed)
[facility] [place], [place], [address] Phone: [phone]   Fax:  [phone]  Physical Therapy Treatment  Patient Details  Name: Maria Jackson MRN: [id number] Date of Birth: [birth year] No Data Recorded  Encounter Date: 05/01/2015    Past Medical History  Diagnosis Date  . Allergy   . Arthritis     knees  . Osteopenia   . Raynaud disease   . Insomnia     NO LONGER AN ISSUE  . Spondylolisthesis     lumbar  . Cancer ([place]) 11/05/11    SQUAMOS CELL CARCINOMA - LEFT CALF  . Seasonal allergies   . Chronic lower back pain   . Concussion     Past Surgical History  Procedure Laterality Date  . Colonoscopy    . Tonsillectomy    . Cholecystectomy    . Appendectomy    . Vaginal hysterectomy      tah/bso  . Wisdom tooth extraction    . Foot neuroma surgery      bilateral  . Endoscopic vein laser treatment      calf riht and left  . Total knee arthroplasty Left 04/27/2013    Procedure: LEFT TOTAL KNEE ARTHROPLASTY;  Surgeon: Gearlean Alf, MD;  [facility]: [facility];  Service: Orthopedics;  Laterality: Left;  . Joint replacement    . Total knee arthroplasty Right 08/27/2013    Procedure: RIGHT TOTAL KNEE ARTHROPLASTY;  Surgeon: Gearlean Alf, MD;  [facility]: [facility];  Service: Orthopedics;  Laterality: Right;    There were no vitals filed for this visit.  Visit Diagnosis:  Neck pain  Abnormality of gait                                 PT Short Term Goals - 05/01/15 1054    PT SHORT TERM GOAL #1   Title (p) Ind with initial HEP.   Time (p) 2   Period (p) Weeks   Status (p) Achieved           PT Long Term Goals - 05/01/15 1132    PT LONG TERM GOAL #1   Title Negative Romberg test.   Period Weeks   Status Achieved   PT LONG TERM GOAL #2   Title Normal gait pattern without LOB.   Time 6   Period Weeks   Status Achieved   PT LONG TERM GOAL #3   Title Bilateral active  cervical rotation= 70 degrees so patient can turn her head more easliy while driving.   Time 6   Period Weeks   Status Achieved   PT LONG TERM GOAL #4   Title Perform ADL's with pain not > 2/10.   Period Weeks   Status Not Met   PT LONG TERM GOAL #5   Title No c/o dizziness.   Time 6   Period Weeks   Status Achieved                 G-Codes - 05-31-15 1621    Functional Assessment Tool Used 24th visit DC FOTO Gcode 34% limited   Functional Limitation Mobility: Walking and moving around   Mobility: Walking and Moving Around Current Status [phone]) At least 20 percent but less than 40 percent impaired, limited or restricted   Mobility: Walking and Moving Around Goal Status ([id number]) At least 1 percent but less than 20 percent impaired, limited or restricted  Mobility: Walking and Moving Around Discharge Status [phone]) At least 20 percent but less than 40 percent impaired, limited or restricted      Problem List Patient Active Problem List   Diagnosis Date Noted  . Postoperative anemia due to acute blood loss 08/28/2013  . OA (osteoarthritis) of knee 04/27/2013  . 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 Vaginal atrophy 05/25/2012  . Osteopenia 05/24/2011  . CONSTIPATION 08/27/2009  . ULCERATION OF INTESTINE 08/27/2009   PHYSICAL THERAPY DISCHARGE SUMMARY  Visits from Start of Care:   Current functional level related to goals / functional outcomes: All goals met.   Remaining deficits: All goals met.   Education / Equipment: HEP. Plan: Patient agrees to discharge.  Patient goals were not met. Patient is being discharged due to meeting the stated rehab goals.  ?????      [place], [place] MPT 05/05/2015, 4:22 PM  [facility] [address] [place], [place], [address] Phone: [phone]   Fax:  [phone]  Name: Maria Jackson MRN: [id number] Date of Birth: 11-21-1945

## 2015-05-15 ENCOUNTER — Ambulatory Visit (INDEPENDENT_AMBULATORY_CARE_PROVIDER_SITE_OTHER): Payer: Medicare Other | Admitting: Neurology

## 2015-05-15 ENCOUNTER — Encounter: Payer: Self-pay | Admitting: Neurology

## 2015-05-15 VITALS — Ht 62.5 in | Wt 151.0 lb

## 2015-05-15 DIAGNOSIS — R519 Headache, unspecified: Secondary | ICD-10-CM | POA: Insufficient documentation

## 2015-05-15 DIAGNOSIS — M542 Cervicalgia: Secondary | ICD-10-CM

## 2015-05-15 DIAGNOSIS — G44209 Tension-type headache, unspecified, not intractable: Secondary | ICD-10-CM | POA: Diagnosis not present

## 2015-05-15 DIAGNOSIS — R51 Headache: Secondary | ICD-10-CM
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
## 2015-06-04 ENCOUNTER — Telehealth: Payer: Self-pay | Admitting: Neurology

## 2015-06-04 DIAGNOSIS — M542 Cervicalgia: Secondary | ICD-10-CM

## 2015-06-04 DIAGNOSIS — R2689 Other abnormalities of gait and mobility: Secondary | ICD-10-CM

## 2015-06-04 NOTE — Telephone Encounter (Signed)
Patient would like a call back from the nurse regarding orders for Salomon Mast for Balance Master as she is still having issues. Please call [phone].

## 2015-06-04 NOTE — Telephone Encounter (Signed)
Left message for a return call

## 2015-06-05 DIAGNOSIS — R2689 Other abnormalities of gait and mobility: Secondary | ICD-10-CM | POA: Insufficient documentation

## 2015-06-05 NOTE — Telephone Encounter (Signed)
I called the patient and informed her that the order has been placed.

## 2015-06-05 NOTE — Telephone Encounter (Signed)
Patient returned call. She stated that she is still having dizziness and pain in her head (over her ears). The PT she has been working with recommended she get an order from Dr. Krista Blue for a balance disorder assessment with Rudell Cobb at [facility]. I advised the patient I would ask Dr. Krista Blue about this and call her back.

## 2015-06-05 NOTE — Addendum Note (Signed)
Addended by: Marcial Pacas on: 06/05/2015 11:49 AM   Modules accepted: Orders

## 2015-06-05 NOTE — Telephone Encounter (Signed)
I have put in the order for PT

## 2015-06-11 ENCOUNTER — Ambulatory Visit: Payer: Medicare Other | Attending: Neurology | Admitting: Rehabilitative and Restorative Service Providers"

## 2015-06-11 ENCOUNTER — Ambulatory Visit: Payer: Medicare Other | Admitting: Physical Therapy

## 2015-06-11 DIAGNOSIS — R2689 Other abnormalities of gait and mobility: Secondary | ICD-10-CM | POA: Diagnosis present

## 2015-06-11 DIAGNOSIS — R42 Dizziness and giddiness: Secondary | ICD-10-CM | POA: Insufficient documentation

## 2015-06-11 DIAGNOSIS — R269 Unspecified abnormalities of gait and mobility: Secondary | ICD-10-CM

## 2015-06-11 NOTE — Patient Instructions (Signed)
Tip Card 1.The goal of habituation training is to assist in decreasing symptoms of vertigo, dizziness, or nausea provoked by specific head and body motions. 2.These exercises may initially increase symptoms; however, be persistent and work through symptoms. With repetition and time, the exercises will assist in reducing or eliminating symptoms. 3.Exercises should be stopped and discussed with the therapist if you experience any of the following: - Sudden change or fluctuation in hearing - New onset of ringing in the ears, or increase in current intensity - Any fluid discharge from the ear - Severe pain in neck or back - Extreme nausea  Copyright  VHI. All rights reserved.  Rolling   With pillow under head, start on back. Roll to your right side.  Hold until dizziness stops, plus 20 seconds and then roll to the left side.  Hold until dizziness stops, plus 20 seconds.  Repeat sequence 5 times per session. Do 2 sessions per day.  Copyright  VHI. All rights reserved.   Gaze Stabilization: Tip Card 1.Target must remain in focus, not blurry, and appear stationary while head is in motion. 2.Perform exercises with small head movements (45 to either side of midline). 3.Increase speed of head motion so long as target is in focus. 4.If you wear eyeglasses, be sure you can see target through lens (therapist will give specific instructions for bifocal / progressive lenses). 5.These exercises may provoke dizziness or nausea. Work through these symptoms. If too dizzy, slow head movement slightly. Rest between each exercise. 6.Exercises demand concentration; avoid distractions. 7.For safety, perform standing exercises close to a counter, wall, corner, or next to someone.  Copyright  VHI. All rights reserved.  Gaze Stabilization: Standing Feet Apart   Feet shoulder width apart, keeping eyes on target on wall 3 feet away, tilt head down slightly and move head side to side for 30 seconds. Repeat while  moving head up and down for 30 seconds. Do 2 sessions per day.   Copyright  VHI. All rights reserved.   Feet Heel-Toe "Tandem", Varied Arm Positions - Eyes Open    With eyes open, right foot directly in front of the other, arms out, look straight ahead at a stationary object. Hold __30__ seconds. Repeat __3__ times with each foot forward per session. Do _2___ sessions per day.  Copyright  VHI. All rights reserved.
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
## 2015-06-18 ENCOUNTER — Ambulatory Visit: Payer: Medicare Other | Admitting: Rehabilitative and Restorative Service Providers"

## 2015-06-18 DIAGNOSIS — R42 Dizziness and giddiness: Secondary | ICD-10-CM | POA: Diagnosis not present

## 2015-06-18 DIAGNOSIS — R269 Unspecified abnormalities of gait and mobility: Secondary | ICD-10-CM

## 2015-06-18 NOTE — Patient Instructions (Signed)
Compensatory Strategies: Corrective Saccades    1. Holding two stationary targets placed __6-8__ inches apart, move eyes to target, keep head still. 2. Then move head in direction of target while eyes remain on target. 3/4. Repeat in opposite direction. Perform sitting. Repeat sequence __5__ times per session. Do __2_ sessions per day.  Copyright  VHI. All rights reserved.  Sit to Side-Lying    Sit on edge of bed. 1. Turn head to the right. 2. Maintain head position and lie down quickly on left side. Hold until symptoms subside, plus 20 seconds. 3. Sit up slowly. Hold until symptoms subside, plus 20 seconds. 4. Turn head to the left. 5. Maintain head position and lie down slowly on right side. Hold until symptoms subside, plus 20 seconds. 6. Sit up slowly. Repeat sequence __5__ times per session. Do __2__ sessions per day.  Copyright  VHI. All rights reserved.

## 2015-06-18 NOTE — Therapy (Signed)
[facility] [address] [place], [place], [address] Phone: [phone]   Fax:  [phone]  Physical Therapy Treatment  Patient Details  Name: Maria Jackson MRN: [id number] Date of Birth: 17-Feb-1946 Referring Provider: Marcial Pacas, MD  Encounter Date: 06/18/2015      PT End of Session - 06/18/15 1918    Visit Number 2   Number of Visits 4   Date for PT Re-Evaluation 07/11/15   Authorization Type G code every 10th visit   PT Start Time 1024   PT Stop Time 1104   PT Time Calculation (min) 40 min   Activity Tolerance Patient tolerated treatment well   Behavior During Therapy [facility] for tasks assessed/performed      Past Medical History  Diagnosis Date  . Allergy   . Arthritis     knees  . Osteopenia   . Raynaud disease   . Insomnia     NO LONGER AN ISSUE  . Spondylolisthesis     lumbar  . Cancer ([place]) 11/05/11    SQUAMOS CELL CARCINOMA - LEFT CALF  . Seasonal allergies   . Chronic lower back pain   . Concussion     Past Surgical History  Procedure Laterality Date  . Colonoscopy    . Tonsillectomy    . Cholecystectomy    . Appendectomy    . Vaginal hysterectomy      tah/bso  . Wisdom tooth extraction    . Foot neuroma surgery      bilateral  . Endoscopic vein laser treatment      calf riht and left  . Total knee arthroplasty Left 04/27/2013    Procedure: LEFT TOTAL KNEE ARTHROPLASTY;  Surgeon: Gearlean Alf, MD;  Location: [facility];  Service: Orthopedics;  Laterality: Left;  . Joint replacement    . Total knee arthroplasty Right 08/27/2013    Procedure: RIGHT TOTAL KNEE ARTHROPLASTY;  Surgeon: Gearlean Alf, MD;  [facility]: [facility];  Service: Orthopedics;  Laterality: Right;    There were no vitals filed for this visit.  Visit Diagnosis:  Dizziness and giddiness  Abnormality of gait      Subjective Assessment - 06/18/15 1022    Subjective The patient feels that exercises are helping with  balance and she feels more confident on stairs.   Currently in Pain? Yes   Pain Score 4    Pain Location Head   Pain Orientation Posterior;Lower   Pain Descriptors / Indicators Aching   Pain Type Acute pain   Pain Onset More than a month ago   Pain Frequency Intermittent   Aggravating Factors  worse over last 2 days   Pain Relieving Factors intermittent/unsure            Vestibular Treatment/Exercise - 06/18/15 1025    Vestibular Treatment/Exercise   Vestibular Treatment Provided Habituation;Gaze   Habituation Exercises Laruth Bouchard Daroff;Horizontal Roll   Gaze Exercises X1 Viewing Horizontal   Nestor Lewandowsky   Number of Reps  5   Symptom Description  pressure in the back of her head with sit<>bilateral sidelying that improves with repetition (no symptoms to the left)   Horizontal Roll   Number of Reps  3     Gaze in seated position emphasizing speed and amplitude of movement Seated corrective saccade exercises and smooth pursuits in horizontal plane.  Seated horizontal head turns for habituation      PT Education - 06/18/15 1918    Education provided Yes  Education Details HEP: corrective saccades, and brandt daroff habituation exercises   Person(s) Educated Patient   Methods Explanation;Demonstration;Handout   Comprehension Verbalized understanding;Returned demonstration           PT Long Term Goals - 06/11/15 1027    PT LONG TERM GOAL #1   Title The patient will be indep with HEP for gaze adaptation, habituation, and high level balance.   Baseline Target date 07/11/2015   Time 4   Period Weeks   PT LONG TERM GOAL #2   Title The patient will tolerate gaze x 1 viewing with head pressure not changing from baseline (was 4/10 and increased with activity at eval).   Baseline Target date 07/11/2015   Time 4   Period Weeks   PT LONG TERM GOAL #3   Title The patient will improve functional status score from 55% to > or equal to 64% to demo improved subjective perception  of mobility.   Baseline Target date 07/11/2015   Time 4   Period Weeks   PT LONG TERM GOAL #4   Title The patient will perform horizontal rolling with 0/10 symptoms of dizziness or pressure in head for improved motion tolerance.   Baseline Target date 07/11/2015   Time 4   Period Weeks               Plan - 06/18/15 1919    Clinical Impression Statement The patient has mild pressure sensation worse with movement.  She is being treated with habituaiton for motion sensitivity for this symptom.  She is also c/o double vision in downward gaze at times worse during rolling to the right side.  PT recommends patient do eye exercises with glasses donned to help with visual fixation/focus.     PT Next Visit Plan Progress gaze, habituation, high level balance to tolerance.   Consulted and Agree with Plan of Care Patient        Problem List Patient Active Problem List   Diagnosis Date Noted  . Balance disorder 06/05/2015  . Neck pain 05/15/2015  . Headache 05/15/2015  . Postoperative anemia due to acute blood loss 08/28/2013  . OA (osteoarthritis) of knee 04/27/2013  . 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 Vaginal atrophy 05/25/2012  . Osteopenia 05/24/2011  . CONSTIPATION 08/27/2009  . ULCERATION OF INTESTINE 08/27/2009    Camaria Gerald, PT 06/18/2015, 7:21 PM  [facility] [address] [place], [place], [address] Phone: [phone]   Fax:  [phone]  Name: Maria Jackson MRN: [id number] Date of Birth: 1945-12-28

## 2015-06-23 ENCOUNTER — Ambulatory Visit: Payer: Medicare Other | Admitting: Rehabilitative and Restorative Service Providers"

## 2015-06-26 ENCOUNTER — Ambulatory Visit: Payer: Medicare Other | Admitting: Rehabilitative and Restorative Service Providers"

## 2015-06-26 DIAGNOSIS — R42 Dizziness and giddiness: Secondary | ICD-10-CM

## 2015-06-26 DIAGNOSIS — R269 Unspecified abnormalities of gait and mobility: Secondary | ICD-10-CM
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
## 2015-06-27 ENCOUNTER — Ambulatory Visit: Payer: Medicare Other | Attending: Orthopedic Surgery | Admitting: Physical Therapy

## 2015-06-27 DIAGNOSIS — M545 Low back pain, unspecified: Secondary | ICD-10-CM

## 2015-06-27 DIAGNOSIS — R269 Unspecified abnormalities of gait and mobility: Secondary | ICD-10-CM | POA: Diagnosis present

## 2015-06-27 DIAGNOSIS — M542 Cervicalgia: Secondary | ICD-10-CM | POA: Insufficient documentation

## 2015-06-27 DIAGNOSIS — R2689 Other abnormalities of gait and mobility: Secondary | ICD-10-CM | POA: Diagnosis present

## 2015-06-27 DIAGNOSIS — R42 Dizziness and giddiness: Secondary | ICD-10-CM | POA: Insufficient documentation
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
## 2015-07-01 ENCOUNTER — Encounter: Payer: Self-pay | Admitting: *Deleted

## 2015-07-01 ENCOUNTER — Ambulatory Visit: Payer: Medicare Other | Admitting: *Deleted

## 2015-07-01 DIAGNOSIS — R42 Dizziness and giddiness: Secondary | ICD-10-CM

## 2015-07-01 DIAGNOSIS — M545 Low back pain, unspecified: Secondary | ICD-10-CM

## 2015-07-01 DIAGNOSIS — R269 Unspecified abnormalities of gait and mobility: Secondary | ICD-10-CM

## 2015-07-01 DIAGNOSIS — M542 Cervicalgia: Secondary | ICD-10-CM

## 2015-07-01 DIAGNOSIS — R2689 Other abnormalities of gait and mobility: Secondary | ICD-10-CM
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
## 2015-07-02 ENCOUNTER — Ambulatory Visit: Payer: Medicare Other | Admitting: Rehabilitative and Restorative Service Providers"

## 2015-07-02 DIAGNOSIS — R42 Dizziness and giddiness: Secondary | ICD-10-CM

## 2015-07-02 DIAGNOSIS — R269 Unspecified abnormalities of gait and mobility: Secondary | ICD-10-CM

## 2015-07-02 DIAGNOSIS — R2689 Other abnormalities of gait and mobility: Secondary | ICD-10-CM

## 2015-07-02 NOTE — Patient Instructions (Signed)
Feet Apart (Compliant Surface) Varied Arm Positions - Eyes Closed    Stand on compliant surface: _pillow_______ with feet shoulder width apart and arms at your side. Close eyes and visualize upright position. Hold__20-30__ seconds. Repeat __3__ times per session. Do _2___ sessions per day.  Copyright  VHI. All rights reserved.  Feet Together (Compliant Surface) Varied Arm Positions - Eyes Closed    Stand on compliant surface: __pillow______ with feet together and arms at your side. Close eyes and visualize upright position. Hold__20-30__ seconds. Repeat __3__ times per session. Do _2___ sessions per day.  Copyright  VHI. All rights reserved.   Feet Apart (Compliant Surface) Head Motion - Eyes Open    With eyes open, standing on compliant surface: _pillow_______, feet shoulder width apart, move head slowly: side to side. Repeat __5__ times per session. Do __2__ sessions per day.  Copyright  VHI. All rights reserved.    *INCREASE THE LETTER EXERCISE 45 SECONDS X 3 TIMES/DAY (when able).

## 2015-07-03 ENCOUNTER — Encounter: Payer: Medicare Other | Admitting: *Deleted
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
## 2015-07-04 ENCOUNTER — Ambulatory Visit: Payer: Medicare Other | Admitting: *Deleted

## 2015-07-04 DIAGNOSIS — M545 Low back pain, unspecified: Secondary | ICD-10-CM
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
## 2015-07-09 ENCOUNTER — Ambulatory Visit: Payer: Medicare Other | Attending: Neurology | Admitting: Rehabilitative and Restorative Service Providers"

## 2015-07-09 DIAGNOSIS — R42 Dizziness and giddiness: Secondary | ICD-10-CM | POA: Diagnosis present

## 2015-07-09 DIAGNOSIS — R269 Unspecified abnormalities of gait and mobility: Secondary | ICD-10-CM | POA: Diagnosis not present

## 2015-07-09 NOTE — Addendum Note (Signed)
Addended by: Rudell Cobb M on: 07/09/2015 02:43 PM   Modules accepted: Orders
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
## 2015-07-10 ENCOUNTER — Encounter: Payer: Medicare Other | Admitting: *Deleted

## 2015-07-15 ENCOUNTER — Ambulatory Visit: Payer: Medicare Other | Attending: Orthopedic Surgery | Admitting: *Deleted

## 2015-07-15 ENCOUNTER — Encounter: Payer: Self-pay | Admitting: *Deleted

## 2015-07-15 DIAGNOSIS — M545 Low back pain, unspecified: Secondary | ICD-10-CM

## 2015-07-15 DIAGNOSIS — M542 Cervicalgia: Secondary | ICD-10-CM

## 2015-07-15 DIAGNOSIS — R42 Dizziness and giddiness: Secondary | ICD-10-CM

## 2015-07-15 DIAGNOSIS — R269 Unspecified abnormalities of gait and mobility: Secondary | ICD-10-CM

## 2015-07-15 DIAGNOSIS — R2689 Other abnormalities of gait and mobility: Secondary | ICD-10-CM

## 2015-07-15 NOTE — Patient Instructions (Signed)
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
## 2015-07-18 ENCOUNTER — Ambulatory Visit: Payer: Medicare Other | Admitting: *Deleted

## 2015-07-18 DIAGNOSIS — R42 Dizziness and giddiness: Secondary | ICD-10-CM | POA: Diagnosis not present

## 2015-07-18 DIAGNOSIS — M545 Low back pain, unspecified: Secondary | ICD-10-CM

## 2015-07-18 NOTE — Therapy (Signed)
[facility] [place], [place], [address] Phone: [phone]   Fax:  [phone]  Physical Therapy Treatment  Patient Details  Name: Maria Jackson MRN: [id number] Date of Birth: [birth year] Referring Provider: Melina Schools MD.  Encounter Date: 07/18/2015      PT End of Session - 07/18/15 1248    Visit Number 5  5th PT RX   Number of Visits 8   Date for PT Re-Evaluation 08/08/15   Authorization Type G code every 10th visit  *CURRENTLY AT 10TH COMBINED VISIT   PT Start Time 1115   PT Stop Time 1217   PT Time Calculation (min) 62 min      Past Medical History  Diagnosis Date  . Allergy   . Arthritis     knees  . Osteopenia   . Raynaud disease   . Insomnia     NO LONGER AN ISSUE  . Spondylolisthesis     lumbar  . Cancer ([place]) 11/05/11    SQUAMOS CELL CARCINOMA - LEFT CALF  . Seasonal allergies   . Chronic lower back pain   . Concussion     Past Surgical History  Procedure Laterality Date  . Colonoscopy    . Tonsillectomy    . Cholecystectomy    . Appendectomy    . Vaginal hysterectomy      tah/bso  . Wisdom tooth extraction    . Foot neuroma surgery      bilateral  . Endoscopic vein laser treatment      calf riht and left  . Total knee arthroplasty Left 04/27/2013    Procedure: LEFT TOTAL KNEE ARTHROPLASTY;  Surgeon: Gearlean Alf, MD;  Location: [facility];  Service: Orthopedics;  Laterality: Left;  . Joint replacement    . Total knee arthroplasty Right 08/27/2013    Procedure: RIGHT TOTAL KNEE ARTHROPLASTY;  Surgeon: Gearlean Alf, MD;  Location: [facility];  Service: Orthopedics;  Laterality: Right;    There were no vitals filed for this visit.  Visit Diagnosis:  Acute left-sided low back pain without sciatica                       [facility] Adult PT Treatment/Exercise - 07/18/15 0001    Lumbar Exercises: Supine   Ab Set 20 reps;5 seconds  Draw-in also discussed performing in  standing, and sitting   Bent Knee Raise 20 reps;3 seconds;10 reps  Marching with V/Cs to breathe normally   Bridge 20 reps;3 seconds   Modalities   Modalities Electrical Stimulation;Moist Heat;Ultrasound   Moist Heat Therapy   Number Minutes Moist Heat 15 Minutes   Moist Heat Location Lumbar Spine   Electrical Stimulation   Electrical Stimulation Location Left LB/SIJ region. PRemod x 15 mins 80-150hz   Electrical Stimulation Goals Pain   Ultrasound   Ultrasound Location LT SIJ and paras   Ultrasound Parameters 1.5 w/cm3 x 12 mins   Ultrasound Goals Pain   Manual Therapy   Manual Therapy Soft tissue mobilization;Myofascial release   Soft tissue mobilization IASTM to LB paras and SIJ with Pt in RT sidelying and a pillow B/W his knees                  PT Short Term Goals - 07/03/15 1209    PT SHORT TERM GOAL #1   Title STGs=LTGs           PT Long Term Goals - 07/09/15 1418  PT LONG TERM GOAL #1   Title The patient will be indep with HEP for gaze adaptation, habituation, and high level balance.   Baseline Modified target date 08/08/2015   Time 4   Period Weeks   Status On-going   PT LONG TERM GOAL #2   Title The patient will tolerate gaze x 1 viewing with head pressure not changing from baseline (was 4/10 and increased with activity at eval).   Baseline Met on 07/02/2015.  Patient reports minimal visual slip of target    Time 4   Period Weeks   Status Achieved   PT LONG TERM GOAL #3   Title The patient will improve functional status score from 55% to > or equal to 64% to demo improved subjective perception of mobility.   Baseline Target date 07/11/2015   Time 4   Period Weeks   Status On-going   PT LONG TERM GOAL #4   Title The patient will perform horizontal rolling with 0/10 symptoms of dizziness or pressure in head for improved motion tolerance.   Baseline Met on 07/02/2015 with patient reporting mild double vision.   Time 4   Period Weeks   Status  Achieved   PT LONG TERM GOAL #5   Title The patient will tolerate gaze x 1 viewing x 45 seconds withut c/o visual blurring.   Baseline Target date 08/08/2015   Time 4   Period Weeks   Status New               Plan - 07/18/15 1222    Clinical Impression Statement Pt did well with Rx today and had less notable tightness around SIJ and paras on LT side. She did well with core exs and bridging was added today. Minimal pain increase with core exs. and  Pt continues to progress toward goals. She had 5/10 pain this AM and 2/10 after Rx today. Goals are Ongoing at this time   Pt will benefit from skilled therapeutic intervention in order to improve on the following deficits Pain;Decreased activity tolerance   Rehab Potential Good   Clinical Impairments Affecting Rehab Potential Osteopenia.   PT Frequency 3x / week   PT Duration 4 weeks   PT Treatment/Interventions Electrical Stimulation;Moist Heat;Therapeutic exercise;Therapeutic activities;Manual techniques   PT Next Visit Plan  10th visit  FOTO Gcode       HEP and bridging         Problem List Patient Active Problem List   Diagnosis Date Noted  . Balance disorder 06/05/2015  . Neck pain 05/15/2015  . Headache 05/15/2015  . Postoperative anemia due to acute blood loss 08/28/2013  . OA (osteoarthritis) of knee 04/27/2013  . 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 Vaginal atrophy 05/25/2012  . Osteopenia 05/24/2011  . CONSTIPATION 08/27/2009  . ULCERATION OF INTESTINE 08/27/2009    RAMSEUR,CHRIS, PTA 07/18/2015, 12:49 PM  [facility] [address] [place], [place], [address] Phone: [phone]   Fax:  [phone]  Name: Maria Jackson MRN: [id number] Date of Birth: [birth year]

## 2015-07-22 ENCOUNTER — Ambulatory Visit: Payer: Medicare Other | Admitting: *Deleted

## 2015-07-22 ENCOUNTER — Encounter: Payer: Self-pay | Admitting: *Deleted

## 2015-07-22 DIAGNOSIS — M545 Low back pain, unspecified: Secondary | ICD-10-CM

## 2015-07-22 DIAGNOSIS — R42 Dizziness and giddiness: Secondary | ICD-10-CM | POA: Diagnosis not present
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
## 2015-07-23 ENCOUNTER — Ambulatory Visit: Payer: Medicare Other | Admitting: Rehabilitative and Restorative Service Providers"

## 2015-07-23 ENCOUNTER — Encounter: Payer: Self-pay | Admitting: Rehabilitative and Restorative Service Providers"

## 2015-07-23 DIAGNOSIS — R42 Dizziness and giddiness: Secondary | ICD-10-CM

## 2015-07-23 DIAGNOSIS — R269 Unspecified abnormalities of gait and mobility: Secondary | ICD-10-CM | POA: Diagnosis not present

## 2015-07-23 NOTE — Therapy (Signed)
[facility] [address] [place], [place], [address] Phone: [phone]   Fax:  [phone]  Patient Details  Name: Maria Jackson MRN: [id number] Date of Birth: 22-Aug-1945 Referring Provider:  No ref. provider found  Encounter Date: 07/23/2015  PHYSICAL THERAPY DISCHARGE SUMMARY  Visits from Start of Care: 6  Current functional level related to goals / functional outcomes:     PT Long Term Goals - 07/23/15 1924    PT LONG TERM GOAL #1   Title The patient will be indep with HEP for gaze adaptation, habituation, and high level balance.   Baseline Modified target date 08/08/2015   Time 4   Period Weeks   Status Achieved   PT LONG TERM GOAL #2   Title The patient will tolerate gaze x 1 viewing with head pressure not changing from baseline (was 4/10 and increased with activity at eval).   Baseline Met on 07/02/2015.  Patient reports minimal visual slip of target    Time 4   Period Weeks   Status Achieved   PT LONG TERM GOAL #3   Title The patient will improve functional status score from 55% to > or equal to 64% to demo improved subjective perception of mobility.   Baseline The patient was provided a neck functional status score vs dizziness, deferred due to wrong survey.   Time 4   Period Weeks   Status Deferred   PT LONG TERM GOAL #4   Title The patient will perform horizontal rolling with 0/10 symptoms of dizziness or pressure in head for improved motion tolerance.   Baseline Met on 07/02/2015 with patient reporting mild double vision.   Time 4   Period Weeks   Status Achieved   PT LONG TERM GOAL #5   Title The patient will tolerate gaze x 1 viewing x 45 seconds withut c/o visual blurring.   Baseline Target date 08/08/2015   Time 4   Period Weeks   Status Achieved        Remaining deficits: Visual changes- to be further assessed by neuroopthalmologist   Education / Equipment: HEP, progression of home  program.  Plan: Patient agrees to discharge.  Patient goals were met. Patient is being discharged due to meeting the stated rehab goals.  ?????       Thank you for the referral of this patient. Rudell Cobb, MPT  [place] 07/23/2015, 9:25 PM  [facility] [address] [place], [place], [address] Phone: [phone]   Fax:  [phone]
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
## 2015-07-23 NOTE — Patient Instructions (Signed)
Continue performing the first 3 exercises for the next 4-6 weeks   Gaze Stabilization: Tip Card 1.Target must remain in focus, not blurry, and appear stationary while head is in motion. 2.Perform exercises with small head movements (45 to either side of midline). 3.Increase speed of head motion so long as target is in focus. 4.If you wear eyeglasses, be sure you can see target through lens (therapist will give specific instructions for bifocal / progressive lenses). 5.These exercises may provoke dizziness or nausea. Work through these symptoms. If too dizzy, slow head movement slightly. Rest between each exercise. 6.Exercises demand concentration; avoid distractions. 7.For safety, perform standing exercises close to a counter, wall, corner, or next to someone.  Copyright  VHI. All rights reserved.  Gaze Stabilization: Standing Feet Apart   Feet shoulder width apart, keeping eyes on target on wall 3 feet away, tilt head down slightly and move head side to side for 30 seconds. Repeat while moving head up and down for 45 seconds. Do 2 sessions per day.   Copyright  VHI. All rights reserved.   Feet Heel-Toe "Tandem", Varied Arm Positions - Eyes Open    With eyes open, right foot directly in front of the other, arms out, look straight ahead at a stationary object. Hold __30__ seconds. Repeat __3__ times with each foot forward per session. Do _2___ sessions per day.  Copyright  VHI. All rights reserved.   Compensatory Strategies: Corrective Saccades    1. Holding two stationary targets placed __6-8__ inches apart, move eyes to target, keep head still. 2. Then move head in direction of target while eyes remain on target. 3/4. Repeat in opposite direction. Perform sitting. Repeat sequence __5__ times per session. Do __2_ sessions per day.  Copyright  VHI. All rights reserved.   ONLY PERFORM THESE EXERCISES IF YOU NOTE DIZZINESS WITH MOVING INTO THESE POSITIONS.  COMPLETE  UNTIL YOU HAVE 2 DAYS SYMPTOMS FREE WITH THE EXERCISE.  Tip Card 1.The goal of habituation training is to assist in decreasing symptoms of vertigo, dizziness, or nausea provoked by specific head and body motions. 2.These exercises may initially increase symptoms; however, be persistent and work through symptoms. With repetition and time, the exercises will assist in reducing or eliminating symptoms. 3.Exercises should be stopped and discussed with the therapist if you experience any of the following: - Sudden change or fluctuation in hearing - New onset of ringing in the ears, or increase in current intensity - Any fluid discharge from the ear - Severe pain in neck or back - Extreme nausea  Copyright  VHI. All rights reserved.  Rolling   With pillow under head, start on back. Roll to your right side. Hold until dizziness stops, plus 20 seconds and then roll to the left side. Hold until dizziness stops, plus 20 seconds. Repeat sequence 5 times per session. Do 2 sessions per day.  Copyright  VHI. All rights reserved.    Sit to Side-Lying    Sit on edge of bed. 1. Turn head to the right. 2. Maintain head position and lie down quickly on left side. Hold until symptoms subside, plus 20 seconds. 3. Sit up slowly. Hold until symptoms subside, plus 20 seconds. 4. Turn head to the left. 5. Maintain head position and lie down slowly on right side. Hold until symptoms subside, plus 20 seconds. 6. Sit up slowly. Repeat sequence __5__ times per session. Do __2__ sessions per day.  Copyright  VHI. All rights reserved.    [place], PT12/01/2015  10:45 AM Maria Jackson [address] [place], [place], [address] Phone: [phone] Fax: [phone]

## 2015-07-24 ENCOUNTER — Encounter: Payer: Self-pay | Admitting: *Deleted

## 2015-07-24 ENCOUNTER — Ambulatory Visit: Payer: Medicare Other | Admitting: *Deleted

## 2015-07-24 DIAGNOSIS — M545 Low back pain, unspecified: Secondary | ICD-10-CM

## 2015-07-24 DIAGNOSIS — R42 Dizziness and giddiness: Secondary | ICD-10-CM | POA: Diagnosis not present
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
## 2015-07-25 ENCOUNTER — Encounter: Payer: Medicare Other | Admitting: *Deleted

## 2015-07-28 DIAGNOSIS — R42 Dizziness and giddiness: Secondary | ICD-10-CM | POA: Diagnosis not present

## 2015-07-28 NOTE — Therapy (Signed)
[facility] [place], [place], [address] Phone: [phone]   Fax:  [phone]  Physical Therapy Treatment  Patient Details  Name: Maria Jackson MRN: [id number] Date of Birth: [birth year] Referring Provider: Melina Schools MD.  Encounter Date: 07/24/2015    Past Medical History  Diagnosis Date  . Allergy   . Arthritis     knees  . Osteopenia   . Raynaud disease   . Insomnia     NO LONGER AN ISSUE  . Spondylolisthesis     lumbar  . Cancer ([place]) 11/05/11    SQUAMOS CELL CARCINOMA - LEFT CALF  . Seasonal allergies   . Chronic lower back pain   . Concussion     Past Surgical History  Procedure Laterality Date  . Colonoscopy    . Tonsillectomy    . Cholecystectomy    . Appendectomy    . Vaginal hysterectomy      tah/bso  . Wisdom tooth extraction    . Foot neuroma surgery      bilateral  . Endoscopic vein laser treatment      calf riht and left  . Total knee arthroplasty Left 04/27/2013    Procedure: LEFT TOTAL KNEE ARTHROPLASTY;  Surgeon: Gearlean Alf, MD;  Location: [facility];  Service: Orthopedics;  Laterality: Left;  . Joint replacement    . Total knee arthroplasty Right 08/27/2013    Procedure: RIGHT TOTAL KNEE ARTHROPLASTY;  Surgeon: Gearlean Alf, MD;  Location: [facility];  Service: Orthopedics;  Laterality: Right;    There were no vitals filed for this visit.  Visit Diagnosis:  Acute left-sided low back pain without sciatica                                 PT Short Term Goals - 07/24/15 0936    PT SHORT TERM GOAL #1   Title STGs=LTGs   Time 2   Period Weeks   Status Achieved           PT Long Term Goals - 07/23/15 1924    PT LONG TERM GOAL #1   Title The patient will be indep with HEP for gaze adaptation, habituation, and high level balance.   Baseline Modified target date 08/08/2015   Time 4   Period Weeks   Status Achieved   PT LONG TERM GOAL #2   Title  The patient will tolerate gaze x 1 viewing with head pressure not changing from baseline (was 4/10 and increased with activity at eval).   Baseline Met on 07/02/2015.  Patient reports minimal visual slip of target    Time 4   Period Weeks   Status Achieved   PT LONG TERM GOAL #3   Title The patient will improve functional status score from 55% to > or equal to 64% to demo improved subjective perception of mobility.   Baseline The patient was provided a neck functional status score vs dizziness, deferred due to wrong survey.   Time 4   Period Weeks   Status Deferred   PT LONG TERM GOAL #4   Title The patient will perform horizontal rolling with 0/10 symptoms of dizziness or pressure in head for improved motion tolerance.   Baseline Met on 07/02/2015 with patient reporting mild double vision.   Time 4   Period Weeks   Status Achieved   PT LONG TERM GOAL #5   Title  The patient will tolerate gaze x 1 viewing x 45 seconds withut c/o visual blurring.   Baseline Target date 08/08/2015   Time 4   Period Weeks   Status Achieved                 G-Codes - 2015/08/06 1007    Functional Assessment Tool Used Clinical judgement..d/c.   Mobility: Walking and Moving Around Goal Status [phone]) At least 1 percent but less than 20 percent impaired, limited or restricted   Mobility: Walking and Moving Around Discharge Status [phone]) At least 1 percent but less than 20 percent impaired, limited or restricted   Other PT Primary Goal Status (T0177) At least 1 percent but less than 20 percent impaired, limited or restricted      Problem List Patient Active Problem List   Diagnosis Date Noted  . Balance disorder 06/05/2015  . Neck pain 05/15/2015  . Headache 05/15/2015  . Postoperative anemia due to acute blood loss 08/28/2013  . OA (osteoarthritis) of knee 04/27/2013  . 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 Vaginal atrophy 05/25/2012  . Osteopenia 05/24/2011  . CONSTIPATION  08/27/2009  . ULCERATION OF INTESTINE 08/27/2009   PHYSICAL THERAPY DISCHARGE SUMMARY  Visits from Start of Care:   Current functional level related to goals / functional outcomes: Please see above.   Remaining deficits: Patient pleased with current status regarding her lumbar region.   Education / Equipment: HEP. Plan: Patient agrees to discharge.  Patient goals were met. Patient is being discharged due to meeting the stated rehab goals.  ?????      Wava Kildow, [place] MPT 08-06-2015, 10:09 AM  [facility] [address] [place], [place], [address] Phone: [phone]   Fax:  [phone]  Name: Maria Jackson MRN: [id number] Date of Birth: 01-13-1946

## 2015-08-20 DIAGNOSIS — L570 Actinic keratosis: Secondary | ICD-10-CM | POA: Diagnosis not present

## 2015-08-20 DIAGNOSIS — L814 Other melanin hyperpigmentation: Secondary | ICD-10-CM | POA: Diagnosis not present

## 2015-08-20 DIAGNOSIS — Z85828 Personal history of other malignant neoplasm of skin: Secondary | ICD-10-CM | POA: Diagnosis not present

## 2015-08-20 DIAGNOSIS — D2272 Melanocytic nevi of left lower limb, including hip: Secondary | ICD-10-CM | POA: Diagnosis not present

## 2015-08-20 DIAGNOSIS — D0471 Carcinoma in situ of skin of right lower limb, including hip: Secondary | ICD-10-CM | POA: Diagnosis not present

## 2015-08-20 DIAGNOSIS — D2262 Melanocytic nevi of left upper limb, including shoulder: Secondary | ICD-10-CM | POA: Diagnosis not present

## 2015-08-20 DIAGNOSIS — D485 Neoplasm of uncertain behavior of skin: Secondary | ICD-10-CM | POA: Diagnosis not present

## 2015-08-20 DIAGNOSIS — L821 Other seborrheic keratosis: Secondary | ICD-10-CM | POA: Diagnosis not present

## 2015-08-20 DIAGNOSIS — D1801 Hemangioma of skin and subcutaneous tissue: Secondary | ICD-10-CM | POA: Diagnosis not present

## 2015-08-20 DIAGNOSIS — D2261 Melanocytic nevi of right upper limb, including shoulder: Secondary | ICD-10-CM | POA: Diagnosis not present

## 2015-08-20 DIAGNOSIS — D225 Melanocytic nevi of trunk: Secondary | ICD-10-CM | POA: Diagnosis not present

## 2015-08-25 DIAGNOSIS — M47816 Spondylosis without myelopathy or radiculopathy, lumbar region: Secondary | ICD-10-CM | POA: Diagnosis not present

## 2015-08-25 DIAGNOSIS — M545 Low back pain: Secondary | ICD-10-CM | POA: Diagnosis not present

## 2015-08-25 DIAGNOSIS — M5136 Other intervertebral disc degeneration, lumbar region: Secondary | ICD-10-CM | POA: Diagnosis not present

## 2015-09-02 ENCOUNTER — Telehealth: Payer: Self-pay | Admitting: Neurology

## 2015-09-02 ENCOUNTER — Encounter: Payer: Self-pay | Admitting: Neurology

## 2015-09-02 ENCOUNTER — Ambulatory Visit (INDEPENDENT_AMBULATORY_CARE_PROVIDER_SITE_OTHER): Payer: Medicare Other | Admitting: Neurology

## 2015-09-02 VITALS — BP 135/68 | HR 60 | Ht 62.5 in | Wt 149.0 lb

## 2015-09-02 DIAGNOSIS — H532 Diplopia: Secondary | ICD-10-CM

## 2015-09-02 DIAGNOSIS — M542 Cervicalgia: Secondary | ICD-10-CM | POA: Diagnosis not present

## 2015-09-02 NOTE — Telephone Encounter (Signed)
Patient is concerned about continued headaches - she has been worked into the schedule today.
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
## 2015-09-11 DIAGNOSIS — Z96651 Presence of right artificial knee joint: Secondary | ICD-10-CM | POA: Diagnosis not present

## 2015-09-11 DIAGNOSIS — D0471 Carcinoma in situ of skin of right lower limb, including hip: Secondary | ICD-10-CM | POA: Diagnosis not present

## 2015-09-11 DIAGNOSIS — Z85828 Personal history of other malignant neoplasm of skin: Secondary | ICD-10-CM | POA: Diagnosis not present

## 2015-09-11 DIAGNOSIS — Z471 Aftercare following joint replacement surgery: Secondary | ICD-10-CM | POA: Diagnosis not present

## 2015-11-12 DIAGNOSIS — R921 Mammographic calcification found on diagnostic imaging of breast: Secondary | ICD-10-CM | POA: Diagnosis not present

## 2016-01-09 DIAGNOSIS — H532 Diplopia: Secondary | ICD-10-CM | POA: Diagnosis not present

## 2016-01-09 DIAGNOSIS — H04123 Dry eye syndrome of bilateral lacrimal glands: Secondary | ICD-10-CM | POA: Diagnosis not present

## 2016-01-09 DIAGNOSIS — H5021 Vertical strabismus, right eye: Secondary | ICD-10-CM | POA: Diagnosis not present

## 2016-01-09 DIAGNOSIS — H40013 Open angle with borderline findings, low risk, bilateral: Secondary | ICD-10-CM | POA: Diagnosis not present

## 2016-01-09 DIAGNOSIS — R51 Headache: Secondary | ICD-10-CM | POA: Diagnosis not present

## 2016-01-14 DIAGNOSIS — Z85828 Personal history of other malignant neoplasm of skin: Secondary | ICD-10-CM | POA: Diagnosis not present

## 2016-01-14 DIAGNOSIS — L821 Other seborrheic keratosis: Secondary | ICD-10-CM | POA: Diagnosis not present

## 2016-01-14 DIAGNOSIS — D1801 Hemangioma of skin and subcutaneous tissue: Secondary | ICD-10-CM | POA: Diagnosis not present

## 2016-02-27 ENCOUNTER — Telehealth: Payer: Self-pay | Admitting: *Deleted

## 2016-02-27 ENCOUNTER — Ambulatory Visit (INDEPENDENT_AMBULATORY_CARE_PROVIDER_SITE_OTHER): Payer: Medicare Other

## 2016-02-27 ENCOUNTER — Ambulatory Visit (HOSPITAL_COMMUNITY)
Admission: RE | Admit: 2016-02-27 | Discharge: 2016-02-27 | Disposition: A | Discharge status: Home / Self Care | Payer: Medicare Other | Source: Ambulatory Visit | Attending: Vascular Surgery | Admitting: Vascular Surgery

## 2016-02-27 ENCOUNTER — Encounter: Payer: Self-pay | Admitting: Podiatry

## 2016-02-27 ENCOUNTER — Ambulatory Visit (INDEPENDENT_AMBULATORY_CARE_PROVIDER_SITE_OTHER): Payer: Medicare Other | Admitting: Podiatry

## 2016-02-27 VITALS — BP 124/70 | HR 58 | Ht 62.5 in | Wt 145.0 lb

## 2016-02-27 DIAGNOSIS — M79605 Pain in left leg: Secondary | ICD-10-CM | POA: Insufficient documentation

## 2016-02-27 DIAGNOSIS — R600 Localized edema: Secondary | ICD-10-CM | POA: Insufficient documentation

## 2016-02-27 DIAGNOSIS — I8392 Asymptomatic varicose veins of left lower extremity: Secondary | ICD-10-CM | POA: Diagnosis not present

## 2016-02-27 DIAGNOSIS — M779 Enthesopathy, unspecified: Secondary | ICD-10-CM

## 2016-02-27 DIAGNOSIS — M79672 Pain in left foot: Secondary | ICD-10-CM | POA: Diagnosis not present

## 2016-02-27 NOTE — Telephone Encounter (Signed)
Cathey called with preliminary results of left venous doppler, is negative for DVT.  I told Cathey to release pt to go home and if there were further instructions from Dr. Paulla Dolly I would call pt.

## 2016-02-27 NOTE — Progress Notes (Signed)
   Subjective:    Patient ID: Maria Jackson, female    DOB: 1945-10-22, 70 y.o.   MRN: [id number]  HPI  Chief Complaint  Patient presents with  . Foot Pain    left foot, trouble walking, dorsal and plantar forefoot x 1 wk.  No injury.         Review of Systems  Constitutional: Positive for activity change.  HENT: Positive for sinus pressure and tinnitus.   Cardiovascular: Positive for leg swelling.  Neurological: Positive for headaches.       Objective:   Physical Exam        Assessment & Plan:

## 2016-03-01 NOTE — Progress Notes (Signed)
Subjective:     Patient ID: Maria Jackson, female   DOB: 1946-03-22, 70 y.o.   MRN: [id number]  HPI patient states she's had pain in both the top and bottom of her foot and does not remember specific injury   Review of Systems     Objective:   Physical Exam Neurovascular status found to be intact muscle strength was adequate range of motion within normal limits with patient found to have edema in the left foot that's painful when pressed with +1 to +2 pitting edema. There is negative Homans sign noted currently but she is having quite a bit of discomfort and patient has good digital perfusion and is well oriented 3    Assessment:     Inflammatory condition that is present left with fluid buildup noted    Plan:     Reviewed condition and placed in an Unna boot at this time and we did go ahead and we got a Doppler indicating no signs of clot and she will wear this for the next few days and be seen back and we'll go to the hospital if she should develop any increased swelling or pain

## 2016-03-03 DIAGNOSIS — Z0000 Encounter for general adult medical examination without abnormal findings: Secondary | ICD-10-CM | POA: Diagnosis not present

## 2016-03-03 DIAGNOSIS — R8299 Other abnormal findings in urine: Secondary | ICD-10-CM | POA: Diagnosis not present

## 2016-03-03 DIAGNOSIS — M859 Disorder of bone density and structure, unspecified: Secondary | ICD-10-CM | POA: Diagnosis not present

## 2016-03-03 DIAGNOSIS — D539 Nutritional anemia, unspecified: Secondary | ICD-10-CM | POA: Diagnosis not present

## 2016-03-03 DIAGNOSIS — D538 Other specified nutritional anemias: Secondary | ICD-10-CM | POA: Diagnosis not present

## 2016-03-04 ENCOUNTER — Ambulatory Visit (INDEPENDENT_AMBULATORY_CARE_PROVIDER_SITE_OTHER): Payer: Medicare Other | Admitting: Neurology

## 2016-03-04 ENCOUNTER — Ambulatory Visit (INDEPENDENT_AMBULATORY_CARE_PROVIDER_SITE_OTHER): Payer: Medicare Other | Admitting: Podiatry

## 2016-03-04 ENCOUNTER — Encounter: Payer: Self-pay | Admitting: Neurology

## 2016-03-04 ENCOUNTER — Ambulatory Visit: Payer: Medicare Other | Admitting: Podiatry

## 2016-03-04 VITALS — BP 126/72 | HR 59 | Ht 62.5 in | Wt 153.0 lb

## 2016-03-04 DIAGNOSIS — R519 Headache, unspecified: Secondary | ICD-10-CM | POA: Insufficient documentation

## 2016-03-04 DIAGNOSIS — M542 Cervicalgia: Secondary | ICD-10-CM | POA: Diagnosis not present

## 2016-03-04 DIAGNOSIS — M779 Enthesopathy, unspecified: Secondary | ICD-10-CM

## 2016-03-04 DIAGNOSIS — R600 Localized edema: Secondary | ICD-10-CM

## 2016-03-04 DIAGNOSIS — H532 Diplopia: Secondary | ICD-10-CM | POA: Diagnosis not present

## 2016-03-04 DIAGNOSIS — G44209 Tension-type headache, unspecified, not intractable: Secondary | ICD-10-CM

## 2016-03-04 DIAGNOSIS — M79672 Pain in left foot: Secondary | ICD-10-CM | POA: Diagnosis not present

## 2016-03-04 DIAGNOSIS — R51 Headache: Secondary | ICD-10-CM

## 2016-03-04 MED ORDER — ALPRAZOLAM 1 MG PO TABS: 1.0000 mg | ORAL_TABLET | Freq: Every evening | ORAL | 0 refills | Status: DC | PRN
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
## 2016-03-05 LAB — ACETYLCHOLINE RECEPTOR, BINDING: AChR Binding Ab, Serum: <0.03 nmol/L (ref 0.00–0.24)

## 2016-03-05 NOTE — Progress Notes (Signed)
Subjective:     Patient ID: Maria Jackson, female   DOB: 1946/04/24, 70 y.o.   MRN: [id number]  HPI patient presents stating I'm still getting some swelling but it's somewhat better and also I have some pain on top of my foot   Review of Systems     Objective:   Physical Exam Neurovascular status intact muscle strength adequate with patient found to have continued discomfort dorsal left foot with mild edema which has improved some with +1 pitting and negative Homans sign with negative vascular testing done last week    Assessment:     Continued edematous process which has improved but is still present mild tendinitis    Plan:     Condition reviewed and at this time I went ahead and I recommended continued compression and applied and ankle stocking to provide support. Advised on elevation and reappoint if symptoms indicate

## 2016-03-10 DIAGNOSIS — Z0000 Encounter for general adult medical examination without abnormal findings: Secondary | ICD-10-CM | POA: Diagnosis not present

## 2016-03-10 DIAGNOSIS — Z1389 Encounter for screening for other disorder: Secondary | ICD-10-CM | POA: Diagnosis not present

## 2016-03-10 DIAGNOSIS — N390 Urinary tract infection, site not specified: Secondary | ICD-10-CM | POA: Diagnosis not present

## 2016-03-10 DIAGNOSIS — Z6827 Body mass index (BMI) 27.0-27.9, adult: Secondary | ICD-10-CM | POA: Diagnosis not present

## 2016-03-10 DIAGNOSIS — F0781 Postconcussional syndrome: Secondary | ICD-10-CM | POA: Diagnosis not present

## 2016-03-10 DIAGNOSIS — H532 Diplopia: Secondary | ICD-10-CM | POA: Diagnosis not present

## 2016-03-10 DIAGNOSIS — M859 Disorder of bone density and structure, unspecified: Secondary | ICD-10-CM | POA: Diagnosis not present

## 2016-03-10 DIAGNOSIS — M542 Cervicalgia: Secondary | ICD-10-CM | POA: Diagnosis not present

## 2016-03-10 DIAGNOSIS — R51 Headache: Secondary | ICD-10-CM | POA: Diagnosis not present

## 2016-03-10 DIAGNOSIS — Z23 Encounter for immunization: Secondary | ICD-10-CM | POA: Diagnosis not present

## 2016-03-11 DIAGNOSIS — Z1212 Encounter for screening for malignant neoplasm of rectum: Secondary | ICD-10-CM | POA: Diagnosis not present

## 2016-03-21 ENCOUNTER — Other Ambulatory Visit: Payer: Medicare Other

## 2016-04-21 ENCOUNTER — Encounter: Payer: Self-pay | Admitting: Gastroenterology

## 2016-04-26 ENCOUNTER — Encounter: Payer: Self-pay | Admitting: Gastroenterology

## 2016-06-01 DIAGNOSIS — M47816 Spondylosis without myelopathy or radiculopathy, lumbar region: Secondary | ICD-10-CM | POA: Diagnosis not present

## 2016-06-03 DIAGNOSIS — R921 Mammographic calcification found on diagnostic imaging of breast: Secondary | ICD-10-CM | POA: Diagnosis not present

## 2016-06-03 DIAGNOSIS — M810 Age-related osteoporosis without current pathological fracture: Secondary | ICD-10-CM | POA: Diagnosis not present

## 2016-06-08 ENCOUNTER — Ambulatory Visit: Payer: Medicare Other | Admitting: Neurology

## 2016-06-09 ENCOUNTER — Other Ambulatory Visit: Payer: Self-pay | Admitting: Radiology

## 2016-06-09 DIAGNOSIS — D0511 Intraductal carcinoma in situ of right breast: Secondary | ICD-10-CM | POA: Diagnosis not present

## 2016-06-09 DIAGNOSIS — R938 Abnormal findings on diagnostic imaging of other specified body structures: Secondary | ICD-10-CM | POA: Diagnosis not present

## 2016-06-09 DIAGNOSIS — C50919 Malignant neoplasm of unspecified site of unspecified female breast: Secondary | ICD-10-CM

## 2016-06-09 DIAGNOSIS — R921 Mammographic calcification found on diagnostic imaging of breast: Secondary | ICD-10-CM | POA: Diagnosis not present

## 2016-06-09 HISTORY — DX: Malignant neoplasm of unspecified site of unspecified female breast: C50.919

## 2016-06-10 ENCOUNTER — Ambulatory Visit (AMBULATORY_SURGERY_CENTER): Payer: Self-pay | Admitting: *Deleted

## 2016-06-10 VITALS — Ht 63.0 in | Wt 151.0 lb

## 2016-06-10 DIAGNOSIS — Z1212 Encounter for screening for malignant neoplasm of rectum: Secondary | ICD-10-CM

## 2016-06-10 DIAGNOSIS — Z1211 Encounter for screening for malignant neoplasm of colon: Secondary | ICD-10-CM

## 2016-06-10 MED ORDER — NA SULFATE-K SULFATE-MG SULF 17.5-3.13-1.6 GM/177ML PO SOLN: 1.0000 | Freq: Once | ORAL | 0 refills | Status: AC

## 2016-06-10 NOTE — Progress Notes (Signed)
No egg or soy allergy. No anesthesia problems.  No home O2.  No diet meds.  

## 2016-06-11 ENCOUNTER — Telehealth: Payer: Self-pay | Admitting: *Deleted

## 2016-06-11 ENCOUNTER — Encounter: Payer: Self-pay | Admitting: *Deleted

## 2016-06-11 DIAGNOSIS — Z170 Estrogen receptor positive status [ER+]: Secondary | ICD-10-CM | POA: Insufficient documentation

## 2016-06-11 DIAGNOSIS — C50311 Malignant neoplasm of lower-inner quadrant of right female breast: Secondary | ICD-10-CM

## 2016-06-11 HISTORY — DX: Malignant neoplasm of lower-inner quadrant of right female breast: C50.311

## 2016-06-11 NOTE — Telephone Encounter (Signed)
Confirmed [facility] for 06/16/16 at 1215 .  Instructions and contact information given.

## 2016-06-15 DIAGNOSIS — M47816 Spondylosis without myelopathy or radiculopathy, lumbar region: Secondary | ICD-10-CM | POA: Diagnosis not present

## 2016-06-15 DIAGNOSIS — M545 Low back pain: Secondary | ICD-10-CM | POA: Diagnosis not present

## 2016-06-15 DIAGNOSIS — M5136 Other intervertebral disc degeneration, lumbar region: Secondary | ICD-10-CM | POA: Diagnosis not present

## 2016-06-16 ENCOUNTER — Ambulatory Visit (HOSPITAL_BASED_OUTPATIENT_CLINIC_OR_DEPARTMENT_OTHER): Payer: Medicare Other | Admitting: Oncology

## 2016-06-16 ENCOUNTER — Other Ambulatory Visit (HOSPITAL_BASED_OUTPATIENT_CLINIC_OR_DEPARTMENT_OTHER): Payer: Medicare Other

## 2016-06-16 ENCOUNTER — Encounter: Payer: Self-pay | Admitting: Oncology

## 2016-06-16 ENCOUNTER — Ambulatory Visit: Payer: Medicare Other | Admitting: Physical Therapy

## 2016-06-16 ENCOUNTER — Ambulatory Visit
Admission: RE | Admit: 2016-06-16 | Discharge: 2016-06-16 | Disposition: A | Discharge status: Home / Self Care | Payer: Medicare Other | Source: Ambulatory Visit | Attending: Radiation Oncology | Admitting: Radiation Oncology

## 2016-06-16 VITALS — BP 149/64 | HR 66 | Temp 97.6°F | Resp 18 | Ht 63.0 in | Wt 151.0 lb

## 2016-06-16 DIAGNOSIS — Z170 Estrogen receptor positive status [ER+]: Secondary | ICD-10-CM

## 2016-06-16 DIAGNOSIS — M810 Age-related osteoporosis without current pathological fracture: Secondary | ICD-10-CM | POA: Diagnosis not present

## 2016-06-16 DIAGNOSIS — C50311 Malignant neoplasm of lower-inner quadrant of right female breast: Secondary | ICD-10-CM

## 2016-06-16 DIAGNOSIS — D0511 Intraductal carcinoma in situ of right breast: Secondary | ICD-10-CM | POA: Diagnosis not present

## 2016-06-16 LAB — CBC WITH DIFFERENTIAL/PLATELET
BASO%: 0.4 % (ref 0.0–2.0)
BASOS ABS: 0 10*3/uL (ref 0.0–0.1)
EOS ABS: 0 10*3/uL (ref 0.0–0.5)
EOS%: 0.7 % (ref 0.0–7.0)
HEMATOCRIT: 39.6 % (ref 34.8–46.6)
HEMOGLOBIN: 13.1 g/dL (ref 11.6–15.9)
LYMPH#: 1.1 10*3/uL (ref 0.9–3.3)
LYMPH%: 15.7 % (ref 14.0–49.7)
MCH: 34.1 pg — AB (ref 25.1–34.0)
MCHC: 33.1 g/dL (ref 31.5–36.0)
MCV: 102.8 fL — AB (ref 79.5–101.0)
MONO#: 1 10*3/uL — ABNORMAL HIGH (ref 0.1–0.9)
MONO%: 15.5 % — AB (ref 0.0–14.0)
NEUT#: 4.6 10*3/uL (ref 1.5–6.5)
NEUT%: 67.7 % (ref 38.4–76.8)
Platelets: 343 10*3/uL (ref 145–400)
RBC: 3.85 10*6/uL (ref 3.70–5.45)
RDW: 12.8 % (ref 11.2–14.5)
WBC: 6.7 10*3/uL (ref 3.9–10.3)

## 2016-06-16 LAB — COMPREHENSIVE METABOLIC PANEL
ALBUMIN: 3.5 g/dL (ref 3.5–5.0)
ALK PHOS: 74 U/L (ref 40–150)
ALT: 18 U/L (ref 0–55)
ANION GAP: 8 meq/L (ref 3–11)
AST: 22 U/L (ref 5–34)
BUN: 17.3 mg/dL (ref 7.0–26.0)
CALCIUM: 9.3 mg/dL (ref 8.4–10.4)
CHLORIDE: 103 meq/L (ref 98–109)
CO2: 27 mEq/L (ref 22–29)
Creatinine: 0.7 mg/dL (ref 0.6–1.1)
EGFR: 86 mL/min/{1.73_m2} — AB (ref >90)
Glucose: 83 mg/dl (ref 70–140)
POTASSIUM: 4.4 meq/L (ref 3.5–5.1)
Sodium: 138 mEq/L (ref 136–145)
Total Bilirubin: 0.66 mg/dL (ref 0.20–1.20)
Total Protein: 6.7 g/dL (ref 6.4–8.3)

## 2016-06-16 NOTE — Progress Notes (Signed)
Radiation Oncology         [phone]) [phone] ________________________________  Name: Maria Jackson MRN: [id number]  Date: 06/16/2016  DOB: 23-Dec-1945  [id number], Gwyndolyn Saxon, MD  Jovita Kussmaul, MD     REFERRING PHYSICIAN: Autumn Messing III, MD   DIAGNOSIS: The encounter diagnosis was Malignant neoplasm of lower-inner quadrant of right breast of female, estrogen receptor positive ([place]).  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 female who is seen for an initial consultation visit in breast [place] regarding the patient's diagnosis of DCIS of the right breast.  The patient originally presented with suspicious calcifications on screening mammogram. A diagnostic mammogram confirmed this finding.  Stereotactic biopsy of this area was performed on 06/09/16. This was positive for high grade DCIS with calcifications. (ER 95% +, PR 0% -).  The patient's husband was also present during this encounter.  PREVIOUS RADIATION THERAPY: No   PAST MEDICAL HISTORY:  has a past medical history of Allergy; Arthritis; Cancer ([place]) (11/05/11); Cataract; Chronic lower back pain; Concussion; Insomnia; Malignant neoplasm of lower-inner quadrant of right female breast ([place]) (06/11/2016); Osteopenia; Raynaud disease; Seasonal allergies; and Spondylolisthesis.     PAST SURGICAL HISTORY: Past Surgical History:  Procedure Laterality Date  . APPENDECTOMY    . CHOLECYSTECTOMY    . COLONOSCOPY    . ENDOSCOPIC VEIN LASER TREATMENT     calf riht and left  . FOOT NEUROMA SURGERY     bilateral  . JOINT REPLACEMENT    . TONSILLECTOMY    . TOTAL KNEE ARTHROPLASTY Left 04/27/2013   Procedure: LEFT TOTAL KNEE ARTHROPLASTY;  Surgeon: Gearlean Alf, MD;  Location:  [facility];  Service: Orthopedics;  Laterality: Left;  . TOTAL KNEE ARTHROPLASTY Right 08/27/2013   Procedure: RIGHT TOTAL KNEE ARTHROPLASTY;  Surgeon: Gearlean Alf, MD;  Location: [facility];  Service: Orthopedics;  Laterality: Right;  Marland Kitchen VAGINAL HYSTERECTOMY     tah/bso  . WISDOM TOOTH EXTRACTION       FAMILY HISTORY: family history includes Cancer (age of onset: 52) in her father; Diabetes in her mother; Heart disease in her father and mother.   SOCIAL HISTORY:  reports that she has never smoked. She has never used smokeless tobacco. She reports that she drinks about 1.2 oz of alcohol per week . She reports that she does not use drugs.   ALLERGIES: Cephalexin; Ibuprofen; Other; Penicillins; and Sulfonamide derivatives   MEDICATIONS:  Current Outpatient Prescriptions  Medication Sig Dispense Refill  . aspirin 81 MG tablet Take 81 mg by mouth daily.    . Cholecalciferol (VITAMIN D3 PO) Take 1,000 mg by mouth. Takes two capsules daily.     Marland Kitchen Lifitegrast (XIIDRA) 5 % SOLN Apply to eye 2 (two) times daily.    Marland Kitchen loratadine (CLARITIN) 10 MG tablet Take 10 mg by mouth as needed for allergies.    . Lutein 6 MG CAPS Take by mouth 2 (two) times daily.    . Magnesium 250 MG TABS Take 250 mg by mouth at bedtime.    . Multiple Vitamin (MULTIVITAMIN) tablet Take 1 tablet by mouth daily.    . nitrofurantoin (MACRODANTIN) 100 MG capsule Take 100 mg by mouth. After intercourse to  prevent UTI.    . Omega-3 Fatty Acids (FISH OIL) 1200 MG CAPS Take by mouth.    Marland Kitchen PREMARIN vaginal cream INSERT 0.5GRAMS INTRAVAGINALLY TWICE WEEKLY AS DIRECTED  5  . Probiotic Product (ALIGN) 4 MG CAPS Take by mouth.    . sodium chloride (OCEAN) 0.65 % nasal spray Place 1 spray into the nose 2 (two) times daily as needed for congestion.      No current facility-administered medications for this encounter.      REVIEW OF SYSTEMS:  A 15 point review of systems is documented in the electronic medical record. This was obtained  by the nursing staff. However, I reviewed this with the patient to discuss relevant findings and make appropriate changes.  Pertinent items noted in HPI and remainder of comprehensive ROS otherwise negative.  The patient reports wearing glasses, diplopia of the left eye, ringing of the ears, poor circulation of the lower legs, left breast pain that has been present for years that she attributes to muscle spasms, back pain, joint pain, and arthritis.  PHYSICAL EXAM:  vitals were not taken for this visit.  Vitals with BMI 06/16/2016  Height 5\' 3"   Weight 151 lbs  BMI 0000000  Systolic 123456  Diastolic 64  Pulse 66  Respirations 18  General: Well-developed, in no acute distress HEENT: Normocephalic, atraumatic; oral cavity clear Neck: Supple without any lymphadenopathy Cardiovascular: Regular rate and rhythm Respiratory: Clear to auscultation bilaterally Breasts: Biopsy site in the upper aspect of the right breast without significant ecchymosis. No palpable tumor present. Negative axilla on the right. Negative breast exam and axilla on the left. GI: Soft, nontender, normal bowel sounds Extremities: No edema present Neuro: No focal deficits  ECOG = 0  0 - Asymptomatic (Fully active, able to carry on all predisease activities without restriction)  1 - Symptomatic but completely ambulatory (Restricted in physically strenuous activity but ambulatory and able to carry out work of a light or sedentary nature. For example, light housework, office work)  2 - Symptomatic, <50% in bed during the day (Ambulatory and capable of all self care but unable to carry out any work activities. Up and about more than 50% of waking hours)  3 - Symptomatic, >50% in bed, but not bedbound (Capable of only limited self-care, confined to bed or chair 50% or more of waking hours)  4 - Bedbound (Completely disabled. Cannot carry on any self-care. Totally confined to bed or chair)  5 - Death   Eustace Pen MM, Creech RH,  Tormey DC, et al. [phone]). "Toxicity and response criteria of the [facility]". [place] Oncol. 5 (6): 649-55   LABORATORY DATA:  Lab Results  Component Value Date   WBC 6.7 06/16/2016   HGB 13.1 06/16/2016   HCT 39.6 06/16/2016   MCV 102.8 (H) 06/16/2016   PLT 343 06/16/2016   Lab Results  Component Value Date   NA 138 06/16/2016   K 4.4 06/16/2016   CL 92 (L) 08/29/2013   CO2 27 06/16/2016   Lab Results  Component Value Date   ALT 18 06/16/2016   AST 22 06/16/2016   ALKPHOS 74 06/16/2016   BILITOT 0.66 06/16/2016      RADIOGRAPHY: No results found.     IMPRESSION:  The patient has a recent diagnosis of DCIS of the right breast. She appears to be a good candidate for breast conservation treatment with a right lumpectomy.  I discussed with the patient the role of adjuvant  radiation treatment in this setting. We discussed the potential benefit of radiation treatment, especially with regards to local control of the patient's tumor. We also discussed the possible side effects and risks of such a treatment as well.  All of the patient's questions were answered. The patient wishes to proceed with radiation treatment at the appropriate time.  PLAN: I look forward to seeing the patient postoperatively, approximately 1 month after surgery, to review her case and further discuss and coordinate an anticipated course of radiation treatment. I anticipate 4 weeks of adjuvant radiation therapy.      ________________________________   [name], MD, PhD  This document serves as a record of services personally performed by Kyung Rudd, MD. It was created on his behalf by Darcus Austin, a trained medical scribe. The creation of this record is based on the scribe's personal observations and the provider's statements to them. This document has been checked and approved by the attending provider.

## 2016-06-16 NOTE — Progress Notes (Signed)
Nutrition Assessment  Reason for Assessment:  Pt seen in Breast Clinic  ASSESSMENT:   70 year old female with new diagnosis of right breast cancer Past medical history of osteopenia, osteoarthritis, raynaud's syndrome  Patient reports normal appetite  Medications:  reviewed  Labs: reviewed  Anthropometrics:   Height: 63 inches Weight: 151 lb BMI: 26.8   NUTRITION DIAGNOSIS: Food and nutrition related knowledge deficit related to new diagnosis of breast cancer as evidenced by no prior need for nutrition related information.  INTERVENTION:   Discussed and provided packet of information regarding nutritional tips for breast cancer patients.  Questions answered.  Teachback method used.      MONITORING, EVALUATION, and GOAL: Pt will consume a healthy plant based diet to maintain lean body mass throughout treatment.   Nakhia Levitan B. Zenia Resides, [place], [place] (pager)

## 2016-06-16 NOTE — Progress Notes (Signed)
[facility]  [phone] [phone]     ID: Maria Jackson Jackson DOB: 1946/01/03  MR#: [id number]  XUX#:[id number]  Patient Care Team: Marton Redwood, MD as PCP - General (Internal Medicine) Terrance Mass, MD (Obstetrics and Gynecology) Autumn Messing III, MD as Consulting Physician (General Surgery) Chauncey Cruel, MD as Consulting Physician (Oncology) Kyung Rudd, MD as Consulting Physician (Radiation Oncology) Melina Schools, MD as Consulting Physician (Orthopedic Surgery) Marcial Pacas, MD as Consulting Physician (Neurology) Wallene Huh, DPM as Consulting Physician (Podiatry) Manus Gunning, MD as Consulting Physician (Gastroenterology) Chauncey Cruel, MD OTHER MD:  CHIEF COMPLAINT: Ductal carcinoma in situ  CURRENT TREATMENT: Definitive surgery pending   BREAST CANCER HISTORY: Maria Jackson Jackson had suspicious right breast calcifications noted on her November 2016 mammography and this was followed up 11/12/2015 with a right diagnostic mammogram which again showed a 0.6 cm area of grouped vascular calcifications which appeared benign. On 06/03/2016 she underwent bilateral diagnostic mammography with special attention to the right breast, with tomosynthesis. The breast density was category B. In the right breast lower inner quadrant there was a group of linear calcifications now measuring 2 cm. Biopsy was performed, and showed (06/09/2016, [id number] [id number]) ductal carcinoma in situ, high-grade, estrogen receptor 95% positive, with strong staining intensity, just her and receptor negative.  The patient's subsequent history is as detailed below   INTERVAL HISTORY: Maria Jackson Jackson was evaluated in the multidisciplinary breast cancer clinic 06/16/2016 accompanied by her husband Maria Jackson Jackson. Her case was also presented in the multidisciplinary breast cancer conference that same morning. At that time a preliminary plan was proposed: Breast conserving surgery with no sentinel lymph  node sampling; adjuvant radiation recommended; consideration of anti-estrogens to follow  REVIEW OF SYSTEMS: There were no specific symptoms leading to the original mammogram, which was routinely scheduled. The patient denies unusual headaches, nausea, vomiting, stiff neck, dizziness, or gait imbalance. There has been no cough, phlegm production, or pleurisy, no chest pain or pressure, and no change in bowel or bladder habits. The patient denies fever, rash, bleeding, unexplained fatigue or unexplained weight loss. Maria Jackson Jackson admits to occasional diplopia which is being  followed by ophthalmology, occasional ankle swelling, and some back and joint pain which is not more intense or persistent than prior. A detailed review of systems was otherwise entirely negative.   PAST MEDICAL HISTORY: Past Medical History:  Diagnosis Date  . Allergy   . Arthritis    knees  . Cancer ([facility]) 11/05/11   SQUAMOS CELL CARCINOMA - LEFT CALF  . Cataract   . Chronic lower back pain   . Concussion   . Insomnia    NO LONGER AN ISSUE  . Malignant neoplasm of lower-inner quadrant of right female breast ([facility]) 06/11/2016  . Osteopenia   . Raynaud disease   . Seasonal allergies   . Spondylolisthesis    lumbar    PAST SURGICAL HISTORY: Past Surgical History:  Procedure Laterality Date  . APPENDECTOMY    . CHOLECYSTECTOMY    . COLONOSCOPY    . ENDOSCOPIC VEIN LASER TREATMENT     calf riht and left  . FOOT NEUROMA SURGERY     bilateral  . JOINT REPLACEMENT    . TONSILLECTOMY    . TOTAL KNEE ARTHROPLASTY Left 04/27/2013   Procedure: LEFT TOTAL KNEE ARTHROPLASTY;  Surgeon: Gearlean Alf, MD;  Location: [facility];  Service: Orthopedics;  Laterality: Left;  . TOTAL KNEE ARTHROPLASTY Right 08/27/2013   Procedure: RIGHT TOTAL KNEE ARTHROPLASTY;  Surgeon: Gearlean Alf, MD;  Location: [facility];  Service: Orthopedics;  Laterality: Right;  Marland Kitchen VAGINAL HYSTERECTOMY     tah/bso  . WISDOM TOOTH EXTRACTION      FAMILY  HISTORY Family History  Problem Relation Age of Onset  . Diabetes Mother   . Heart disease Mother   . Heart disease Father   . Cancer Father 51    MULTIPLE MYELOMA  . Colon cancer Neg Hx   . Esophageal cancer Neg Hx   . Stomach cancer Neg Hx   The patient's father died at the age of 29 with multiple myeloma. The patient's mother has a history of melanoma. She is 72 years old as of December 2017. The patient has one brother, no sisters. Her brother has a history of melanoma resection 2. There is no history of breast or ovarian cancer in the family to the patient's knowledge  GYNECOLOGIC HISTORY:  No LMP recorded. Patient has had a hysterectomy. Menarche age 38, first live birth age 16, the patient is Maria Jackson Jackson P2. 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 At home it's she and her husband Maria Jackson Jackson who is retired from Engineer, technical sales. Daughter Maria Jackson Jackson lives in [place] and is a Energy manager. Son Maria Jackson Jackson lives in [place] and he is a Marine scientist. The patient has a total of 4 grandchildren with one on the way    ADVANCED DIRECTIVES: In place including a living will   HEALTH MAINTENANCE: Social History  Substance Use Topics  . Smoking status: Never Smoker  . Smokeless tobacco: Never Used  . Alcohol use 1.2 oz/week    2 Glasses of wine per week     Comment: red wine...  1/3 GLASS at dinner     Colonoscopy:05/20/2011/R breast or  PAP:  Bone density: 06/03/2016 at [facility], T score -2.5   Allergies  Allergen Reactions  . Cephalexin     Unsure of reaction  . Ibuprofen     Mouth ulcers  . Other     BEE STINGS  . Penicillins     UNKNOWN  . Sulfonamide Derivatives Rash    Current Outpatient Prescriptions  Medication Sig Dispense  Refill  . aspirin 81 MG tablet Take 81 mg by mouth daily.    . Cholecalciferol (VITAMIN D3 PO) Take 1,000 mg by mouth. Takes two capsules daily.     Marland Kitchen Lifitegrast (XIIDRA) 5 % SOLN Apply to eye 2 (two) times daily.    Marland Kitchen loratadine (CLARITIN) 10 MG tablet Take 10 mg by mouth as needed for allergies.    . Lutein 6 MG CAPS Take by mouth 2 (two) times daily.    . Magnesium 250 MG TABS Take 250 mg by mouth at bedtime.    . Multiple Vitamin (MULTIVITAMIN) tablet Take 1 tablet by mouth daily.    . nitrofurantoin (MACRODANTIN) 100 MG capsule Take 100 mg by mouth. After intercourse to prevent UTI.    . Omega-3 Fatty Acids (FISH OIL) 1200 MG CAPS Take by mouth.    Marland Kitchen PREMARIN vaginal cream INSERT 0.5GRAMS INTRAVAGINALLY TWICE WEEKLY AS DIRECTED  5  . Probiotic Product (ALIGN) 4 MG CAPS Take by mouth.    . sodium chloride (OCEAN) 0.65 % nasal spray Place 1 spray into the nose  2 (two) times daily as needed for congestion.      No current facility-administered medications for this visit.     OBJECTIVE: Middle-aged white woman in no acute distress Vitals:   06/16/16 1251  BP: (!) 149/64  Pulse: 66  Resp: 18  Temp: 97.6 F (36.4 C)     Body mass index is 26.75 kg/m.    ECOG FS:1 - Symptomatic but completely ambulatory  Ocular: Sclerae unicteric, pupils equal, round and reactive to light Ear-nose-throat: Oropharynx clear and moist Lymphatic: No cervical or supraclavicular adenopathy Lungs no rales or rhonchi, good excursion bilaterally Heart regular rate and rhythm, no murmur appreciated Abd soft, nontender, positive bowel sounds MSK no focal spinal tenderness, no joint edema Neuro: non-focal, well-oriented, appropriate affect Breasts: The right breast is status post recent biopsy. I do not palpate any suspicious masses. The right axilla is benign. The left breast is unremarkable.   LAB RESULTS:  CMP     Component Value Date/Time   NA 138 06/16/2016 1236   K 4.4 06/16/2016 1236   CL 92  (L) 08/29/2013 0443   CO2 27 06/16/2016 1236   GLUCOSE 83 06/16/2016 1236   BUN 17.3 06/16/2016 1236   CREATININE 0.7 06/16/2016 1236   CALCIUM 9.3 06/16/2016 1236   PROT 6.7 06/16/2016 1236   ALBUMIN 3.5 06/16/2016 1236   AST 22 06/16/2016 1236   ALT 18 06/16/2016 1236   ALKPHOS 74 06/16/2016 1236   BILITOT 0.66 06/16/2016 1236   GFRNONAA 87 (L) 08/29/2013 0443   GFRAA >90 08/29/2013 0443    INo results found for: SPEP, UPEP  Lab Results  Component Value Date   WBC 6.7 06/16/2016   NEUTROABS 4.6 06/16/2016   HGB 13.1 06/16/2016   HCT 39.6 06/16/2016   MCV 102.8 (H) 06/16/2016   PLT 343 06/16/2016      Chemistry      Component Value Date/Time   NA 138 06/16/2016 1236   K 4.4 06/16/2016 1236   CL 92 (L) 08/29/2013 0443   CO2 27 06/16/2016 1236   BUN 17.3 06/16/2016 1236   CREATININE 0.7 06/16/2016 1236      Component Value Date/Time   CALCIUM 9.3 06/16/2016 1236   ALKPHOS 74 06/16/2016 1236   AST 22 06/16/2016 1236   ALT 18 06/16/2016 1236   BILITOT 0.66 06/16/2016 1236       No results found for: LABCA2  No components found for: LABCA125  No results for input(s): INR in the last 168 hours.  Urinalysis    Component Value Date/Time   COLORURINE YELLOW 08/22/2013 1357   APPEARANCEUR CLEAR 08/22/2013 1357   LABSPEC 1.004 (L) 08/22/2013 1357   PHURINE 7.0 08/22/2013 1357   GLUCOSEU NEGATIVE 08/22/2013 1357   HGBUR TRACE (A) 08/22/2013 1357   BILIRUBINUR NEGATIVE 08/22/2013 1357   KETONESUR NEGATIVE 08/22/2013 1357   PROTEINUR NEGATIVE 08/22/2013 1357   UROBILINOGEN 0.2 08/22/2013 1357   NITRITE NEGATIVE 08/22/2013 1357   LEUKOCYTESUR NEGATIVE 08/22/2013 1357     STUDIES: No results found.  ELIGIBLE FOR AVAILABLE RESEARCH PROTOCOL: No  ASSESSMENT: 70 y.o. [place], [place] woman status post right breast lower inner quadrant biopsy 06/03/2016 for ductal carcinoma in situ, high-grade, estrogen receptor positive, progesterone receptor  negative  (1) breast conserving surgery without sentinel lymph node sampling pending  (2) adjuvant radiation to follow as appropriate  (3) adjuvant anti-estrogens to be considered after completion of local treatment  (a) bone density 06/03/2016 at [facility] shows osteoporosis with  a T score of -2.5    PLAN: We spent the better part of today's hour-long appointment discussing the biology of breast cancer in general, and the specifics of the patient's tumor in particular. George understands that in noninvasive ductal carcinoma, also called ductal carcinoma in situ ("DCIS") the breast cancer cells remain trapped in the ducts were they started. They cannot travel to a vital organ. For that reason these cancers in themselves are not life-threatening.  If the whole breast is removed then all the ducts are removed and since the cancer cells are trapped in the ducts, the cure rate with mastectomy for noninvasive breast cancer is approximately 99%. Nevertheless we recommend lumpectomy, because there is no survival advantage to mastectomy and because the cosmetic result is generally superior with breast conservation.  Since the patient is keeping her breast, there will be some risk of recurrence. The recurrence can only be in the same breast since, again, the cells are trapped in the ducts. There is no connection from one breast to the other. The risk of local recurrence is cut by more than half with radiation, which is standard in this situation.  In estrogen receptor positive cancers like Aryana's's, anti-estrogens can also be considered. They will further reduce the risk of recurrence by one half. In addition anti-estrogens will lower the risk of a new breast cancer developing in either breast, also by one half. That risk approaches 1% per year. Anti-estrogens reduce it to 1/2%.  We also discussed Premarin supplementation. She is going off this at this point, but she would like to resume it if possible. I  think it would be feasible if she were on tamoxifen. Tamoxifen blocks the estrogen receptor and breast cancer cells. It does not "take away her estrogen". Accordingly if she uses vaginal estrogen preparations and somewhere absorbed, tamoxifen would still be effective.  The plan then is for her to start with surgery, meet with radiation oncology to make a definitive decision regarding radiation, and then return to see me in February, by which time she should be done with radiation treatments (if she opts against radiation I would move that appointment up of course). We would start tamoxifen at that point.  Briany has a good understanding of the overall plan. She agrees with it. She knows the goal of treatment in her case is cure. She will call with any problems that may develop before her next visit here.  Chauncey Cruel, MD   06/20/2016 12:51 PM Medical Oncology and Hematology [facility] [address] [place],  [address] Tel. [phone]    Fax. [phone]

## 2016-06-17 ENCOUNTER — Telehealth: Payer: Self-pay | Admitting: Gastroenterology

## 2016-06-17 ENCOUNTER — Ambulatory Visit: Payer: Self-pay | Admitting: General Surgery

## 2016-06-17 DIAGNOSIS — D0511 Intraductal carcinoma in situ of right breast: Secondary | ICD-10-CM

## 2016-06-17 NOTE — Progress Notes (Signed)
ONCBCN DISTRESS SCREENING 06/17/2016  Screening Type Initial Screening  Distress experienced in past week (1-10) 4  Emotional problem type Adjusting to illness  Referral to support programs Yes   Met with patient in Breast Multidisciplinary Clinic to introduce [facility] team/resources, reviewing distress screen per protocol.  The patient scored a 4 on the Psychosocial Distress Thermometer which indicates moderate distress. Also assessed for distress and other psychosocial needs.  Patient attended clinic with her husband. Pt shared how attending clinic had provided her with answers to her questions and that she was experiencing more peace. Pt and her husband are healthy eaters and will likely not have to make significant changes to their diet as a result of the cancer. Pt expressed minor distress over having to go through radiation but seemed resolved to the treatment plan. Counselor shared details about the [facility] programming and services; pt seemed unfamiliar with counseling but expressed curiosity and said that she would reach out if she felt overwhelmed at any point throughout treatment.  Rosana Fret, Counseling Mudlogger, Lorrin Jackson, Chaplain

## 2016-06-20 DIAGNOSIS — M810 Age-related osteoporosis without current pathological fracture: Secondary | ICD-10-CM | POA: Insufficient documentation

## 2016-06-21 ENCOUNTER — Telehealth: Payer: Self-pay | Admitting: *Deleted

## 2016-06-21 NOTE — Telephone Encounter (Signed)
Spoke with pt. She may need to reschedule procedure. She has been diagnosed with an early stage of breast cancer. She will be having radiation with possible surgery in Jan. Pt would like to know if she should cancel surgery or reschedule? If she needs to reschedule when should she be scheduled. She does not follow up with oncologist until the end of Feb. We will put pt on sample list for Suprep when she does r/s. The prep is $102 with [employer].

## 2016-06-21 NOTE — Telephone Encounter (Signed)
Okay; thanks.

## 2016-06-21 NOTE — Telephone Encounter (Signed)
If she can't afford the prep, please speak with Magda Paganini and she may be able to get a free prep. If that is not possible we can consider a miralax prep. If she has issues with the timing of this procedure in relation to her surgery we can reschedule, however no reason she can't have colonoscopy if it's before the breast surgery. It's up to the patient, she can reschedule a few months later if she wishes, either way is fine. Thanks

## 2016-06-21 NOTE — Telephone Encounter (Signed)
Pt wishes to wait until March to have colon. Recall placed. We will put her on the sample list for suprep when she gets rescheduled.

## 2016-06-21 NOTE — Telephone Encounter (Signed)
  Oncology Nurse Navigator Documentation  Navigator Location: [facility] (06/21/16 1500)   )Navigator Encounter Type: Telephone (06/21/16 1500) Telephone: Outgoing Call;Clinic/[facility]C Follow-up (06/21/16 1500)                                                  Time Spent with Patient: 15 (06/21/16 1500)

## 2016-06-24 ENCOUNTER — Encounter: Payer: Medicare Other | Admitting: Gastroenterology

## 2016-07-02 ENCOUNTER — Encounter: Payer: Self-pay | Admitting: Radiation Oncology

## 2016-07-06 ENCOUNTER — Encounter: Payer: Self-pay | Admitting: Radiation Oncology

## 2016-07-06 NOTE — Progress Notes (Addendum)
Location of Breast Cancer: Right  Lower Inner Quadrant (DCIS)  Histology per Pathology Report:06/09/2016: Diagnosis Breast, right, needle core biopsy, calcs - DUCTAL CARCINOMA IN SITU WITH CALCIFICATIONS Receptor Status: ER(95%+), PR ( 0%neg), Her2-neu (), Ki-()  Did patient present with symptoms (if so, please note symptoms) or was this found on screening mammography?: Routine screening   Past/Anticipated interventions by surgeon, if any: Dr. Autumn Messing, III,MD,  07/14/2016 surgery scheduled, lumpectomy, [facility] 07/12/16 radiaoactive seed implant right breast  Past/Anticipated interventions by medical oncology, if any: Chemotherapy:Dr. Magrinat,    Lymphedema issues, if any:   NO  Pain issues, if any: Chronic low back pain, none at present  No SAFETY ISSUES:  Prior radiation? NO  Pacemaker/ICD? NO  Possible current pregnancy?N/A  Is the patient on methotrexate? NO  Current Complaints / other details:  Married,, menarache age 71,1st live birth age 22, GXP80,  Used premarin from age 50 to dx breast cancer;  Seen in multidisciplinary clinic 06/16/16, HX Squamous cell lateral lower leg 2013,    Non smoker,non tobacco use, no illicit drug use, occasional alcohol  1 per week Father cancer=Multiple myeloma, died age 46, ,heart disease,mother heart disease,DM , hx melanoma 17 living,brother hx melanoma resection x2,  BP (!) 153/68 (BP Location: Left Arm, Patient Position: Sitting, Cuff Size: Normal)   Pulse 68   Temp 98 F (36.7 C) (Oral)   Resp 16   Ht 5' 3"  (1.6 m)   Wt 151 lb (68.5 kg)   BMI 26.75 kg/m   Wt Readings from Last 3 Encounters:  07/08/16 151 lb (68.5 kg)  07/08/16 152 lb 1.6 oz (69 kg)  06/16/16 151 lb (68.5 kg)      Rebecca Eaton, RN 07/06/2016,9:26 AM

## 2016-07-07 ENCOUNTER — Encounter: Payer: Self-pay | Admitting: Radiation Oncology

## 2016-07-08 ENCOUNTER — Encounter: Payer: Self-pay | Admitting: Radiation Oncology

## 2016-07-08 ENCOUNTER — Ambulatory Visit
Admission: RE | Admit: 2016-07-08 | Discharge: 2016-07-08 | Disposition: A | Discharge status: Home / Self Care | Payer: Medicare Other | Source: Ambulatory Visit | Attending: Radiation Oncology | Admitting: Radiation Oncology

## 2016-07-08 ENCOUNTER — Encounter (HOSPITAL_COMMUNITY): Payer: Self-pay | Admitting: *Deleted

## 2016-07-08 ENCOUNTER — Encounter (HOSPITAL_COMMUNITY)
Admission: RE | Admit: 2016-07-08 | Discharge: 2016-07-08 | Disposition: A | Discharge status: Home / Self Care | Payer: Medicare Other | Source: Ambulatory Visit | Attending: General Surgery | Admitting: General Surgery

## 2016-07-08 DIAGNOSIS — D0511 Intraductal carcinoma in situ of right breast: Secondary | ICD-10-CM | POA: Diagnosis not present

## 2016-07-08 DIAGNOSIS — Z170 Estrogen receptor positive status [ER+]: Secondary | ICD-10-CM

## 2016-07-08 DIAGNOSIS — I7300 Raynaud's syndrome without gangrene: Secondary | ICD-10-CM | POA: Diagnosis not present

## 2016-07-08 DIAGNOSIS — Z01812 Encounter for preprocedural laboratory examination: Secondary | ICD-10-CM | POA: Insufficient documentation

## 2016-07-08 DIAGNOSIS — C50311 Malignant neoplasm of lower-inner quadrant of right female breast: Secondary | ICD-10-CM | POA: Insufficient documentation

## 2016-07-08 DIAGNOSIS — M8580 Other specified disorders of bone density and structure, unspecified site: Secondary | ICD-10-CM | POA: Insufficient documentation

## 2016-07-08 DIAGNOSIS — Z510 Encounter for antineoplastic radiation therapy: Secondary | ICD-10-CM | POA: Diagnosis present

## 2016-07-08 DIAGNOSIS — Z96653 Presence of artificial knee joint, bilateral: Secondary | ICD-10-CM | POA: Insufficient documentation

## 2016-07-08 DIAGNOSIS — Z880 Allergy status to penicillin: Secondary | ICD-10-CM | POA: Diagnosis not present

## 2016-07-08 DIAGNOSIS — Z882 Allergy status to sulfonamides status: Secondary | ICD-10-CM | POA: Insufficient documentation

## 2016-07-08 HISTORY — DX: Malignant neoplasm of unspecified site of unspecified female breast: C50.919

## 2016-07-08 LAB — BASIC METABOLIC PANEL
ANION GAP: 7 (ref 5–15)
BUN: 13 mg/dL (ref 6–20)
CHLORIDE: 101 mmol/L (ref 101–111)
CO2: 30 mmol/L (ref 22–32)
Calcium: 9.4 mg/dL (ref 8.9–10.3)
Creatinine, Ser: 0.76 mg/dL (ref 0.44–1.00)
GFR calc Af Amer: >60 mL/min (ref >60)
GFR calc non Af Amer: >60 mL/min (ref >60)
GLUCOSE: 107 mg/dL — AB (ref 65–99)
POTASSIUM: 4.2 mmol/L (ref 3.5–5.1)
Sodium: 138 mmol/L (ref 135–145)

## 2016-07-08 NOTE — Progress Notes (Signed)
Please see the Nurse Progress Note in the MD Initial Consult Encounter for this patient. 

## 2016-07-08 NOTE — Pre-Procedure Instructions (Signed)
    VIOLETTA DIMON  07/08/2016     Your procedure is scheduled on Wednesday, January 10.  Report to [facility] Admitting at 8:20 AM                  Your surgery or procedure is scheduled for 10:20 AM   Call this number if you have problems the morning of surgery:      Remember:  Do not eat food or drink liquids after midnight Tuesday, January 9.  Take these medicines the morning of surgery with A SIP OF WATER : Claratin if needed.  May use Nasocort if needed.                  1 Week prior to surgery STOP taking Aspirin, Aspirin Products (Goody Powder, Excedrin Migraine), Ibuprofen (Advil), Naproxen (Aleve), Vitamins and Herbal Products (ie Fish Oil)   Do not wear jewelry, make-up or nail polish.  Do not wear lotions, powders, or perfumes, or deodorant.  Do not shave 48 hours prior to surgery.    Do not bring valuables to the hospital.  [facility] is not responsible for any belongings or valuables.  Contacts, dentures or bridgework may not be worn into surgery.  Leave your suitcase in the car.  After surgery it may be brought to your room.  For patients admitted to the hospital, discharge time will be determined by your treatment team.  Patients discharged the day of surgery will not be allowed to drive home.   Name and phone number of your driver:   -  Special instructions:  Review  [facility] - Preparing For Surgery.  Review Handouts that were given you: [facility]- Preparing For Surgery, Coughing and Deep Breathing, Pain Booklet

## 2016-07-08 NOTE — Progress Notes (Signed)
Radiation Oncology         [phone] ________________________________  Name: Maria Jackson MRN: [id number]  Date: 07/08/2016  DOB: [birth year]  DJ:TTSV, Gwyndolyn Saxon, MD  Magrinat, Virgie Dad, MD     REFERRING PHYSICIAN: Magrinat, Virgie Dad, MD   DIAGNOSIS: The encounter diagnosis was Malignant neoplasm of lower-inner quadrant of right breast of female, estrogen receptor positive ([place]).  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 female who was seen by Dr. Lisbeth Renshaw on 06/16/16 in [facility] breast clinic for a new diagnosis of right breast cancer. The patient originally presented with suspicious calcifications on screening mammogram. A diagnostic mammogram confirmed this finding. Stereotactic biopsy of this area was performed on 06/09/16. This was positive for high grade DCIS with calcifications. (ER 95% +, PR 0% -). She was offered lumpectomy with probable sentinel node mapping, adjuvant radiotherapy and antiestrogen therapy. She comes today in anticipation of radiation following her lumpectomy which is scheduled for 07/14/16.  PREVIOUS RADIATION THERAPY: No   PAST MEDICAL HISTORY:  Past Medical History:  Diagnosis Date  . Allergy   . Arthritis    knees  . Breast cancer ([place]) 06/09/2016   right DCIS  . Cancer ([place]) 11/05/11   SQUAMOS CELL CARCINOMA - LEFT CALF  . Cataract   . Chronic lower back pain   . Concussion   . Diplopia    left eye - when she looks down  . Head injury, closed, with concussion   . Insomnia    NO LONGER AN ISSUE  . Malignant neoplasm of lower-inner quadrant of right female breast ([place]) 06/11/2016  . Osteopenia   . Raynaud disease   . Seasonal allergies   . Spondylolisthesis      lumbar      PAST SURGICAL HISTORY: Past Surgical History:  Procedure Laterality Date  . APPENDECTOMY    . CHOLECYSTECTOMY    . COLONOSCOPY    . ENDOSCOPIC VEIN LASER TREATMENT     calf riht and left  . FOOT NEUROMA SURGERY     bilateral  . JOINT REPLACEMENT    . TONSILLECTOMY    . TOTAL KNEE ARTHROPLASTY Left 04/27/2013   Procedure: LEFT TOTAL KNEE ARTHROPLASTY;  Surgeon: Gearlean Alf, MD;  Location: [facility];  Service: Orthopedics;  Laterality: Left;  . TOTAL KNEE ARTHROPLASTY Right 08/27/2013   Procedure: RIGHT TOTAL KNEE ARTHROPLASTY;  Surgeon: Gearlean Alf, MD;  Location: [facility];  Service: Orthopedics;  Laterality: Right;  . TUBAL LIGATION    . VAGINAL HYSTERECTOMY     tah/bso  . WISDOM TOOTH EXTRACTION       FAMILY HISTORY:  Family History  Problem Relation Age of Onset  . Diabetes Mother     CVA - 07/06/16  . Heart disease Mother   . Heart disease Father     Cancer of eye, Multiple Mylemona  . Colon cancer Neg Hx   . Esophageal cancer Neg Hx   . Stomach cancer Neg Hx      SOCIAL HISTORY:  reports that she has never smoked. She has never used smokeless tobacco. She reports that she drinks about 1.2 oz of alcohol per week . She reports  that she does not use drugs. The patient is married and resides in [place], [place].    ALLERGIES: Cephalexin; Ibuprofen; Other; Penicillins; and Sulfonamide derivatives   MEDICATIONS:  Current Outpatient Prescriptions  Medication Sig Dispense Refill  . aspirin 81 MG tablet Take 81 mg by mouth daily.    . cholecalciferol (VITAMIN D) 1000 units tablet Take 2,000 Units by mouth daily.    Marland Kitchen Lifitegrast (XIIDRA) 5 % SOLN Place 1 drop into both eyes 2 (two) times daily.     . Lutein 6 MG CAPS Take 6 mg by mouth 2 (two) times daily.     . Magnesium 250 MG TABS Take 250 mg by mouth 2 (two) times daily.     . Omega-3 Fatty Acids (FISH OIL) 1200 MG CAPS Take 1,200 mg by mouth daily.    . Probiotic Product (ALIGN) 4 MG CAPS Take 4 mg  by mouth at bedtime.    . sodium chloride (OCEAN) 0.65 % nasal spray Place 1 spray into the nose 2 (two) times daily as needed for congestion.     . Vaginal Lubricant (REPLENS VA) Place 1 application vaginally every 3 (three) days.    Marland Kitchen azithromycin (ZITHROMAX) 500 MG tablet Take 500 mg by mouth as directed. 500 mg by mouth 1 hour prior to dental work.    . diphenhydrAMINE (BENADRYL) 25 mg capsule Take 25 mg by mouth every 6 (six) hours as needed (for congestion/allergies.).    Marland Kitchen loratadine (CLARITIN) 10 MG tablet Take 10 mg by mouth as needed for allergies.    . Multiple Vitamin (MULTIVITAMIN WITH MINERALS) TABS tablet Take 1 tablet by mouth daily. Centrum Silver for Women    . nitrofurantoin, macrocrystal-monohydrate, (MACROBID) 100 MG capsule Take 100 mg by mouth daily as needed (for UTI prevention).    . triamcinolone (NASACORT AQ) 55 MCG/ACT AERO nasal inhaler Place 2 sprays into the nose daily as needed (for allergies.).     No current facility-administered medications for this encounter.      REVIEW OF SYSTEMS:  On review of systems, the patient reports that she is doing well overall. She denies any chest pain, shortness of breath, cough, fevers, chills, night sweats, unintended weight changes. She denies any bowel or bladder disturbances, and denies abdominal pain, nausea or vomiting. She denies any new musculoskeletal or joint aches or pains. A complete review of systems is obtained and is otherwise negative.   PHYSICAL EXAM:  height is 5' 3"  (1.6 m) and weight is 151 lb (68.5 kg). Her oral temperature is 98 F (36.7 C). Her blood pressure is 153/68 (abnormal) and her pulse is 68. Her respiration is 16.   In general this is a well appearing caucasian female in no acute distress. She's alert and oriented x4 and appropriate throughout the examination. Cardiopulmonary assessment is negative for acute distress and she exhibits normal effort.    ECOG = 0  0 - Asymptomatic (Fully active,  able to carry on all predisease activities without restriction)  1 - Symptomatic but completely ambulatory (Restricted in physically strenuous activity but ambulatory and able to carry out work of a light or sedentary nature. For example, light housework, office work)  2 - Symptomatic, <50% in bed during the day (Ambulatory and capable of all self care but unable to carry out any work activities. Up and about more than 50% of waking hours)  3 - Symptomatic, >50% in bed, but not bedbound (Capable of only limited self-care, confined to bed or  chair 50% or more of waking hours)  4 - Bedbound (Completely disabled. Cannot carry on any self-care. Totally confined to bed or chair)  5 - Death   Eustace Pen MM, Creech RH, Tormey DC, et al. [phone]). "Toxicity and response criteria of the [facility]". [place] Oncol. 5 (6): 649-55   LABORATORY DATA:  Lab Results  Component Value Date   WBC 6.7 06/16/2016   HGB 13.1 06/16/2016   HCT 39.6 06/16/2016   MCV 102.8 (H) 06/16/2016   PLT 343 06/16/2016   Lab Results  Component Value Date   NA 138 07/08/2016   K 4.2 07/08/2016   CL 101 07/08/2016   CO2 30 07/08/2016   Lab Results  Component Value Date   ALT 18 06/16/2016   AST 22 06/16/2016   ALKPHOS 74 06/16/2016   BILITOT 0.66 06/16/2016      RADIOGRAPHY: No results found.     IMPRESSION/PLAN:  1.  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 I met with the patient in our clinic today and reviewed the recommendations from Dr. Lisbeth Renshaw when the patient was seen in the multidisciplinary breast cancer clinic. We discussed the pathology findings and reviews the nature of in situe disease. The consensus from the breast conference include breast conservation with lumpectomy with  sentinel mapping given the high grade features on her pathology. Provided that invasive disease is not noted at the time of surgery, and provided chemotherapy is not indicated, the patient's course  would then be followed by external radiotherapy to the breast followed by antiestrogen therapy. We discussed the risks, benefits, short, and long term effects of radiotherapy, and the patient is interested in proceeding. We discussed the delivery and logistics of radiotherapy. We will see her back for simulation on 07/30/16, and this was scheduled. She will review consent that day with Dr. Lisbeth Renshaw, and her skin will be evaluated by him during the planning session.   In a visit lasting 25 minutes, greater than 50% of the time was spent face to face discussing the role and utility of radiotherapy, and coordinating the patient's care.    Carola Rhine, PAC

## 2016-07-12 ENCOUNTER — Encounter: Payer: Medicare Other | Admitting: Gastroenterology

## 2016-07-12 DIAGNOSIS — C50811 Malignant neoplasm of overlapping sites of right female breast: Secondary | ICD-10-CM | POA: Diagnosis not present

## 2016-07-12 DIAGNOSIS — C50911 Malignant neoplasm of unspecified site of right female breast: Secondary | ICD-10-CM | POA: Diagnosis not present

## 2016-07-13 DIAGNOSIS — R51 Headache: Secondary | ICD-10-CM | POA: Diagnosis not present

## 2016-07-13 DIAGNOSIS — H40013 Open angle with borderline findings, low risk, bilateral: Secondary | ICD-10-CM | POA: Diagnosis not present

## 2016-07-13 DIAGNOSIS — H5021 Vertical strabismus, right eye: Secondary | ICD-10-CM | POA: Diagnosis not present

## 2016-07-13 DIAGNOSIS — H532 Diplopia: Secondary | ICD-10-CM | POA: Diagnosis not present

## 2016-07-13 DIAGNOSIS — H04123 Dry eye syndrome of bilateral lacrimal glands: Secondary | ICD-10-CM | POA: Diagnosis not present

## 2016-07-14 ENCOUNTER — Encounter (HOSPITAL_COMMUNITY)
Admission: RE | Disposition: A | Discharge status: Home / Self Care | Payer: Self-pay | Source: Ambulatory Visit | Attending: General Surgery

## 2016-07-14 ENCOUNTER — Ambulatory Visit (HOSPITAL_COMMUNITY): Payer: Medicare Other | Admitting: Certified Registered"

## 2016-07-14 ENCOUNTER — Encounter (HOSPITAL_COMMUNITY): Payer: Self-pay | Admitting: Critical Care Medicine

## 2016-07-14 ENCOUNTER — Encounter: Payer: Medicare Other | Admitting: Gastroenterology

## 2016-07-14 ENCOUNTER — Ambulatory Visit (HOSPITAL_COMMUNITY)
Admission: RE | Admit: 2016-07-14 | Discharge: 2016-07-14 | Disposition: A | Discharge status: Home / Self Care | Payer: Medicare Other | Source: Ambulatory Visit | Attending: General Surgery | Admitting: General Surgery

## 2016-07-14 DIAGNOSIS — D0511 Intraductal carcinoma in situ of right breast: Secondary | ICD-10-CM | POA: Insufficient documentation

## 2016-07-14 DIAGNOSIS — Z808 Family history of malignant neoplasm of other organs or systems: Secondary | ICD-10-CM | POA: Diagnosis not present

## 2016-07-14 DIAGNOSIS — Z833 Family history of diabetes mellitus: Secondary | ICD-10-CM | POA: Insufficient documentation

## 2016-07-14 DIAGNOSIS — Z79810 Long term (current) use of selective estrogen receptor modulators (SERMs): Secondary | ICD-10-CM | POA: Diagnosis not present

## 2016-07-14 DIAGNOSIS — Z8349 Family history of other endocrine, nutritional and metabolic diseases: Secondary | ICD-10-CM | POA: Diagnosis not present

## 2016-07-14 DIAGNOSIS — Z9889 Other specified postprocedural states: Secondary | ICD-10-CM | POA: Diagnosis not present

## 2016-07-14 DIAGNOSIS — Z8261 Family history of arthritis: Secondary | ICD-10-CM | POA: Diagnosis not present

## 2016-07-14 DIAGNOSIS — Z9049 Acquired absence of other specified parts of digestive tract: Secondary | ICD-10-CM | POA: Diagnosis not present

## 2016-07-14 DIAGNOSIS — Z90710 Acquired absence of both cervix and uterus: Secondary | ICD-10-CM | POA: Diagnosis not present

## 2016-07-14 DIAGNOSIS — Z170 Estrogen receptor positive status [ER+]: Secondary | ICD-10-CM | POA: Diagnosis not present

## 2016-07-14 DIAGNOSIS — C50811 Malignant neoplasm of overlapping sites of right female breast: Secondary | ICD-10-CM | POA: Diagnosis not present

## 2016-07-14 DIAGNOSIS — M179 Osteoarthritis of knee, unspecified: Secondary | ICD-10-CM | POA: Diagnosis not present

## 2016-07-14 DIAGNOSIS — Z8249 Family history of ischemic heart disease and other diseases of the circulatory system: Secondary | ICD-10-CM | POA: Diagnosis not present

## 2016-07-14 DIAGNOSIS — R51 Headache: Secondary | ICD-10-CM | POA: Diagnosis not present

## 2016-07-14 DIAGNOSIS — C50911 Malignant neoplasm of unspecified site of right female breast: Secondary | ICD-10-CM | POA: Diagnosis not present

## 2016-07-14 DIAGNOSIS — K5900 Constipation, unspecified: Secondary | ICD-10-CM | POA: Diagnosis not present

## 2016-07-14 DIAGNOSIS — Z823 Family history of stroke: Secondary | ICD-10-CM | POA: Insufficient documentation

## 2016-07-14 DIAGNOSIS — C50311 Malignant neoplasm of lower-inner quadrant of right female breast: Secondary | ICD-10-CM | POA: Diagnosis not present

## 2016-07-14 HISTORY — DX: Concussion with loss of consciousness of unspecified duration, initial encounter: S06.0X9A

## 2016-07-14 HISTORY — PX: BREAST LUMPECTOMY WITH RADIOACTIVE SEED LOCALIZATION: SHX6424

## 2016-07-14 HISTORY — DX: Diplopia: H53.2

## 2016-07-14 HISTORY — DX: Concussion with loss of consciousness status unknown, initial encounter: S06.0XAA

## 2016-07-14 LAB — CBC
HCT: 40.1 % (ref 36.0–46.0)
Hemoglobin: 13.7 g/dL (ref 12.0–15.0)
MCH: 34.3 pg — AB (ref 26.0–34.0)
MCHC: 34.2 g/dL (ref 30.0–36.0)
MCV: 100.5 fL — ABNORMAL HIGH (ref 78.0–100.0)
PLATELETS: 338 10*3/uL (ref 150–400)
RBC: 3.99 MIL/uL (ref 3.87–5.11)
RDW: 13.1 % (ref 11.5–15.5)
WBC: 5.5 10*3/uL (ref 4.0–10.5)

## 2016-07-14 SURGERY — BREAST LUMPECTOMY WITH RADIOACTIVE SEED LOCALIZATION
Anesthesia: General | Site: Breast | Laterality: Right

## 2016-07-14 MED ORDER — LIDOCAINE HCL (CARDIAC) 20 MG/ML IV SOLN
INTRAVENOUS | Status: DC | PRN
  Administered 2016-07-14: 50 mg via INTRAVENOUS

## 2016-07-14 MED ORDER — LIDOCAINE 2% (20 MG/ML) 5 ML SYRINGE
INTRAMUSCULAR | Status: AC
  Filled 2016-07-14: qty 5

## 2016-07-14 MED ORDER — HYDROCODONE-ACETAMINOPHEN 5-325 MG PO TABS: 1.0000 | ORAL_TABLET | ORAL | 0 refills | Status: DC | PRN

## 2016-07-14 MED ORDER — FENTANYL CITRATE (PF) 100 MCG/2ML IJ SOLN
INTRAMUSCULAR | Status: AC
  Filled 2016-07-14: qty 2

## 2016-07-14 MED ORDER — PROPOFOL 10 MG/ML IV BOLUS
INTRAVENOUS | Status: AC
  Filled 2016-07-14: qty 20

## 2016-07-14 MED ORDER — HYDROMORPHONE HCL 1 MG/ML IJ SOLN
INTRAMUSCULAR | Status: AC
  Filled 2016-07-14: qty 0.5

## 2016-07-14 MED ORDER — PROPOFOL 10 MG/ML IV BOLUS
INTRAVENOUS | Status: DC | PRN
  Administered 2016-07-14: 160 mg via INTRAVENOUS

## 2016-07-14 MED ORDER — ONDANSETRON HCL 4 MG/2ML IJ SOLN
INTRAMUSCULAR | Status: AC
  Filled 2016-07-14: qty 2

## 2016-07-14 MED ORDER — BUPIVACAINE HCL 0.25 % IJ SOLN
INTRAMUSCULAR | Status: DC | PRN
  Administered 2016-07-14: 10 mL

## 2016-07-14 MED ORDER — PROMETHAZINE HCL 25 MG/ML IJ SOLN
INTRAMUSCULAR | Status: AC
  Filled 2016-07-14: qty 1

## 2016-07-14 MED ORDER — ONDANSETRON HCL 4 MG/2ML IJ SOLN
INTRAMUSCULAR | Status: DC | PRN
  Administered 2016-07-14: 4 mg via INTRAVENOUS

## 2016-07-14 MED ORDER — MIDAZOLAM HCL 2 MG/2ML IJ SOLN
INTRAMUSCULAR | Status: AC
  Filled 2016-07-14: qty 2

## 2016-07-14 MED ORDER — BUPIVACAINE HCL (PF) 0.25 % IJ SOLN
INTRAMUSCULAR | Status: AC
  Filled 2016-07-14: qty 30

## 2016-07-14 MED ORDER — HYDROMORPHONE HCL 1 MG/ML IJ SOLN
0.2500 mg | INTRAMUSCULAR | Status: DC | PRN
  Administered 2016-07-14 (×4): 0.5 mg via INTRAVENOUS

## 2016-07-14 MED ORDER — HYDROMORPHONE HCL 1 MG/ML IJ SOLN
INTRAMUSCULAR | Status: AC
  Filled 2016-07-14: qty 1

## 2016-07-14 MED ORDER — PROMETHAZINE HCL 25 MG/ML IJ SOLN: 6.2500 mg | INTRAMUSCULAR | Status: DC | PRN

## 2016-07-14 MED ORDER — CHLORHEXIDINE GLUCONATE CLOTH 2 % EX PADS: 6.0000 | MEDICATED_PAD | Freq: Once | CUTANEOUS | Status: DC

## 2016-07-14 MED ORDER — LACTATED RINGERS IV SOLN
INTRAVENOUS | Status: DC
  Administered 2016-07-14: 10:00:00 via INTRAVENOUS

## 2016-07-14 MED ORDER — HYDROCODONE-ACETAMINOPHEN 5-325 MG PO TABS
ORAL_TABLET | ORAL | Status: AC
  Administered 2016-07-14: 1
  Filled 2016-07-14: qty 1

## 2016-07-14 MED ORDER — VANCOMYCIN HCL IN DEXTROSE 1-5 GM/200ML-% IV SOLN
INTRAVENOUS | Status: AC
  Filled 2016-07-14: qty 200

## 2016-07-14 MED ORDER — DEXAMETHASONE SODIUM PHOSPHATE 10 MG/ML IJ SOLN
INTRAMUSCULAR | Status: AC
  Filled 2016-07-14: qty 1

## 2016-07-14 MED ORDER — FENTANYL CITRATE (PF) 100 MCG/2ML IJ SOLN
INTRAMUSCULAR | Status: DC | PRN
  Administered 2016-07-14 (×4): 25 ug via INTRAVENOUS

## 2016-07-14 MED ORDER — VANCOMYCIN HCL IN DEXTROSE 1-5 GM/200ML-% IV SOLN
1000.0000 mg | INTRAVENOUS | Status: AC
  Administered 2016-07-14: 1000 mg via INTRAVENOUS

## 2016-07-14 MED ORDER — 0.9 % SODIUM CHLORIDE (POUR BTL) OPTIME
TOPICAL | Status: DC | PRN
  Administered 2016-07-14: 1000 mL

## 2016-07-14 MED ORDER — MIDAZOLAM HCL 5 MG/5ML IJ SOLN
INTRAMUSCULAR | Status: DC | PRN
  Administered 2016-07-14: 2 mg via INTRAVENOUS

## 2016-07-14 MED ORDER — DEXAMETHASONE SODIUM PHOSPHATE 4 MG/ML IJ SOLN
INTRAMUSCULAR | Status: DC | PRN
  Administered 2016-07-14: 4 mg via INTRAVENOUS

## 2016-07-14 SURGICAL SUPPLY — 39 items
ADH SKN CLS APL DERMABOND .7 (GAUZE/BANDAGES/DRESSINGS) ×1
APPLIER CLIP 9.375 MED OPEN (MISCELLANEOUS) ×2
APR CLP MED 9.3 20 MLT OPN (MISCELLANEOUS) ×1
BLADE SURG 15 STRL LF DISP TIS (BLADE) ×1 IMPLANT
BLADE SURG 15 STRL SS (BLADE) ×2
CANISTER SUCTION 2500CC (MISCELLANEOUS) ×2 IMPLANT
CHLORAPREP W/TINT 26ML (MISCELLANEOUS) ×2 IMPLANT
CLIP APPLIE 9.375 MED OPEN (MISCELLANEOUS) IMPLANT
COVER PROBE W GEL 5X96 (DRAPES) ×2 IMPLANT
COVER SURGICAL LIGHT HANDLE (MISCELLANEOUS) ×2 IMPLANT
DERMABOND ADVANCED (GAUZE/BANDAGES/DRESSINGS) ×1
DERMABOND ADVANCED .7 DNX12 (GAUZE/BANDAGES/DRESSINGS) ×1 IMPLANT
DEVICE DUBIN SPECIMEN MAMMOGRA (MISCELLANEOUS) ×2 IMPLANT
DRAPE CHEST BREAST 15X10 FENES (DRAPES) ×2 IMPLANT
DRAPE UTILITY XL STRL (DRAPES) ×2 IMPLANT
ELECT COATED BLADE 2.86 ST (ELECTRODE) ×2 IMPLANT
ELECT REM PT RETURN 9FT ADLT (ELECTROSURGICAL) ×2
ELECTRODE REM PT RTRN 9FT ADLT (ELECTROSURGICAL) ×1 IMPLANT
GLOVE BIO SURGEON STRL SZ7.5 (GLOVE) ×4 IMPLANT
GOWN STRL REUS W/ TWL LRG LVL3 (GOWN DISPOSABLE) ×2 IMPLANT
GOWN STRL REUS W/TWL LRG LVL3 (GOWN DISPOSABLE) ×4
KIT BASIN OR (CUSTOM PROCEDURE TRAY) ×2 IMPLANT
KIT MARKER MARGIN INK (KITS) ×2 IMPLANT
LIGHT WAVEGUIDE WIDE FLAT (MISCELLANEOUS) IMPLANT
NDL HYPO 25X1 1.5 SAFETY (NEEDLE) ×1 IMPLANT
NEEDLE HYPO 25X1 1.5 SAFETY (NEEDLE) ×2 IMPLANT
NS IRRIG 1000ML POUR BTL (IV SOLUTION) ×2 IMPLANT
PACK SURGICAL SETUP 50X90 (CUSTOM PROCEDURE TRAY) ×2 IMPLANT
PENCIL BUTTON HOLSTER BLD 10FT (ELECTRODE) ×2 IMPLANT
SPONGE LAP 18X18 X RAY DECT (DISPOSABLE) ×2 IMPLANT
SUT MNCRL AB 4-0 PS2 18 (SUTURE) ×2 IMPLANT
SUT SILK 2 0 SH (SUTURE) IMPLANT
SUT VIC AB 3-0 SH 18 (SUTURE) ×2 IMPLANT
SYR BULB 3OZ (MISCELLANEOUS) ×2 IMPLANT
SYR CONTROL 10ML LL (SYRINGE) ×2 IMPLANT
TOWEL OR 17X24 6PK STRL BLUE (TOWEL DISPOSABLE) ×2 IMPLANT
TOWEL OR 17X26 10 PK STRL BLUE (TOWEL DISPOSABLE) ×1 IMPLANT
TUBE CONNECTING 12X1/4 (SUCTIONS) ×2 IMPLANT
YANKAUER SUCT BULB TIP NO VENT (SUCTIONS) ×2 IMPLANT

## 2016-07-14 NOTE — Anesthesia Procedure Notes (Addendum)
Procedure Name: LMA Insertion Date/Time: 07/14/2016 10:33 AM Performed by: Marchelle Folks ANN Pre-anesthesia Checklist: Patient identified, Emergency Drugs available, Suction available, Patient being monitored and Timeout performed Patient Re-evaluated:Patient Re-evaluated prior to inductionOxygen Delivery Method: Circle system utilized Preoxygenation: Pre-oxygenation with 100% oxygen Intubation Type: IV induction LMA: LMA inserted LMA Size: 4.0 Number of attempts: 1 Placement Confirmation: breath sounds checked- equal and bilateral and positive ETCO2 Tube secured with: Tape

## 2016-07-14 NOTE — H&P (Signed)
Maria Jackson  Location: [facility] Surgery Patient #: [id number] DOB: 01/06/1946 Undefined / Language: Maria Jackson / Race: White Female   History of Present Illness  The patient is a 71 year old female who presents with breast cancer. We are asked to see the patient in consultation by Dr. Lisbeth Renshaw to evaluate her for a new right breast cancer. The patient is a 71 yo white female who recently went for a 6 month follow up of some calcification in the upper portion of the right breast. It measured about 2cm. It was biopsied and came back as High grade DCIS that was ER +. She does use a premarin vaginal cream. She does not smoke. She does not have any family history of breast cancer   Past Surgical History  Appendectomy  Breast Biopsy  Right. Foot Surgery  Bilateral. Gallbladder Surgery - Open  Hysterectomy (not due to cancer) - Complete  Knee Surgery  Bilateral. Oral Surgery  Tonsillectomy   Diagnostic Studies History  Colonoscopy  within last year Mammogram  within last year Pap Smear  >5 years ago  Medication History Medications Reconciled  Social History Alcohol use  Moderate alcohol use. No caffeine use  No drug use  Tobacco use  Never smoker.  Family History  Arthritis  Family Members In General. Bleeding disorder  Family Members In General. Cerebrovascular Accident  Family Members In General, Mother. Diabetes Mellitus  Family Members In General, Mother. Heart Disease  Family Members In General, Father. Hypertension  Mother. Melanoma  Brother, Father, Mother. Thyroid problems  Brother, Mother.  Pregnancy / Birth History  Age at menarche  5 years. Age of menopause  [age] Contraceptive History  Oral contraceptives. Gravida  2 Irregular periods  Length (months) of breastfeeding  7-12 Maternal age  82-25 Para  2  Other Problems  Arthritis  Back Pain  Breast Cancer  Cholelithiasis  Hemorrhoids  Lump In Breast   Oophorectomy  Bilateral. Vascular Disease     Review of Systems  General Present- Weight Gain. Not Present- Appetite Loss, Chills, Fatigue, Fever, Night Sweats and Weight Loss. Skin Present- Change in Wart/Mole. Not Present- Dryness, Hives, Jaundice, New Lesions, Non-Healing Wounds, Rash and Ulcer. HEENT Present- Ringing in the Ears, Seasonal Allergies and Wears glasses/contact lenses. Not Present- Earache, Hearing Loss, Hoarseness, Nose Bleed, Oral Ulcers, Sinus Pain, Sore Throat, Visual Disturbances and Yellow Eyes. Respiratory Present- Snoring. Not Present- Bloody sputum, Chronic Cough, Difficulty Breathing and Wheezing. Breast Not Present- Breast Mass, Breast Pain, Nipple Discharge and Skin Changes. Cardiovascular Not Present- Chest Pain, Difficulty Breathing Lying Down, Leg Cramps, Palpitations, Rapid Heart Rate, Shortness of Breath and Swelling of Extremities. Gastrointestinal Present- Constipation. Not Present- Abdominal Pain, Bloating, Bloody Stool, Change in Bowel Habits, Chronic diarrhea, Difficulty Swallowing, Excessive gas, Gets full quickly at meals, Hemorrhoids, Indigestion, Nausea, Rectal Pain and Vomiting. Female Genitourinary Not Present- Frequency, Nocturia, Painful Urination, Pelvic Pain and Urgency. Musculoskeletal Present- Back Pain. Not Present- Joint Pain, Joint Stiffness, Muscle Pain, Muscle Weakness and Swelling of Extremities. Neurological Not Present- Decreased Memory, Fainting, Headaches, Numbness, Seizures, Tingling, Tremor, Trouble walking and Weakness. Endocrine Present- Cold Intolerance. Not Present- Excessive Hunger, Hair Changes, Heat Intolerance, Hot flashes and New Diabetes. Hematology Not Present- Blood Thinners, Easy Bruising, Excessive bleeding, Gland problems, HIV and Persistent Infections.   Physical Exam  General Mental Status-Alert. General Appearance-Consistent with stated age. Hydration-Well hydrated. Voice-Normal.  Head and  Neck Head-normocephalic, atraumatic with no lesions or palpable masses. Trachea-midline. Thyroid Gland Characteristics - normal size  and consistency.  Eye Eyeball - Bilateral-Extraocular movements intact. Sclera/Conjunctiva - Bilateral-No scleral icterus.  Chest and Lung Exam Chest and lung exam reveals -quiet, even and easy respiratory effort with no use of accessory muscles and on auscultation, normal breath sounds, no adventitious sounds and normal vocal resonance. Inspection Chest Wall - Normal. Back - normal.  Breast Note: There is no palpable mass in either breast. There is no palpable axillary, supraclavicular, or cervical lymphadenopathy.   Cardiovascular Cardiovascular examination reveals -normal heart sounds, regular rate and rhythm with no murmurs and normal pedal pulses bilaterally.  Abdomen Inspection Inspection of the abdomen reveals - No Hernias. Skin - Scar - no surgical scars. Palpation/Percussion Palpation and Percussion of the abdomen reveal - Soft, Non Tender, No Rebound tenderness, No Rigidity (guarding) and No hepatosplenomegaly. Auscultation Auscultation of the abdomen reveals - Bowel sounds normal.  Neurologic Neurologic evaluation reveals -alert and oriented x 3 with no impairment of recent or remote memory. Mental Status-Normal.  Musculoskeletal Normal Exam - Left-Upper Extremity Strength Normal and Lower Extremity Strength Normal. Normal Exam - Right-Upper Extremity Strength Normal and Lower Extremity Strength Normal.  Lymphatic Head & Neck  General Head & Neck Lymphatics: Bilateral - Description - Normal. Axillary  General Axillary Region: Bilateral - Description - Normal. Tenderness - Non Tender. Femoral & Inguinal  Generalized Femoral & Inguinal Lymphatics: Bilateral - Description - Normal. Tenderness - Non Tender.    Assessment & Plan  DUCTAL CARCINOMA IN SITU (DCIS) OF RIGHT BREAST (D05.11) Impression: The  patient appears to have a small area of dcis in the upper right breast. I have talked to her in detail about the different options for treatment and at this point she favors breast conservation. I would plan for a right breast radioactive seed localized lumpectomy. I have discussed with her in detail the risks and benefits of the surgery as well as some of the technical aspects and she understands and wishes to proceed. She will stop using premarin

## 2016-07-14 NOTE — Anesthesia Preprocedure Evaluation (Signed)
Anesthesia Evaluation  Patient identified by MRN, date of birth, ID band Patient awake    Reviewed: Allergy & Precautions, NPO status , Patient's Chart, lab work & pertinent test results  History of Anesthesia Complications Negative for: history of anesthetic complications  Airway Mallampati: I  TM Distance: >3 FB Neck ROM: Full    Dental  (+) Teeth Intact   Pulmonary neg pulmonary ROS,    breath sounds clear to auscultation       Cardiovascular negative cardio ROS   Rhythm:Regular Rate:Normal     Neuro/Psych    GI/Hepatic Neg liver ROS, PUD,   Endo/Other  negative endocrine ROS  Renal/GU negative Renal ROS     Musculoskeletal  (+) Arthritis ,   Abdominal   Peds  Hematology negative hematology ROS (+)   Anesthesia Other Findings   Reproductive/Obstetrics                             Anesthesia Physical Anesthesia Plan  ASA: II  Anesthesia Plan: General   Post-op Pain Management:    Induction: Intravenous  Airway Management Planned: LMA  Additional Equipment:   Intra-op Plan:   Post-operative Plan: Extubation in OR  Informed Consent: I have reviewed the patients History and Physical, chart, labs and discussed the procedure including the risks, benefits and alternatives for the proposed anesthesia with the patient or authorized representative who has indicated his/her understanding and acceptance.   Dental advisory given  Plan Discussed with: CRNA  Anesthesia Plan Comments:         Anesthesia Quick Evaluation

## 2016-07-14 NOTE — Op Note (Signed)
07/14/2016  11:43 AM  PATIENT:  Maria Jackson  71 y.o. female  PRE-OPERATIVE DIAGNOSIS:  RIGHT BREAST DCIS  POST-OPERATIVE DIAGNOSIS:  RIGHT BREAST DCIS  PROCEDURE:  Procedure(s): RIGHT BREAST LUMPECTOMY WITH RADIOACTIVE SEED LOCALIZATION (Right)  SURGEON:  Surgeon(s) and Role:    * Jovita Kussmaul, MD - Primary  PHYSICIAN ASSISTANT:   ASSISTANTS: none   ANESTHESIA:   local and general  EBL:  No intake/output data recorded.  BLOOD ADMINISTERED:none  DRAINS: none   LOCAL MEDICATIONS USED:  MARCAINE     SPECIMEN:  Source of Specimen:  right breast tissue  DISPOSITION OF SPECIMEN:  PATHOLOGY  COUNTS:  YES  TOURNIQUET:  * No tourniquets in log *  DICTATION: .Dragon Dictation   After informed consent was obtained the patient was brought to the operating room and placed in the supine position on the operating table. After adequate induction of general anesthesia the patient's right breast was prepped with ChloraPrep, allowed to dry, and draped in usual sterile manner. An appropriate timeout was performed. Previously 2 I-125 seeds were placed in the upper portion of the right breast to bracket an area of ductal carcinoma in situ. The neoprobe was set to I-125 in the area of radioactivity was identified in the upper and upper inner portion of the right breast. An elliptical incision was made in the skin overlying these 2 areas of radioactivity. The incision was carried through the skin and subcutaneous tissue sharply with electrocautery. While checking each area of radioactivity frequently with the neoprobe and a circular portion of breast tissue was excised sharply with electrocautery around both radioactive seeds. Once the tissue was removed it was oriented with the appropriate paint colors. The dissection was carried from the skin all the way to the chest wall. A specimen radiograph was obtained that showed the 2 seeds as well as the calcifications to be within the specimen. The  specimen was then sent to pathology for further evaluation. The cavity was marked with clips. Hemostasis was achieved using the Bovie electrocautery. The wound was then irrigated with saline and infiltrated with quarter percent Marcaine. The deep layer of the wound was then closed with layers of interrupted 3-0 Vicryl stitches. The skin was then closed with interrupted 4-0 Monocryl subcuticular stitches. Dermabond dressings were applied. The patient tolerated the procedure well. At the end of the case all needle sponge counts were correct. The patient was then awakened and taken to recovery in stable condition.  PLAN OF CARE: Discharge to home after PACU  PATIENT DISPOSITION:  PACU - hemodynamically stable.   Delay start of Pharmacological VTE agent (>24hrs) due to surgical blood loss or risk of bleeding: not applicable

## 2016-07-14 NOTE — Transfer of Care (Signed)
Immediate Anesthesia Transfer of Care Note  Patient: Maria Jackson  Procedure(s) Performed: Procedure(s): RIGHT BREAST LUMPECTOMY WITH RADIOACTIVE SEED LOCALIZATION (Right)  Patient Location: PACU  Anesthesia Type:General  Level of Consciousness: awake  Airway & Oxygen Therapy: Patient connected to nasal cannula oxygen  Post-op Assessment: Report given to RN  Post vital signs: Reviewed  Last Vitals:  Vitals:   07/14/16 0850 07/14/16 1152  BP: (!) 152/70   Pulse: (!) 59   Resp: 18   Temp: 36.6 C (P) 36.6 C   128/64, 70,16. 100% Last Pain:  Vitals:   07/14/16 0850  TempSrc: Oral  PainSc:       Patients Stated Pain Goal: 3 ([id number])  Complications: No apparent anesthesia complications

## 2016-07-14 NOTE — Interval H&P Note (Signed)
History and Physical Interval Note:  07/14/2016 9:58 AM  Maria Jackson  has presented today for surgery, with the diagnosis of RIGHT BREAST DCIS  The various methods of treatment have been discussed with the patient and family. After consideration of risks, benefits and other options for treatment, the patient has consented to  Procedure(s): RIGHT BREAST LUMPECTOMY WITH RADIOACTIVE SEED LOCALIZATION (Right) as a surgical intervention .  The patient's history has been reviewed, patient examined, no change in status, stable for surgery.  I have reviewed the patient's chart and labs.  Questions were answered to the patient's satisfaction.     TOTH III,PAUL S

## 2016-07-15 ENCOUNTER — Encounter (HOSPITAL_COMMUNITY): Payer: Self-pay | Admitting: General Surgery

## 2016-07-16 NOTE — Anesthesia Postprocedure Evaluation (Addendum)
Anesthesia Post Note  Patient: Maria Jackson  Procedure(s) Performed: Procedure(s) (LRB): RIGHT BREAST LUMPECTOMY WITH RADIOACTIVE SEED LOCALIZATION (Right)  Patient location during evaluation: PACU Anesthesia Type: General Level of consciousness: awake and alert Pain management: pain level controlled Vital Signs Assessment: post-procedure vital signs reviewed and stable Respiratory status: spontaneous breathing, nonlabored ventilation, respiratory function stable and patient connected to nasal cannula oxygen Cardiovascular status: blood pressure returned to baseline and stable Postop Assessment: no signs of nausea or vomiting Anesthetic complications: no       Last Vitals:  Vitals:   07/14/16 1322 07/14/16 1330  BP: 139/66   Pulse: (!) 55 (!) 54  Resp: 15 17  Temp:      Last Pain:  Vitals:   07/14/16 1324  TempSrc:   PainSc: 4                  Chi Garlow,JAMES TERRILL

## 2016-07-30 ENCOUNTER — Ambulatory Visit
Admission: RE | Admit: 2016-07-30 | Discharge: 2016-07-30 | Disposition: A | Discharge status: Home / Self Care | Payer: Medicare Other | Source: Ambulatory Visit | Attending: Radiation Oncology | Admitting: Radiation Oncology

## 2016-07-30 DIAGNOSIS — C50311 Malignant neoplasm of lower-inner quadrant of right female breast: Secondary | ICD-10-CM

## 2016-07-30 DIAGNOSIS — Z170 Estrogen receptor positive status [ER+]: Secondary | ICD-10-CM

## 2016-07-30 DIAGNOSIS — Z510 Encounter for antineoplastic radiation therapy: Secondary | ICD-10-CM | POA: Diagnosis not present

## 2016-07-30 NOTE — Progress Notes (Signed)
  Radiation Oncology         [phone]) [id number] ________________________________  Name: Maria Jackson MRN: [id number]  Date: 07/30/2016  DOB: 02-20-1946  Optical Surface Tracking Plan:  Since intensity modulated radiotherapy (IMRT) and 3D conformal radiation treatment methods are predicated on accurate and precise positioning for treatment, intrafraction motion monitoring is medically necessary to ensure accurate and safe treatment delivery.  The ability to quantify intrafraction motion without excessive ionizing radiation dose can only be performed with optical surface tracking. Accordingly, surface imaging offers the opportunity to obtain 3D measurements of patient position throughout IMRT and 3D treatments without excessive radiation exposure.  I am ordering optical surface tracking for this patient's upcoming course of radiotherapy. ________________________________  Kyung Rudd, MD 07/30/2016 4:03 PM    Reference:   Particia Jasper, et al. Surface imaging-based analysis of intrafraction motion for breast radiotherapy patients.Journal of [place], n. 6, nov. 2014. ISSN [id number].   Available at: <http://www.jacmp.org/index.php/jacmp/article/view/4957>.

## 2016-07-30 NOTE — Progress Notes (Signed)
  Radiation Oncology         [phone]) [phone] ________________________________  Name: Maria Jackson MRN: [id number]  Date: 07/30/2016  DOB: 1945-11-19   DIAGNOSIS:     ICD-9-CM ICD-10-CM   1. Malignant neoplasm of lower-inner quadrant of right breast of female, estrogen receptor positive (HCC) 174.3 C50.311    V86.0 Z17.0     SIMULATION AND TREATMENT PLANNING NOTE  The patient presented for simulation prior to beginning her course of radiation treatment for her diagnosis of right-sided breast cancer. The patient was placed in a supine position on a breast board. A customized vac-lock bag was constructed and this complex treatment device will be used on a daily basis during her treatment. In this fashion, a CT scan was obtained through the chest area and an isocenter was placed near the chest wall within the breast.  The patient will be planned to receive a course of radiation initially to a dose of 42.5 Gy. This will consist of a whole breast radiotherapy technique. To accomplish this, 2 customized blocks have been designed which will correspond to medial and lateral whole breast tangent fields. This treatment will be accomplished at 2.5 Gy per fraction. A forward planning technique will also be evaluated to determine if this approach improves the plan. It is anticipated that the patient will then receive a 7.5 Gy boost to the seroma cavity which has been contoured. This will be accomplished at 2.5 Gy per fraction.   This initial treatment will consist of a 3-D conformal technique. The seroma has been contoured as the primary target structure. Additionally, dose volume histograms of both this target as well as the lungs and heart will also be evaluated. Such an approach is necessary to ensure that the target area is adequately covered while the nearby critical  normal structures are adequately spared.  Plan:  The final anticipated total dose therefore will correspond to 50  Gy.    _______________________________   [name], MD, PhD

## 2016-08-02 DIAGNOSIS — Z510 Encounter for antineoplastic radiation therapy: Secondary | ICD-10-CM | POA: Diagnosis not present

## 2016-08-04 ENCOUNTER — Ambulatory Visit: Payer: Medicare Other | Attending: General Surgery | Admitting: Physical Therapy

## 2016-08-04 DIAGNOSIS — Z6827 Body mass index (BMI) 27.0-27.9, adult: Secondary | ICD-10-CM | POA: Diagnosis not present

## 2016-08-04 DIAGNOSIS — M542 Cervicalgia: Secondary | ICD-10-CM | POA: Diagnosis not present

## 2016-08-04 DIAGNOSIS — M25611 Stiffness of right shoulder, not elsewhere classified: Secondary | ICD-10-CM | POA: Diagnosis not present

## 2016-08-04 DIAGNOSIS — C50919 Malignant neoplasm of unspecified site of unspecified female breast: Secondary | ICD-10-CM | POA: Diagnosis not present

## 2016-08-04 DIAGNOSIS — M810 Age-related osteoporosis without current pathological fracture: Secondary | ICD-10-CM | POA: Diagnosis not present

## 2016-08-04 DIAGNOSIS — R293 Abnormal posture: Secondary | ICD-10-CM | POA: Diagnosis not present

## 2016-08-04 DIAGNOSIS — M5412 Radiculopathy, cervical region: Secondary | ICD-10-CM

## 2016-08-04 NOTE — Patient Instructions (Signed)
Patient was instructed today in a home exercise program today for post op shoulder range of motion. These included active assist shoulder flexion in sitting, scapular retraction, wall walking with shoulder abduction, and hands behind head external rotation.  She was encouraged to do these twice a day, holding 3 seconds and repeating 5 times.    
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
## 2016-08-06 ENCOUNTER — Ambulatory Visit
Admission: RE | Admit: 2016-08-06 | Discharge: 2016-08-06 | Disposition: A | Discharge status: Home / Self Care | Payer: Medicare Other | Source: Ambulatory Visit | Attending: Radiation Oncology | Admitting: Radiation Oncology

## 2016-08-06 DIAGNOSIS — C50311 Malignant neoplasm of lower-inner quadrant of right female breast: Secondary | ICD-10-CM | POA: Diagnosis not present

## 2016-08-06 DIAGNOSIS — Z510 Encounter for antineoplastic radiation therapy: Secondary | ICD-10-CM | POA: Diagnosis not present

## 2016-08-06 DIAGNOSIS — Z880 Allergy status to penicillin: Secondary | ICD-10-CM | POA: Diagnosis not present

## 2016-08-06 DIAGNOSIS — I7300 Raynaud's syndrome without gangrene: Secondary | ICD-10-CM | POA: Diagnosis not present

## 2016-08-06 DIAGNOSIS — M8580 Other specified disorders of bone density and structure, unspecified site: Secondary | ICD-10-CM | POA: Diagnosis not present

## 2016-08-06 DIAGNOSIS — Z882 Allergy status to sulfonamides status: Secondary | ICD-10-CM | POA: Diagnosis not present

## 2016-08-06 DIAGNOSIS — Z96653 Presence of artificial knee joint, bilateral: Secondary | ICD-10-CM | POA: Diagnosis not present

## 2016-08-06 DIAGNOSIS — Z170 Estrogen receptor positive status [ER+]: Secondary | ICD-10-CM | POA: Diagnosis not present

## 2016-08-09 ENCOUNTER — Ambulatory Visit
Admission: RE | Admit: 2016-08-09 | Discharge: 2016-08-09 | Disposition: A | Discharge status: Home / Self Care | Payer: Medicare Other | Source: Ambulatory Visit | Attending: Radiation Oncology | Admitting: Radiation Oncology

## 2016-08-09 DIAGNOSIS — Z510 Encounter for antineoplastic radiation therapy: Secondary | ICD-10-CM | POA: Diagnosis not present

## 2016-08-09 DIAGNOSIS — C50311 Malignant neoplasm of lower-inner quadrant of right female breast: Secondary | ICD-10-CM | POA: Diagnosis not present

## 2016-08-09 DIAGNOSIS — Z96653 Presence of artificial knee joint, bilateral: Secondary | ICD-10-CM | POA: Diagnosis not present

## 2016-08-09 DIAGNOSIS — Z170 Estrogen receptor positive status [ER+]: Secondary | ICD-10-CM | POA: Diagnosis not present

## 2016-08-09 DIAGNOSIS — Z882 Allergy status to sulfonamides status: Secondary | ICD-10-CM | POA: Diagnosis not present

## 2016-08-09 DIAGNOSIS — I7300 Raynaud's syndrome without gangrene: Secondary | ICD-10-CM | POA: Diagnosis not present

## 2016-08-09 DIAGNOSIS — M8580 Other specified disorders of bone density and structure, unspecified site: Secondary | ICD-10-CM | POA: Diagnosis not present

## 2016-08-09 DIAGNOSIS — Z880 Allergy status to penicillin: Secondary | ICD-10-CM | POA: Diagnosis not present

## 2016-08-10 ENCOUNTER — Ambulatory Visit: Payer: Medicare Other | Attending: General Surgery | Admitting: *Deleted

## 2016-08-10 ENCOUNTER — Ambulatory Visit
Admission: RE | Admit: 2016-08-10 | Discharge: 2016-08-10 | Disposition: A | Discharge status: Home / Self Care | Payer: Medicare Other | Source: Ambulatory Visit | Attending: Radiation Oncology | Admitting: Radiation Oncology

## 2016-08-10 DIAGNOSIS — Z510 Encounter for antineoplastic radiation therapy: Secondary | ICD-10-CM | POA: Diagnosis not present

## 2016-08-10 DIAGNOSIS — M5412 Radiculopathy, cervical region: Secondary | ICD-10-CM | POA: Diagnosis not present

## 2016-08-10 DIAGNOSIS — M542 Cervicalgia: Secondary | ICD-10-CM | POA: Diagnosis not present

## 2016-08-10 DIAGNOSIS — M8580 Other specified disorders of bone density and structure, unspecified site: Secondary | ICD-10-CM | POA: Diagnosis not present

## 2016-08-10 DIAGNOSIS — I7300 Raynaud's syndrome without gangrene: Secondary | ICD-10-CM | POA: Diagnosis not present

## 2016-08-10 DIAGNOSIS — M25611 Stiffness of right shoulder, not elsewhere classified: Secondary | ICD-10-CM | POA: Diagnosis not present

## 2016-08-10 DIAGNOSIS — R293 Abnormal posture: Secondary | ICD-10-CM | POA: Diagnosis not present

## 2016-08-10 DIAGNOSIS — Z96653 Presence of artificial knee joint, bilateral: Secondary | ICD-10-CM | POA: Diagnosis not present

## 2016-08-10 DIAGNOSIS — Z170 Estrogen receptor positive status [ER+]: Secondary | ICD-10-CM | POA: Insufficient documentation

## 2016-08-10 DIAGNOSIS — Z880 Allergy status to penicillin: Secondary | ICD-10-CM | POA: Diagnosis not present

## 2016-08-10 DIAGNOSIS — Z882 Allergy status to sulfonamides status: Secondary | ICD-10-CM | POA: Diagnosis not present

## 2016-08-10 DIAGNOSIS — C50311 Malignant neoplasm of lower-inner quadrant of right female breast: Secondary | ICD-10-CM | POA: Insufficient documentation

## 2016-08-10 MED ORDER — ALRA NON-METALLIC DEODORANT (RAD-ONC)
1.0000 "application " | Freq: Once | TOPICAL | Status: AC
  Administered 2016-08-10: 1 via TOPICAL

## 2016-08-10 MED ORDER — RADIAPLEXRX EX GEL
Freq: Once | CUTANEOUS | Status: AC
  Administered 2016-08-10: 15:00:00 via TOPICAL

## 2016-08-10 NOTE — Progress Notes (Signed)
Patient education breast,   Radiation therapy and you book, my business card, radiaplex gel, alra deodorant given to the patient,  Discussed ways to manage side effects fatigue, skin irritation, swelling, tenderness in breast, , sharp pains  Short time ,   radiaplex after rad tx and bedtime , increase protein in diet, drink plenty water, stated  hydrated, , luke arm shower or baths, dove soap unscented preferred, electric razor if shaving under arm,  No under wire bras if possible,  No rubbing, scrubbing or scratching breat area that's being treated,  Teach back 2:59 PM

## 2016-08-10 NOTE — Therapy (Signed)
[facility] [place], [place], [address] Phone: [phone]   Fax:  [phone]  Physical Therapy Treatment  Patient Details  Name: Maria Jackson MRN: [id number] Date of Birth: [birth year] Referring Provider: Dr. Autumn Messing  Encounter Date: 08/10/2016      PT End of Session - 08/10/16 0913    Visit Number 2   Number of Visits 8   Date for PT Re-Evaluation 09/01/16   PT Start Time 0900   PT Stop Time 0950   PT Time Calculation (min) 50 min      Past Medical History:  Diagnosis Date  . Allergy   . Arthritis    knees  . 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 Cancer ([place]) 11/05/11   SQUAMOS CELL CARCINOMA - LEFT CALF  . Cataract   . Chronic lower back pain   . Concussion   . Diplopia    left eye - when she looks down  . Head injury, closed, with concussion   . Insomnia    NO LONGER AN ISSUE  . Malignant neoplasm of lower-inner quadrant of right female breast ([place]) 06/11/2016  . Osteopenia   . Raynaud disease   . Seasonal allergies   . Spondylolisthesis    lumbar    Past Surgical History:  Procedure Laterality Date  . APPENDECTOMY    . BREAST LUMPECTOMY WITH RADIOACTIVE SEED LOCALIZATION Right 07/14/2016   Procedure: RIGHT BREAST LUMPECTOMY WITH RADIOACTIVE SEED LOCALIZATION;  Surgeon: Autumn Messing III, MD;  Location: [facility];  Service: General;  Laterality: Right;  . CHOLECYSTECTOMY    . COLONOSCOPY    . ENDOSCOPIC VEIN LASER TREATMENT     calf riht and left  . FOOT NEUROMA SURGERY     bilateral  . JOINT REPLACEMENT    . TONSILLECTOMY    . TOTAL KNEE ARTHROPLASTY Left 04/27/2013   Procedure: LEFT TOTAL KNEE ARTHROPLASTY;  Surgeon: Gearlean Alf, MD;  [facility];  Service: Orthopedics;  Laterality: Left;  . TOTAL KNEE ARTHROPLASTY Right 08/27/2013   Procedure: RIGHT TOTAL KNEE ARTHROPLASTY;  Surgeon: Gearlean Alf, MD;  [facility];  Service: Orthopedics;  Laterality: Right;  . TUBAL  LIGATION    . VAGINAL HYSTERECTOMY     tah/bso  . WISDOM TOOTH EXTRACTION      There were no vitals filed for this visit.      Subjective Assessment - 08/10/16 0909    Subjective Patient reports she just wants to be sure her arm is ok since having a lumpectomy.  She had a right lumpectomy on 07/14/16 for DCIS and will begin radiation 08/09/16 for 4 weeks. She reports right upper trap pain which radiates to her neck which began last week after laying down for radiation simulation. She also reports some right shoulder tightness.   Pertinent History Right lumpectomy for DCIS 07/14/16; radiation begins 08/09/16.   Patient Stated Goals Get arm back to normal   Currently in Pain? Yes   Pain Score 5    Pain Location Arm   Pain Orientation Right   Pain Descriptors / Indicators Aching   Pain Onset In the past 7 days                         [facility] Adult PT Treatment/Exercise - 08/10/16 0001      Exercises   Exercises Shoulder     Shoulder Exercises: Pulleys   Flexion --  x 5 mins     Modalities   Modalities Electrical Stimulation;Moist Heat     Moist Heat Therapy   Number Minutes Moist Heat 15 Minutes   Moist Heat Location Cervical     Manual Therapy   Manual Therapy Passive ROM;Soft tissue mobilization   Soft tissue mobilization STW/TPR  to RT UT and cervical  paras in supine   Passive ROM PROM to RT shldr all motions                        Long Term Clinic Goals - 08/04/16 1520      CC Long Term Goal  #1   Title Increase cervical AROM to be WNL for improved mobility with daily tasks including driving.   Time 4   Period Weeks   Status New     CC Long Term Goal  #2   Title Report overall neck and arm pain decreased by >/= 50% to tolerate daily tasks with less discomfort.   Time 4   Period Weeks   Status New     CC Long Term Goal  #3   Title Increase right shoulder flexion and abduction to >/= 155 degreesf or increased ease reaching.   Time  4   Period Weeks   Status New     CC Long Term Goal  #4   Title Decrease Quick DASH score to </= 15 for improved functional use of right upper extremity.   Baseline 22.73   Time 4   Period Weeks   Status New            Plan - 08/10/16 0910    Clinical Impression Statement Pt did fairly well with Rx today and was able to perform pulleys for RT UE ROM and did well. She also did well with PROM to RT shldr, but was sore in RT UT and along RT side paraspinals during STW. TPR was performed on RT UT and 2 different places along cervical paras.. Normal response to modalities.   Rehab Potential Excellent   Clinical Impairments Affecting Rehab Potential Recent breast cancer and will be undergoing daily radiation   PT Frequency 2x / week   PT Duration 4 weeks   PT Treatment/Interventions ADLs/Self Care Home Management;Patient/family education;Passive range of motion;Manual techniques;Electrical Stimulation;Therapeutic activities;Therapeutic exercise;Moist Heat;Cryotherapy;Traction   PT Next Visit Plan Soft tissue work right upper rtap and cervical paraspinals, PROM right shoulder, postural exercises, heat/stim (Premod) to bilateral upper traps   PT Home Exercise Plan Issued breast surgery post op shoulder ROM HEP   Consulted and Agree with Plan of Care Patient      Patient will benefit from skilled therapeutic intervention in order to improve the following deficits and impairments:  Impaired UE functional use, Pain, Increased fascial restricitons, Postural dysfunction, Decreased range of motion  Visit Diagnosis: Cervicalgia  Radiculopathy, cervical region  Abnormal posture  Stiffness of right shoulder, not elsewhere classified     Problem List Patient Active Problem List   Diagnosis Date Noted  . Osteoporosis 06/20/2016  . Malignant neoplasm of lower-inner quadrant of right breast of female, estrogen receptor positive ([place]) 06/11/2016  . Cephalalgia 03/04/2016  . Diplopia  09/02/2015  . Neck pain on right side 09/02/2015  . Balance disorder 06/05/2015  . Neck pain 05/15/2015  . Headache 05/15/2015  . Postoperative anemia due to acute blood loss 08/28/2013  . OA (osteoarthritis) of knee 04/27/2013  . 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  Vaginal atrophy 05/25/2012  . CONSTIPATION 08/27/2009  . ULCERATION OF INTESTINE 08/27/2009    RAMSEUR,CHRIS, PTA 08/10/2016, 11:23 AM  [facility] [place], [place], [address] Phone: [phone]   Fax:  [phone]  Name: Maria Jackson MRN: [id number] Date of Birth: [birth year]

## 2016-08-11 ENCOUNTER — Ambulatory Visit
Admission: RE | Admit: 2016-08-11 | Discharge: 2016-08-11 | Disposition: A | Discharge status: Home / Self Care | Payer: Medicare Other | Source: Ambulatory Visit | Attending: Radiation Oncology | Admitting: Radiation Oncology

## 2016-08-11 DIAGNOSIS — Z880 Allergy status to penicillin: Secondary | ICD-10-CM | POA: Diagnosis not present

## 2016-08-11 DIAGNOSIS — Z96653 Presence of artificial knee joint, bilateral: Secondary | ICD-10-CM | POA: Diagnosis not present

## 2016-08-11 DIAGNOSIS — I7300 Raynaud's syndrome without gangrene: Secondary | ICD-10-CM | POA: Diagnosis not present

## 2016-08-11 DIAGNOSIS — M8580 Other specified disorders of bone density and structure, unspecified site: Secondary | ICD-10-CM | POA: Diagnosis not present

## 2016-08-11 DIAGNOSIS — Z170 Estrogen receptor positive status [ER+]: Secondary | ICD-10-CM | POA: Diagnosis not present

## 2016-08-11 DIAGNOSIS — Z510 Encounter for antineoplastic radiation therapy: Secondary | ICD-10-CM | POA: Diagnosis not present

## 2016-08-11 DIAGNOSIS — C50311 Malignant neoplasm of lower-inner quadrant of right female breast: Secondary | ICD-10-CM | POA: Diagnosis not present

## 2016-08-11 DIAGNOSIS — Z882 Allergy status to sulfonamides status: Secondary | ICD-10-CM | POA: Diagnosis not present

## 2016-08-12 ENCOUNTER — Ambulatory Visit: Payer: Medicare Other | Admitting: *Deleted

## 2016-08-12 ENCOUNTER — Ambulatory Visit
Admission: RE | Admit: 2016-08-12 | Discharge: 2016-08-12 | Disposition: A | Discharge status: Home / Self Care | Payer: Medicare Other | Source: Ambulatory Visit | Attending: Radiation Oncology | Admitting: Radiation Oncology

## 2016-08-12 DIAGNOSIS — M542 Cervicalgia: Secondary | ICD-10-CM

## 2016-08-12 DIAGNOSIS — Z170 Estrogen receptor positive status [ER+]: Secondary | ICD-10-CM | POA: Diagnosis not present

## 2016-08-12 DIAGNOSIS — R293 Abnormal posture: Secondary | ICD-10-CM | POA: Diagnosis not present

## 2016-08-12 DIAGNOSIS — M25611 Stiffness of right shoulder, not elsewhere classified: Secondary | ICD-10-CM

## 2016-08-12 DIAGNOSIS — I7300 Raynaud's syndrome without gangrene: Secondary | ICD-10-CM | POA: Diagnosis not present

## 2016-08-12 DIAGNOSIS — Z880 Allergy status to penicillin: Secondary | ICD-10-CM | POA: Diagnosis not present

## 2016-08-12 DIAGNOSIS — Z510 Encounter for antineoplastic radiation therapy: Secondary | ICD-10-CM | POA: Diagnosis not present

## 2016-08-12 DIAGNOSIS — M5412 Radiculopathy, cervical region: Secondary | ICD-10-CM | POA: Diagnosis not present

## 2016-08-12 DIAGNOSIS — C50311 Malignant neoplasm of lower-inner quadrant of right female breast: Secondary | ICD-10-CM | POA: Diagnosis not present

## 2016-08-12 DIAGNOSIS — M8580 Other specified disorders of bone density and structure, unspecified site: Secondary | ICD-10-CM | POA: Diagnosis not present

## 2016-08-12 DIAGNOSIS — Z882 Allergy status to sulfonamides status: Secondary | ICD-10-CM | POA: Diagnosis not present

## 2016-08-12 DIAGNOSIS — Z96653 Presence of artificial knee joint, bilateral: Secondary | ICD-10-CM | POA: Diagnosis not present

## 2016-08-12 NOTE — Therapy (Signed)
[facility] [place], [place], [address] Phone: [phone]   Fax:  [phone]  Physical Therapy Treatment  Patient Details  Name: Maria Jackson MRN: [id number] Date of Birth: 12-Mar-1946 Referring Provider: Dr. Autumn Messing  Encounter Date: 08/12/2016      PT End of Session - 08/12/16 0912    Visit Number 3   Number of Visits 8   Date for PT Re-Evaluation 09/01/16   PT Start Time 0900   PT Stop Time 0950   PT Time Calculation (min) 50 min      Past Medical History:  Diagnosis Date  . Allergy   . Arthritis    knees  . 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 Cancer ([place]) 11/05/11   SQUAMOS CELL CARCINOMA - LEFT CALF  . Cataract   . Chronic lower back pain   . Concussion   . Diplopia    left eye - when she looks down  . Head injury, closed, with concussion   . Insomnia    NO LONGER AN ISSUE  . Malignant neoplasm of lower-inner quadrant of right female breast ([place]) 06/11/2016  . Osteopenia   . Raynaud disease   . Seasonal allergies   . Spondylolisthesis    lumbar    Past Surgical History:  Procedure Laterality Date  . APPENDECTOMY    . BREAST LUMPECTOMY WITH RADIOACTIVE SEED LOCALIZATION Right 07/14/2016   Procedure: RIGHT BREAST LUMPECTOMY WITH RADIOACTIVE SEED LOCALIZATION;  Surgeon: Autumn Messing III, MD;  Location: [place];  Service: General;  Laterality: Right;  . CHOLECYSTECTOMY    . COLONOSCOPY    . ENDOSCOPIC VEIN LASER TREATMENT     calf riht and left  . FOOT NEUROMA SURGERY     bilateral  . JOINT REPLACEMENT    . TONSILLECTOMY    . TOTAL KNEE ARTHROPLASTY Left 04/27/2013   Procedure: LEFT TOTAL KNEE ARTHROPLASTY;  Surgeon: Gearlean Alf, MD;  [facility];  Service: Orthopedics;  Laterality: Left;  . TOTAL KNEE ARTHROPLASTY Right 08/27/2013   Procedure: RIGHT TOTAL KNEE ARTHROPLASTY;  Surgeon: Gearlean Alf, MD;  [facility]: [facility];  Service: Orthopedics;  Laterality: Right;  . TUBAL  LIGATION    . VAGINAL HYSTERECTOMY     tah/bso  . WISDOM TOOTH EXTRACTION      There were no vitals filed for this visit.      Subjective Assessment - 08/12/16 0911    Subjective Pt was sore after last RX   Pertinent History Right lumpectomy for DCIS 07/14/16; radiation begins 08/09/16.   Patient Stated Goals Get arm back to normal   Pain Score 5    Pain Location Shoulder   Pain Descriptors / Indicators Aching   Pain Type Surgical pain   Pain Onset In the past 7 days   Pain Frequency Intermittent                         [facility] Adult PT Treatment/Exercise - 08/12/16 0001      Exercises   Exercises Shoulder     Shoulder Exercises: Pulleys   Flexion --  x 5 mins     Modalities   Modalities Electrical Stimulation;Moist Heat     Moist Heat Therapy   Number Minutes Moist Heat 15 Minutes   Moist Heat Location Cervical     Electrical Stimulation   Electrical Stimulation Location RT shldr/UT Premod x 15 mins, 5 sec on/off  80-150hz    Electrical Stimulation Goals Pain;Other (comment)     Manual Therapy   Manual Therapy Passive ROM;Soft tissue mobilization   Soft tissue mobilization STW/TPR  to RT UT and cervical  paras in supine   Passive ROM PROM to RT shldr all motions                        Long Term Clinic Goals - 08/04/16 1520      CC Long Term Goal  #1   Title Increase cervical AROM to be WNL for improved mobility with daily tasks including driving.   Time 4   Period Weeks   Status New     CC Long Term Goal  #2   Title Report overall neck and arm pain decreased by >/= 50% to tolerate daily tasks with less discomfort.   Time 4   Period Weeks   Status New     CC Long Term Goal  #3   Title Increase right shoulder flexion and abduction to >/= 155 degreesf or increased ease reaching.   Time 4   Period Weeks   Status New     CC Long Term Goal  #4   Title Decrease Quick DASH score to </= 15 for improved functional use of right  upper extremity.   Baseline 22.73   Time 4   Period Weeks   Status New            Plan - 08/12/16 [id number]    Clinical Impression Statement Pt did fairly well with Rx today, but was a little sore after last Rx along RT side cervical paras and aching into arm. Her ROM for RT shldr was good and was able to reach flexion to 150 degrees and had less pain with cervical rotation. LTGs are ongoing at this time due to deficits.   Rehab Potential Excellent   Clinical Impairments Affecting Rehab Potential Recent breast cancer and will be undergoing daily radiation   PT Frequency 2x / week   PT Duration 4 weeks   PT Treatment/Interventions ADLs/Self Care Home Management;Patient/family education;Passive range of motion;Manual techniques;Electrical Stimulation;Therapeutic activities;Therapeutic exercise;Moist Heat;Cryotherapy;Traction   PT Next Visit Plan Soft tissue work right upper rtap and cervical paraspinals, PROM right shoulder, postural exercises, heat/stim (Premod) to bilateral upper traps   PT Home Exercise Plan Issued breast surgery post op shoulder ROM HEP   Consulted and Agree with Plan of Care Patient      Patient will benefit from skilled therapeutic intervention in order to improve the following deficits and impairments:  Impaired UE functional use, Pain, Increased fascial restricitons, Postural dysfunction, Decreased range of motion  Visit Diagnosis: Cervicalgia  Radiculopathy, cervical region  Abnormal posture  Stiffness of right shoulder, not elsewhere classified     Problem List Patient Active Problem List   Diagnosis Date Noted  . Osteoporosis 06/20/2016  . Malignant neoplasm of lower-inner quadrant of right breast of female, estrogen receptor positive ([place]) 06/11/2016  . Cephalalgia 03/04/2016  . Diplopia 09/02/2015  . Neck pain on right side 09/02/2015  . Balance disorder 06/05/2015  . Neck pain 05/15/2015  . Headache 05/15/2015  . Postoperative anemia due to  acute blood loss 08/28/2013  . OA (osteoarthritis) of knee 04/27/2013  . 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 Vaginal atrophy 05/25/2012  . CONSTIPATION 08/27/2009  . ULCERATION OF INTESTINE 08/27/2009    RAMSEUR,CHRIS, PTA 08/12/2016, 10:39 AM  [facility] [address]  [place], [place], [address] Phone: [phone]   Fax:  [phone]  Name: JENNE HAIST MRN: [id number] Date of Birth: [birth year]

## 2016-08-13 ENCOUNTER — Encounter: Payer: Self-pay | Admitting: Radiation Oncology

## 2016-08-13 ENCOUNTER — Ambulatory Visit
Admission: RE | Admit: 2016-08-13 | Discharge: 2016-08-13 | Disposition: A | Discharge status: Home / Self Care | Payer: Medicare Other | Source: Ambulatory Visit | Attending: Radiation Oncology | Admitting: Radiation Oncology

## 2016-08-13 DIAGNOSIS — M8580 Other specified disorders of bone density and structure, unspecified site: Secondary | ICD-10-CM | POA: Diagnosis not present

## 2016-08-13 DIAGNOSIS — Z96653 Presence of artificial knee joint, bilateral: Secondary | ICD-10-CM | POA: Diagnosis not present

## 2016-08-13 DIAGNOSIS — C50311 Malignant neoplasm of lower-inner quadrant of right female breast: Secondary | ICD-10-CM | POA: Diagnosis not present

## 2016-08-13 DIAGNOSIS — Z880 Allergy status to penicillin: Secondary | ICD-10-CM | POA: Diagnosis not present

## 2016-08-13 DIAGNOSIS — I7300 Raynaud's syndrome without gangrene: Secondary | ICD-10-CM | POA: Diagnosis not present

## 2016-08-13 DIAGNOSIS — Z510 Encounter for antineoplastic radiation therapy: Secondary | ICD-10-CM | POA: Diagnosis not present

## 2016-08-13 DIAGNOSIS — Z170 Estrogen receptor positive status [ER+]: Secondary | ICD-10-CM | POA: Diagnosis not present

## 2016-08-13 DIAGNOSIS — Z882 Allergy status to sulfonamides status: Secondary | ICD-10-CM | POA: Diagnosis not present

## 2016-08-13 DIAGNOSIS — M810 Age-related osteoporosis without current pathological fracture: Secondary | ICD-10-CM | POA: Diagnosis not present

## 2016-08-13 NOTE — Progress Notes (Signed)
Weekly rad txs right breast 5/20 completed, no skin changes, uses radiaplex bid, some mild discomfort at times,  Appetite good, BP (!) 137/57 (BP Location: Left Arm, Patient Position: Sitting, Cuff Size: Normal)   Temp 98.2 F (36.8 C) (Oral)   Resp 20   Wt 152 lb 9.6 oz (69.2 kg)   BMI 27.03 kg/m   Wt Readings from Last 3 Encounters:  08/13/16 152 lb 9.6 oz (69.2 kg)  07/14/16 151 lb (68.5 kg)  07/08/16 151 lb (68.5 kg)

## 2016-08-16 ENCOUNTER — Ambulatory Visit
Admission: RE | Admit: 2016-08-16 | Discharge: 2016-08-16 | Disposition: A | Discharge status: Home / Self Care | Payer: Medicare Other | Source: Ambulatory Visit | Attending: Radiation Oncology | Admitting: Radiation Oncology

## 2016-08-16 DIAGNOSIS — Z96653 Presence of artificial knee joint, bilateral: Secondary | ICD-10-CM | POA: Diagnosis not present

## 2016-08-16 DIAGNOSIS — M8580 Other specified disorders of bone density and structure, unspecified site: Secondary | ICD-10-CM | POA: Diagnosis not present

## 2016-08-16 DIAGNOSIS — Z882 Allergy status to sulfonamides status: Secondary | ICD-10-CM | POA: Diagnosis not present

## 2016-08-16 DIAGNOSIS — Z170 Estrogen receptor positive status [ER+]: Secondary | ICD-10-CM | POA: Diagnosis not present

## 2016-08-16 DIAGNOSIS — Z510 Encounter for antineoplastic radiation therapy: Secondary | ICD-10-CM | POA: Diagnosis not present

## 2016-08-16 DIAGNOSIS — I7300 Raynaud's syndrome without gangrene: Secondary | ICD-10-CM | POA: Diagnosis not present

## 2016-08-16 DIAGNOSIS — C50311 Malignant neoplasm of lower-inner quadrant of right female breast: Secondary | ICD-10-CM | POA: Diagnosis not present

## 2016-08-16 DIAGNOSIS — Z880 Allergy status to penicillin: Secondary | ICD-10-CM | POA: Diagnosis not present

## 2016-08-17 ENCOUNTER — Ambulatory Visit
Admission: RE | Admit: 2016-08-17 | Discharge: 2016-08-17 | Disposition: A | Discharge status: Home / Self Care | Payer: Medicare Other | Source: Ambulatory Visit | Attending: Radiation Oncology | Admitting: Radiation Oncology

## 2016-08-17 ENCOUNTER — Ambulatory Visit: Payer: Medicare Other | Admitting: *Deleted

## 2016-08-17 DIAGNOSIS — Z510 Encounter for antineoplastic radiation therapy: Secondary | ICD-10-CM | POA: Diagnosis not present

## 2016-08-17 DIAGNOSIS — C50311 Malignant neoplasm of lower-inner quadrant of right female breast: Secondary | ICD-10-CM | POA: Diagnosis not present

## 2016-08-17 DIAGNOSIS — Z170 Estrogen receptor positive status [ER+]: Secondary | ICD-10-CM | POA: Diagnosis not present

## 2016-08-17 DIAGNOSIS — M8580 Other specified disorders of bone density and structure, unspecified site: Secondary | ICD-10-CM | POA: Diagnosis not present

## 2016-08-17 DIAGNOSIS — Z882 Allergy status to sulfonamides status: Secondary | ICD-10-CM | POA: Diagnosis not present

## 2016-08-17 DIAGNOSIS — I7300 Raynaud's syndrome without gangrene: Secondary | ICD-10-CM | POA: Diagnosis not present

## 2016-08-17 DIAGNOSIS — Z96653 Presence of artificial knee joint, bilateral: Secondary | ICD-10-CM | POA: Diagnosis not present

## 2016-08-17 DIAGNOSIS — Z880 Allergy status to penicillin: Secondary | ICD-10-CM | POA: Diagnosis not present

## 2016-08-18 ENCOUNTER — Encounter: Payer: Self-pay | Admitting: Radiation Oncology

## 2016-08-18 ENCOUNTER — Ambulatory Visit
Admission: RE | Admit: 2016-08-18 | Discharge: 2016-08-18 | Disposition: A | Discharge status: Home / Self Care | Payer: Medicare Other | Source: Ambulatory Visit | Attending: Radiation Oncology | Admitting: Radiation Oncology

## 2016-08-18 DIAGNOSIS — Z882 Allergy status to sulfonamides status: Secondary | ICD-10-CM | POA: Diagnosis not present

## 2016-08-18 DIAGNOSIS — I7300 Raynaud's syndrome without gangrene: Secondary | ICD-10-CM | POA: Diagnosis not present

## 2016-08-18 DIAGNOSIS — C50311 Malignant neoplasm of lower-inner quadrant of right female breast: Secondary | ICD-10-CM | POA: Diagnosis not present

## 2016-08-18 DIAGNOSIS — Z880 Allergy status to penicillin: Secondary | ICD-10-CM | POA: Diagnosis not present

## 2016-08-18 DIAGNOSIS — Z170 Estrogen receptor positive status [ER+]: Secondary | ICD-10-CM | POA: Diagnosis not present

## 2016-08-18 DIAGNOSIS — Z96653 Presence of artificial knee joint, bilateral: Secondary | ICD-10-CM | POA: Diagnosis not present

## 2016-08-18 DIAGNOSIS — M8580 Other specified disorders of bone density and structure, unspecified site: Secondary | ICD-10-CM | POA: Diagnosis not present

## 2016-08-18 DIAGNOSIS — Z510 Encounter for antineoplastic radiation therapy: Secondary | ICD-10-CM | POA: Diagnosis not present

## 2016-08-19 ENCOUNTER — Encounter: Payer: Self-pay | Admitting: Gastroenterology

## 2016-08-19 ENCOUNTER — Ambulatory Visit: Payer: Medicare Other | Admitting: *Deleted

## 2016-08-19 ENCOUNTER — Ambulatory Visit
Admission: RE | Admit: 2016-08-19 | Discharge: 2016-08-19 | Disposition: A | Discharge status: Home / Self Care | Payer: Medicare Other | Source: Ambulatory Visit | Attending: Radiation Oncology | Admitting: Radiation Oncology

## 2016-08-19 DIAGNOSIS — M542 Cervicalgia: Secondary | ICD-10-CM

## 2016-08-19 DIAGNOSIS — M5412 Radiculopathy, cervical region: Secondary | ICD-10-CM | POA: Diagnosis not present

## 2016-08-19 DIAGNOSIS — M8580 Other specified disorders of bone density and structure, unspecified site: Secondary | ICD-10-CM | POA: Diagnosis not present

## 2016-08-19 DIAGNOSIS — M25611 Stiffness of right shoulder, not elsewhere classified: Secondary | ICD-10-CM | POA: Diagnosis not present

## 2016-08-19 DIAGNOSIS — Z880 Allergy status to penicillin: Secondary | ICD-10-CM | POA: Diagnosis not present

## 2016-08-19 DIAGNOSIS — I7300 Raynaud's syndrome without gangrene: Secondary | ICD-10-CM | POA: Diagnosis not present

## 2016-08-19 DIAGNOSIS — Z510 Encounter for antineoplastic radiation therapy: Secondary | ICD-10-CM | POA: Diagnosis not present

## 2016-08-19 DIAGNOSIS — Z882 Allergy status to sulfonamides status: Secondary | ICD-10-CM | POA: Diagnosis not present

## 2016-08-19 DIAGNOSIS — C50311 Malignant neoplasm of lower-inner quadrant of right female breast: Secondary | ICD-10-CM | POA: Diagnosis not present

## 2016-08-19 DIAGNOSIS — Z96653 Presence of artificial knee joint, bilateral: Secondary | ICD-10-CM | POA: Diagnosis not present

## 2016-08-19 DIAGNOSIS — R293 Abnormal posture: Secondary | ICD-10-CM | POA: Diagnosis not present

## 2016-08-19 DIAGNOSIS — Z170 Estrogen receptor positive status [ER+]: Secondary | ICD-10-CM | POA: Diagnosis not present

## 2016-08-19 NOTE — Therapy (Signed)
[facility] [place], [place], [address] Phone: [phone]   Fax:  [phone]  Physical Therapy Treatment  Patient Details  Name: Maria Jackson MRN: [id number] Date of Birth: 09-Jan-1946 Referring Provider: Dr. Autumn Messing  Encounter Date: 08/19/2016      PT End of Session - 08/19/16 0909    Visit Number 4   Number of Visits 8   Date for PT Re-Evaluation 09/01/16   PT Start Time 0900   PT Stop Time 0950   PT Time Calculation (min) 50 min      Past Medical History:  Diagnosis Date  . Allergy   . Arthritis    knees  . 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 Cancer ([place]) 11/05/11   SQUAMOS CELL CARCINOMA - LEFT CALF  . Cataract   . Chronic lower back pain   . Concussion   . Diplopia    left eye - when she looks down  . Head injury, closed, with concussion   . Insomnia    NO LONGER AN ISSUE  . Malignant neoplasm of lower-inner quadrant of right female breast ([place]) 06/11/2016  . Osteopenia   . Raynaud disease   . Seasonal allergies   . Spondylolisthesis    lumbar    Past Surgical History:  Procedure Laterality Date  . APPENDECTOMY    . BREAST LUMPECTOMY WITH RADIOACTIVE SEED LOCALIZATION Right 07/14/2016   Procedure: RIGHT BREAST LUMPECTOMY WITH RADIOACTIVE SEED LOCALIZATION;  Surgeon: Autumn Messing III, MD;  Location: [facility];  Service: General;  Laterality: Right;  . CHOLECYSTECTOMY    . COLONOSCOPY    . ENDOSCOPIC VEIN LASER TREATMENT     calf riht and left  . FOOT NEUROMA SURGERY     bilateral  . JOINT REPLACEMENT    . TONSILLECTOMY    . TOTAL KNEE ARTHROPLASTY Left 04/27/2013   Procedure: LEFT TOTAL KNEE ARTHROPLASTY;  Surgeon: Gearlean Alf, MD;  [facility];  Service: Orthopedics;  Laterality: Left;  . TOTAL KNEE ARTHROPLASTY Right 08/27/2013   Procedure: RIGHT TOTAL KNEE ARTHROPLASTY;  Surgeon: Gearlean Alf, MD;  [facility];  Service: Orthopedics;  Laterality: Right;  . TUBAL  LIGATION    . VAGINAL HYSTERECTOMY     tah/bso  . WISDOM TOOTH EXTRACTION      There were no vitals filed for this visit.      Subjective Assessment - 08/19/16 0907    Subjective Did great after last Rx. Pain today is 3/10   Pertinent History Right lumpectomy for DCIS 07/14/16; radiation begins 08/09/16.   Patient Stated Goals Get arm back to normal   Currently in Pain? Yes   Pain Score 2    Pain Location Shoulder   Pain Orientation Right   Pain Descriptors / Indicators Aching   Pain Type Surgical pain   Pain Onset In the past 7 days   Pain Frequency Intermittent                         [facility] Adult PT Treatment/Exercise - 08/19/16 0001      Modalities   Modalities Electrical Stimulation;Moist Heat     Moist Heat Therapy   Number Minutes Moist Heat 15 Minutes   Moist Heat Location Cervical     Electrical Stimulation   Electrical Stimulation Location RT shldr/UT Premod x 15 mins, 5 sec on/off 80-150hz    Electrical Stimulation Goals Pain;Other (comment)  Manual Therapy   Manual Therapy Passive ROM;Soft tissue mobilization   Passive ROM cervical PROM and light manual traction, STW to LT and RT paras supinf     Pulleys for 5 mins                   Long Term Clinic Goals - 08/04/16 1520      CC Long Term Goal  #1   Title Increase cervical AROM to be WNL for improved mobility with daily tasks including driving.   Time 4   Period Weeks   Status New     CC Long Term Goal  #2   Title Report overall neck and arm pain decreased by >/= 50% to tolerate daily tasks with less discomfort.   Time 4   Period Weeks   Status New     CC Long Term Goal  #3   Title Increase right shoulder flexion and abduction to >/= 155 degreesf or increased ease reaching.   Time 4   Period Weeks   Status New     CC Long Term Goal  #4   Title Decrease Quick DASH score to </= 15 for improved functional use of right upper extremity.   Baseline 22.73   Time 4    Period Weeks   Status New            Plan - 08/19/16 [id number]    Clinical Impression Statement Pt was doing better today with less pain soreness. She was able to reach 55 degrees of RT cervical rotaion with mainly soreness and tightness . normal response to modalities.   Rehab Potential Excellent   Clinical Impairments Affecting Rehab Potential Recent breast cancer and will be undergoing daily radiation   PT Frequency 2x / week   PT Duration 4 weeks   PT Treatment/Interventions ADLs/Self Care Home Management;Patient/family education;Passive range of motion;Manual techniques;Electrical Stimulation;Therapeutic activities;Therapeutic exercise;Moist Heat;Cryotherapy;Traction   PT Next Visit Plan Soft tissue work right upper rtap and cervical paraspinals, PROM right shoulder, postural exercises, heat/stim (Premod) to bilateral upper traps   PT Home Exercise Plan Issued breast surgery post op shoulder ROM HEP      Patient will benefit from skilled therapeutic intervention in order to improve the following deficits and impairments:  Impaired UE functional use, Pain, Increased fascial restricitons, Postural dysfunction, Decreased range of motion  Visit Diagnosis: Cervicalgia  Radiculopathy, cervical region  Abnormal posture     Problem List Patient Active Problem List   Diagnosis Date Noted  . Osteoporosis 06/20/2016  . Malignant neoplasm of lower-inner quadrant of right breast of female, estrogen receptor positive ([place]) 06/11/2016  . Cephalalgia 03/04/2016  . Diplopia 09/02/2015  . Neck pain on right side 09/02/2015  . Balance disorder 06/05/2015  . Neck pain 05/15/2015  . Headache 05/15/2015  . Postoperative anemia due to acute blood loss 08/28/2013  . OA (osteoarthritis) of knee 04/27/2013  . 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 Vaginal atrophy 05/25/2012  . CONSTIPATION 08/27/2009  . ULCERATION OF INTESTINE 08/27/2009    Kelvis Berger,CHRIS, PTA 08/19/2016, 9:50  AM  [facility] [place], [place], [address] Phone: [phone]   Fax:  [phone]  Name: Maria Jackson MRN: [id number] Date of Birth: 09/05/1945

## 2016-08-20 ENCOUNTER — Ambulatory Visit
Admission: RE | Admit: 2016-08-20 | Discharge: 2016-08-20 | Disposition: A | Discharge status: Home / Self Care | Payer: Medicare Other | Source: Ambulatory Visit | Attending: Radiation Oncology | Admitting: Radiation Oncology

## 2016-08-20 ENCOUNTER — Encounter: Payer: Self-pay | Admitting: Radiation Oncology

## 2016-08-20 VITALS — BP 132/55 | HR 64 | Temp 98.0°F | Resp 16 | Wt 151.6 lb

## 2016-08-20 DIAGNOSIS — Z96653 Presence of artificial knee joint, bilateral: Secondary | ICD-10-CM | POA: Diagnosis not present

## 2016-08-20 DIAGNOSIS — Z510 Encounter for antineoplastic radiation therapy: Secondary | ICD-10-CM | POA: Diagnosis not present

## 2016-08-20 DIAGNOSIS — Z880 Allergy status to penicillin: Secondary | ICD-10-CM | POA: Diagnosis not present

## 2016-08-20 DIAGNOSIS — Z882 Allergy status to sulfonamides status: Secondary | ICD-10-CM | POA: Diagnosis not present

## 2016-08-20 DIAGNOSIS — C50311 Malignant neoplasm of lower-inner quadrant of right female breast: Secondary | ICD-10-CM | POA: Diagnosis not present

## 2016-08-20 DIAGNOSIS — Z170 Estrogen receptor positive status [ER+]: Secondary | ICD-10-CM | POA: Diagnosis not present

## 2016-08-20 DIAGNOSIS — I7300 Raynaud's syndrome without gangrene: Secondary | ICD-10-CM | POA: Diagnosis not present

## 2016-08-20 DIAGNOSIS — M8580 Other specified disorders of bone density and structure, unspecified site: Secondary | ICD-10-CM | POA: Diagnosis not present

## 2016-08-20 NOTE — Progress Notes (Signed)
Weekly rad txs  Right breast, 10/20 completed,  Mild erythema,skin intact, ashiness in breast at times, ans slight itchiness , appetite good,  Mild fatigue 2:55 PM BP (!) 132/55 (BP Location: Left Arm, Patient Position: Sitting, Cuff Size: Normal)   Pulse 64   Temp 98 F (36.7 C) (Oral)   Resp 16   Wt 151 lb 9.6 oz (68.8 kg)   BMI 26.85 kg/m   Wt Readings from Last 3 Encounters:  08/20/16 151 lb 9.6 oz (68.8 kg)  08/13/16 152 lb 9.6 oz (69.2 kg)  07/14/16 151 lb (68.5 kg)

## 2016-08-21 NOTE — Progress Notes (Signed)
Department of Radiation Oncology  Phone:  [phone] Fax:        [phone]  Weekly Treatment Note    Name: Maria Jackson Date: 08/21/2016 MRN: [id number] DOB: [birth year]   Diagnosis:     ICD-9-CM ICD-10-CM   1. Malignant neoplasm of lower-inner quadrant of right breast of female, estrogen receptor positive ([place]) 174.3 C50.311    V86.0 Z17.0      Current dose: 25 Gy  Current fraction: 10   MEDICATIONS: Current Outpatient Prescriptions  Medication Sig Dispense Refill  . aspirin 81 MG tablet Take 81 mg by mouth daily.    Marland Kitchen azithromycin (ZITHROMAX) 500 MG tablet Take 500 mg by mouth as directed. 500 mg by mouth 1 hour prior to dental work.    . cholecalciferol (VITAMIN D) 1000 units tablet Take 2,000 Units by mouth daily.    . cycloSPORINE (RESTASIS) 0.05 % ophthalmic emulsion Place 1 drop into both eyes 2 (two) times daily.    . diphenhydrAMINE (BENADRYL) 25 mg capsule Take 25 mg by mouth every 6 (six) hours as needed (for congestion/allergies.).    Marland Kitchen hyaluronate sodium (RADIAPLEXRX) GEL Apply 1 application topically 2 (two) times daily.    Marland Kitchen loratadine (CLARITIN) 10 MG tablet Take 10 mg by mouth as needed for allergies.    . Lutein 6 MG CAPS Take 6 mg by mouth 2 (two) times daily.     . Magnesium 250 MG TABS Take 250 mg by mouth 2 (two) times daily.     . Multiple Vitamin (MULTIVITAMIN WITH MINERALS) TABS tablet Take 1 tablet by mouth daily. Centrum Silver for Women    . nitrofurantoin, macrocrystal-monohydrate, (MACROBID) 100 MG capsule Take 100 mg by mouth daily as needed (for UTI prevention).    . non-metallic deodorant Jethro Poling) MISC Apply 1 application topically daily as needed.    . Omega-3 Fatty Acids (FISH OIL) 1200 MG CAPS Take 1,200 mg by mouth daily.    . Probiotic Product (ALIGN) 4 MG CAPS Take 4 mg by mouth at bedtime.    . sodium chloride (OCEAN) 0.65 % nasal spray Place 1 spray into the nose 2 (two) times daily as needed for congestion.     .  triamcinolone (NASACORT AQ) 55 MCG/ACT AERO nasal inhaler Place 2 sprays into the nose daily as needed (for allergies.).    Marland Kitchen Vaginal Lubricant (REPLENS VA) Place 1 application vaginally every 3 (three) days.     No current facility-administered medications for this encounter.      ALLERGIES: Bee venom; Ibuprofen; Cephalexin; Penicillins; and Sulfonamide derivatives   LABORATORY DATA:  Lab Results  Component Value Date   WBC 5.5 07/14/2016   HGB 13.7 07/14/2016   HCT 40.1 07/14/2016   MCV 100.5 (H) 07/14/2016   PLT 338 07/14/2016   Lab Results  Component Value Date   NA 138 07/08/2016   K 4.2 07/08/2016   CL 101 07/08/2016   CO2 30 07/08/2016   Lab Results  Component Value Date   ALT 18 06/16/2016   AST 22 06/16/2016   ALKPHOS 74 06/16/2016   BILITOT 0.66 06/16/2016     NARRATIVE: Maria Jackson was seen today for weekly treatment management. The chart was checked and the patient's films were reviewed.  The patient is doing very well after having completed her second week of treatment. Some mild fatigue but otherwise doing very well.  Weekly rad txs  Right breast, 10/20 completed,  Mild erythema,skin intact, ashiness in breast at times,  ans slight itchiness , appetite good,  Mild fatigue 10:50 AM BP (!) 132/55 (BP Location: Left Arm, Patient Position: Sitting, Cuff Size: Normal)   Pulse 64   Temp 98 F (36.7 C) (Oral)   Resp 16   Wt 151 lb 9.6 oz (68.8 kg)   BMI 26.85 kg/m   Wt Readings from Last 3 Encounters:  08/20/16 151 lb 9.6 oz (68.8 kg)  08/13/16 152 lb 9.6 oz (69.2 kg)  07/14/16 151 lb (68.5 kg)    PHYSICAL EXAMINATION: weight is 151 lb 9.6 oz (68.8 kg). Her oral temperature is 98 F (36.7 C). Her blood pressure is 132/55 (abnormal) and her pulse is 64. Her respiration is 16.        ASSESSMENT: The patient is doing satisfactorily with treatment.  PLAN: We will continue with the patient's radiation treatment as planned.

## 2016-08-23 ENCOUNTER — Ambulatory Visit
Admission: RE | Admit: 2016-08-23 | Discharge: 2016-08-23 | Disposition: A | Discharge status: Home / Self Care | Payer: Medicare Other | Source: Ambulatory Visit | Attending: Radiation Oncology | Admitting: Radiation Oncology

## 2016-08-23 DIAGNOSIS — Z882 Allergy status to sulfonamides status: Secondary | ICD-10-CM | POA: Diagnosis not present

## 2016-08-23 DIAGNOSIS — M8580 Other specified disorders of bone density and structure, unspecified site: Secondary | ICD-10-CM | POA: Diagnosis not present

## 2016-08-23 DIAGNOSIS — Z880 Allergy status to penicillin: Secondary | ICD-10-CM | POA: Diagnosis not present

## 2016-08-23 DIAGNOSIS — C50311 Malignant neoplasm of lower-inner quadrant of right female breast: Secondary | ICD-10-CM | POA: Diagnosis not present

## 2016-08-23 DIAGNOSIS — Z510 Encounter for antineoplastic radiation therapy: Secondary | ICD-10-CM | POA: Diagnosis not present

## 2016-08-23 DIAGNOSIS — Z170 Estrogen receptor positive status [ER+]: Secondary | ICD-10-CM | POA: Diagnosis not present

## 2016-08-23 DIAGNOSIS — I7300 Raynaud's syndrome without gangrene: Secondary | ICD-10-CM | POA: Diagnosis not present

## 2016-08-23 DIAGNOSIS — Z96653 Presence of artificial knee joint, bilateral: Secondary | ICD-10-CM | POA: Diagnosis not present

## 2016-08-24 ENCOUNTER — Ambulatory Visit: Payer: Medicare Other | Admitting: *Deleted

## 2016-08-24 ENCOUNTER — Ambulatory Visit
Admission: RE | Admit: 2016-08-24 | Discharge: 2016-08-24 | Disposition: A | Discharge status: Home / Self Care | Payer: Medicare Other | Source: Ambulatory Visit | Attending: Radiation Oncology | Admitting: Radiation Oncology

## 2016-08-24 DIAGNOSIS — M8580 Other specified disorders of bone density and structure, unspecified site: Secondary | ICD-10-CM | POA: Diagnosis not present

## 2016-08-24 DIAGNOSIS — Z882 Allergy status to sulfonamides status: Secondary | ICD-10-CM | POA: Diagnosis not present

## 2016-08-24 DIAGNOSIS — M5412 Radiculopathy, cervical region: Secondary | ICD-10-CM | POA: Diagnosis not present

## 2016-08-24 DIAGNOSIS — M25611 Stiffness of right shoulder, not elsewhere classified: Secondary | ICD-10-CM | POA: Diagnosis not present

## 2016-08-24 DIAGNOSIS — Z880 Allergy status to penicillin: Secondary | ICD-10-CM | POA: Diagnosis not present

## 2016-08-24 DIAGNOSIS — M542 Cervicalgia: Secondary | ICD-10-CM

## 2016-08-24 DIAGNOSIS — C50311 Malignant neoplasm of lower-inner quadrant of right female breast: Secondary | ICD-10-CM | POA: Diagnosis not present

## 2016-08-24 DIAGNOSIS — I7300 Raynaud's syndrome without gangrene: Secondary | ICD-10-CM | POA: Diagnosis not present

## 2016-08-24 DIAGNOSIS — Z170 Estrogen receptor positive status [ER+]: Secondary | ICD-10-CM | POA: Diagnosis not present

## 2016-08-24 DIAGNOSIS — Z96653 Presence of artificial knee joint, bilateral: Secondary | ICD-10-CM | POA: Diagnosis not present

## 2016-08-24 DIAGNOSIS — R293 Abnormal posture: Secondary | ICD-10-CM | POA: Diagnosis not present

## 2016-08-24 DIAGNOSIS — Z510 Encounter for antineoplastic radiation therapy: Secondary | ICD-10-CM | POA: Diagnosis not present

## 2016-08-24 NOTE — Therapy (Signed)
[facility] [place], [place], [address] Phone: [phone]   Fax:  [phone]  Physical Therapy Treatment  Patient Details  Name: Maria Jackson MRN: [id number] Date of Birth: 1945-09-17 Referring Provider: Dr. Autumn Messing  Encounter Date: 08/24/2016      PT End of Session - 08/24/16 1033    Visit Number 5   Number of Visits 8   Date for PT Re-Evaluation 09/01/16   PT Start Time 0900   PT Stop Time 0950   PT Time Calculation (min) 50 min      Past Medical History:  Diagnosis Date  . Allergy   . Arthritis    knees  . 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 Cancer ([place]) 11/05/11   SQUAMOS CELL CARCINOMA - LEFT CALF  . Cataract   . Chronic lower back pain   . Concussion   . Diplopia    left eye - when she looks down  . Head injury, closed, with concussion   . Insomnia    NO LONGER AN ISSUE  . Malignant neoplasm of lower-inner quadrant of right female breast ([place]) 06/11/2016  . Osteopenia   . Raynaud disease   . Seasonal allergies   . Spondylolisthesis    lumbar    Past Surgical History:  Procedure Laterality Date  . APPENDECTOMY    . BREAST LUMPECTOMY WITH RADIOACTIVE SEED LOCALIZATION Right 07/14/2016   Procedure: RIGHT BREAST LUMPECTOMY WITH RADIOACTIVE SEED LOCALIZATION;  Surgeon: Autumn Messing III, MD;  Location: [facility];  Service: General;  Laterality: Right;  . CHOLECYSTECTOMY    . COLONOSCOPY    . ENDOSCOPIC VEIN LASER TREATMENT     calf riht and left  . FOOT NEUROMA SURGERY     bilateral  . JOINT REPLACEMENT    . TONSILLECTOMY    . TOTAL KNEE ARTHROPLASTY Left 04/27/2013   Procedure: LEFT TOTAL KNEE ARTHROPLASTY;  Surgeon: Gearlean Alf, MD;  [facility];  Service: Orthopedics;  Laterality: Left;  . TOTAL KNEE ARTHROPLASTY Right 08/27/2013   Procedure: RIGHT TOTAL KNEE ARTHROPLASTY;  Surgeon: Gearlean Alf, MD;  [facility];  Service: Orthopedics;  Laterality: Right;  . TUBAL  LIGATION    . VAGINAL HYSTERECTOMY     tah/bso  . WISDOM TOOTH EXTRACTION      There were no vitals filed for this visit.      Subjective Assessment - 08/24/16 0917    Subjective Did great after last Rx. Pain today is 2/10                         [facility] Adult PT Treatment/Exercise - 08/24/16 0001      Exercises   Exercises Shoulder     Shoulder Exercises: Pulleys   Flexion --  x 5 mins     Modalities   Modalities Electrical Stimulation;Moist Heat     Moist Heat Therapy   Number Minutes Moist Heat 15 Minutes   Moist Heat Location Cervical     Electrical Stimulation   Electrical Stimulation Location RT shldr/UT Premod x 15 mins, 5 sec on/off 80-150hz    Electrical Stimulation Goals Pain;Other (comment)     Manual Therapy   Manual Therapy Passive ROM;Soft tissue mobilization   Passive ROM cervical PROM and light manual traction, STW to LT and RT paras supinf  [facility] Goals - 08/04/16 1520      CC Long Term Goal  #1   Title Increase cervical AROM to be WNL for improved mobility with daily tasks including driving.   Time 4   Period Weeks   Status New     CC Long Term Goal  #2   Title Report overall neck and arm pain decreased by >/= 50% to tolerate daily tasks with less discomfort.   Time 4   Period Weeks   Status New     CC Long Term Goal  #3   Title Increase right shoulder flexion and abduction to >/= 155 degreesf or increased ease reaching.   Time 4   Period Weeks   Status New     CC Long Term Goal  #4   Title Decrease Quick DASH score to </= 15 for improved functional use of right upper extremity.   Baseline 22.73   Time 4   Period Weeks   Status New            Plan - 08/24/16 1034    Clinical Impression Statement Pt arrived to clinic with less pain in neck and RT UT. She responded well to Rx and had decreased tenderness and trigger points along RT side cervical paras. She was still  at 55 degrees for RT cervical Rotation. Normal response to modalities today.   Rehab Potential Excellent   Clinical Impairments Affecting Rehab Potential Recent breast cancer and will be undergoing daily radiation   PT Frequency 2x / week   PT Duration 4 weeks   PT Treatment/Interventions ADLs/Self Care Home Management;Patient/family education;Passive range of motion;Manual techniques;Electrical Stimulation;Therapeutic activities;Therapeutic exercise;Moist Heat;Cryotherapy;Traction   PT Next Visit Plan Soft tissue work right upper rtap and cervical paraspinals, PROM right shoulder, postural exercises, heat/stim (Premod) to bilateral upper traps   PT Home Exercise Plan Issued breast surgery post op shoulder ROM HEP   Consulted and Agree with Plan of Care Patient      Patient will benefit from skilled therapeutic intervention in order to improve the following deficits and impairments:  Impaired UE functional use, Pain, Increased fascial restricitons, Postural dysfunction, Decreased range of motion  Visit Diagnosis: Cervicalgia  Radiculopathy, cervical region  Abnormal posture  Stiffness of right shoulder, not elsewhere classified     Problem List Patient Active Problem List   Diagnosis Date Noted  . Osteoporosis 06/20/2016  . Malignant neoplasm of lower-inner quadrant of right breast of female, estrogen receptor positive ([place]) 06/11/2016  . Cephalalgia 03/04/2016  . Diplopia 09/02/2015  . Neck pain on right side 09/02/2015  . Balance disorder 06/05/2015  . Neck pain 05/15/2015  . Headache 05/15/2015  . Postoperative anemia due to acute blood loss 08/28/2013  . OA (osteoarthritis) of knee 04/27/2013  . 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 Vaginal atrophy 05/25/2012  . CONSTIPATION 08/27/2009  . ULCERATION OF INTESTINE 08/27/2009    Darrian Goodwill,CHRIS, PTA 08/24/2016, 10:39 AM  [facility] [place],  [place], [address] Phone: [phone]   Fax:  [phone]  Name: Maria Jackson MRN: [id number] Date of Birth: 1945/10/03

## 2016-08-25 ENCOUNTER — Ambulatory Visit
Admission: RE | Admit: 2016-08-25 | Discharge: 2016-08-25 | Disposition: A | Discharge status: Home / Self Care | Payer: Medicare Other | Source: Ambulatory Visit | Attending: Radiation Oncology | Admitting: Radiation Oncology

## 2016-08-25 DIAGNOSIS — Z882 Allergy status to sulfonamides status: Secondary | ICD-10-CM | POA: Diagnosis not present

## 2016-08-25 DIAGNOSIS — Z510 Encounter for antineoplastic radiation therapy: Secondary | ICD-10-CM | POA: Diagnosis not present

## 2016-08-25 DIAGNOSIS — Z880 Allergy status to penicillin: Secondary | ICD-10-CM | POA: Diagnosis not present

## 2016-08-25 DIAGNOSIS — C50311 Malignant neoplasm of lower-inner quadrant of right female breast: Secondary | ICD-10-CM | POA: Diagnosis not present

## 2016-08-25 DIAGNOSIS — Z170 Estrogen receptor positive status [ER+]: Secondary | ICD-10-CM | POA: Diagnosis not present

## 2016-08-25 DIAGNOSIS — M8580 Other specified disorders of bone density and structure, unspecified site: Secondary | ICD-10-CM | POA: Diagnosis not present

## 2016-08-25 DIAGNOSIS — I7300 Raynaud's syndrome without gangrene: Secondary | ICD-10-CM | POA: Diagnosis not present

## 2016-08-25 DIAGNOSIS — Z96653 Presence of artificial knee joint, bilateral: Secondary | ICD-10-CM | POA: Diagnosis not present

## 2016-08-26 ENCOUNTER — Ambulatory Visit: Payer: Medicare Other | Admitting: *Deleted

## 2016-08-26 ENCOUNTER — Ambulatory Visit
Admission: RE | Admit: 2016-08-26 | Discharge: 2016-08-26 | Disposition: A | Discharge status: Home / Self Care | Payer: Medicare Other | Source: Ambulatory Visit | Attending: Radiation Oncology | Admitting: Radiation Oncology

## 2016-08-26 DIAGNOSIS — R293 Abnormal posture: Secondary | ICD-10-CM | POA: Diagnosis not present

## 2016-08-26 DIAGNOSIS — M8580 Other specified disorders of bone density and structure, unspecified site: Secondary | ICD-10-CM | POA: Diagnosis not present

## 2016-08-26 DIAGNOSIS — M25611 Stiffness of right shoulder, not elsewhere classified: Secondary | ICD-10-CM

## 2016-08-26 DIAGNOSIS — M542 Cervicalgia: Secondary | ICD-10-CM | POA: Diagnosis not present

## 2016-08-26 DIAGNOSIS — Z882 Allergy status to sulfonamides status: Secondary | ICD-10-CM | POA: Diagnosis not present

## 2016-08-26 DIAGNOSIS — Z170 Estrogen receptor positive status [ER+]: Secondary | ICD-10-CM | POA: Diagnosis not present

## 2016-08-26 DIAGNOSIS — Z96653 Presence of artificial knee joint, bilateral: Secondary | ICD-10-CM | POA: Diagnosis not present

## 2016-08-26 DIAGNOSIS — M5412 Radiculopathy, cervical region: Secondary | ICD-10-CM | POA: Diagnosis not present

## 2016-08-26 DIAGNOSIS — C50311 Malignant neoplasm of lower-inner quadrant of right female breast: Secondary | ICD-10-CM | POA: Diagnosis not present

## 2016-08-26 DIAGNOSIS — Z510 Encounter for antineoplastic radiation therapy: Secondary | ICD-10-CM | POA: Diagnosis not present

## 2016-08-26 DIAGNOSIS — Z880 Allergy status to penicillin: Secondary | ICD-10-CM | POA: Diagnosis not present

## 2016-08-26 DIAGNOSIS — I7300 Raynaud's syndrome without gangrene: Secondary | ICD-10-CM | POA: Diagnosis not present

## 2016-08-26 NOTE — Therapy (Signed)
[facility] [place], [place], [address] Phone: [phone]   Fax:  [phone]  Physical Therapy Treatment  Patient Details  Name: Maria Jackson MRN: [id number] Date of Birth: 08-10-1945 Referring Provider: Dr. Autumn Messing  Encounter Date: 08/26/2016      PT End of Session - 08/26/16 1126    Visit Number 6   Number of Visits 8   Date for PT Re-Evaluation 09/01/16   PT Start Time 0900   PT Stop Time 0951   PT Time Calculation (min) 51 min      Past Medical History:  Diagnosis Date  . Allergy   . Arthritis    knees  . 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 Cancer ([place]) 11/05/11   SQUAMOS CELL CARCINOMA - LEFT CALF  . Cataract   . Chronic lower back pain   . Concussion   . Diplopia    left eye - when she looks down  . Head injury, closed, with concussion   . Insomnia    NO LONGER AN ISSUE  . Malignant neoplasm of lower-inner quadrant of right female breast ([place]) 06/11/2016  . Osteopenia   . Raynaud disease   . Seasonal allergies   . Spondylolisthesis    lumbar    Past Surgical History:  Procedure Laterality Date  . APPENDECTOMY    . BREAST LUMPECTOMY WITH RADIOACTIVE SEED LOCALIZATION Right 07/14/2016   Procedure: RIGHT BREAST LUMPECTOMY WITH RADIOACTIVE SEED LOCALIZATION;  Surgeon: Autumn Messing III, MD;  Location: [facility];  Service: General;  Laterality: Right;  . CHOLECYSTECTOMY    . COLONOSCOPY    . ENDOSCOPIC VEIN LASER TREATMENT     calf riht and left  . FOOT NEUROMA SURGERY     bilateral  . JOINT REPLACEMENT    . TONSILLECTOMY    . TOTAL KNEE ARTHROPLASTY Left 04/27/2013   Procedure: LEFT TOTAL KNEE ARTHROPLASTY;  Surgeon: Gearlean Alf, MD;  Location: [facility];  Service: Orthopedics;  Laterality: Left;  . TOTAL KNEE ARTHROPLASTY Right 08/27/2013   Procedure: RIGHT TOTAL KNEE ARTHROPLASTY;  Surgeon: Gearlean Alf, MD;  Location: [facility];  Service: Orthopedics;  Laterality: Right;  . TUBAL  LIGATION    . VAGINAL HYSTERECTOMY     tah/bso  . WISDOM TOOTH EXTRACTION      There were no vitals filed for this visit.      Subjective Assessment - 08/26/16 0916    Subjective Did great after last Rx. Pain today is 2/10   Pertinent History Right lumpectomy for DCIS 07/14/16; radiation begins 08/09/16.   Patient Stated Goals Get arm back to normal   Currently in Pain? Yes   Pain Score 2    Pain Location Shoulder   Pain Orientation Right   Pain Descriptors / Indicators Aching   Pain Type Surgical pain   Pain Onset In the past 7 days                         [facility] Adult PT Treatment/Exercise - 08/26/16 0001      Shoulder Exercises: Pulleys   Flexion --  x 5 mins     Shoulder Exercises: ROM/Strengthening   UBE (Upper Arm Bike) x5 mins 120 RPMs     Modalities   Modalities Electrical Stimulation;Moist Heat     Moist Heat Therapy   Number Minutes Moist Heat 15 Minutes   Moist Heat Location Cervical  Acupuncturist Location RT shldr/UT Premod x 15 mins, 5 sec on/off 80-150hz    Electrical Stimulation Goals Pain;Other (comment)     Manual Therapy   Manual Therapy Passive ROM;Soft tissue mobilization   Soft tissue mobilization STW/TPR  to RT UT and cervical  paras in supine   Passive ROM cervical PROM and light manual traction, STW to LT and RT paras supinf. PROM to RT shldr for flexion                        Long Term Clinic Goals - 08/04/16 1520      CC Long Term Goal  #1   Title Increase cervical AROM to be WNL for improved mobility with daily tasks including driving.   Time 4   Period Weeks   Status New     CC Long Term Goal  #2   Title Report overall neck and arm pain decreased by >/= 50% to tolerate daily tasks with less discomfort.   Time 4   Period Weeks   Status New     CC Long Term Goal  #3   Title Increase right shoulder flexion and abduction to >/= 155 degreesf or increased ease  reaching.   Time 4   Period Weeks   Status New     CC Long Term Goal  #4   Title Decrease Quick DASH score to </= 15 for improved functional use of right upper extremity.   Baseline 22.73   Time 4   Period Weeks   Status New            Plan - 08/26/16 1127    Clinical Impression Statement Pt arrived to clinic today doing a little better with less pain in neck and RT shldr. Her RT shldr ROM was equal to Left, but had discomfort in RT [place] shldr. Her ROM was at 55 degrees for rotation but decreased discomfort. She continues to have a TP in RT UT that will not totally release LTGs are ongoing   Rehab Potential Excellent   Clinical Impairments Affecting Rehab Potential Recent breast cancer and will be undergoing daily radiation   PT Frequency 2x / week   PT Duration 4 weeks   PT Treatment/Interventions ADLs/Self Care Home Management;Patient/family education;Passive range of motion;Manual techniques;Electrical Stimulation;Therapeutic activities;Therapeutic exercise;Moist Heat;Cryotherapy;Traction   PT Next Visit Plan Soft tissue work right upper rtap and cervical paraspinals, PROM right shoulder, postural exercises, heat/stim (Premod) to bilateral upper traps.   DN ?   PT Home Exercise Plan Issued breast surgery post op shoulder ROM HEP   Consulted and Agree with Plan of Care Patient      Patient will benefit from skilled therapeutic intervention in order to improve the following deficits and impairments:  Impaired UE functional use, Pain, Increased fascial restricitons, Postural dysfunction, Decreased range of motion  Visit Diagnosis: Cervicalgia  Radiculopathy, cervical region  Abnormal posture  Stiffness of right shoulder, not elsewhere classified     Problem List Patient Active Problem List   Diagnosis Date Noted  . Osteoporosis 06/20/2016  . Malignant neoplasm of lower-inner quadrant of right breast of female, estrogen receptor positive ([place]) 06/11/2016  .  Cephalalgia 03/04/2016  . Diplopia 09/02/2015  . Neck pain on right side 09/02/2015  . Balance disorder 06/05/2015  . Neck pain 05/15/2015  . Headache 05/15/2015  . Postoperative anemia due to acute blood loss 08/28/2013  . OA (osteoarthritis) of knee 04/27/2013  .  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 Vaginal atrophy 05/25/2012  . CONSTIPATION 08/27/2009  . ULCERATION OF INTESTINE 08/27/2009    Andee Chivers,CHRIS, PTA 08/26/2016, 11:39 AM  [facility] [address] [place], [place], [address] Phone: [phone]   Fax:  [phone]  Name: Maria Jackson MRN: [id number] Date of Birth: [birth year]

## 2016-08-27 ENCOUNTER — Ambulatory Visit
Admission: RE | Admit: 2016-08-27 | Discharge: 2016-08-27 | Disposition: A | Discharge status: Home / Self Care | Payer: Medicare Other | Source: Ambulatory Visit | Attending: Radiation Oncology | Admitting: Radiation Oncology

## 2016-08-27 ENCOUNTER — Encounter: Payer: Self-pay | Admitting: Radiation Oncology

## 2016-08-27 VITALS — BP 123/60 | HR 61 | Temp 97.9°F | Resp 20 | Wt 151.2 lb

## 2016-08-27 DIAGNOSIS — Z170 Estrogen receptor positive status [ER+]: Secondary | ICD-10-CM | POA: Diagnosis not present

## 2016-08-27 DIAGNOSIS — Z882 Allergy status to sulfonamides status: Secondary | ICD-10-CM | POA: Diagnosis not present

## 2016-08-27 DIAGNOSIS — C50311 Malignant neoplasm of lower-inner quadrant of right female breast: Secondary | ICD-10-CM | POA: Diagnosis not present

## 2016-08-27 DIAGNOSIS — I7300 Raynaud's syndrome without gangrene: Secondary | ICD-10-CM | POA: Diagnosis not present

## 2016-08-27 DIAGNOSIS — M8580 Other specified disorders of bone density and structure, unspecified site: Secondary | ICD-10-CM | POA: Diagnosis not present

## 2016-08-27 DIAGNOSIS — Z96653 Presence of artificial knee joint, bilateral: Secondary | ICD-10-CM | POA: Diagnosis not present

## 2016-08-27 DIAGNOSIS — Z510 Encounter for antineoplastic radiation therapy: Secondary | ICD-10-CM | POA: Diagnosis not present

## 2016-08-27 DIAGNOSIS — Z880 Allergy status to penicillin: Secondary | ICD-10-CM | POA: Diagnosis not present

## 2016-08-27 NOTE — Progress Notes (Signed)
Weekly rad txs right breast, not treated as yet, no c/o pain, uses radiaplex bid, appetite good, no fatiue or pain 2:36 PM BP 123/60 (BP Location: Left Arm, Patient Position: Sitting, Cuff Size: Normal)   Pulse 61   Temp 97.9 F (36.6 C) (Oral)   Resp 20   Wt 151 lb 3.2 oz (68.6 kg)   BMI 26.78 kg/m   Wt Readings from Last 3 Encounters:  08/27/16 151 lb 3.2 oz (68.6 kg)  08/20/16 151 lb 9.6 oz (68.8 kg)  08/13/16 152 lb 9.6 oz (69.2 kg)

## 2016-08-29 NOTE — Progress Notes (Signed)
  Radiation Oncology         [phone]) [phone] ________________________________  Name: Maria Jackson MRN: [id number]  Date: 08/18/2016  DOB: [birth year]  Complex simulation note  The patient has undergone complex simulation for her upcoming boost treatment for her diagnosis of right sided breast cancer. The patient has initially been planned to receive 42.5 Gy. The patient will now receive a 7.5 Gy boost to the seroma cavity which has been contoured. This will be accomplished using an en face electron field. Based on the depth of the target area, 12 MeV electrons will be used. The patient's final total dose therefore will be 50 Gy. A complex isodose plan from the electron [facility] calculation is requested for the boost treatment.   _______________________________  [name], MD, PhD

## 2016-08-29 NOTE — Progress Notes (Signed)
Department of Radiation Oncology  Phone:  [phone] Fax:        [phone]  Weekly Treatment Note    Name: Maria Jackson Date: 08/29/2016 MRN: [id number] DOB: 11/24/1945   Diagnosis:     ICD-9-CM ICD-10-CM   1. Malignant neoplasm of lower-inner quadrant of right breast of female, estrogen receptor positive ([place]) 174.3 C50.311    V86.0 Z17.0      Current dose: 37.5 Gy  Current fraction: 15   MEDICATIONS: Current Outpatient Prescriptions  Medication Sig Dispense Refill  . aspirin 81 MG tablet Take 81 mg by mouth daily.    Marland Kitchen azithromycin (ZITHROMAX) 500 MG tablet Take 500 mg by mouth as directed. 500 mg by mouth 1 hour prior to dental work.    . cholecalciferol (VITAMIN D) 1000 units tablet Take 2,000 Units by mouth daily.    . cycloSPORINE (RESTASIS) 0.05 % ophthalmic emulsion Place 1 drop into both eyes 2 (two) times daily.    . diphenhydrAMINE (BENADRYL) 25 mg capsule Take 25 mg by mouth every 6 (six) hours as needed (for congestion/allergies.).    Marland Kitchen hyaluronate sodium (RADIAPLEXRX) GEL Apply 1 application topically 2 (two) times daily.    Marland Kitchen loratadine (CLARITIN) 10 MG tablet Take 10 mg by mouth as needed for allergies.    . Lutein 6 MG CAPS Take 6 mg by mouth 2 (two) times daily.     . Magnesium 250 MG TABS Take 250 mg by mouth 2 (two) times daily.     . Multiple Vitamin (MULTIVITAMIN WITH MINERALS) TABS tablet Take 1 tablet by mouth daily. Centrum Silver for Women    . nitrofurantoin, macrocrystal-monohydrate, (MACROBID) 100 MG capsule Take 100 mg by mouth daily as needed (for UTI prevention).    . non-metallic deodorant Jethro Poling) MISC Apply 1 application topically daily as needed.    . Omega-3 Fatty Acids (FISH OIL) 1200 MG CAPS Take 1,200 mg by mouth daily.    . Probiotic Product (ALIGN) 4 MG CAPS Take 4 mg by mouth at bedtime.    . sodium chloride (OCEAN) 0.65 % nasal spray Place 1 spray into the nose 2 (two) times daily as needed for congestion.     .  triamcinolone (NASACORT AQ) 55 MCG/ACT AERO nasal inhaler Place 2 sprays into the nose daily as needed (for allergies.).    Marland Kitchen Vaginal Lubricant (REPLENS VA) Place 1 application vaginally every 3 (three) days.     No current facility-administered medications for this encounter.      ALLERGIES: Bee venom; Ibuprofen; Cephalexin; Penicillins; and Sulfonamide derivatives   LABORATORY DATA:  Lab Results  Component Value Date   WBC 5.5 07/14/2016   HGB 13.7 07/14/2016   HCT 40.1 07/14/2016   MCV 100.5 (H) 07/14/2016   PLT 338 07/14/2016   Lab Results  Component Value Date   NA 138 07/08/2016   K 4.2 07/08/2016   CL 101 07/08/2016   CO2 30 07/08/2016   Lab Results  Component Value Date   ALT 18 06/16/2016   AST 22 06/16/2016   ALKPHOS 74 06/16/2016   BILITOT 0.66 06/16/2016     NARRATIVE: Maria Jackson was seen today for weekly treatment management. The chart was checked and the patient's films were reviewed.  The patient states that she continues to do quite well. No significant fatigue this week which is improved.  Weekly rad txs right breast, not treated as yet, no c/o pain, uses radiaplex bid, appetite good, no fatiue or pain  2:31 PM BP 123/60 (BP Location: Left Arm, Patient Position: Sitting, Cuff Size: Normal)   Pulse 61   Temp 97.9 F (36.6 C) (Oral)   Resp 20   Wt 151 lb 3.2 oz (68.6 kg)   BMI 26.78 kg/m   Wt Readings from Last 3 Encounters:  08/27/16 151 lb 3.2 oz (68.6 kg)  08/20/16 151 lb 9.6 oz (68.8 kg)  08/13/16 152 lb 9.6 oz (69.2 kg)    PHYSICAL EXAMINATION: weight is 151 lb 3.2 oz (68.6 kg). Her oral temperature is 97.9 F (36.6 C). Her blood pressure is 123/60 and her pulse is 61. Her respiration is 20.        ASSESSMENT: The patient is doing satisfactorily with treatment.  PLAN: We will continue with the patient's radiation treatment as planned.

## 2016-08-30 ENCOUNTER — Ambulatory Visit
Admission: RE | Admit: 2016-08-30 | Discharge: 2016-08-30 | Disposition: A | Discharge status: Home / Self Care | Payer: Medicare Other | Source: Ambulatory Visit | Attending: Radiation Oncology | Admitting: Radiation Oncology

## 2016-08-30 ENCOUNTER — Ambulatory Visit: Payer: Medicare Other | Admitting: Radiation Oncology

## 2016-08-30 DIAGNOSIS — Z170 Estrogen receptor positive status [ER+]: Secondary | ICD-10-CM | POA: Diagnosis not present

## 2016-08-30 DIAGNOSIS — I7300 Raynaud's syndrome without gangrene: Secondary | ICD-10-CM | POA: Diagnosis not present

## 2016-08-30 DIAGNOSIS — Z510 Encounter for antineoplastic radiation therapy: Secondary | ICD-10-CM | POA: Diagnosis not present

## 2016-08-30 DIAGNOSIS — M8580 Other specified disorders of bone density and structure, unspecified site: Secondary | ICD-10-CM | POA: Diagnosis not present

## 2016-08-30 DIAGNOSIS — Z880 Allergy status to penicillin: Secondary | ICD-10-CM | POA: Diagnosis not present

## 2016-08-30 DIAGNOSIS — Z882 Allergy status to sulfonamides status: Secondary | ICD-10-CM | POA: Diagnosis not present

## 2016-08-30 DIAGNOSIS — C50311 Malignant neoplasm of lower-inner quadrant of right female breast: Secondary | ICD-10-CM | POA: Diagnosis not present

## 2016-08-30 DIAGNOSIS — Z96653 Presence of artificial knee joint, bilateral: Secondary | ICD-10-CM | POA: Diagnosis not present

## 2016-08-31 ENCOUNTER — Ambulatory Visit
Admission: RE | Admit: 2016-08-31 | Discharge: 2016-08-31 | Disposition: A | Discharge status: Home / Self Care | Payer: Medicare Other | Source: Ambulatory Visit | Attending: Radiation Oncology | Admitting: Radiation Oncology

## 2016-08-31 ENCOUNTER — Ambulatory Visit: Payer: Medicare Other | Admitting: Physical Therapy

## 2016-08-31 DIAGNOSIS — Z880 Allergy status to penicillin: Secondary | ICD-10-CM | POA: Diagnosis not present

## 2016-08-31 DIAGNOSIS — C50311 Malignant neoplasm of lower-inner quadrant of right female breast: Secondary | ICD-10-CM | POA: Diagnosis not present

## 2016-08-31 DIAGNOSIS — Z170 Estrogen receptor positive status [ER+]: Secondary | ICD-10-CM | POA: Diagnosis not present

## 2016-08-31 DIAGNOSIS — M542 Cervicalgia: Secondary | ICD-10-CM | POA: Diagnosis not present

## 2016-08-31 DIAGNOSIS — R293 Abnormal posture: Secondary | ICD-10-CM | POA: Diagnosis not present

## 2016-08-31 DIAGNOSIS — M5412 Radiculopathy, cervical region: Secondary | ICD-10-CM | POA: Diagnosis not present

## 2016-08-31 DIAGNOSIS — M25611 Stiffness of right shoulder, not elsewhere classified: Secondary | ICD-10-CM | POA: Diagnosis not present

## 2016-08-31 DIAGNOSIS — Z510 Encounter for antineoplastic radiation therapy: Secondary | ICD-10-CM | POA: Diagnosis not present

## 2016-08-31 DIAGNOSIS — Z96653 Presence of artificial knee joint, bilateral: Secondary | ICD-10-CM | POA: Diagnosis not present

## 2016-08-31 DIAGNOSIS — I7300 Raynaud's syndrome without gangrene: Secondary | ICD-10-CM | POA: Diagnosis not present

## 2016-08-31 DIAGNOSIS — M8580 Other specified disorders of bone density and structure, unspecified site: Secondary | ICD-10-CM | POA: Diagnosis not present

## 2016-08-31 DIAGNOSIS — Z882 Allergy status to sulfonamides status: Secondary | ICD-10-CM | POA: Diagnosis not present

## 2016-08-31 NOTE — Patient Instructions (Signed)
  Flexibility: Upper Trapezius Stretch   Gently grasp right side of head while reaching behind back with other hand. Tilt head away until a gentle stretch is felt. Hold 30 seconds. Repeat 3 times per set. Do 2 sessions per day.  http://orth.exer.us/340   Levator Stretch   Grasp seat or sit on hand on side to be stretched. Turn head toward other side and look down. Use hand on head to gently stretch neck in that position. Hold _30___ seconds. Repeat on other side. Repeat 3 times. Do 2 sessions per day.   ALSO: IN THE SIDE BENT POSITION OF PICTURE #1, TURN YOUR HEAD UPWARD TO FEEL A STRETCH IN THE FRONT OF YOUR NECK. HOLD FOR SAME TIME AND FREQUENCY.  Madelyn Flavors, PT 08/31/16 10:33 AM [facility] [address] [place], [place], [address] Phone: [phone]   Fax:  [phone]

## 2016-08-31 NOTE — Therapy (Signed)
[facility] [place], [place], [address] Phone: [phone]   Fax:  [phone]  Physical Therapy Treatment  Patient Details  Name: Maria Jackson MRN: [id number] Date of Birth: 1945/08/07 Referring Provider: Dr. Autumn Messing  Encounter Date: 08/31/2016      PT End of Session - 08/31/16 0951    Visit Number 7   Number of Visits 12   Date for PT Re-Evaluation 09/21/16   PT Start Time 0950   PT Stop Time 1046   PT Time Calculation (min) 56 min   Activity Tolerance Patient tolerated treatment well   Behavior During Therapy [facility] for tasks assessed/performed      Past Medical History:  Diagnosis Date  . Allergy   . Arthritis    knees  . 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 Cancer ([place]) 11/05/11   SQUAMOS CELL CARCINOMA - LEFT CALF  . Cataract   . Chronic lower back pain   . Concussion   . Diplopia    left eye - when she looks down  . Head injury, closed, with concussion   . Insomnia    NO LONGER AN ISSUE  . Malignant neoplasm of lower-inner quadrant of right female breast ([place]) 06/11/2016  . Osteopenia   . Raynaud disease   . Seasonal allergies   . Spondylolisthesis    lumbar    Past Surgical History:  Procedure Laterality Date  . APPENDECTOMY    . BREAST LUMPECTOMY WITH RADIOACTIVE SEED LOCALIZATION Right 07/14/2016   Procedure: RIGHT BREAST LUMPECTOMY WITH RADIOACTIVE SEED LOCALIZATION;  Surgeon: Autumn Messing III, MD;  Location: [facility];  Service: General;  Laterality: Right;  . CHOLECYSTECTOMY    . COLONOSCOPY    . ENDOSCOPIC VEIN LASER TREATMENT     calf riht and left  . FOOT NEUROMA SURGERY     bilateral  . JOINT REPLACEMENT    . TONSILLECTOMY    . TOTAL KNEE ARTHROPLASTY Left 04/27/2013   Procedure: LEFT TOTAL KNEE ARTHROPLASTY;  Surgeon: Gearlean Alf, MD;  [facility];  Service: Orthopedics;  Laterality: Left;  . TOTAL KNEE ARTHROPLASTY Right 08/27/2013   Procedure: RIGHT TOTAL KNEE  ARTHROPLASTY;  Surgeon: Gearlean Alf, MD;  Location[facility];  Service: Orthopedics;  Laterality: Right;  . TUBAL LIGATION    . VAGINAL HYSTERECTOMY     tah/bso  . WISDOM TOOTH EXTRACTION      There were no vitals filed for this visit.      Subjective Assessment - 08/31/16 0954    Subjective Patient presents today with reports that her last radiation treatment for radiation is 09/03/16.    Pertinent History Right lumpectomy for DCIS 07/14/16; radiation begins 08/09/16.   Patient Stated Goals Get arm back to normal   Currently in Pain? Yes   Pain Score 2    Pain Location Shoulder   Pain Orientation Right   Pain Descriptors / Indicators Aching   Pain Type Surgical pain   Pain Radiating Towards R UT into shoulder   Pain Onset More than a month ago   Pain Frequency Intermittent   Aggravating Factors  Lying in position for radiation (arm goes to sleep)   Pain Relieving Factors unknown   Effect of Pain on Daily Activities aggravates her            [facility] PT Assessment - 08/31/16 0001      AROM   Right Shoulder Flexion 159 Degrees  full  flexion in supine   Right Shoulder ABduction 164 Degrees   Right Shoulder External Rotation --  full in supine   Left Shoulder Flexion --   Left Shoulder ABduction --   Left Shoulder External Rotation --   Cervical - Right Side Bend 50% limited    Cervical - Left Side Bend 50% limited    Cervical - Right Rotation 25% limited    Cervical - Left Rotation 50% limited                     [facility] Adult PT Treatment/Exercise - 08/31/16 0001      Shoulder Exercises: Pulleys   Flexion 3 minutes     Shoulder Exercises: ROM/Strengthening   UBE (Upper Arm Bike) x5 mins 90 RPMs     Shoulder Exercises: Stretch   Other Shoulder Stretches cervical tension stretches for scalenes, UT, levator scap in sitting x 20 seconds each B     Modalities   Modalities Electrical Stimulation     Moist Heat Therapy   Number Minutes Moist Heat 15  Minutes   Moist Heat Location Cervical     Electrical Stimulation   Electrical Stimulation Location RT shldr/UT Premod x 15 mins, 5 sec on/off 80-_0    Electrical Stimulation Goals Pain;Other (comment)     Manual Therapy   Manual Therapy Soft tissue mobilization;Myofascial release   Soft tissue mobilization To R UT and parascapular muscles   Myofascial Release TPR to R UT, Levator scapula, Infraspinatus, rhomboids                PT Education - 08/31/16 1252    Education provided Yes   Education Details HEP   Person(s) Educated Patient   Methods Explanation;Demonstration;Handout;Tactile cues;Verbal cues   Comprehension Verbalized understanding;Returned demonstration;Verbal cues required;Tactile cues required                Long Term Clinic Goals - 08/04/16 1520      CC Long Term Goal  #1   Title Increase cervical AROM to be WNL for improved mobility with daily tasks including driving.   Time 4   Period Weeks   Status Ongoing     CC Long Term Goal  #2   Title Report overall neck and arm pain decreased by >/= 50% to tolerate daily tasks with less discomfort.   Time 4   Period Weeks   Status Achieved     CC Long Term Goal  #3   Title Increase right shoulder flexion and abduction to >/= 155 degreesf or increased ease reaching.   Time 4   Period Weeks   Status Achieved     CC Long Term Goal  #4   Title Decrease Quick DASH score to </= 15 for improved functional use of right upper extremity.   Baseline 22.73   Time 4   Period Weeks   Status Ongoing            Plan - 08/31/16 1308    Clinical Impression Statement Patient presents today with reports of 50-60% decrease in neck and R shoulder pain overall. She was hoping to be dry needled today, but no order at this time and PT needs approval as surgery date is only 7 weeks ago. She responded well to manual MFR however she would definitely benefit from DN as she has multiple TPs throughout neck and  shoulder girdle. She also has significant limitations in cervical ROM which may be helped by DN as well. She  was issued neck stretches today. R shoulder ROM has improved, and 2/4 LTGs have been met at this time.    Rehab Potential Excellent   Clinical Impairments Affecting Rehab Potential Recent breast cancer and will be undergoing daily radiation   PT Frequency 2x / week   PT Duration 3 weeks   PT Treatment/Interventions ADLs/Self Care Home Management;Patient/family education;Passive range of motion;Manual techniques;Electrical Stimulation;Therapeutic activities;Therapeutic exercise;Moist Heat;Cryotherapy;Traction;Dry needling   PT Next Visit Plan Soft tissue work right upper rtap and cervical paraspinals, PROM right shoulder, postural exercises, heat/stim (Premod) to bilateral upper traps.   DN if approved by surgeon.   PT Home Exercise Plan cervical tension stretches   Consulted and Agree with Plan of Care Patient      Patient will benefit from skilled therapeutic intervention in order to improve the following deficits and impairments:  Impaired UE functional use, Pain, Increased fascial restricitons, Postural dysfunction, Decreased range of motion  Visit Diagnosis: Cervicalgia - Plan: PT plan of care cert/re-cert     Problem List Patient Active Problem List   Diagnosis Date Noted  . Osteoporosis 06/20/2016  . Malignant neoplasm of lower-inner quadrant of right breast of female, estrogen receptor positive ([place]) 06/11/2016  . Cephalalgia 03/04/2016  . Diplopia 09/02/2015  . Neck pain on right side 09/02/2015  . Balance disorder 06/05/2015  . Neck pain 05/15/2015  . Headache 05/15/2015  . Postoperative anemia due to acute blood loss 08/28/2013  . OA (osteoarthritis) of knee 04/27/2013  . 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 Vaginal atrophy 05/25/2012  . CONSTIPATION 08/27/2009  . ULCERATION OF INTESTINE 08/27/2009    Madelyn Flavors PT 08/31/2016, 1:22 PM  [facility] [address] [place], [place], [address] Phone: [phone]   Fax:  [phone]  Name: TASHAWN GREFF MRN: [id number] Date of Birth: 06/08/1946

## 2016-09-01 ENCOUNTER — Ambulatory Visit
Admission: RE | Admit: 2016-09-01 | Discharge: 2016-09-01 | Disposition: A | Discharge status: Home / Self Care | Payer: Medicare Other | Source: Ambulatory Visit | Attending: Radiation Oncology | Admitting: Radiation Oncology

## 2016-09-01 ENCOUNTER — Ambulatory Visit (HOSPITAL_BASED_OUTPATIENT_CLINIC_OR_DEPARTMENT_OTHER): Payer: Medicare Other | Admitting: Oncology

## 2016-09-01 VITALS — BP 144/66 | HR 66 | Temp 97.5°F | Resp 18 | Ht 63.0 in | Wt 151.2 lb

## 2016-09-01 DIAGNOSIS — Z882 Allergy status to sulfonamides status: Secondary | ICD-10-CM | POA: Diagnosis not present

## 2016-09-01 DIAGNOSIS — Z880 Allergy status to penicillin: Secondary | ICD-10-CM | POA: Diagnosis not present

## 2016-09-01 DIAGNOSIS — Z170 Estrogen receptor positive status [ER+]: Secondary | ICD-10-CM | POA: Diagnosis not present

## 2016-09-01 DIAGNOSIS — D0511 Intraductal carcinoma in situ of right breast: Secondary | ICD-10-CM

## 2016-09-01 DIAGNOSIS — M810 Age-related osteoporosis without current pathological fracture: Secondary | ICD-10-CM

## 2016-09-01 DIAGNOSIS — C50311 Malignant neoplasm of lower-inner quadrant of right female breast: Secondary | ICD-10-CM | POA: Diagnosis not present

## 2016-09-01 DIAGNOSIS — Z510 Encounter for antineoplastic radiation therapy: Secondary | ICD-10-CM | POA: Diagnosis not present

## 2016-09-01 DIAGNOSIS — M8580 Other specified disorders of bone density and structure, unspecified site: Secondary | ICD-10-CM | POA: Diagnosis not present

## 2016-09-01 DIAGNOSIS — Z96653 Presence of artificial knee joint, bilateral: Secondary | ICD-10-CM | POA: Diagnosis not present

## 2016-09-01 DIAGNOSIS — I7300 Raynaud's syndrome without gangrene: Secondary | ICD-10-CM | POA: Diagnosis not present

## 2016-09-01 MED ORDER — TAMOXIFEN CITRATE 20 MG PO TABS: 20.0000 mg | ORAL_TABLET | Freq: Every day | ORAL | 12 refills | Status: AC

## 2016-09-01 NOTE — Progress Notes (Signed)
[place]  Telephone:(336) [phone] Fax:([phone]     ID: Maria Jackson DOB: [birth year]  MR#: [id number]  LSL#:[id number]  Patient Care Team: Marton Redwood, MD as PCP - General (Internal Medicine) Terrance Mass, MD (Obstetrics and Gynecology) Autumn Messing III, MD as Consulting Physician (General Surgery) Chauncey Cruel, MD as Consulting Physician (Oncology) Kyung Rudd, MD as Consulting Physician (Radiation Oncology) Melina Schools, MD as Consulting Physician (Orthopedic Surgery) Marcial Pacas, MD as Consulting Physician (Neurology) Wallene Huh, DPM as Consulting Physician (Podiatry) Manus Gunning, MD as Consulting Physician (Gastroenterology) Chauncey Cruel, MD OTHER MD:  CHIEF COMPLAINT: Ductal carcinoma in situ  CURRENT TREATMENT: To start tamoxifen   BREAST CANCER HISTORY: From the original intake note:  [place]ier had suspicious right breast calcifications noted on her November 2016 mammography and this was followed up 11/12/2015 with a right diagnostic mammogram which again showed a 0.6 cm area of grouped vascular calcifications which appeared benign. On 06/03/2016 she underwent bilateral diagnostic mammography with special attention to the right breast, with tomosynthesis. The breast density was category B. In the right breast lower inner quadrant there was a group of linear calcifications now measuring 2 cm. Biopsy was performed, and showed (06/09/2016, [id number] [id number]) ductal carcinoma in situ, high-grade, estrogen receptor 95% positive, with strong staining intensity, just her and receptor negative.  The patient's subsequent history is as detailed below   INTERVAL HISTORY: Maria Jackson returns today for follow-up of her ductal carcinoma in situ. After her surgery she proceeded to radiation which she will complete later this week. She has generally done quite well with it. She has had some erythema but no desquamation. She has had mild problems with  fatigue.  REVIEW OF SYSTEMS: She is anxious about multiple problems that the first issue right now for her is her mother-in-law who is 62, with significant dementia as well as multiple physical problems. This wakes Maria Jackson up every night about 4 AM and "my mind start swirling". She tells me she received a Prolia dose through Dr. Trena Platt office and tolerated it well, but she had concerns regarding possible long-term side effects. She is enjoying her physical exercise. The therapists as suggested dry And she needs an order for that. Maria Jackson is walking around her neighborhood which she says is heavily. She is having problems with vaginal dryness and would like to start the Premarin again. She worries about the lab work she had through her primary care office and understands that she may be developing prediabetes. She wonders what is the optimal by she should follow. Aside from these issues a detailed review of systems today was stable  PAST MEDICAL HISTORY: Past Medical History:  Diagnosis Date  . Allergy   . Arthritis    knees  . Breast cancer ([place]) 06/09/2016   right DCIS  . Cancer ([place]) 11/05/11   SQUAMOS CELL CARCINOMA - LEFT CALF  . Cataract   . Chronic lower back pain   . Concussion   . Diplopia    left eye - when she looks down  . Head injury, closed, with concussion   . Insomnia    NO LONGER AN ISSUE  . Malignant neoplasm of lower-inner quadrant of right female breast ([place]) 06/11/2016  . Osteopenia   . Raynaud disease   . Seasonal allergies   . Spondylolisthesis    lumbar    PAST SURGICAL HISTORY: Past Surgical History:  Procedure Laterality Date  . APPENDECTOMY    . BREAST LUMPECTOMY WITH  RADIOACTIVE SEED LOCALIZATION Right 07/14/2016   Procedure: RIGHT BREAST LUMPECTOMY WITH RADIOACTIVE SEED LOCALIZATION;  Surgeon: Autumn Messing III, MD;  Location: [facility];  Service: General;  Laterality: Right;  . CHOLECYSTECTOMY    . COLONOSCOPY    . ENDOSCOPIC VEIN LASER TREATMENT     calf  riht and left  . FOOT NEUROMA SURGERY     bilateral  . JOINT REPLACEMENT    . TONSILLECTOMY    . TOTAL KNEE ARTHROPLASTY Left 04/27/2013   Procedure: LEFT TOTAL KNEE ARTHROPLASTY;  Surgeon: Gearlean Alf, MD;  [facility];  Service: Orthopedics;  Laterality: Left;  . TOTAL KNEE ARTHROPLASTY Right 08/27/2013   Procedure: RIGHT TOTAL KNEE ARTHROPLASTY;  Surgeon: Gearlean Alf, MD;  [facility];  Service: Orthopedics;  Laterality: Right;  . TUBAL LIGATION    . VAGINAL HYSTERECTOMY     tah/bso  . WISDOM TOOTH EXTRACTION      FAMILY HISTORY Family History  Problem Relation Age of Onset  . Diabetes Mother     CVA - 07/06/16  . Heart disease Mother   . Heart disease Father     Cancer of eye, Multiple Mylemona  . Colon cancer Neg Hx   . Esophageal cancer Neg Hx   . Stomach cancer Neg Hx   The patient's father died at the age of 106 with multiple myeloma. The patient's mother has a history of melanoma. She is 73 years old as of December 2017. The patient has one brother, no sisters. Her brother has a history of melanoma resection 2. There is no history of breast or ovarian cancer in the family to the patient's knowledge  GYNECOLOGIC HISTORY:  No LMP recorded. Patient has had a hysterectomy. Menarche age 71, first live birth age 71, the patient is [place] P2. 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 At home it's she and her husband [name] who is retired from Engineer, technical sales. Daughter Rocky Morel lives in [place] and is a Energy manager. Son [name] lives in [place] and he is a Marine scientist. The patient has a total of 4 grandchildren with one on the way    ADVANCED DIRECTIVES: In place including a living will   HEALTH  MAINTENANCE: Social History  Substance Use Topics  . Smoking status: Never Smoker  . Smokeless tobacco: Never Used  . Alcohol use 1.2 oz/week    2 Glasses of wine per week     Comment: red wine...  1/3 GLASS at dinner     Colonoscopy:05/20/2011/Repeat being considered later this year   PAP:  Bone density: 06/03/2016 at [facility], T score -2.5   Allergies  Allergen Reactions  . Bee Venom Itching, Swelling and Other (See Comments)    SWELLING REACTION UNSPECIFIED  REDNESS ITCHING  . Ibuprofen Other (See Comments)    Mouth ulcers  . Cephalexin     UNSPECIFIED REACTION   . Penicillins     Has patient had a PCN reaction causing immediate rash, facial/tongue/throat swelling, SOB or lightheadedness with hypotension:unsure Has patient had a PCN reaction causing severe rash involving mucus membranes or skin necrosis:unsure Has patient had a PCN reaction that required hospitalization:No Has patient had a PCN reaction occurring within the last 10 years:No Reaction unknown If all  of the above answers are "NO", then may proceed with Cephalosporin use.   . Sulfonamide Derivatives Rash    Current Outpatient Prescriptions  Medication Sig Dispense Refill  . denosumab (PROLIA) 60 MG/ML SOLN injection Inject 60 mg into the skin every 6 (six) months. Administer in upper arm, thigh, or abdomen    . aspirin 81 MG tablet Take 81 mg by mouth daily.    Marland Kitchen azithromycin (ZITHROMAX) 500 MG tablet Take 500 mg by mouth as directed. 500 mg by mouth 1 hour prior to dental work.    . cholecalciferol (VITAMIN D) 1000 units tablet Take 2,000 Units by mouth daily.    . cycloSPORINE (RESTASIS) 0.05 % ophthalmic emulsion Place 1 drop into both eyes 2 (two) times daily.    . diphenhydrAMINE (BENADRYL) 25 mg capsule Take 25 mg by mouth every 6 (six) hours as needed (for congestion/allergies.).    Marland Kitchen hyaluronate sodium (RADIAPLEXRX) GEL Apply 1 application topically 2 (two) times daily.    Marland Kitchen loratadine (CLARITIN) 10 MG  tablet Take 10 mg by mouth as needed for allergies.    . Lutein 6 MG CAPS Take 6 mg by mouth 2 (two) times daily.     . Magnesium 250 MG TABS Take 250 mg by mouth 2 (two) times daily.     . Multiple Vitamin (MULTIVITAMIN WITH MINERALS) TABS tablet Take 1 tablet by mouth daily. Centrum Silver for Women    . nitrofurantoin, macrocrystal-monohydrate, (MACROBID) 100 MG capsule Take 100 mg by mouth daily as needed (for UTI prevention).    . non-metallic deodorant Jethro Poling) MISC Apply 1 application topically daily as needed.    . Omega-3 Fatty Acids (FISH OIL) 1200 MG CAPS Take 1,200 mg by mouth daily.    . Probiotic Product (ALIGN) 4 MG CAPS Take 4 mg by mouth at bedtime.    . sodium chloride (OCEAN) 0.65 % nasal spray Place 1 spray into the nose 2 (two) times daily as needed for congestion.     . tamoxifen (NOLVADEX) 20 MG tablet Take 1 tablet (20 mg total) by mouth daily. 90 tablet 12  . triamcinolone (NASACORT AQ) 55 MCG/ACT AERO nasal inhaler Place 2 sprays into the nose daily as needed (for allergies.).    Marland Kitchen Vaginal Lubricant (REPLENS VA) Place 1 application vaginally every 3 (three) days.     No current facility-administered medications for this visit.     OBJECTIVE: Middle-aged white womanWho appears well  Vitals:   09/01/16 0947  BP: (!) 144/66  Pulse: 66  Resp: 18  Temp: 97.5 F (36.4 C)     Body mass index is 26.78 kg/m.    ECOG FS:0 - Asymptomatic  Sclerae unicteric, EOMs intact Oropharynx clear and moist No cervical or supraclavicular adenopathy Lungs no rales or rhonchi Heart regular rate and rhythm Abd soft, nontender, positive bowel sounds MSK no focal spinal tenderness, no upper extremity lymphedema Neuro: nonfocal, well oriented, appropriate affect Breasts: The right breast is currently receiving radiation. There is mild erythema but no desquamation. There is no suggestion of local recurrence. The right axilla is benign. The left breast is unremarkable   LAB  RESULTS:  CMP     Component Value Date/Time   NA 138 07/08/2016 1251   NA 138 06/16/2016 1236   K 4.2 07/08/2016 1251   K 4.4 06/16/2016 1236   CL 101 07/08/2016 1251   CO2 30 07/08/2016 1251   CO2 27 06/16/2016 1236   GLUCOSE 107 (H) 07/08/2016 1251  GLUCOSE 83 06/16/2016 1236   BUN 13 07/08/2016 1251   BUN 17.3 06/16/2016 1236   CREATININE 0.76 07/08/2016 1251   CREATININE 0.7 06/16/2016 1236   CALCIUM 9.4 07/08/2016 1251   CALCIUM 9.3 06/16/2016 1236   PROT 6.7 06/16/2016 1236   ALBUMIN 3.5 06/16/2016 1236   AST 22 06/16/2016 1236   ALT 18 06/16/2016 1236   ALKPHOS 74 06/16/2016 1236   BILITOT 0.66 06/16/2016 1236   GFRNONAA >60 07/08/2016 1251   GFRAA >60 07/08/2016 1251    INo results found for: SPEP, UPEP  Lab Results  Component Value Date   WBC 5.5 07/14/2016   NEUTROABS 4.6 06/16/2016   HGB 13.7 07/14/2016   HCT 40.1 07/14/2016   MCV 100.5 (H) 07/14/2016   PLT 338 07/14/2016      Chemistry      Component Value Date/Time   NA 138 07/08/2016 1251   NA 138 06/16/2016 1236   K 4.2 07/08/2016 1251   K 4.4 06/16/2016 1236   CL 101 07/08/2016 1251   CO2 30 07/08/2016 1251   CO2 27 06/16/2016 1236   BUN 13 07/08/2016 1251   BUN 17.3 06/16/2016 1236   CREATININE 0.76 07/08/2016 1251   CREATININE 0.7 06/16/2016 1236      Component Value Date/Time   CALCIUM 9.4 07/08/2016 1251   CALCIUM 9.3 06/16/2016 1236   ALKPHOS 74 06/16/2016 1236   AST 22 06/16/2016 1236   ALT 18 06/16/2016 1236   BILITOT 0.66 06/16/2016 1236       No results found for: LABCA2  No components found for: LABCA125  No results for input(s): INR in the last 168 hours.  Urinalysis    Component Value Date/Time   COLORURINE YELLOW 08/22/2013 1357   APPEARANCEUR CLEAR 08/22/2013 1357   LABSPEC 1.004 (L) 08/22/2013 1357   PHURINE 7.0 08/22/2013 1357   GLUCOSEU NEGATIVE 08/22/2013 1357   HGBUR TRACE (A) 08/22/2013 1357   BILIRUBINUR NEGATIVE 08/22/2013 1357   KETONESUR  NEGATIVE 08/22/2013 1357   PROTEINUR NEGATIVE 08/22/2013 1357   UROBILINOGEN 0.2 08/22/2013 1357   NITRITE NEGATIVE 08/22/2013 1357   LEUKOCYTESUR NEGATIVE 08/22/2013 1357     STUDIES: Right diagnostic mammography at [facility] 11/22/2015 showed probably benign vascular ossifications in the right breast. ELIGIBLE FOR AVAILABLE RESEARCH PROTOCOL: No  ASSESSMENT: 71 y.o. [place], [employer] woman status post right breast lower inner quadrant biopsy 06/03/2016 for ductal carcinoma in situ, high-grade, estrogen receptor positive, progesterone receptor negative  (1) right lumpectomy 07/14/2016 showed ductal carcinoma in situ, high-grade, measuring 0.8 cm, with negative margins  (2) adjuvant radiation in process  (3) to start tamoxifen mid March 2018  (a) bone density 06/03/2016 at [facility] shows osteoporosis with a T score of -2.5  (b) poorly has started at Dr. Trena Platt office 08/13/2016    PLAN: I spent approximately 30 minutes with Maria Jackson with most of that time spent discussing multiple areas of concern to her. She was not sure when her next mammogram but the one she had January 2018 simply suggested follow-up in 12 months. We have requested the one she had in November to clarify the right breast calcification question  She received denosumab/Prolia under Dr. Brigitte Pulse 08/13/2016. She tolerated this well. She had many questions regarding that. We specifically reviewed the dental issues and she has informed her dentist. There are no extractions or other major procedures planned.  She is benefiting from physical therapy and she plans to do a more vigorous program once she completes  the current treatments. She is walking around her home which is very scaly.  She would like to resume the Premarin intravaginally. This has been important for her for many years. I think it will be safe for her to do once she starts tamoxifen and she will be starting tamoxifen 2 weeks after completing her radiation.  She  thinks she would benefit from "dry needle" therapy and I was glad to write her a prescription for that.  She understands once she completes the radiation treatments she may use any deodorant and she wishes.  She is having some problems with insomnia mostly secondary to anxiety. We discussed the optimal sleep planning but most of my patients have trouble sleeping through the night even without being anxious simply because of menopause.  She was worried about prediabetes issues because of her blood glucose readings. We already discussed the optimal diet for her which will be fewer carbohydrates. If she couples that with increased exercise that up we will help with that problem.  She will see me again mid June. She will have been on tamoxifen about 3 months at that time. If she tolerates it well I will start seeing her on a once a year basis.  She knows to call for any other issues that may develop before her return.    Chauncey Cruel, MD   09/01/2016 10:36 AM Medical Oncology and Hematology [facility] [address] [place], [place] [address] Tel. [phone]    Fax. [phone]

## 2016-09-01 NOTE — Progress Notes (Signed)
Department of Radiation Oncology  Phone:  [phone] Fax:        [phone]  Weekly Treatment Note    Name: Maria Jackson Date: 09/01/2016 MRN: [id number] DOB: July 07, 1945   Diagnosis:     ICD-9-CM ICD-10-CM   1. Malignant neoplasm of lower-inner quadrant of right breast of female, estrogen receptor positive ([place]) 174.3 C50.311    V86.0 Z17.0      Current dose: 45 Gy  Current fraction: 18   MEDICATIONS: Current Outpatient Prescriptions  Medication Sig Dispense Refill  . aspirin 81 MG tablet Take 81 mg by mouth daily.    Marland Kitchen azithromycin (ZITHROMAX) 500 MG tablet Take 500 mg by mouth as directed. 500 mg by mouth 1 hour prior to dental work.    . cholecalciferol (VITAMIN D) 1000 units tablet Take 2,000 Units by mouth daily.    . cycloSPORINE (RESTASIS) 0.05 % ophthalmic emulsion Place 1 drop into both eyes 2 (two) times daily.    Marland Kitchen denosumab (PROLIA) 60 MG/ML SOLN injection Inject 60 mg into the skin every 6 (six) months. Administer in upper arm, thigh, or abdomen    . diphenhydrAMINE (BENADRYL) 25 mg capsule Take 25 mg by mouth every 6 (six) hours as needed (for congestion/allergies.).    Marland Kitchen hyaluronate sodium (RADIAPLEXRX) GEL Apply 1 application topically 2 (two) times daily.    Marland Kitchen loratadine (CLARITIN) 10 MG tablet Take 10 mg by mouth as needed for allergies.    . Lutein 6 MG CAPS Take 6 mg by mouth 2 (two) times daily.     . Magnesium 250 MG TABS Take 250 mg by mouth 2 (two) times daily.     . Multiple Vitamin (MULTIVITAMIN WITH MINERALS) TABS tablet Take 1 tablet by mouth daily. Centrum Silver for Women    . nitrofurantoin, macrocrystal-monohydrate, (MACROBID) 100 MG capsule Take 100 mg by mouth daily as needed (for UTI prevention).    . non-metallic deodorant Jethro Poling) MISC Apply 1 application topically daily as needed.    . Omega-3 Fatty Acids (FISH OIL) 1200 MG CAPS Take 1,200 mg by mouth daily.    . Probiotic Product (ALIGN) 4 MG CAPS Take 4 mg by mouth at bedtime.     . sodium chloride (OCEAN) 0.65 % nasal spray Place 1 spray into the nose 2 (two) times daily as needed for congestion.     . tamoxifen (NOLVADEX) 20 MG tablet Take 1 tablet (20 mg total) by mouth daily. 90 tablet 12  . triamcinolone (NASACORT AQ) 55 MCG/ACT AERO nasal inhaler Place 2 sprays into the nose daily as needed (for allergies.).    Marland Kitchen Vaginal Lubricant (REPLENS VA) Place 1 application vaginally every 3 (three) days.     No current facility-administered medications for this encounter.      ALLERGIES: Bee venom; Ibuprofen; Cephalexin; Penicillins; and Sulfonamide derivatives   LABORATORY DATA:  Lab Results  Component Value Date   WBC 5.5 07/14/2016   HGB 13.7 07/14/2016   HCT 40.1 07/14/2016   MCV 100.5 (H) 07/14/2016   PLT 338 07/14/2016   Lab Results  Component Value Date   NA 138 07/08/2016   K 4.2 07/08/2016   CL 101 07/08/2016   CO2 30 07/08/2016   Lab Results  Component Value Date   ALT 18 06/16/2016   AST 22 06/16/2016   ALKPHOS 74 06/16/2016   BILITOT 0.66 06/16/2016     NARRATIVE: OVI DOKE was seen today for weekly treatment management. The chart was checked and  the patient's films were reviewed.  The patient is doing very well. She is in her final week of treatment. No significant complaints although some mild skin irritation.  PHYSICAL EXAMINATION: vitals were not taken for this visit.     The patient's skin looks excellent. Mild hyperpigmentation/erythema in the treatment area with some dermatitis in the upper inner portion of the treatment area.  ASSESSMENT: The patient is doing satisfactorily with treatment. She will finish her final fraction on Friday.  PLAN: We will continue with the patient's radiation treatment as planned.  The patient will return to clinic in 1 month.

## 2016-09-02 ENCOUNTER — Ambulatory Visit
Admission: RE | Admit: 2016-09-02 | Discharge: 2016-09-02 | Disposition: A | Discharge status: Home / Self Care | Payer: Medicare Other | Source: Ambulatory Visit | Attending: Radiation Oncology | Admitting: Radiation Oncology

## 2016-09-02 ENCOUNTER — Ambulatory Visit: Payer: Medicare Other | Attending: General Surgery | Admitting: *Deleted

## 2016-09-02 DIAGNOSIS — M542 Cervicalgia: Secondary | ICD-10-CM

## 2016-09-02 DIAGNOSIS — Z510 Encounter for antineoplastic radiation therapy: Secondary | ICD-10-CM | POA: Diagnosis not present

## 2016-09-02 DIAGNOSIS — R293 Abnormal posture: Secondary | ICD-10-CM

## 2016-09-02 DIAGNOSIS — M25611 Stiffness of right shoulder, not elsewhere classified: Secondary | ICD-10-CM | POA: Diagnosis present

## 2016-09-02 DIAGNOSIS — M5412 Radiculopathy, cervical region: Secondary | ICD-10-CM

## 2016-09-02 NOTE — Therapy (Signed)
[facility] [place], [place], [address] Phone: [phone]   Fax:  [phone]  Physical Therapy Treatment  Patient Details  Name: Maria Jackson MRN: [id number] Date of Birth: 30-Dec-1945 Referring Provider: Dr. Autumn Messing  Encounter Date: 09/02/2016      PT End of Session - 09/02/16 0918    Visit Number 8   Number of Visits 12   Date for PT Re-Evaluation 09/21/16   PT Start Time 0903   PT Stop Time 0954   PT Time Calculation (min) 51 min      Past Medical History:  Diagnosis Date  . Allergy   . Arthritis    knees  . Breast cancer ([place]) 06/09/2016   right DCIS  . Cancer ([place]) 11/05/11   SQUAMOS CELL CARCINOMA - LEFT CALF  . Cataract   . Chronic lower back pain   . Concussion   . Diplopia    left eye - when she looks down  . Head injury, closed, with concussion   . Insomnia    NO LONGER AN ISSUE  . Malignant neoplasm of lower-inner quadrant of right female breast ([place]) 06/11/2016  . Osteopenia   . Raynaud disease   . Seasonal allergies   . Spondylolisthesis    lumbar    Past Surgical History:  Procedure Laterality Date  . APPENDECTOMY    . BREAST LUMPECTOMY WITH RADIOACTIVE SEED LOCALIZATION Right 07/14/2016   Procedure: RIGHT BREAST LUMPECTOMY WITH RADIOACTIVE SEED LOCALIZATION;  Surgeon: Autumn Messing III, MD;  Location: [facility];  Service: General;  Laterality: Right;  . CHOLECYSTECTOMY    . COLONOSCOPY    . ENDOSCOPIC VEIN LASER TREATMENT     calf riht and left  . FOOT NEUROMA SURGERY     bilateral  . JOINT REPLACEMENT    . TONSILLECTOMY    . TOTAL KNEE ARTHROPLASTY Left 04/27/2013   Procedure: LEFT TOTAL KNEE ARTHROPLASTY;  Surgeon: Gearlean Alf, MD;  [facility];  Service: Orthopedics;  Laterality: Left;  . TOTAL KNEE ARTHROPLASTY Right 08/27/2013   Procedure: RIGHT TOTAL KNEE ARTHROPLASTY;  Surgeon: Gearlean Alf, MD;  [facility];  Service: Orthopedics;  Laterality: Right;  . TUBAL  LIGATION    . VAGINAL HYSTERECTOMY     tah/bso  . WISDOM TOOTH EXTRACTION      There were no vitals filed for this visit.      Subjective Assessment - 09/02/16 0916    Subjective Did fairly well after last Rx. I did get approved for DN   Pertinent History Right lumpectomy for DCIS 07/14/16; radiation begins 08/09/16.   Patient Stated Goals Get arm back to normal   Currently in Pain? Yes   Pain Score 2    Pain Orientation Right   Pain Descriptors / Indicators Aching   Pain Type Surgical pain   Pain Onset More than a month ago                         [facility] Adult PT Treatment/Exercise - 09/02/16 0001      Shoulder Exercises: Pulleys   Flexion --  5 mins     Shoulder Exercises: ROM/Strengthening   UBE (Upper Arm Bike) x5 mins 90 RPMs     Modalities   Modalities Electrical Stimulation     Moist Heat Therapy   Number Minutes Moist Heat 15 Minutes   Moist Heat Location Cervical     Electrical Stimulation  Electrical Stimulation Location RT shldr/UT Premod x 15 mins, 5 sec on/off 80-150hz    Electrical Stimulation Goals Pain;Other (comment)     Manual Therapy   Manual Therapy Soft tissue mobilization;Myofascial release   Soft tissue mobilization To R UT and parascapular muscles   Myofascial Release TPR to R UT, Levator scapula, Infraspinatus, rhomboids                        Long Term Clinic Goals - 08/04/16 1520      CC Long Term Goal  #1   Title Increase cervical AROM to be WNL for improved mobility with daily tasks including driving.   Time 4   Period Weeks   Status New     CC Long Term Goal  #2   Title Report overall neck and arm pain decreased by >/= 50% to tolerate daily tasks with less discomfort.   Time 4   Period Weeks   Status New     CC Long Term Goal  #3   Title Increase right shoulder flexion and abduction to >/= 155 degreesf or increased ease reaching.   Time 4   Period Weeks   Status New     CC Long Term Goal   #4   Title Decrease Quick DASH score to </= 15 for improved functional use of right upper extremity.   Baseline 22.73   Time 4   Period Weeks   Status New          Patient will benefit from skilled therapeutic intervention in order to improve the following deficits and impairments:     Visit Diagnosis: Cervicalgia  Radiculopathy, cervical region  Abnormal posture     Problem List Patient Active Problem List   Diagnosis Date Noted  . Osteoporosis 06/20/2016  . Malignant neoplasm of lower-inner quadrant of right breast of female, estrogen receptor positive ([place]) 06/11/2016  . Cephalalgia 03/04/2016  . Diplopia 09/02/2015  . Neck pain on right side 09/02/2015  . Balance disorder 06/05/2015  . Neck pain 05/15/2015  . Headache 05/15/2015  . Postoperative anemia due to acute blood loss 08/28/2013  . OA (osteoarthritis) of knee 04/27/2013  . 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 Vaginal atrophy 05/25/2012  . CONSTIPATION 08/27/2009  . ULCERATION OF INTESTINE 08/27/2009    RAMSEUR,CHRIS, PTA 09/02/2016, 10:23 AM  [facility] [place], [place], [address] Phone: [phone]   Fax:  [phone]  Name: Maria Jackson MRN: [id number] Date of Birth: 1946/03/09

## 2016-09-03 ENCOUNTER — Ambulatory Visit
Admission: RE | Admit: 2016-09-03 | Discharge: 2016-09-03 | Disposition: A | Discharge status: Home / Self Care | Payer: Medicare Other | Source: Ambulatory Visit | Attending: Radiation Oncology | Admitting: Radiation Oncology

## 2016-09-03 ENCOUNTER — Encounter: Payer: Self-pay | Admitting: Radiation Oncology

## 2016-09-03 DIAGNOSIS — L720 Epidermal cyst: Secondary | ICD-10-CM | POA: Diagnosis not present

## 2016-09-03 DIAGNOSIS — Z510 Encounter for antineoplastic radiation therapy: Secondary | ICD-10-CM | POA: Diagnosis not present

## 2016-09-03 DIAGNOSIS — Z85828 Personal history of other malignant neoplasm of skin: Secondary | ICD-10-CM | POA: Diagnosis not present

## 2016-09-03 DIAGNOSIS — C50311 Malignant neoplasm of lower-inner quadrant of right female breast: Secondary | ICD-10-CM | POA: Diagnosis not present

## 2016-09-03 NOTE — Progress Notes (Signed)
Weekly rd txs right breast 20/20 completd,  Rash on chest/breat area, itches some, has radiaplex using bid,   Not interested in [place] in [place] month f/u appt given  With [facility] BP 134/69 (BP Location: Right Arm, Patient Position: Sitting, Cuff Size: Normal)   Pulse 63   Temp 98 F (36.7 C) (Oral)   Resp 20   Wt 151 lb 3.2 oz (68.6 kg)   BMI 26.78 kg/m   Wt Readings from Last 3 Encounters:  09/03/16 151 lb 3.2 oz (68.6 kg)  09/01/16 151 lb 3.2 oz (68.6 kg)  08/27/16 151 lb 3.2 oz (68.6 kg)

## 2016-09-06 ENCOUNTER — Telehealth: Payer: Self-pay | Admitting: *Deleted

## 2016-09-06 NOTE — Telephone Encounter (Signed)
  Oncology Nurse Navigator Documentation  Navigator Location: [facility] (09/06/16 1400)   )Navigator Encounter Type: Telephone (09/06/16 1400) Telephone: Outgoing Call (09/06/16 1400)  left vm to congratulate pt on completion of xrt. Informed next step is survivorship. Encourage pt to call with needs.                 Patient Visit Type: RadOnc (09/06/16 1400) Treatment Phase: Final Radiation Tx (09/06/16 1400)     Interventions: Referrals (09/06/16 1400) Referrals: Survivorship (09/06/16 1400)          Acuity: Level 1 (09/06/16 1400)         Time Spent with Patient: 15 (09/06/16 1400)

## 2016-09-07 ENCOUNTER — Ambulatory Visit: Payer: Medicare Other | Admitting: Physical Therapy

## 2016-09-07 DIAGNOSIS — M542 Cervicalgia: Secondary | ICD-10-CM | POA: Diagnosis not present

## 2016-09-07 NOTE — Therapy (Signed)
[facility] [place], [place], [address] Phone: [phone]   Fax:  [phone]  Physical Therapy Treatment  Patient Details  Name: Maria Jackson MRN: [id number] Date of Birth: Nov 27, 1945 Referring Provider: Dr. Autumn Messing  Encounter Date: 09/07/2016      PT End of Session - 09/07/16 1032    Visit Number 9   Number of Visits 12   Date for PT Re-Evaluation 09/21/16   PT Start Time 1031   PT Stop Time 1125   PT Time Calculation (min) 54 min   Activity Tolerance Patient tolerated treatment well   Behavior During Therapy [facility] for tasks assessed/performed      Past Medical History:  Diagnosis Date  . Allergy   . Arthritis    knees  . 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 Cancer ([place]) 11/05/11   SQUAMOS CELL CARCINOMA - LEFT CALF  . Cataract   . Chronic lower back pain   . Concussion   . Diplopia    left eye - when she looks down  . Head injury, closed, with concussion   . Insomnia    NO LONGER AN ISSUE  . Malignant neoplasm of lower-inner quadrant of right female breast ([place]) 06/11/2016  . Osteopenia   . Raynaud disease   . Seasonal allergies   . Spondylolisthesis    lumbar    Past Surgical History:  Procedure Laterality Date  . APPENDECTOMY    . BREAST LUMPECTOMY WITH RADIOACTIVE SEED LOCALIZATION Right 07/14/2016   Procedure: RIGHT BREAST LUMPECTOMY WITH RADIOACTIVE SEED LOCALIZATION;  Surgeon: Autumn Messing III, MD;  Location: [facility];  Service: General;  Laterality: Right;  . CHOLECYSTECTOMY    . COLONOSCOPY    . ENDOSCOPIC VEIN LASER TREATMENT     calf riht and left  . FOOT NEUROMA SURGERY     bilateral  . JOINT REPLACEMENT    . TONSILLECTOMY    . TOTAL KNEE ARTHROPLASTY Left 04/27/2013   Procedure: LEFT TOTAL KNEE ARTHROPLASTY;  Surgeon: Gearlean Alf, MD;  Location: [facility];  Service: Orthopedics;  Laterality: Left;  . TOTAL KNEE ARTHROPLASTY Right 08/27/2013   Procedure: RIGHT TOTAL KNEE  ARTHROPLASTY;  Surgeon: Gearlean Alf, MD;  Location: [facility];  Service: Orthopedics;  Laterality: Right;  . TUBAL LIGATION    . VAGINAL HYSTERECTOMY     tah/bso  . WISDOM TOOTH EXTRACTION      There were no vitals filed for this visit.      Subjective Assessment - 09/07/16 1033    Subjective Patient presents with two approvals for DN from her surgeon and her oncologist. She continues to have pain with cervical motion.   Pertinent History Right lumpectomy for DCIS 07/14/16; radiation begins 08/09/16.   Patient Stated Goals Get arm back to normal   Currently in Pain? Yes   Pain Score 2    Pain Location Shoulder   Pain Orientation Right   Pain Descriptors / Indicators Aching   Pain Radiating Towards R UT into shoulder                         [facility] Adult PT Treatment/Exercise - 09/07/16 0001      Modalities   Modalities Electrical Stimulation;Moist Heat     Moist Heat Therapy   Number Minutes Moist Heat 15 Minutes   Moist Heat Location Cervical     Electrical Stimulation   Electrical Stimulation Location B  UT and rhomboids   Electrical Stimulation Action IFC   Electrical Stimulation Parameters 80-150Hz  x 15 min   Electrical Stimulation Goals Pain     Manual Therapy   Manual Therapy Soft tissue mobilization;Myofascial release   Soft tissue mobilization To B UT, levator, cervical/thoracic paraspinals and R rhomboids   Myofascial Release to B lev scap          Trigger Point Dry Needling - 09/07/16 1116    Consent Given? Yes   Education Handout Provided Yes   Muscles Treated Upper Body Upper trapezius;Suboccipitals muscle group;Levator scapulae;Rhomboids  B UT, Lev scap; cerv multifidi; else R   Upper Trapezius Response Twitch reponse elicited;Palpable increased muscle length   SubOccipitals Response Twitch response elicited;Palpable increased muscle length   Levator Scapulae Response Twitch response elicited;Palpable increased muscle length   Rhomboids  Response Twitch response elicited;Palpable increased muscle length              PT Education - 09/07/16 1124    Education provided Yes   Education Details Reviewed DN aftercare and precautions   Person(s) Educated Patient   Methods Explanation;Handout   Comprehension Verbalized understanding                [facility] Goals - 08/04/16 1520      CC Long Term Goal  #1   Title Increase cervical AROM to be WNL for improved mobility with daily tasks including driving.   Time 4   Period Weeks   Status New     CC Long Term Goal  #2   Title Report overall neck and arm pain decreased by >/= 50% to tolerate daily tasks with less discomfort.   Time 4   Period Weeks   Status New     CC Long Term Goal  #3   Title Increase right shoulder flexion and abduction to >/= 155 degreesf or increased ease reaching.   Time 4   Period Weeks   Status New     CC Long Term Goal  #4   Title Decrease Quick DASH score to </= 15 for improved functional use of right upper extremity.   Baseline 22.73   Time 4   Period Weeks   Status New            Plan - 09/07/16 1124    Clinical Impression Statement Patient presented today for trial of DN. She provided RX for DN from both her surgeon and oncologist. She tolerated DN well with +++twitch response in L UT and good twitch responses in other muscles as well. She had no improvement in cervical rotation immediately after. She does demo TPs in R SCM and scalenes as well which may benefit from further DN.   PT Treatment/Interventions ADLs/Self Care Home Management;Patient/family education;Passive range of motion;Manual techniques;Electrical Stimulation;Therapeutic activities;Therapeutic exercise;Moist Heat;Cryotherapy;Traction;Dry needling   PT Next Visit Plan GCODE; Soft tissue work right upper trap, scalens, SCM and cervical paraspinals, PROM right shoulder, postural exercises, heat/stim  to bilateral upper traps.   DN if approved by  surgeon.   PT Home Exercise Plan cervical tension stretches      Patient will benefit from skilled therapeutic intervention in order to improve the following deficits and impairments:  Impaired UE functional use, Pain, Increased fascial restricitons, Postural dysfunction, Decreased range of motion  Visit Diagnosis: Cervicalgia     Problem List Patient Active Problem List   Diagnosis Date Noted  . Osteoporosis 06/20/2016  . Malignant neoplasm of lower-inner quadrant of  right breast of female, estrogen receptor positive ([place]) 06/11/2016  . Cephalalgia 03/04/2016  . Diplopia 09/02/2015  . Neck pain on right side 09/02/2015  . Balance disorder 06/05/2015  . Neck pain 05/15/2015  . Headache 05/15/2015  . Postoperative anemia due to acute blood loss 08/28/2013  . OA (osteoarthritis) of knee 04/27/2013  . 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 Vaginal atrophy 05/25/2012  . CONSTIPATION 08/27/2009  . ULCERATION OF INTESTINE 08/27/2009    Madelyn Flavors PT 09/07/2016, 11:36 AM  [facility] [address] [place], [place], [address] Phone: [phone]   Fax:  [phone]  Name: ZYRIANNA KOHN MRN: [id number] Date of Birth: May 24, 1946

## 2016-09-07 NOTE — Patient Instructions (Signed)
Trigger Point Dry Needling  . What is Trigger Point Dry Needling (DN)? o DN is a physical therapy technique used to treat muscle pain and dysfunction. Specifically, DN helps deactivate muscle trigger points (muscle knots).  o A thin filiform needle is used to penetrate the skin and stimulate the underlying trigger point. The goal is for a local twitch response (LTR) to occur and for the trigger point to relax. No medication of any kind is injected during the procedure.   . What Does Trigger Point Dry Needling Feel Like?  o The procedure feels different for each individual patient. Some patients report that they do not actually feel the needle enter the skin and overall the process is not painful. Very mild bleeding may occur. However, many patients feel a deep cramping in the muscle in which the needle was inserted. This is the local twitch response.   Marland Kitchen How Will I feel after the treatment? o Soreness is normal, and the onset of soreness may not occur for a few hours. Typically this soreness does not last longer than two days.  o Bruising is uncommon, however; ice can be used to decrease any possible bruising.  o In rare cases feeling tired or nauseous after the treatment is normal. In addition, your symptoms may get worse before they get better, this period will typically not last longer than 24 hours.   . What Can I do After My Treatment? o Increase your hydration by drinking more water for the next 24 hours. o You may place ice or heat on the areas treated that have become sore, however, do not use heat on inflamed or bruised areas. Heat often brings more relief post needling. o You can continue your regular activities, but vigorous activity is not recommended initially after the treatment for 24 hours. o DN is best combined with other physical therapy such as strengthening, stretching, and other therapies.    Precautions:  In some cases, dry needling is done over the lung field. While rare,  there is a risk of pneumothorax (punctured lung). Because of this, if you ever experience shortness of breath on exertion, difficulty taking a deep breath, chest pain or a dry cough following dry needling, you should report to an emergency room and tell them that you have been dry needled over the thorax.  Madelyn Flavors, PT 09/07/16 10:36 AM; [facility] [place], [place], [address] Phone: [phone]   Fax:  [phone]

## 2016-09-09 ENCOUNTER — Ambulatory Visit: Payer: Medicare Other | Admitting: *Deleted

## 2016-09-09 ENCOUNTER — Encounter: Payer: Medicare Other | Admitting: *Deleted

## 2016-09-09 DIAGNOSIS — M25611 Stiffness of right shoulder, not elsewhere classified: Secondary | ICD-10-CM

## 2016-09-09 DIAGNOSIS — M542 Cervicalgia: Secondary | ICD-10-CM

## 2016-09-09 DIAGNOSIS — R293 Abnormal posture: Secondary | ICD-10-CM

## 2016-09-09 DIAGNOSIS — M5412 Radiculopathy, cervical region: Secondary | ICD-10-CM

## 2016-09-09 NOTE — Therapy (Signed)
[facility] [place], [place], [address] Phone: [phone]   Fax:  [phone]  Physical Therapy Treatment  Patient Details  Name: Maria Jackson MRN: [id number] Date of Birth: 1945/08/02 Referring Provider: Dr. Autumn Messing  Encounter Date: 09/09/2016      PT End of Session - 09/09/16 1758    Visit Number 10   Number of Visits 14   Date for PT Re-Evaluation 09/21/16   PT Start Time 1430   PT Stop Time 1521   PT Time Calculation (min) 51 min      Past Medical History:  Diagnosis Date  . Allergy   . Arthritis    knees  . 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 Cancer ([place]) 11/05/11   SQUAMOS CELL CARCINOMA - LEFT CALF  . Cataract   . Chronic lower back pain   . Concussion   . Diplopia    left eye - when she looks down  . Head injury, closed, with concussion   . Insomnia    NO LONGER AN ISSUE  . 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 Osteopenia   . Raynaud disease   . Seasonal allergies   . Spondylolisthesis    lumbar    Past Surgical History:  Procedure Laterality Date  . APPENDECTOMY    . BREAST LUMPECTOMY WITH RADIOACTIVE SEED LOCALIZATION Right 07/14/2016   Procedure: RIGHT BREAST LUMPECTOMY WITH RADIOACTIVE SEED LOCALIZATION;  Surgeon: Autumn Messing III, MD;  Location: [employer] [facility];  Service: General;  Laterality: Right;  . CHOLECYSTECTOMY    . COLONOSCOPY    . ENDOSCOPIC VEIN LASER TREATMENT     calf riht and left  . FOOT NEUROMA SURGERY     bilateral  . JOINT REPLACEMENT    . TONSILLECTOMY    . TOTAL KNEE ARTHROPLASTY Left 04/27/2013   Procedure: LEFT TOTAL KNEE ARTHROPLASTY;  Surgeon: Gearlean Alf, MD;  [facility];  Service: Orthopedics;  Laterality: Left;  . TOTAL KNEE ARTHROPLASTY Right 08/27/2013   Procedure: RIGHT TOTAL KNEE ARTHROPLASTY;  Surgeon: Gearlean Alf, MD;  Location[facility];  Service: Orthopedics;  Laterality: Right;  . TUBAL  LIGATION    . VAGINAL HYSTERECTOMY     tah/bso  . WISDOM TOOTH EXTRACTION      There were no vitals filed for this visit.      Subjective Assessment - 09/09/16 1437    Subjective Patient presents with two approvals for DN from her surgeon and her oncologist. She continues to have pain with cervical motion.   Did well with DN   Pertinent History Right lumpectomy for DCIS 07/14/16; radiation begins 08/09/16.   Patient Stated Goals Get arm back to normal   Currently in Pain? Yes   Pain Score 2    Pain Location Shoulder   Pain Orientation Right   Pain Descriptors / Indicators Aching                         [facility] Adult PT Treatment/Exercise - 09/09/16 0001      Shoulder Exercises: Standing   Row Strengthening;Both;10 reps;20 reps;Theraband  yellow     Shoulder Exercises: Pulleys   Flexion --  5 mins     Shoulder Exercises: ROM/Strengthening   UBE (Upper Arm Bike) x5 mins 90 RPMs     Modalities   Modalities Electrical Stimulation;Moist Heat     Moist Heat Therapy  Number Minutes Moist Heat 15 Minutes   Moist Heat Location Cervical     Electrical Stimulation   Electrical Stimulation Location RT UT and LT rhomboids   Electrical Stimulation Goals Pain;Other (comment)     Manual Therapy   Manual Therapy Soft tissue mobilization;Myofascial release   Soft tissue mobilization To R UT, scalenes and Levator scap                        [facility] Goals - 08/04/16 1520      CC Long Term Goal  #1   Title Increase cervical AROM to be WNL for improved mobility with daily tasks including driving.   Time 4   Period Weeks   Status New     CC Long Term Goal  #2   Title Report overall neck and arm pain decreased by >/= 50% to tolerate daily tasks with less discomfort.   Time 4   Period Weeks   Status New     CC Long Term Goal  #3   Title Increase right shoulder flexion and abduction to >/= 155 degreesf or increased ease reaching.   Time 4    Period Weeks   Status New     CC Long Term Goal  #4   Title Decrease Quick DASH score to </= 15 for improved functional use of right upper extremity.   Baseline 22.73   Time 4   Period Weeks   Status New            Plan - 09/09/16 1759    Clinical Impression Statement Pt did great after DN last Rx  and had less tension and decreased TPs in RT UT. She had TPs in levator scap and into scalenes still. She did well with TP release and active release technique. She had decreased pain after Rx, but ROM was still  the same.   Rehab Potential Excellent   Clinical Impairments Affecting Rehab Potential Recent breast cancer and will be undergoing daily radiation   PT Frequency 2x / week   PT Duration 3 weeks   PT Treatment/Interventions ADLs/Self Care Home Management;Patient/family education;Passive range of motion;Manual techniques;Electrical Stimulation;Therapeutic activities;Therapeutic exercise;Moist Heat;Cryotherapy;Traction;Dry needling   PT Next Visit Plan Soft tissue work right upper trap, scalens, SCM and cervical paraspinals, PROM right shoulder, postural exercises, heat/stim  to bilateral upper traps.   DN if approved by surgeon.   PT Home Exercise Plan cervical tension stretches   Consulted and Agree with Plan of Care Patient      Patient will benefit from skilled therapeutic intervention in order to improve the following deficits and impairments:  Impaired UE functional use, Pain, Increased fascial restricitons, Postural dysfunction, Decreased range of motion  Visit Diagnosis: Cervicalgia  Radiculopathy, cervical region  Abnormal posture  Stiffness of right shoulder, not elsewhere classified     Problem List Patient Active Problem List   Diagnosis Date Noted  . Osteoporosis 06/20/2016  . Malignant neoplasm of lower-inner quadrant of right breast of female, estrogen receptor positive ([name]) 06/11/2016  . Cephalalgia 03/04/2016  . Diplopia 09/02/2015  . Neck pain  on right side 09/02/2015  . Balance disorder 06/05/2015  . Neck pain 05/15/2015  . Headache 05/15/2015  . Postoperative anemia due to acute blood loss 08/28/2013  . OA (osteoarthritis) of knee 04/27/2013  . 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 Vaginal atrophy 05/25/2012  . CONSTIPATION 08/27/2009  . ULCERATION OF INTESTINE 08/27/2009  Daylani Deblois,CHRIS, PTA 09/09/2016, 6:08 PM  [facility] [place], [place], [address] Phone: [phone]   Fax:  [phone]  Name: CORVETTE ORSER MRN: [id number] Date of Birth: 02-Jul-1946

## 2016-09-10 ENCOUNTER — Encounter: Payer: Medicare Other | Admitting: *Deleted

## 2016-09-13 ENCOUNTER — Encounter: Payer: Self-pay | Admitting: Radiation Oncology

## 2016-09-13 ENCOUNTER — Encounter: Payer: Medicare Other | Admitting: Physical Therapy

## 2016-09-13 NOTE — Progress Notes (Signed)
  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    Indication for treatment:  Curative       Radiation treatment dates:   08/09/16-09/03/16  Site/dose:   1) Right breast/ 42.5 Gy in 17 fractions   2) Right breast boost/ 7.5 Gy in 3 fractions  Beams/energy:   1) 3D / 6X    2) En face/ 12 MeV  Narrative: The patient tolerated radiation treatment relatively well. During treatment, the patient had no complaints and exhibited mild skin irritation.  Plan: The patient has completed radiation treatment. The patient will return to radiation oncology clinic for routine followup in one month. I advised them to call or return sooner if they have any questions or concerns related to their recovery or treatment.  ------------------------------------------------  [name], MD, PhD  This document serves as a record of services personally performed by Kyung Rudd, MD. It was created on his behalf by Bethann Humble, a trained medical scribe. The creation of this record is based on the scribe's personal observations and the provider's statements to them. This document has been checked and approved by the attending provider.

## 2016-09-14 ENCOUNTER — Ambulatory Visit: Payer: Medicare Other | Admitting: Physical Therapy

## 2016-09-14 DIAGNOSIS — M542 Cervicalgia: Secondary | ICD-10-CM

## 2016-09-14 NOTE — Therapy (Signed)
[facility] [place], [place], [address] Phone: [phone]   Fax:  [phone]  Physical Therapy Treatment  Patient Details  Name: Maria Jackson MRN: [id number] Date of Birth: Nov 24, 1945 Referring Provider: Dr. Autumn Messing  Encounter Date: 09/14/2016      PT End of Session - 09/14/16 1036    Visit Number 11   Number of Visits 14   Date for PT Re-Evaluation 09/21/16   PT Start Time 5852   PT Stop Time 1132   PT Time Calculation (min) 57 min   Activity Tolerance Patient tolerated treatment well   Behavior During Therapy [facility] for tasks assessed/performed      Past Medical History:  Diagnosis Date  . Allergy   . Arthritis    knees  . 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 Cancer ([place]) 11/05/11   SQUAMOS CELL CARCINOMA - LEFT CALF  . Cataract   . Chronic lower back pain   . Concussion   . Diplopia    left eye - when she looks down  . Head injury, closed, with concussion   . Insomnia    NO LONGER AN ISSUE  . Malignant neoplasm of lower-inner quadrant of right female breast ([place]) 06/11/2016  . Osteopenia   . Raynaud disease   . Seasonal allergies   . Spondylolisthesis    lumbar    Past Surgical History:  Procedure Laterality Date  . APPENDECTOMY    . BREAST LUMPECTOMY WITH RADIOACTIVE SEED LOCALIZATION Right 07/14/2016   Procedure: RIGHT BREAST LUMPECTOMY WITH RADIOACTIVE SEED LOCALIZATION;  Surgeon: Autumn Messing III, MD;  Location: [place];  Service: General;  Laterality: Right;  . CHOLECYSTECTOMY    . COLONOSCOPY    . ENDOSCOPIC VEIN LASER TREATMENT     calf riht and left  . FOOT NEUROMA SURGERY     bilateral  . JOINT REPLACEMENT    . TONSILLECTOMY    . TOTAL KNEE ARTHROPLASTY Left 04/27/2013   Procedure: LEFT TOTAL KNEE ARTHROPLASTY;  Surgeon: Gearlean Alf, MD;  [facility];  Service: Orthopedics;  Laterality: Left;  . TOTAL KNEE ARTHROPLASTY Right 08/27/2013   Procedure: RIGHT TOTAL KNEE  ARTHROPLASTY;  Surgeon: Gearlean Alf, MD;  Location[facility];  Service: Orthopedics;  Laterality: Right;  . TUBAL LIGATION    . VAGINAL HYSTERECTOMY     tah/bso  . WISDOM TOOTH EXTRACTION      There were no vitals filed for this visit.      Subjective Assessment - 09/14/16 1036    Subjective Patient travelled this weekend and carried 23 week old baby all weekend. Her neck is really flared up now. It was worse yesterday.    Pertinent History Right lumpectomy for DCIS 07/14/16; radiation begins 08/09/16.   Patient Stated Goals Get arm back to normal   Currently in Pain? Yes   Pain Score 3    Pain Location Neck   Pain Orientation Right   Pain Descriptors / Indicators Aching   Pain Type Surgical pain   Pain Radiating Towards R UT into shoulder   Pain Onset More than a month ago   Pain Frequency Intermittent   Aggravating Factors  carrying   Pain Relieving Factors unknown                  Katina Dung - 09/14/16 0001    Open a tight or new jar No difficulty   Do heavy household chores (wash walls,  wash floors) No difficulty   Carry a shopping bag or briefcase No difficulty   Wash your back No difficulty   Use a knife to cut food No difficulty   Recreational activities in which you take some force or impact through your arm, shoulder, or hand (golf, hammering, tennis) No difficulty   During the past week, to what extent has your arm, shoulder or hand problem interfered with your normal social activities with family, friends, neighbors, or groups? Modererately   During the past week, to what extent has your arm, shoulder or hand problem limited your work or other regular daily activities Slightly   Arm, shoulder, or hand pain. Moderate   Tingling (pins and needles) in your arm, shoulder, or hand Mild   Difficulty Sleeping Moderate difficulty   DASH Score 18.18 %               [facility] Adult PT Treatment/Exercise - 09/14/16 0001      Self-Care   Self-Care Other  Self-Care Comments  discussed use of towel roll in her pillow to support neck     Shoulder Exercises: Seated   Other Seated Exercises scapular retraction x 10     Modalities   Modalities Electrical Stimulation;Moist Heat     Moist Heat Therapy   Number Minutes Moist Heat 15 Minutes   Moist Heat Location Cervical     Electrical Stimulation   Electrical Stimulation Location B UT and rhomboids   Electrical Stimulation Action IFC   Electrical Stimulation Parameters 80-150Hz  x 15 min to tolerance   Electrical Stimulation Goals Pain     Manual Therapy   Manual Therapy Soft tissue mobilization;Myofascial release   Soft tissue mobilization to B UT, leva scapula, rhomboids and C/T paraspinals   Myofascial Release to B lev Scap          Trigger Point Dry Needling - 09/14/16 1125    Consent Given? Yes   Education Handout Provided No   Muscles Treated Upper Body Upper trapezius;Oblique capitus;Suboccipitals muscle group   Upper Trapezius Response Twitch reponse elicited;Palpable increased muscle length   Oblique Capitus Response Twitch response elicited;Palpable increased muscle length  R   SubOccipitals Response Twitch response elicited;Palpable increased muscle length  R   Levator Scapulae Response Twitch response elicited;Palpable increased muscle length              PT Education - 09/14/16 1129    Education provided Yes   Education Details see self care; also discussed correct body mechanics for carrying  with ADLS   Person(s) Educated Patient   Methods Explanation;Demonstration   Comprehension Verbalized understanding;Returned demonstration                Long Term Clinic Goals - 08/04/16 1520      CC Long Term Goal  #1   Title Increase cervical AROM to be WNL for improved mobility with daily tasks including driving.   Time 4   Period Weeks   Status New     CC Long Term Goal  #2   Title Report overall neck and arm pain decreased by >/= 50% to tolerate  daily tasks with less discomfort.   Time 4   Period Weeks   Status New     CC Long Term Goal  #3   Title Increase right shoulder flexion and abduction to >/= 155 degreesf or increased ease reaching.   Time 4   Period Weeks   Status New     CC  Long Term Goal  #4   Title Decrease Quick DASH score to </= 15 for improved functional use of right upper extremity.   Baseline 22.73; 18.18 on 09/14/16   Time 4   Period Weeks   Status New            Plan - 09/14/16 1130    Clinical Impression Statement Patient reported increased pain over the weekend secondary to visiting her new grandbaby and holding/carrying her all weekend. She was compliant with stretches and pain improved from yesterday, but is still there. She has improved her quick dash score from 22.73 to 18.18, but is still shy of her goal of 15.  She responded well to DN B and normal response to modalities.   PT Treatment/Interventions ADLs/Self Care Home Management;Patient/family education;Passive range of motion;Manual techniques;Electrical Stimulation;Therapeutic activities;Therapeutic exercise;Moist Heat;Cryotherapy;Traction;Dry needling   PT Next Visit Plan Progress scapular stab and add mid/low trap strengthening as tolerated.Continue manual PRN.  DN as indicated.   PT Home Exercise Plan cervical tension stretches      Patient will benefit from skilled therapeutic intervention in order to improve the following deficits and impairments:  Impaired UE functional use, Pain, Increased fascial restricitons, Postural dysfunction, Decreased range of motion  Visit Diagnosis: Cervicalgia     Problem List Patient Active Problem List   Diagnosis Date Noted  . Osteoporosis 06/20/2016  . Malignant neoplasm of lower-inner quadrant of right breast of female, estrogen receptor positive ([place]) 06/11/2016  . Cephalalgia 03/04/2016  . Diplopia 09/02/2015  . Neck pain on right side 09/02/2015  . Balance disorder 06/05/2015  . Neck  pain 05/15/2015  . Headache 05/15/2015  . Postoperative anemia due to acute blood loss 08/28/2013  . OA (osteoarthritis) of knee 04/27/2013  . 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 Vaginal atrophy 05/25/2012  . CONSTIPATION 08/27/2009  . ULCERATION OF INTESTINE 08/27/2009    Madelyn Flavors PT 09/14/2016, 11:44 AM  [facility] [address] [place], [place], [address] Phone: [phone]   Fax:  [phone]  Name: Maria Jackson MRN: [id number] Date of Birth: [birth year]

## 2016-09-16 ENCOUNTER — Encounter: Payer: Medicare Other | Admitting: *Deleted

## 2016-09-16 DIAGNOSIS — Z96652 Presence of left artificial knee joint: Secondary | ICD-10-CM | POA: Diagnosis not present

## 2016-09-16 DIAGNOSIS — Z96653 Presence of artificial knee joint, bilateral: Secondary | ICD-10-CM | POA: Diagnosis not present

## 2016-09-16 DIAGNOSIS — Z471 Aftercare following joint replacement surgery: Secondary | ICD-10-CM | POA: Diagnosis not present

## 2016-09-16 DIAGNOSIS — Z96651 Presence of right artificial knee joint: Secondary | ICD-10-CM | POA: Diagnosis not present

## 2016-09-17 ENCOUNTER — Ambulatory Visit: Payer: Medicare Other | Admitting: *Deleted

## 2016-09-17 DIAGNOSIS — M25611 Stiffness of right shoulder, not elsewhere classified: Secondary | ICD-10-CM

## 2016-09-17 DIAGNOSIS — R293 Abnormal posture: Secondary | ICD-10-CM

## 2016-09-17 DIAGNOSIS — M542 Cervicalgia: Secondary | ICD-10-CM

## 2016-09-17 DIAGNOSIS — M5412 Radiculopathy, cervical region: Secondary | ICD-10-CM

## 2016-09-17 NOTE — Therapy (Signed)
[facility] [place], [place], [address] Phone: [phone]   Fax:  [phone]  Physical Therapy Treatment  Patient Details  Name: Maria Jackson MRN: [id number] Date of Birth: June 09, 1946 Referring Provider: Dr. Autumn Messing  Encounter Date: 09/17/2016      PT End of Session - 09/17/16 1159    Visit Number 12   Number of Visits 14   Date for PT Re-Evaluation 09/21/16   PT Start Time 1030   PT Stop Time 1129   PT Time Calculation (min) 59 min      Past Medical History:  Diagnosis Date  . Allergy   . Arthritis    knees  . 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 Cancer ([place]) 11/05/11   SQUAMOS CELL CARCINOMA - LEFT CALF  . Cataract   . Chronic lower back pain   . Concussion   . Diplopia    left eye - when she looks down  . Head injury, closed, with concussion   . Insomnia    NO LONGER AN ISSUE  . Malignant neoplasm of lower-inner quadrant of right female breast ([place]) 06/11/2016  . Osteopenia   . Raynaud disease   . Seasonal allergies   . Spondylolisthesis    lumbar    Past Surgical History:  Procedure Laterality Date  . APPENDECTOMY    . BREAST LUMPECTOMY WITH RADIOACTIVE SEED LOCALIZATION Right 07/14/2016   Procedure: RIGHT BREAST LUMPECTOMY WITH RADIOACTIVE SEED LOCALIZATION;  Surgeon: Autumn Messing III, MD;  Location: [place];  Service: General;  Laterality: Right;  . CHOLECYSTECTOMY    . COLONOSCOPY    . ENDOSCOPIC VEIN LASER TREATMENT     calf riht and left  . FOOT NEUROMA SURGERY     bilateral  . JOINT REPLACEMENT    . TONSILLECTOMY    . TOTAL KNEE ARTHROPLASTY Left 04/27/2013   Procedure: LEFT TOTAL KNEE ARTHROPLASTY;  Surgeon: Gearlean Alf, MD;  [facility];  Service: Orthopedics;  Laterality: Left;  . TOTAL KNEE ARTHROPLASTY Right 08/27/2013   Procedure: RIGHT TOTAL KNEE ARTHROPLASTY;  Surgeon: Gearlean Alf, MD;  [facility];  Service: Orthopedics;  Laterality: Right;  . TUBAL  LIGATION    . VAGINAL HYSTERECTOMY     tah/bso  . WISDOM TOOTH EXTRACTION      There were no vitals filed for this visit.      Subjective Assessment - 09/17/16 1036    Subjective Patient travelled this weekend and carried 25 week old baby all weekend. Her neck is really flared up now.  Did ok after last Rx   Pertinent History Right lumpectomy for DCIS 07/14/16; radiation begins 08/09/16.   Patient Stated Goals Get arm back to normal   Currently in Pain? Yes   Pain Score 3    Pain Location Neck   Pain Orientation Right   Pain Descriptors / Indicators Aching   Pain Type Surgical pain   Pain Onset More than a month ago   Pain Frequency Intermittent                         [facility] Adult PT Treatment/Exercise - 09/17/16 0001      Shoulder Exercises: Standing   Row Strengthening;Both;10 reps;20 reps;Theraband  yellow  4x10     Shoulder Exercises: Pulleys   Flexion --  5 mins     Shoulder Exercises: ROM/Strengthening   UBE (Upper Arm Bike) x5 mins  90 RPMs     Modalities   Modalities Electrical Stimulation;Moist Heat     Moist Heat Therapy   Number Minutes Moist Heat 15 Minutes   Moist Heat Location Cervical     Electrical Stimulation   Electrical Stimulation Location RT UT and LT rhomboids  IFC x15 mins 80-150hz    Electrical Stimulation Goals Pain     Manual Therapy   Manual Therapy Soft tissue mobilization;Myofascial release   Soft tissue mobilization STW to RT side cerv paras supine   Passive ROM cervical PROM and light manual traction, STW to LT and RT paras supinf. PROM to RT shldr for flexion                        Long Term Clinic Goals - 08/04/16 1520      CC Long Term Goal  #1   Title Increase cervical AROM to be WNL for improved mobility with daily tasks including driving.   Time 4   Period Weeks   Status New     CC Long Term Goal  #2   Title Report overall neck and arm pain decreased by >/= 50% to tolerate daily tasks with  less discomfort.   Time 4   Period Weeks   Status New     CC Long Term Goal  #3   Title Increase right shoulder flexion and abduction to >/= 155 degreesf or increased ease reaching.   Time 4   Period Weeks   Status New     CC Long Term Goal  #4   Title Decrease Quick DASH score to </= 15 for improved functional use of right upper extremity.   Baseline 22.73   Time 4   Period Weeks   Status New            Plan - 09/17/16 1128    Clinical Impression Statement Pt arrived to clinic doing a little better after last Rx with decreased pain in RT side of neck and shldr. She was able to perform ROM and posture exs with minimal increase in pain. Her cervical rotation was 55 degrees LT and 60 degrees to the RT. Normal response to modalities.   Rehab Potential Excellent   Clinical Impairments Affecting Rehab Potential Recent breast cancer and will be undergoing daily radiation   PT Frequency 2x / week   PT Duration 3 weeks   PT Treatment/Interventions ADLs/Self Care Home Management;Patient/family education;Passive range of motion;Manual techniques;Electrical Stimulation;Therapeutic activities;Therapeutic exercise;Moist Heat;Cryotherapy;Traction;Dry needling   PT Next Visit Plan Progress scapular stab and add mid/low trap strengthening as tolerated.Continue manual PRN.  DN as indicated.   PT Home Exercise Plan cervical tension stretches   Consulted and Agree with Plan of Care Patient      Patient will benefit from skilled therapeutic intervention in order to improve the following deficits and impairments:  Impaired UE functional use, Pain, Increased fascial restricitons, Postural dysfunction, Decreased range of motion  Visit Diagnosis: Cervicalgia  Radiculopathy, cervical region  Abnormal posture  Stiffness of right shoulder, not elsewhere classified     Problem List Patient Active Problem List   Diagnosis Date Noted  . Osteoporosis 06/20/2016  . Malignant neoplasm of  lower-inner quadrant of right breast of female, estrogen receptor positive ([place]) 06/11/2016  . Cephalalgia 03/04/2016  . Diplopia 09/02/2015  . Neck pain on right side 09/02/2015  . Balance disorder 06/05/2015  . Neck pain 05/15/2015  . Headache 05/15/2015  . Postoperative anemia due to  acute blood loss 08/28/2013  . OA (osteoarthritis) of knee 04/27/2013  . 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 Vaginal atrophy 05/25/2012  . CONSTIPATION 08/27/2009  . ULCERATION OF INTESTINE 08/27/2009    Hosea Hanawalt,CHRIS, PTA 09/17/2016, 12:00 PM  [facility] [place], [place], [address] Phone: [phone]   Fax:  [phone]  Name: Maria Jackson MRN: [id number] Date of Birth: January 06, 1946

## 2016-09-21 ENCOUNTER — Ambulatory Visit: Payer: Medicare Other | Admitting: Physical Therapy

## 2016-09-21 DIAGNOSIS — M542 Cervicalgia: Secondary | ICD-10-CM

## 2016-09-21 NOTE — Patient Instructions (Signed)
Abduction: Horizontal - Prone ([place]umbbell)   Lie with right arm hanging down. Lift arm out to side, thumb up.  Repeat _10___ times per set. Do __1-3__ sets per session. Do __1__ sessions per day. Use ____ lb weight.  Repeat with palm facing down toward floor.  Straight Arm Lift: Prone   Arm straight out diagonally. Keeping thumb up, lift arm. Keep pelvis still. You can add light weight. Do _10-30__ times, 1_ times per day.  Also perform with palm facing the floor.   Extension - Prone (Dumbbell)    Lie with right arm hanging off side of bed. Lift hand back and up. Repeat __10__ times per set. Do __1-3__ sets per session. Do _1___ sessions per day. Use ____ lb weight.   Copyright  VHI. All rights reserved.   Madelyn Flavors, PT 09/21/16 10:48 AM [facility] [address] [place], [place], [address] Phone: [phone]   Fax:  [phone]

## 2016-09-21 NOTE — Therapy (Signed)
[facility] [place], [place], [address] Phone: [phone]   Fax:  [phone]  Physical Therapy Treatment  Patient Details  Name: Maria Jackson MRN: [id number] Date of Birth: Nov 16, 1945 Referring Provider: Dr. Autumn Messing  Encounter Date: 09/21/2016      PT End of Session - 09/21/16 1036    Visit Number 13   Number of Visits 14   Date for PT Re-Evaluation 09/21/16   PT Start Time 2831   PT Stop Time 1130   PT Time Calculation (min) 55 min   Activity Tolerance Patient tolerated treatment well   Behavior During Therapy [facility] for tasks assessed/performed      Past Medical History:  Diagnosis Date  . Allergy   . Arthritis    knees  . 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 Cancer ([place]) 11/05/11   SQUAMOS CELL CARCINOMA - LEFT CALF  . Cataract   . Chronic lower back pain   . Concussion   . Diplopia    left eye - when she looks down  . Head injury, closed, with concussion   . Insomnia    NO LONGER AN ISSUE  . Malignant neoplasm of lower-inner quadrant of right female breast ([place]) 06/11/2016  . Osteopenia   . Raynaud disease   . Seasonal allergies   . Spondylolisthesis    lumbar    Past Surgical History:  Procedure Laterality Date  . APPENDECTOMY    . BREAST LUMPECTOMY WITH RADIOACTIVE SEED LOCALIZATION Right 07/14/2016   Procedure: RIGHT BREAST LUMPECTOMY WITH RADIOACTIVE SEED LOCALIZATION;  Surgeon: Autumn Messing III, MD;  Location: [place];  Service: General;  Laterality: Right;  . CHOLECYSTECTOMY    . COLONOSCOPY    . ENDOSCOPIC VEIN LASER TREATMENT     calf riht and left  . FOOT NEUROMA SURGERY     bilateral  . JOINT REPLACEMENT    . TONSILLECTOMY    . TOTAL KNEE ARTHROPLASTY Left 04/27/2013   Procedure: LEFT TOTAL KNEE ARTHROPLASTY;  Surgeon: Gearlean Alf, MD;  [facility];  Service: Orthopedics;  Laterality: Left;  . TOTAL KNEE ARTHROPLASTY Right 08/27/2013   Procedure: RIGHT TOTAL KNEE  ARTHROPLASTY;  Surgeon: Gearlean Alf, MD;  [facility];  Service: Orthopedics;  Laterality: Right;  . TUBAL LIGATION    . VAGINAL HYSTERECTOMY     tah/bso  . WISDOM TOOTH EXTRACTION      There were no vitals filed for this visit.      Subjective Assessment - 09/21/16 1037    Subjective Patient reports increased low back pain.   Pertinent History Right lumpectomy for DCIS 07/14/16; radiation begins 08/09/16.   Patient Stated Goals Get arm back to normal   Currently in Pain? Yes   Pain Score 2    Pain Location Neck   Pain Orientation Right   Pain Descriptors / Indicators Aching   Pain Type Surgical pain   Pain Onset More than a month ago   Pain Frequency Intermittent   Aggravating Factors  carrying   Pain Relieving Factors unknown                         [facility] Adult PT Treatment/Exercise - 09/21/16 0001      Shoulder Exercises: Prone   Extension Strengthening;Right;20 reps   Horizontal ABduction 1 Strengthening;Right;20 reps   Horizontal ABduction 2 Strengthening;Right;20 reps     Modalities   Modalities Electrical Stimulation;Moist  Heat     Moist Heat Therapy   Number Minutes Moist Heat 15 Minutes   Moist Heat Location Cervical     Electrical Stimulation   Electrical Stimulation Location R UT and rhomboids   Electrical Stimulation Action IFC   Electrical Stimulation Parameters 80-150 Hz x 15 min    Electrical Stimulation Goals Pain     Manual Therapy   Manual Therapy Soft tissue mobilization;Myofascial release   Soft tissue mobilization to R UT, levator and rhomboids; also lats   Myofascial Release to R rhomboids and lev scap          Trigger Point Dry Needling - 09/21/16 1222    Consent Given? Yes   Education Handout Provided No   Muscles Treated Upper Body Upper trapezius;Levator scapulae;Infraspinatus  R; and lats   Upper Trapezius Response Twitch reponse elicited;Palpable increased muscle length   Levator Scapulae Response Twitch  response elicited;Palpable increased muscle length   Rhomboids Response Twitch response elicited;Palpable increased muscle length   Infraspinatus Response Twitch response elicited;Palpable increased muscle length  also lats              PT Education - 09/21/16 1221    Education provided Yes   Education Details HEP   Person(s) Educated Patient   Methods Explanation;Demonstration   Comprehension Verbalized understanding;Returned demonstration                Long Term Clinic Goals - 08/04/16 1520      CC Long Term Goal  #1   Title Increase cervical AROM to be WNL for improved mobility with daily tasks including driving.   Time 4   Period Weeks   Status New     CC Long Term Goal  #2   Title Report overall neck and arm pain decreased by >/= 50% to tolerate daily tasks with less discomfort.   Time 4   Period Weeks   Status New     CC Long Term Goal  #3   Title Increase right shoulder flexion and abduction to >/= 155 degreesf or increased ease reaching.   Time 4   Period Weeks   Status New     CC Long Term Goal  #4   Title Decrease Quick DASH score to </= 15 for improved functional use of right upper extremity.   Baseline 22.73   Time 4   Period Weeks   Status New            Plan - 09/21/16 1224    Clinical Impression Statement Patient presented today with c/o increased LBP for which she has an appointment with MD. She had some increased tension in her R UT/lev scap and rhomboids today which responded well to DN and manual therapy. She did well with mid and low trap TE on R with no c/o pain.    PT Treatment/Interventions ADLs/Self Care Home Management;Patient/family education;Passive range of motion;Manual techniques;Electrical Stimulation;Therapeutic activities;Therapeutic exercise;Moist Heat;Cryotherapy;Traction;Dry needling   PT Next Visit Plan Quick Dash; Finalize HEP and D/C; DN prn.   PT Home Exercise Plan prone T,Y, and shoulder ext; cervical tension  stretches      Patient will benefit from skilled therapeutic intervention in order to improve the following deficits and impairments:  Impaired UE functional use, Pain, Increased fascial restricitons, Postural dysfunction, Decreased range of motion  Visit Diagnosis: Cervicalgia     Problem List Patient Active Problem List   Diagnosis Date Noted  . Osteoporosis 06/20/2016  . Malignant neoplasm of lower-inner  quadrant of right breast of female, estrogen receptor positive ([place]) 06/11/2016  . Cephalalgia 03/04/2016  . Diplopia 09/02/2015  . Neck pain on right side 09/02/2015  . Balance disorder 06/05/2015  . Neck pain 05/15/2015  . Headache 05/15/2015  . Postoperative anemia due to acute blood loss 08/28/2013  . OA (osteoarthritis) of knee 04/27/2013  . 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 Vaginal atrophy 05/25/2012  . CONSTIPATION 08/27/2009  . ULCERATION OF INTESTINE 08/27/2009    Madelyn Flavors PT 09/21/2016, 12:31 PM  [facility] [address] [place], [place], [address] Phone: [phone]   Fax:  [phone]  Name: Maria Jackson MRN: [id number] Date of Birth: 1946-06-18

## 2016-09-22 DIAGNOSIS — M5136 Other intervertebral disc degeneration, lumbar region: Secondary | ICD-10-CM | POA: Diagnosis not present

## 2016-09-24 ENCOUNTER — Encounter: Payer: Medicare Other | Admitting: Physical Therapy

## 2016-10-04 ENCOUNTER — Ambulatory Visit: Payer: Medicare Other | Attending: General Surgery | Admitting: Physical Therapy

## 2016-10-04 DIAGNOSIS — M542 Cervicalgia: Secondary | ICD-10-CM | POA: Insufficient documentation

## 2016-10-04 DIAGNOSIS — M5412 Radiculopathy, cervical region: Secondary | ICD-10-CM | POA: Diagnosis not present

## 2016-10-04 NOTE — Therapy (Signed)
[facility] [place], [place], [address] Phone: [phone]   Fax:  [phone]  Physical Therapy Treatment  Patient Details  Name: Maria Jackson MRN: [id number] Date of Birth: February 05, 1946 Referring Provider: Dr. Autumn Messing  Encounter Date: 10/04/2016      PT End of Session - 10/04/16 1116    Visit Number 14   Number of Visits 14   Date for PT Re-Evaluation 09/21/16   PT Start Time 1115   PT Stop Time 1214   PT Time Calculation (min) 59 min   Activity Tolerance Patient tolerated treatment well   Behavior During Therapy [facility] for tasks assessed/performed      Past Medical History:  Diagnosis Date  . Allergy   . Arthritis    knees  . 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 Cancer ([place]) 11/05/11   SQUAMOS CELL CARCINOMA - LEFT CALF  . Cataract   . Chronic lower back pain   . Concussion   . Diplopia    left eye - when she looks down  . Head injury, closed, with concussion   . Insomnia    NO LONGER AN ISSUE  . Malignant neoplasm of lower-inner quadrant of right female breast ([place]) 06/11/2016  . Osteopenia   . Raynaud disease   . Seasonal allergies   . Spondylolisthesis    lumbar    Past Surgical History:  Procedure Laterality Date  . APPENDECTOMY    . BREAST LUMPECTOMY WITH RADIOACTIVE SEED LOCALIZATION Right 07/14/2016   Procedure: RIGHT BREAST LUMPECTOMY WITH RADIOACTIVE SEED LOCALIZATION;  Surgeon: Autumn Messing III, MD;  Location: [facility];  Service: General;  Laterality: Right;  . CHOLECYSTECTOMY    . COLONOSCOPY    . ENDOSCOPIC VEIN LASER TREATMENT     calf riht and left  . FOOT NEUROMA SURGERY     bilateral  . JOINT REPLACEMENT    . TONSILLECTOMY    . TOTAL KNEE ARTHROPLASTY Left 04/27/2013   Procedure: LEFT TOTAL KNEE ARTHROPLASTY;  Surgeon: Gearlean Alf, MD;  Location: [facility];  Service: Orthopedics;  Laterality: Left;  . TOTAL KNEE ARTHROPLASTY Right 08/27/2013   Procedure: RIGHT TOTAL KNEE  ARTHROPLASTY;  Surgeon: Gearlean Alf, MD;  Location: [facility];  Service: Orthopedics;  Laterality: Right;  . TUBAL LIGATION    . VAGINAL HYSTERECTOMY     tah/bso  . WISDOM TOOTH EXTRACTION      There were no vitals filed for this visit.      Subjective Assessment - 10/04/16 1117    Subjective Patient is feeling good overall. She feels like her pain level is back to her previous level 1-2/10 with ADLS.   Pertinent History Right lumpectomy for DCIS 07/14/16; radiation begins 08/09/16.   Patient Stated Goals Get arm back to normal   Currently in Pain? Yes   Pain Score 1    Pain Location Neck   Pain Orientation Right   Pain Descriptors / Indicators Aching   Pain Type Surgical pain   Pain Onset More than a month ago   Pain Frequency Intermittent   Aggravating Factors  carrying   Pain Relieving Factors unknown   Effect of Pain on Daily Activities aggravating            [facility] PT Assessment - 10/04/16 0001      Observation/Other Assessments   Other Surveys  --  Quick dash 6.82     AROM   Cervical - Right Side  Bend 12   Cervical - Left Side Bend 30   Cervical - Right Rotation full   Cervical - Left Rotation 50%              Quick Dash - 10/04/16 0001    Open a tight or new jar No difficulty   Do heavy household chores (wash walls, wash floors) No difficulty   Carry a shopping bag or briefcase No difficulty   Wash your back No difficulty   Use a knife to cut food No difficulty   Recreational activities in which you take some force or impact through your arm, shoulder, or hand (golf, hammering, tennis) No difficulty   During the past week, to what extent has your arm, shoulder or hand problem interfered with your normal social activities with family, friends, neighbors, or groups? Slightly   During the past week, to what extent has your arm, shoulder or hand problem limited your work or other regular daily activities Slightly   Arm, shoulder, or hand pain. Mild    Tingling (pins and needles) in your arm, shoulder, or hand None   Difficulty Sleeping No difficulty   DASH Score 6.82 %               [facility] Adult PT Treatment/Exercise - 10/04/16 0001      Shoulder Exercises: Stretch   Other Shoulder Stretches UT stretch B x 30 sec each   Other Shoulder Stretches R QL stretch in SDLY over pillow x 30 sec     Modalities   Modalities Electrical Stimulation;Moist Heat     Moist Heat Therapy   Number Minutes Moist Heat 15 Minutes   Moist Heat Location Cervical     Electrical Stimulation   Electrical Stimulation Location R UT   Electrical Stimulation Action premod   Electrical Stimulation Parameters 80-150 Hz x 74mn   Electrical Stimulation Goals Pain     Manual Therapy   Manual Therapy Soft tissue mobilization   Soft tissue mobilization to R UT and levator scapula          Trigger Point Dry Needling - 10/04/16 1204    Consent Given? Yes   Education Handout Provided No   Muscles Treated Upper Body Upper trapezius;Levator scapulae   Upper Trapezius Response Twitch reponse elicited;Palpable increased muscle length   Levator Scapulae Response Twitch response elicited;Palpable increased muscle length              PT Education - 10/04/16 1205    Education Details HEP - QL stretch   Person(s) Educated Patient   Methods Explanation;Demonstration;Handout;Verbal cues   Comprehension Verbalized understanding;Returned demonstration                Long Term Clinic Goals - 08/04/16 1520      CC Long Term Goal  #1   Title Increase cervical AROM to be WNL for improved mobility with daily tasks including driving.   Time 4   Period Weeks   Status Partially Met     CC Long Term Goal  #2   Title Report overall neck and arm pain decreased by >/= 50% to tolerate daily tasks with less discomfort.   Time 4   Period Weeks   Status Goal met     CC Long Term Goal  #3   Title Increase right shoulder flexion and abduction to >/=  155 degreesf or increased ease reaching.   Time 4   Period Weeks   Status Goal met  CC Long Term Goal  #4   Title Decrease Quick DASH score to </= 15 for improved functional use of right upper extremity.   Baseline    Time 4   Period Weeks   Status Goal Met            Plan - 10/24/2016 1206    Clinical Impression Statement Patient presented today with reports of only mild pain in R neck and shoulder to previous levels. She reports 80-90% improvement in in pain with ADLS meeting LTG #2. She had tension in R UT and lev scap today and responded well to DN. A R QL stretch was issued as this muscle may be pulling RUQ downward and putting continuing tension on UT and levator. Patient has significant improvement on Quick Dash to 6.82 meeting her LTG #4. She had previously met LTG #3 for shoudler ROM. She still has deficits in cervical R SB and left rotation so this LTG #1 was only partially met. Stretches were reviewed for HEP.   Rehab Potential Excellent   Clinical Impairments Affecting Rehab Potential Recent breast cancer and will be undergoing daily radiation   PT Frequency 2x / week   PT Duration 3 weeks   PT Treatment/Interventions ADLs/Self Care Home Management;Patient/family education;Passive range of motion;Manual techniques;Electrical Stimulation;Therapeutic activities;Therapeutic exercise;Moist Heat;Cryotherapy;Traction;Dry needling   PT Next Visit Plan see d/c summary   PT Home Exercise Plan R Ql stetch; prone T,Y, and shoulder ext; cervical tension stretches   Consulted and Agree with Plan of Care Patient      Patient will benefit from skilled therapeutic intervention in order to improve the following deficits and impairments:  Impaired UE functional use, Pain, Increased fascial restricitons, Postural dysfunction, Decreased range of motion  Visit Diagnosis: Cervicalgia  Radiculopathy, cervical region       G-Codes - 10/24/16 09-01-1215    Functional Assessment Tool Used  (Outpatient Only) Quick DASH = 6.82   Functional Limitation Carrying, moving and handling objects   Carrying, Moving and Handling Objects Current Status (O7564) At least 1 percent but less than 20 percent impaired, limited or restricted   Carrying, Moving and Handling Objects Goal Status (P3295) At least 1 percent but less than 20 percent impaired, limited or restricted   Carrying, Moving and Handling Objects Discharge Status [id number]) At least 1 percent but less than 20 percent impaired, limited or restricted      Problem List Patient Active Problem List   Diagnosis Date Noted  . Osteoporosis 06/20/2016  . Malignant neoplasm of lower-inner quadrant of right breast of female, estrogen receptor positive ([place]) 06/11/2016  . Cephalalgia 03/04/2016  . Diplopia 09/02/2015  . Neck pain on right side 09/02/2015  . Balance disorder 06/05/2015  . Neck pain 05/15/2015  . Headache 05/15/2015  . Postoperative anemia due to acute blood loss 08/28/2013  . OA (osteoarthritis) of knee 04/27/2013  . 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 Vaginal atrophy 05/25/2012  . CONSTIPATION 08/27/2009  . ULCERATION OF INTESTINE 08/27/2009    Madelyn Flavors PT 24-Oct-2016, 12:20 PM  [facility] [address] [place], [place], [address] Phone: [phone]   Fax:  [phone]  Name: GERI HEPLER MRN: [id number] Date of Birth: 12/10/1945   PHYSICAL THERAPY DISCHARGE SUMMARY  Visits from Start of Care: 14  Current functional level related to goals / functional outcomes: See above   Remaining deficits: See above   Education / Equipment: HEP Plan: Patient agrees to discharge.  Patient  goals were partially met. Patient is being discharged due to being pleased with the current functional level.  ?????         Madelyn Flavors, PT 10/04/16 12:23 PM [facility] [address] [place], [place],  [address] Phone: [phone]   Fax:  [phone]

## 2016-10-04 NOTE — Patient Instructions (Signed)
Low Back stretch over pillow   Lie on left side with a pillow or towel roll/yoga mat under your waist, back to edge of bed, top arm in front. Allow right leg to drape behind over edge. Hold 1-5 minutes.  1-2 x per day.   Madelyn Flavors, PT 10/04/16 11:51 AM [facility] [address] [place], [place], [address] Phone: [phone]   Fax:  [phone]

## 2016-10-05 ENCOUNTER — Ambulatory Visit
Admission: RE | Admit: 2016-10-05 | Discharge: 2016-10-05 | Disposition: A | Discharge status: Home / Self Care | Payer: Medicare Other | Source: Ambulatory Visit | Attending: Radiation Oncology | Admitting: Radiation Oncology

## 2016-10-05 ENCOUNTER — Encounter: Payer: Self-pay | Admitting: Radiation Oncology

## 2016-10-05 VITALS — BP 136/56 | HR 62 | Temp 97.9°F | Resp 18 | Ht 63.0 in | Wt 151.2 lb

## 2016-10-05 DIAGNOSIS — Z923 Personal history of irradiation: Secondary | ICD-10-CM | POA: Diagnosis not present

## 2016-10-05 DIAGNOSIS — D0511 Intraductal carcinoma in situ of right breast: Secondary | ICD-10-CM | POA: Diagnosis present

## 2016-10-05 DIAGNOSIS — Z170 Estrogen receptor positive status [ER+]: Secondary | ICD-10-CM

## 2016-10-05 DIAGNOSIS — Z79899 Other long term (current) drug therapy: Secondary | ICD-10-CM | POA: Insufficient documentation

## 2016-10-05 DIAGNOSIS — Z7982 Long term (current) use of aspirin: Secondary | ICD-10-CM | POA: Diagnosis not present

## 2016-10-05 DIAGNOSIS — C50311 Malignant neoplasm of lower-inner quadrant of right female breast: Secondary | ICD-10-CM

## 2016-10-05 NOTE — Progress Notes (Signed)
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    Interval Since Last Radiation:  4 weeks   08/09/16-09/03/16: 1.  Right breast/ 42.5 Gy in 17 fractions 2.  Right breast boost/ 7.5 Gy in 3 fractions  Narrative:  The patient returns today for routine follow-up. During treatment she did very well with radiotherapy and did not have significant desquamation.  She was started on Tamoxifen by Dr. Jana Hakim, and has follow up with him in June 2018.                         On review of systems, the patient states she's doing well since completing radiotherapy. She reports that she's using Vitamin E oil and that this has helped her skin tremendously. She is doing well with Premarin vaginal cream as well as tamoxifen. No other concerns are noted.  ALLERGIES:  is allergic to bee venom; ibuprofen; cephalexin; penicillins; and sulfonamide derivatives.  Meds: Current Outpatient Prescriptions  Medication Sig Dispense Refill  . aspirin 81 MG tablet Take 81 mg by mouth daily.    Marland Kitchen azithromycin (ZITHROMAX) 500 MG tablet Take 500 mg by mouth as directed. 500 mg by mouth 1 hour prior to dental work.    . cholecalciferol (VITAMIN D) 1000 units tablet Take 2,000 Units by mouth daily.    . cycloSPORINE (RESTASIS) 0.05 % ophthalmic emulsion Place 1 drop into both eyes 2 (two) times daily.    Marland Kitchen denosumab (PROLIA) 60 MG/ML SOLN injection Inject 60 mg into the skin every 6 (six) months. Administer in upper arm, thigh, or abdomen    . diphenhydrAMINE (BENADRYL) 25 mg capsule Take 25 mg by mouth every 6 (six) hours as needed (for congestion/allergies.).    Marland Kitchen hyaluronate sodium (RADIAPLEXRX) GEL Apply 1 application topically 2 (two) times daily.    Marland Kitchen loratadine (CLARITIN) 10 MG tablet  Take 10 mg by mouth as needed for allergies.    . Lutein 6 MG CAPS Take 6 mg by mouth 2 (two) times daily.     . Magnesium 250 MG TABS Take 250 mg by mouth 2 (two) times daily.     . Multiple Vitamin (MULTIVITAMIN WITH MINERALS) TABS tablet Take 1 tablet by mouth daily. Centrum Silver for Women    . nitrofurantoin, macrocrystal-monohydrate, (MACROBID) 100 MG capsule Take 100 mg by mouth daily as needed (for UTI prevention).    . non-metallic deodorant Jethro Poling) MISC Apply 1 application topically daily as needed.    . Omega-3 Fatty Acids (FISH OIL) 1200 MG CAPS Take 1,200 mg by mouth daily.    . Probiotic Product (ALIGN) 4 MG CAPS Take 4 mg by mouth at bedtime.    . sodium chloride (OCEAN) 0.65 % nasal spray Place 1 spray into the nose 2 (two) times daily as needed for congestion.     . triamcinolone (NASACORT AQ) 55 MCG/ACT AERO nasal inhaler Place 2 sprays into the nose daily as needed (for allergies.).    Marland Kitchen Vaginal Lubricant (REPLENS VA) Place 1 application vaginally every 3 (three) days.     No current facility-administered medications for this encounter.     Physical Findings:  vitals were not taken for this visit.  /10 In  general this is a well appearing Caucasian female in no acute distress. She's alert and oriented x4 and appropriate throughout the examination. Cardiopulmonary assessment is negative for acute distress and she exhibits normal effort. The right breast was examined and reveals a well healed scar form her lumpectomy site. No desquamation is noted.   Lab Findings: Lab Results  Component Value Date   WBC 5.5 07/14/2016   HGB 13.7 07/14/2016   HCT 40.1 07/14/2016   MCV 100.5 (H) 07/14/2016   PLT 338 07/14/2016     Radiographic Findings: No results found.  Impression/Plan: 1. 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 The patient has been doing well since completion of radiotherapy. We discussed that we would be happy to continue to follow her as needed, but she  will also continue to follow up with Dr. Jana Hakim  in medical oncology for management of her Tamoxifen. She was counseled on skin care as well as measures to avoid sun exposure to this area.  2. Survivorship. The patient is given information for resources in the cancer center for survivorship and will meet with the survivorship nurse practitioner in September 2018.     Carola Rhine, PAC

## 2016-10-05 NOTE — Addendum Note (Signed)
Encounter addended by: Malena Edman, RN on: 10/05/2016 11:26 AM<BR>    Actions taken: Charge Capture section accepted

## 2016-10-07 ENCOUNTER — Encounter: Payer: Self-pay | Admitting: Gastroenterology

## 2016-10-12 ENCOUNTER — Ambulatory Visit: Payer: Self-pay | Admitting: Radiation Oncology

## 2016-10-20 ENCOUNTER — Ambulatory Visit: Payer: Self-pay | Admitting: Radiation Oncology

## 2016-10-26 ENCOUNTER — Ambulatory Visit: Payer: Self-pay | Admitting: Radiation Oncology

## 2016-11-10 DIAGNOSIS — L821 Other seborrheic keratosis: Secondary | ICD-10-CM | POA: Diagnosis not present

## 2016-11-10 DIAGNOSIS — D2261 Melanocytic nevi of right upper limb, including shoulder: Secondary | ICD-10-CM | POA: Diagnosis not present

## 2016-11-10 DIAGNOSIS — L720 Epidermal cyst: Secondary | ICD-10-CM | POA: Diagnosis not present

## 2016-11-10 DIAGNOSIS — D2262 Melanocytic nevi of left upper limb, including shoulder: Secondary | ICD-10-CM | POA: Diagnosis not present

## 2016-11-10 DIAGNOSIS — Z85828 Personal history of other malignant neoplasm of skin: Secondary | ICD-10-CM | POA: Diagnosis not present

## 2016-11-19 ENCOUNTER — Ambulatory Visit (AMBULATORY_SURGERY_CENTER): Payer: Self-pay

## 2016-11-19 VITALS — Ht 63.0 in | Wt 153.8 lb

## 2016-11-19 DIAGNOSIS — Z1211 Encounter for screening for malignant neoplasm of colon: Secondary | ICD-10-CM

## 2016-11-19 MED ORDER — NA SULFATE-K SULFATE-MG SULF 17.5-3.13-1.6 GM/177ML PO SOLN: 1.0000 | Freq: Once | ORAL | 0 refills | Status: AC

## 2016-11-19 NOTE — Progress Notes (Signed)
Denies allergies to eggs or soy products. Denies complication of anesthesia or sedation. Denies use of weight loss medication. Denies use of O2.   Emmi instructions given for colonoscopy.  

## 2016-11-22 ENCOUNTER — Encounter: Payer: Self-pay | Admitting: Gastroenterology

## 2016-11-25 ENCOUNTER — Encounter (HOSPITAL_COMMUNITY): Payer: Self-pay | Admitting: Emergency Medicine

## 2016-11-25 ENCOUNTER — Telehealth: Payer: Self-pay | Admitting: Gastroenterology

## 2016-11-25 ENCOUNTER — Emergency Department (HOSPITAL_COMMUNITY)
Admission: EM | Admit: 2016-11-25 | Discharge: 2016-11-26 | Disposition: A | Discharge status: Home / Self Care | Payer: Medicare Other | Attending: Emergency Medicine | Admitting: Emergency Medicine

## 2016-11-25 DIAGNOSIS — Z7982 Long term (current) use of aspirin: Secondary | ICD-10-CM | POA: Insufficient documentation

## 2016-11-25 DIAGNOSIS — Y929 Unspecified place or not applicable: Secondary | ICD-10-CM | POA: Diagnosis not present

## 2016-11-25 DIAGNOSIS — R109 Unspecified abdominal pain: Secondary | ICD-10-CM | POA: Diagnosis not present

## 2016-11-25 DIAGNOSIS — Y999 Unspecified external cause status: Secondary | ICD-10-CM | POA: Diagnosis not present

## 2016-11-25 DIAGNOSIS — R112 Nausea with vomiting, unspecified: Secondary | ICD-10-CM

## 2016-11-25 DIAGNOSIS — W57XXXA Bitten or stung by nonvenomous insect and other nonvenomous arthropods, initial encounter: Secondary | ICD-10-CM | POA: Insufficient documentation

## 2016-11-25 DIAGNOSIS — G8929 Other chronic pain: Secondary | ICD-10-CM | POA: Insufficient documentation

## 2016-11-25 DIAGNOSIS — S80861A Insect bite (nonvenomous), right lower leg, initial encounter: Secondary | ICD-10-CM | POA: Diagnosis not present

## 2016-11-25 DIAGNOSIS — Y939 Activity, unspecified: Secondary | ICD-10-CM | POA: Insufficient documentation

## 2016-11-25 DIAGNOSIS — R1030 Lower abdominal pain, unspecified: Secondary | ICD-10-CM | POA: Insufficient documentation

## 2016-11-25 DIAGNOSIS — Z79899 Other long term (current) drug therapy: Secondary | ICD-10-CM | POA: Diagnosis not present

## 2016-11-25 DIAGNOSIS — R51 Headache: Secondary | ICD-10-CM | POA: Diagnosis not present

## 2016-11-25 DIAGNOSIS — R1110 Vomiting, unspecified: Secondary | ICD-10-CM | POA: Diagnosis not present

## 2016-11-25 MED ORDER — SODIUM CHLORIDE 0.9 % IV BOLUS (SEPSIS)
1000.0000 mL | Freq: Once | INTRAVENOUS | Status: AC
Start: 2016-11-26 — End: 2016-11-26
  Administered 2016-11-26: 1000 mL via INTRAVENOUS

## 2016-11-25 MED ORDER — ONDANSETRON HCL 4 MG/2ML IJ SOLN
4.0000 mg | Freq: Once | INTRAMUSCULAR | Status: AC
  Administered 2016-11-26: 4 mg via INTRAVENOUS
  Filled 2016-11-25: qty 2

## 2016-11-25 NOTE — ED Triage Notes (Signed)
Pt states she was bit by a tick on her right lower leg 2 weeks ago tomorrow   Pt states she went to her dermatologist and was given some cream to put on it  Pt states this morning she called her dermatologist back because of new sxs that include headache, chills, and low grade fever  Pt states she was given doxycycline to start  Pt states she took one this morning with a little food and within 5 minutes she vomited  Pt states she took another this evening and vomited again and has had 2 more episodes of vomiting  Pt is nauseated in triage  Pt states she talked to Dr Haynes Kerns with [facility] and was told to come in to be tested for RMSF and to get some fluids

## 2016-11-25 NOTE — Telephone Encounter (Signed)
Patient advised that it's okay to proceed with the procedure.

## 2016-11-25 NOTE — Telephone Encounter (Signed)
It's okay, she can proceed with procedure with taking antibiotics. Thanks

## 2016-11-25 NOTE — ED Notes (Signed)
Tech at bedside collecting labs 

## 2016-11-25 NOTE — ED Provider Notes (Signed)
[facility] DEPT Provider Note   CSN: [id number] Arrival date & time: 11/25/16  2229  By signing my name below, I, Jaquelyn Bitter., attest that this documentation has been prepared under the direction and in the presence of Pollina, Gwenyth Allegra, *. Electronically signed: Jaquelyn Bitter., ED Scribe. 11/26/16. 2:58 AM.   History   Chief Complaint Chief Complaint  Patient presents with  . Emesis    HPI Maria Jackson is a 71 y.o. female who presents to the Emergency Department complaining of emesis with onset x1 day. Pt states that x2 weeks ago she was bit by a tick on the R lower leg. She states that x1 day ago she developed headache, chills and low grade fever. She reports appetite change, nausea, vomiting, abdominal pain, myalgia. She has taken doxycycline with no relief due to vomiting. She denies cough, rhinorrhea. Of note, pt took laxative due to abdominal pain which caused diarrhea. Of note, pt states that she talked to Dr. Haynes Kerns with [facility] and was told to come in to be tested for RMSF.  The history is provided by the patient. No language interpreter was used.    Past Medical History:  Diagnosis Date  . Allergy   . Arthritis    knees  . 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 Cancer ([place]) 11/05/11   SQUAMOS CELL CARCINOMA - LEFT CALF  . Cataract   . Chronic lower back pain   . Concussion   . Diplopia    left eye - when she looks down  . Head injury, closed, with concussion   . Insomnia    NO LONGER AN ISSUE  . Malignant neoplasm of lower-inner quadrant of right female breast ([place]) 06/11/2016  . Osteopenia   . Osteoporosis   . Raynaud disease   . Seasonal allergies   . Spondylolisthesis    lumbar    Patient Active Problem List   Diagnosis Date Noted  . Osteoporosis 06/20/2016  . Malignant neoplasm of lower-inner quadrant of right breast of female, estrogen receptor positive ([place]) 06/11/2016  . Cephalalgia 03/04/2016  .  Diplopia 09/02/2015  . Neck pain on right side 09/02/2015  . Balance disorder 06/05/2015  . Neck pain 05/15/2015  . Headache 05/15/2015  . Postoperative anemia due to acute blood loss 08/28/2013  . OA (osteoarthritis) of knee 04/27/2013  . 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 Vaginal atrophy 05/25/2012  . CONSTIPATION 08/27/2009  . ULCERATION OF INTESTINE 08/27/2009    Past Surgical History:  Procedure Laterality Date  . APPENDECTOMY    . BREAST LUMPECTOMY WITH RADIOACTIVE SEED LOCALIZATION Right 07/14/2016   Procedure: RIGHT BREAST LUMPECTOMY WITH RADIOACTIVE SEED LOCALIZATION;  Surgeon: Autumn Messing III, MD;  Location: [facility];  Service: General;  Laterality: Right;  . CHOLECYSTECTOMY    . COLONOSCOPY    . ENDOSCOPIC VEIN LASER TREATMENT     calf riht and left  . FOOT NEUROMA SURGERY     bilateral  . JOINT REPLACEMENT    . TONSILLECTOMY    . TOTAL KNEE ARTHROPLASTY Left 04/27/2013   Procedure: LEFT TOTAL KNEE ARTHROPLASTY;  Surgeon: Gearlean Alf, MD;  Location: [facility];  Service: Orthopedics;  Laterality: Left;  . TOTAL KNEE ARTHROPLASTY Right 08/27/2013   Procedure: RIGHT TOTAL KNEE ARTHROPLASTY;  Surgeon: Gearlean Alf, MD;  Location: [facility];  Service: Orthopedics;  Laterality: Right;  . TUBAL LIGATION    . VAGINAL HYSTERECTOMY  tah/bso  . WISDOM TOOTH EXTRACTION      OB History    Gravida Para Term Preterm AB Living   2 2 2     2    SAB TAB Ectopic Multiple Live Births           2       Home Medications    Prior to Admission medications   Medication Sig Start Date End Date Taking? Authorizing Provider  aspirin 81 MG tablet Take 81 mg by mouth daily.    [provider]  azithromycin (ZITHROMAX) 500 MG tablet Take 500 mg by mouth as directed. 500 mg by mouth 1 hour prior to dental work.    [provider]  cholecalciferol (VITAMIN D) 1000 units tablet Take 2,000 Units by mouth daily.    [provider]  conjugated  estrogens (PREMARIN) vaginal cream Place 1 Applicatorful vaginally 2 (two) times a week. Taking .5 of the applicator twice a week    [provider]  cycloSPORINE (RESTASIS) 0.05 % ophthalmic emulsion Place 1 drop into both eyes 2 (two) times daily.    [provider]  denosumab (PROLIA) 60 MG/ML SOLN injection Inject 60 mg into the skin every 6 (six) months. Administer in upper arm, thigh, or abdomen    [provider]  loratadine (CLARITIN) 10 MG tablet Take 10 mg by mouth as needed for allergies.    [provider]  Lutein 6 MG CAPS Take 6 mg by mouth 2 (two) times daily.     [provider]  Magnesium 250 MG TABS Take 250 mg by mouth 2 (two) times daily.     [provider]  Multiple Vitamin (MULTIVITAMIN WITH MINERALS) TABS tablet Take 1 tablet by mouth daily. Centrum Silver for Women    [provider]  nitrofurantoin, macrocrystal-monohydrate, (MACROBID) 100 MG capsule Take 100 mg by mouth daily as needed (for UTI prevention).    [provider]  Omega-3 Fatty Acids (FISH OIL) 1200 MG CAPS Take 1,200 mg by mouth daily.    [provider]  Probiotic Product (ALIGN) 4 MG CAPS Take 4 mg by mouth at bedtime.    [provider]  sodium chloride (OCEAN) 0.65 % nasal spray Place 1 spray into the nose 2 (two) times daily as needed for congestion.     [provider]  tamoxifen (NOLVADEX) 20 MG tablet Take 20 mg by mouth daily.    [provider]  triamcinolone (NASACORT AQ) 55 MCG/ACT AERO nasal inhaler Place 2 sprays into the nose daily as needed (for allergies.).    [provider]    Family History Family History  Problem Relation Age of Onset  . Diabetes Mother        CVA - 07/06/16  . Heart disease Mother   . Heart disease Father        Cancer of eye, Multiple Mylemona  . Colon cancer Neg Hx   . Esophageal cancer Neg Hx   . Stomach cancer Neg Hx   . Rectal cancer Neg Hx       Social History Social History  Substance Use Topics  . Smoking status: Never Smoker  . Smokeless tobacco: Never Used  . Alcohol use 1.2 oz/week    2 Glasses of wine per week     Comment: red wine...  1/3 GLASS at dinner     Allergies   Bee venom; Ibuprofen; Cephalexin; Penicillins; and Sulfonamide derivatives   Review of Systems  Review of Systems  Constitutional: Positive for chills and fever.  HENT: Negative for rhinorrhea.   Respiratory: Negative for cough.   Gastrointestinal: Positive for abdominal pain, diarrhea, nausea and vomiting.  Musculoskeletal: Positive for myalgias.  Neurological: Positive for headaches.     Physical Exam Updated Vital Signs BP 123/73 (BP Location: Left Arm)   Pulse 77   Temp 98.6 F (37 C) (Oral)   Resp 14   Ht 5\' 3"  (1.6 m)   Wt 66.7 kg (147 lb)   SpO2 97%   BMI 26.04 kg/m   Physical Exam  Constitutional: She is oriented to person, place, and time. She appears well-developed and well-nourished. No distress.  HENT:  Head: Normocephalic and atraumatic.  Right Ear: Hearing normal.  Left Ear: Hearing normal.  Nose: Nose normal.  Mouth/Throat: Oropharynx is clear and moist and mucous membranes are normal.  Eyes: Conjunctivae and EOM are normal. Pupils are equal, round, and reactive to light.  Neck: Normal range of motion. Neck supple.  Cardiovascular: Regular rhythm, S1 normal and S2 normal.  Exam reveals no gallop and no friction rub.   No murmur heard. Pulmonary/Chest: Effort normal and breath sounds normal. No respiratory distress. She exhibits no tenderness.  Abdominal: Soft. Normal appearance and bowel sounds are normal. There is no hepatosplenomegaly. There is tenderness. There is no rebound, no guarding, no tenderness at McBurney's point and negative Murphy's sign. No hernia.  Mild diffuse abdominal tenderness.   Musculoskeletal: Normal range of motion.  Neurological: She is alert and oriented to person, place, and time.  She has normal strength. No cranial nerve deficit or sensory deficit. Coordination normal. GCS eye subscore is 4. GCS verbal subscore is 5. GCS motor subscore is 6.  Skin: Skin is warm, dry and intact. No rash noted. No cyanosis.  2cm slightly raised erythematous spot on the R lower leg.   Psychiatric: She has a normal mood and affect. Her speech is normal and behavior is normal. Thought content normal.  Nursing note and vitals reviewed.    ED Treatments / Results   DIAGNOSTIC STUDIES: Oxygen Saturation is 99% on RA, normal by my interpretation.   COORDINATION OF CARE: 2:58 AM-Discussed next steps with pt. Pt verbalized understanding and is agreeable with the plan.    Labs (all labs ordered are listed, but only abnormal results are displayed) Labs Reviewed  CBC WITH DIFFERENTIAL/PLATELET - Abnormal; Notable for the following:       Result Value   MCH 35.1 (*)    All other components within normal limits  COMPREHENSIVE METABOLIC PANEL - Abnormal; Notable for the following:    Sodium 132 (*)    Chloride 100 (*)    Glucose, Bld 111 (*)    Calcium 8.3 (*)    All other components within normal limits  URINALYSIS, ROUTINE W REFLEX MICROSCOPIC - Abnormal; Notable for the following:    Hgb urine dipstick MODERATE (*)    Ketones, ur 20 (*)    Squamous Epithelial / LPF 0-5 (*)    All other components within normal limits  LIPASE, BLOOD  ROCKY MTN SPOTTED FVR ABS PNL(IGG+IGM)  B. BURGDORFI ANTIBODIES    EKG  EKG Interpretation None       Radiology Dg Chest 2 View  Result Date: 11/26/2016 CLINICAL DATA:  Patient was bitten by a tick on the right lower leg 2 weeks ago. Now with headache, chills, and low grade fever. Started on doxycycline by dermatologist. Vomiting. EXAM: CHEST  2 VIEW  COMPARISON:  None. FINDINGS: Normal heart size and pulmonary vascularity. No focal airspace disease or consolidation in the lungs. No blunting of costophrenic angles. No pneumothorax. Mediastinal  contours appear intact. Degenerative changes in the spine. Surgical clips in the right breast and anterior abdominal wall. IMPRESSION: No active cardiopulmonary disease. Electronically Signed   By: Lucienne Capers M.D.   On: 11/26/2016 00:33   Ct Abdomen Pelvis W Contrast  Result Date: 11/26/2016 CLINICAL DATA:  Acute onset of generalized abdominal pain and myalgia. Nausea and vomiting. Initial encounter. EXAM: CT ABDOMEN AND PELVIS WITH CONTRAST TECHNIQUE: Multidetector CT imaging of the abdomen and pelvis was performed using the standard protocol following bolus administration of intravenous contrast. CONTRAST:  100 mL of Isovue 300 IV contrast COMPARISON:  None. FINDINGS: Lower chest: The visualized lung bases are grossly clear. The visualized portions of the mediastinum are unremarkable. Hepatobiliary: There is dilatation of the common bile duct to 1.9 cm, with diffuse intrahepatic biliary ductal dilatation. This may remain within normal limits status post cholecystectomy, though would correlate clinically to exclude postcholecystectomy syndrome. The liver is otherwise unremarkable. Pancreas: The pancreas is within normal limits. Spleen: The spleen is unremarkable in appearance. Adrenals/Urinary Tract: The adrenal glands are unremarkable in appearance. The kidneys are within normal limits. There is no evidence of hydronephrosis. No renal or ureteral stones are identified. No perinephric stranding is seen. Stomach/Bowel: The stomach is unremarkable in appearance. The small bowel is within normal limits. The patient is status post appendectomy. Mild diverticulosis is noted at the sigmoid colon, without evidence of diverticulitis. Vascular/Lymphatic: The abdominal aorta is unremarkable in appearance. The inferior vena cava is grossly unremarkable. No retroperitoneal lymphadenopathy is seen. No pelvic sidewall lymphadenopathy is identified. Reproductive: The bladder is mildly distended and grossly unremarkable.  The patient is status post hysterectomy. No suspicious adnexal masses are seen. Other: No additional soft tissue abnormalities are seen. Musculoskeletal: No acute osseous abnormalities are identified. There is grade 2 anterolisthesis of L4 on L5, reflecting underlying facet disease. Vacuum phenomenon is noted at L4-L5. The visualized musculature is unremarkable in appearance. IMPRESSION: 1. No acute abnormality seen to explain the patient's symptoms. 2. Dilatation of the common bile duct to 1.9 cm, with diffuse intrahepatic biliary duct dilatation. This may remain within normal limits status post cholecystectomy, though would correlate clinically to exclude postcholecystectomy syndrome. 3. Mild diverticulosis at the sigmoid colon, without evidence of diverticulitis. 4. Grade 2 anterolisthesis of L4 on L5, reflecting underlying facet disease. Electronically Signed   By: Garald Balding M.D.   On: 11/26/2016 02:14    Procedures Procedures (including critical care time)  Medications Ordered in ED Medications  doxycycline (VIBRAMYCIN) 100 mg in dextrose 5 % 250 mL IVPB (not administered)  sodium chloride 0.9 % bolus 1,000 mL (1,000 mLs Intravenous New Bag/Given 11/26/16 0111)  ondansetron (ZOFRAN) injection 4 mg (4 mg Intravenous Given 11/26/16 0112)  iopamidol (ISOVUE-300) 61 % injection 100 mL (100 mLs Intravenous Contrast Given 11/26/16 0146)     Initial Impression / Assessment and Plan / ED Course  I have reviewed the triage vital signs and the nursing notes.  Pertinent labs & imaging results that were available during my care of the patient were reviewed by me and considered in my medical decision making (see chart for details).     Patient presents to the emergency department for evaluation of fever, chills, nausea and vomiting. Patient had a tick bite 2 weeks ago. She just developed the symptoms yesterday. She started having  headache, chills, sweats. Her doctor empirically started doxycycline.  Patient reports that she had acute nausea and vomiting after starting the doxycycline.  Patient's workup has been benign. She has a normal comprehensive metabolic panel, normal lipase, normal CBC including white blood cell count of 5.6. Urine is clear, no sign of infection. Patient was having some abdominal discomfort, CT abdomen and pelvis was performed, no acute pathology noted. Chest x-ray was clear, no evidence of pneumonia.  At this point, recommended spotted fever is still considered a strong possibility. She was given IV doxycycline here in the ER because of her intolerance to oral at home. She does not appear ill at this time, does not require hospitalization. She feels much better after IV fluids and Zofran. Will continue Zofran 30 minutes prior to taking the doxycycline tablets. Rickettsial disease antibody titers pending.  Final Clinical Impressions(s) / ED Diagnoses   Final diagnoses:  Tick bite, initial encounter  Non-intractable vomiting with nausea, unspecified vomiting type    New Prescriptions New Prescriptions   No medications on file   I personally performed the services described in this documentation, which was scribed in my presence. The recorded information has been reviewed and is accurate.     Orpah Greek, MD 11/26/16 [phone]

## 2016-11-25 NOTE — Telephone Encounter (Signed)
Message routed to Dr. Armbruster. 

## 2016-11-26 ENCOUNTER — Encounter (HOSPITAL_COMMUNITY): Payer: Self-pay

## 2016-11-26 ENCOUNTER — Emergency Department (HOSPITAL_COMMUNITY): Payer: Medicare Other

## 2016-11-26 DIAGNOSIS — R51 Headache: Secondary | ICD-10-CM | POA: Diagnosis not present

## 2016-11-26 DIAGNOSIS — R109 Unspecified abdominal pain: Secondary | ICD-10-CM | POA: Diagnosis not present

## 2016-11-26 DIAGNOSIS — R112 Nausea with vomiting, unspecified: Secondary | ICD-10-CM | POA: Diagnosis not present

## 2016-11-26 DIAGNOSIS — R1110 Vomiting, unspecified: Secondary | ICD-10-CM | POA: Diagnosis not present

## 2016-11-26 LAB — COMPREHENSIVE METABOLIC PANEL
ALBUMIN: 3.7 g/dL (ref 3.5–5.0)
ALT: 26 U/L (ref 14–54)
ANION GAP: 9 (ref 5–15)
AST: 34 U/L (ref 15–41)
Alkaline Phosphatase: 39 U/L (ref 38–126)
BUN: 12 mg/dL (ref 6–20)
CO2: 23 mmol/L (ref 22–32)
Calcium: 8.3 mg/dL — ABNORMAL LOW (ref 8.9–10.3)
Chloride: 100 mmol/L — ABNORMAL LOW (ref 101–111)
Creatinine, Ser: 0.62 mg/dL (ref 0.44–1.00)
GFR calc Af Amer: >60 mL/min (ref >60)
GFR calc non Af Amer: >60 mL/min (ref >60)
GLUCOSE: 111 mg/dL — AB (ref 65–99)
POTASSIUM: 3.9 mmol/L (ref 3.5–5.1)
SODIUM: 132 mmol/L — AB (ref 135–145)
TOTAL PROTEIN: 6.7 g/dL (ref 6.5–8.1)
Total Bilirubin: 0.4 mg/dL (ref 0.3–1.2)

## 2016-11-26 LAB — CBC WITH DIFFERENTIAL/PLATELET
BASOS PCT: 0 %
Basophils Absolute: 0 10*3/uL (ref 0.0–0.1)
EOS ABS: 0 10*3/uL (ref 0.0–0.7)
EOS PCT: 0 %
HCT: 39 % (ref 36.0–46.0)
Hemoglobin: 13.9 g/dL (ref 12.0–15.0)
Lymphocytes Relative: 13 %
Lymphs Abs: 0.8 10*3/uL (ref 0.7–4.0)
MCH: 35.1 pg — ABNORMAL HIGH (ref 26.0–34.0)
MCHC: 35.6 g/dL (ref 30.0–36.0)
MCV: 98.5 fL (ref 78.0–100.0)
MONO ABS: 0.6 10*3/uL (ref 0.1–1.0)
MONOS PCT: 10 %
Neutro Abs: 4.3 10*3/uL (ref 1.7–7.7)
Neutrophils Relative %: 77 %
Platelets: 280 10*3/uL (ref 150–400)
RBC: 3.96 MIL/uL (ref 3.87–5.11)
RDW: 13 % (ref 11.5–15.5)
WBC: 5.6 10*3/uL (ref 4.0–10.5)

## 2016-11-26 LAB — URINALYSIS, ROUTINE W REFLEX MICROSCOPIC
BACTERIA UA: NONE SEEN
BILIRUBIN URINE: NEGATIVE
Glucose, UA: NEGATIVE mg/dL
Ketones, ur: 20 mg/dL — AB
LEUKOCYTES UA: NEGATIVE
NITRITE: NEGATIVE
Protein, ur: NEGATIVE mg/dL
SPECIFIC GRAVITY, URINE: 1.027 (ref 1.005–1.030)
pH: 6 (ref 5.0–8.0)

## 2016-11-26 LAB — LIPASE, BLOOD: Lipase: 14 U/L (ref 11–51)

## 2016-11-26 MED ORDER — IOPAMIDOL (ISOVUE-300) INJECTION 61%
INTRAVENOUS | Status: AC
  Administered 2016-11-26: 100 mL via INTRAVENOUS
  Filled 2016-11-26: qty 100

## 2016-11-26 MED ORDER — DOXYCYCLINE HYCLATE 100 MG IV SOLR
100.0000 mg | Freq: Once | INTRAVENOUS | Status: AC
  Administered 2016-11-26: 100 mg via INTRAVENOUS
  Filled 2016-11-26: qty 100

## 2016-11-26 MED ORDER — ONDANSETRON HCL 4 MG/2ML IJ SOLN
4.0000 mg | Freq: Once | INTRAMUSCULAR | Status: AC
  Administered 2016-11-26: 4 mg via INTRAVENOUS
  Filled 2016-11-26: qty 2

## 2016-11-26 MED ORDER — IOPAMIDOL (ISOVUE-300) INJECTION 61%
100.0000 mL | Freq: Once | INTRAVENOUS | Status: AC | PRN
  Administered 2016-11-26: 100 mL via INTRAVENOUS

## 2016-11-26 MED ORDER — ONDANSETRON HCL 4 MG PO TABS: 4.0000 mg | ORAL_TABLET | Freq: Four times a day (QID) | ORAL | 0 refills | Status: DC

## 2016-11-26 NOTE — ED Notes (Signed)
Patient states she started aching and chills all over Tuesday. Patient got sick when she took her antibiotics Thursday. Patient has not been able to keep food or drink down. Patient is not having pain anywhere. She feels nauseas.

## 2016-11-26 NOTE — ED Notes (Signed)
Requested patient to urinate. 

## 2016-11-29 ENCOUNTER — Emergency Department (HOSPITAL_COMMUNITY)
Admission: EM | Admit: 2016-11-29 | Discharge: 2016-11-29 | Disposition: A | Discharge status: Home / Self Care | Payer: Medicare Other | Attending: Emergency Medicine | Admitting: Emergency Medicine

## 2016-11-29 ENCOUNTER — Encounter (HOSPITAL_COMMUNITY): Payer: Self-pay

## 2016-11-29 DIAGNOSIS — Z7982 Long term (current) use of aspirin: Secondary | ICD-10-CM | POA: Diagnosis not present

## 2016-11-29 DIAGNOSIS — Z853 Personal history of malignant neoplasm of breast: Secondary | ICD-10-CM | POA: Insufficient documentation

## 2016-11-29 DIAGNOSIS — R197 Diarrhea, unspecified: Secondary | ICD-10-CM

## 2016-11-29 DIAGNOSIS — Z79899 Other long term (current) drug therapy: Secondary | ICD-10-CM | POA: Insufficient documentation

## 2016-11-29 DIAGNOSIS — B349 Viral infection, unspecified: Secondary | ICD-10-CM | POA: Diagnosis not present

## 2016-11-29 DIAGNOSIS — R072 Precordial pain: Secondary | ICD-10-CM | POA: Insufficient documentation

## 2016-11-29 DIAGNOSIS — R112 Nausea with vomiting, unspecified: Secondary | ICD-10-CM

## 2016-11-29 LAB — DIFFERENTIAL
Basophils Absolute: 0 10*3/uL (ref 0.0–0.1)
Basophils Relative: 0 %
Eosinophils Absolute: 0 10*3/uL (ref 0.0–0.7)
Eosinophils Relative: 0 %
LYMPHS ABS: 0.5 10*3/uL — AB (ref 0.7–4.0)
LYMPHS PCT: 8 %
MONO ABS: 0.8 10*3/uL (ref 0.1–1.0)
Monocytes Relative: 13 %
NEUTROS ABS: 4.7 10*3/uL (ref 1.7–7.7)
NEUTROS PCT: 79 %

## 2016-11-29 LAB — B. BURGDORFI ANTIBODIES

## 2016-11-29 LAB — URINALYSIS, ROUTINE W REFLEX MICROSCOPIC
Bacteria, UA: NONE SEEN
Bilirubin Urine: NEGATIVE
GLUCOSE, UA: NEGATIVE mg/dL
Ketones, ur: 5 mg/dL — AB
Leukocytes, UA: NEGATIVE
Nitrite: NEGATIVE
Protein, ur: NEGATIVE mg/dL
SPECIFIC GRAVITY, URINE: 1.006 (ref 1.005–1.030)
pH: 6 (ref 5.0–8.0)

## 2016-11-29 LAB — CBC
HCT: 39.4 % (ref 36.0–46.0)
Hemoglobin: 13.5 g/dL (ref 12.0–15.0)
MCH: 33.8 pg (ref 26.0–34.0)
MCHC: 34.3 g/dL (ref 30.0–36.0)
MCV: 98.7 fL (ref 78.0–100.0)
PLATELETS: 309 10*3/uL (ref 150–400)
RBC: 3.99 MIL/uL (ref 3.87–5.11)
RDW: 12.8 % (ref 11.5–15.5)
WBC: 6 10*3/uL (ref 4.0–10.5)

## 2016-11-29 LAB — LIPASE, BLOOD: LIPASE: 14 U/L (ref 11–51)

## 2016-11-29 LAB — COMPREHENSIVE METABOLIC PANEL
ALBUMIN: 3.4 g/dL — AB (ref 3.5–5.0)
ALK PHOS: 39 U/L (ref 38–126)
ALT: 25 U/L (ref 14–54)
AST: 35 U/L (ref 15–41)
Anion gap: 8 (ref 5–15)
BUN: 9 mg/dL (ref 6–20)
CHLORIDE: 100 mmol/L — AB (ref 101–111)
CO2: 24 mmol/L (ref 22–32)
CREATININE: 0.67 mg/dL (ref 0.44–1.00)
Calcium: 7.1 mg/dL — ABNORMAL LOW (ref 8.9–10.3)
GFR calc Af Amer: >60 mL/min (ref >60)
GFR calc non Af Amer: >60 mL/min (ref >60)
GLUCOSE: 93 mg/dL (ref 65–99)
Potassium: 3.3 mmol/L — ABNORMAL LOW (ref 3.5–5.1)
SODIUM: 132 mmol/L — AB (ref 135–145)
Total Bilirubin: 0.6 mg/dL (ref 0.3–1.2)
Total Protein: 6.2 g/dL — ABNORMAL LOW (ref 6.5–8.1)

## 2016-11-29 LAB — TROPONIN I

## 2016-11-29 MED ORDER — ONDANSETRON HCL 4 MG PO TABS
4.0000 mg | ORAL_TABLET | Freq: Once | ORAL | Status: DC
Start: 2016-11-29 — End: 2016-11-29
  Filled 2016-11-29: qty 1

## 2016-11-29 MED ORDER — SODIUM CHLORIDE 0.9 % IV BOLUS (SEPSIS)
1000.0000 mL | Freq: Once | INTRAVENOUS | Status: AC
  Administered 2016-11-29: 1000 mL via INTRAVENOUS

## 2016-11-29 MED ORDER — ONDANSETRON HCL 4 MG/2ML IJ SOLN
4.0000 mg | Freq: Once | INTRAMUSCULAR | Status: AC
  Administered 2016-11-29: 4 mg via INTRAVENOUS
  Filled 2016-11-29: qty 2

## 2016-11-29 MED ORDER — SODIUM CHLORIDE 0.9 % IV SOLN
1000.0000 mL | INTRAVENOUS | Status: DC
  Administered 2016-11-29: 1000 mL via INTRAVENOUS

## 2016-11-29 MED ORDER — FAMOTIDINE 20 MG PO TABS
20.0000 mg | ORAL_TABLET | Freq: Once | ORAL | Status: AC
  Administered 2016-11-29: 20 mg via ORAL
  Filled 2016-11-29: qty 1

## 2016-11-29 MED ORDER — DOXYCYCLINE HYCLATE 100 MG IV SOLR
100.0000 mg | Freq: Once | INTRAVENOUS | Status: AC
  Administered 2016-11-29: 100 mg via INTRAVENOUS
  Filled 2016-11-29: qty 100

## 2016-11-29 MED ORDER — DOXYCYCLINE HYCLATE 100 MG IV SOLR
INTRAVENOUS | Status: AC
  Filled 2016-11-29: qty 100

## 2016-11-29 NOTE — Discharge Instructions (Signed)
Your vital signs after IV fluids are well within normal limits. Your oxygen level is 100% on room air, within normal limits by my interpretation. Your lab work shows your potassium and your proteins to be slightly low. This is probably related to the vomiting and diarrhea. You've been given IV fluids today. Please have your doctor follow-up on your chemistries. Your electrocardiogram and your cardiac marker test is negative for any acute cardiac event.  I suspect that you have a viral illness causing GI symptoms and weakness. I am concerned however for possible C. difficile infection. Please provide your primary doctor with a stool sample so this can be tested. Please increase water, Gatorade, Kool-Aid, liquids, etc. Please monitor for temperature elevations. See your doctor on tomorrow at noon scheduled. Please return immediately to the emergency department if any changes, problems, or concerns.  Please use over-the-counter Prilosec 2 tablets daily. Please use Zantac 150 mg in the morning and in the evening. This will help with reflux/heartburn.

## 2016-11-29 NOTE — ED Notes (Signed)
Pt denies being faint or dizzy while doing orthostatic VS.  She states that she feels tired.  Pt was able to ambulate in the hall and tolerated this well

## 2016-11-29 NOTE — ED Notes (Signed)
AC notified to mix antibiotic.

## 2016-11-29 NOTE — ED Triage Notes (Signed)
PAtient reports of recent tick bite was given doxycycline although vomits when patient takes it. Patient was seen at [facility] 4 days ago for same, patient reports of feeling worse. Complains of fatigue, nausea, vomiting, diarrhea, chills and headache.

## 2016-11-29 NOTE — ED Provider Notes (Signed)
[facility] DEPT Provider Note   CSN: [id number] Arrival date & time: 11/29/16  1131  By signing my name below, I, Collene Leyden, attest that this documentation has been prepared under the direction and in the presence of [employer] . Electronically Signed: Collene Leyden, Scribe. 11/29/16. 4:35 PM.  History   Chief Complaint Chief Complaint  Patient presents with  . Emesis    HPI Comments: Maria Jackson is a 71 y.o. female with no pertinent past medical history, who presents to the Emergency Department complaining of sudden-onset, intermittent emesis that began several days ago. Patient states she was bit by a tick 2 weeks ago, in which she was seen by her dermatologist. Patient reports developing headaches and chills one week later, she was prescribed doxycycline by dermatologist. Patient was advised to go to the ED. She was seen at [facility] 4 days ago for emesis, in which she was placed on IV doxycycline and IV Zofran. Patient then reports developing emesis, several episodes. Patient states her symptoms are gradually worsening. Patient reports speaking to her PCP today, in which she was advised to return to the ED. Patient reports associated fatigue, nausea, diarrhea, chills, headache, and resolved substernal chest pain. No relief with antibiotics. Patient denies any fever, abdominal pain, or dizziness.   The history is provided by the patient. No language interpreter was used.    Past Medical History:  Diagnosis Date  . Allergy   . Arthritis    knees  . 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 Cancer ([place]) 11/05/11   SQUAMOS CELL CARCINOMA - LEFT CALF  . Cataract   . Chronic lower back pain   . Concussion   . Diplopia    left eye - when she looks down  . Head injury, closed, with concussion   . Insomnia    NO LONGER AN ISSUE  . Malignant neoplasm of lower-inner quadrant of right female breast ([place]) 06/11/2016  . Osteopenia   . Osteoporosis   . Raynaud  disease   . Seasonal allergies   . Spondylolisthesis    lumbar    Patient Active Problem List   Diagnosis Date Noted  . Osteoporosis 06/20/2016  . Malignant neoplasm of lower-inner quadrant of right breast of female, estrogen receptor positive ([place]) 06/11/2016  . Cephalalgia 03/04/2016  . Diplopia 09/02/2015  . Neck pain on right side 09/02/2015  . Balance disorder 06/05/2015  . Neck pain 05/15/2015  . Headache 05/15/2015  . Postoperative anemia due to acute blood loss 08/28/2013  . OA (osteoarthritis) of knee 04/27/2013  . 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 Vaginal atrophy 05/25/2012  . CONSTIPATION 08/27/2009  . ULCERATION OF INTESTINE 08/27/2009    Past Surgical History:  Procedure Laterality Date  . APPENDECTOMY    . BREAST LUMPECTOMY WITH RADIOACTIVE SEED LOCALIZATION Right 07/14/2016   Procedure: RIGHT BREAST LUMPECTOMY WITH RADIOACTIVE SEED LOCALIZATION;  Surgeon: Autumn Messing III, MD;  Location: [facility];  Service: General;  Laterality: Right;  . CHOLECYSTECTOMY    . COLONOSCOPY    . ENDOSCOPIC VEIN LASER TREATMENT     calf riht and left  . FOOT NEUROMA SURGERY     bilateral  . JOINT REPLACEMENT    . TONSILLECTOMY    . TOTAL KNEE ARTHROPLASTY Left 04/27/2013   Procedure: LEFT TOTAL KNEE ARTHROPLASTY;  Surgeon: Gearlean Alf, MD;  Location: [facility];  Service: Orthopedics;  Laterality: Left;  . TOTAL KNEE ARTHROPLASTY Right 08/27/2013  Procedure: RIGHT TOTAL KNEE ARTHROPLASTY;  Surgeon: Gearlean Alf, MD;  Location: [facility];  Service: Orthopedics;  Laterality: Right;  . TUBAL LIGATION    . VAGINAL HYSTERECTOMY     tah/bso  . WISDOM TOOTH EXTRACTION      OB History    Gravida Para Term Preterm AB Living   2 2 2     2    SAB TAB Ectopic Multiple Live Births           2       Home Medications    Prior to Admission medications   Medication Sig Start Date End Date Taking? Authorizing Provider  aspirin 81 MG tablet Take 81 mg by mouth daily.     [provider]  azithromycin (ZITHROMAX) 500 MG tablet Take 500 mg by mouth as directed. 500 mg by mouth 1 hour prior to dental work.    [provider]  cholecalciferol (VITAMIN D) 1000 units tablet Take 2,000 Units by mouth daily.    [provider]  conjugated estrogens (PREMARIN) vaginal cream Place 1 Applicatorful vaginally 2 (two) times a week. Taking .5 of the applicator twice a week    [provider]  cycloSPORINE (RESTASIS) 0.05 % ophthalmic emulsion Place 1 drop into both eyes 2 (two) times daily.    [provider]  denosumab (PROLIA) 60 MG/ML SOLN injection Inject 60 mg into the skin every 6 (six) months. Administer in upper arm, thigh, or abdomen    [provider]  loratadine (CLARITIN) 10 MG tablet Take 10 mg by mouth as needed for allergies.    [provider]  Lutein 6 MG CAPS Take 6 mg by mouth 2 (two) times daily.     [provider]  Magnesium 250 MG TABS Take 250 mg by mouth 2 (two) times daily.     [provider]  Multiple Vitamin (MULTIVITAMIN WITH MINERALS) TABS tablet Take 1 tablet by mouth daily. Centrum Silver for Women    [provider]  nitrofurantoin, macrocrystal-monohydrate, (MACROBID) 100 MG capsule Take 100 mg by mouth daily as needed (for UTI prevention).    [provider]  Omega-3 Fatty Acids (FISH OIL) 1200 MG CAPS Take 1,200 mg by mouth daily.    [provider]  ondansetron (ZOFRAN) 4 MG tablet Take 1 tablet (4 mg total) by mouth every 6 (six) hours. 11/26/16   Orpah Greek, MD  Probiotic Product (ALIGN) 4 MG CAPS Take 4 mg by mouth at bedtime.    [provider]  sodium chloride (OCEAN) 0.65 % nasal spray Place 1 spray into the nose 2 (two) times daily as needed for congestion.     [provider]  tamoxifen (NOLVADEX) 20 MG tablet Take 20 mg by mouth daily.    [provider]  triamcinolone (NASACORT AQ) 55  MCG/ACT AERO nasal inhaler Place 2 sprays into the nose daily as needed (for allergies.).    [provider]    Family History Family History  Problem Relation Age of Onset  . Diabetes Mother        CVA - 07/06/16  . Heart disease Mother   . Heart disease Father        Cancer of eye, Multiple Mylemona  . Colon cancer Neg Hx   . Esophageal cancer Neg Hx   . Stomach cancer Neg Hx   . Rectal cancer Neg Hx     Social History Social History  Substance Use Topics  .  Smoking status: Never Smoker  . Smokeless tobacco: Never Used  . Alcohol use 1.2 oz/week    2 Glasses of wine per week     Comment: red wine...  1/3 GLASS at dinner     Allergies   Bee venom; Ibuprofen; Cephalexin; Penicillins; and Sulfonamide derivatives   Review of Systems Review of Systems  Constitutional: Positive for chills and fatigue. Negative for fever.  Cardiovascular: Positive for chest pain (substernal).  Gastrointestinal: Positive for diarrhea, nausea and vomiting. Negative for abdominal pain and rectal pain.  Neurological: Positive for headaches. Negative for dizziness, weakness and numbness.  All other systems reviewed and are negative.    Physical Exam Updated Vital Signs BP (!) 120/55 (BP Location: Left Arm)   Pulse (!) 58   Temp 98.1 F (36.7 C) (Oral)   Resp 17   Ht 5\' 3"  (1.6 m)   Wt 147 lb (66.7 kg)   SpO2 97%   BMI 26.04 kg/m   Physical Exam  Constitutional: She is oriented to person, place, and time. She appears well-developed and well-nourished.  HENT:  Head: Normocephalic and atraumatic.  Airway is patent. Mucous membranes are moist.   Eyes: EOM are normal. Pupils are equal, round, and reactive to light.  Neck: Normal range of motion. Neck supple.  Cardiovascular: Normal rate and regular rhythm.   Pulmonary/Chest: Effort normal and breath sounds normal.  Symmetrical rise and fall of the chest.   Abdominal: Soft. Bowel sounds are normal. There is no tenderness.  There is no rebound.  Diffuse soreness of the abdomen. No splenomegaly or hepatomegaly.   Musculoskeletal: Normal range of motion.  Full range of motion of the cervical spine. No pitting edema.   Lymphadenopathy:    She has no cervical adenopathy.  Neurological: She is alert and oriented to person, place, and time.  Skin: Skin is warm and dry. No rash noted.  Psychiatric: She has a normal mood and affect.  Nursing note and vitals reviewed.    ED Treatments / Results  DIAGNOSTIC STUDIES: Oxygen Saturation is 97% on RA, normal by my interpretation.    COORDINATION OF CARE: 4:34 PM Discussed treatment plan with pt at bedside and pt agreed to plan, which includes lab work.   Labs (all labs ordered are listed, but only abnormal results are displayed) Labs Reviewed  COMPREHENSIVE METABOLIC PANEL - Abnormal; Notable for the following:       Result Value   Sodium 132 (*)    Potassium 3.3 (*)    Chloride 100 (*)    Calcium 7.1 (*)    Total Protein 6.2 (*)    Albumin 3.4 (*)    All other components within normal limits  LIPASE, BLOOD  CBC  URINALYSIS, ROUTINE W REFLEX MICROSCOPIC    EKG  EKG Interpretation None       Radiology No results found.  Procedures Procedures (including critical care time)  Medications Ordered in ED Medications - No data to display   Initial Impression / Assessment and Plan / ED Course  I have reviewed the triage vital signs and the nursing notes.  Pertinent labs & imaging results that were available during my care of the patient were reviewed by me and considered in my medical decision making (see chart for details).    Final Clinical Impressions(s) / ED Diagnoses   MDM: Pt has been sick for most of the week with chills, emesis, headache, diarrhea, feeling weak, fatigued. Pt has been treated with doxycycline. Pt  Has not been able to keep the medication down consistently. Both pt and husband have been exposed to tick bites. Will obtain  rocky mounted fever titers, orthostatic blood pressure checks, chemistries, and stool for C. Diff.  Case discussed with Dr Tomi Bamberger.  The urinalysis shows a clear yellow specimen with a specific gravity of 1.006, there is no evidence for urinary tract infection or kidney stone. The lipase is normal at 14. The sodium is low at 132, the potassium has dropped from the previous study 2 days ago to 3.3, the CO2 is normal at 24, the renal function is normal, and the liver function is also normal. The albumin is slightly low at 3.4. The complete blood count is well within normal limits. Recheck. Patient states she's feeling much better. No nausea, no vomiting, no diarrhea since receiving IV Zofran. Will try IV doxycycline, and reassess. Patient sipping fluids, and eating crackers. 2050 - Pt reports a 2/10 pain/pressure in the mid chest. This occurred shortly after IV doxycycline started, and pt ate crackers and drank water. Extra gaseous bowel sounds noted. No sweats, and no changes in mental status.  EKG is non-acute. Pt feels better after pepcid.  At discharge I ambulated patent in the room and hall. No complaints. Pt to be d/c home. I suspect viral illness. I have ask pt to provide PCP with stool sample for c diff to complete workup. Pt has appointment tomorrow. Pt will return to the ED if any changes, and will hold doxycycline until seen by PCP. Final diagnoses:  Nausea vomiting and diarrhea  Viral illness    New Prescriptions New Prescriptions   No medications on file   **I personally performed the services described in this documentation, which was scribed in my presence. The recorded information has been reviewed and is accurate.Lily Kocher, PA-C 12/01/16 1655    Dorie Rank, MD 12/01/16 [phone]

## 2016-11-30 DIAGNOSIS — Z6826 Body mass index (BMI) 26.0-26.9, adult: Secondary | ICD-10-CM | POA: Diagnosis not present

## 2016-11-30 DIAGNOSIS — K209 Esophagitis, unspecified: Secondary | ICD-10-CM | POA: Diagnosis not present

## 2016-11-30 DIAGNOSIS — R112 Nausea with vomiting, unspecified: Secondary | ICD-10-CM | POA: Diagnosis not present

## 2016-11-30 LAB — ROCKY MTN SPOTTED FVR ABS PNL(IGG+IGM)
RMSF IGM: 0.18 {index} (ref 0.00–0.89)
RMSF IgG: NEGATIVE

## 2016-12-02 LAB — ROCKY MTN SPOTTED FVR ABS PNL(IGG+IGM)
RMSF IGM: 0.22 {index} (ref 0.00–0.89)
RMSF IgG: NEGATIVE

## 2016-12-03 ENCOUNTER — Encounter: Payer: Medicare Other | Admitting: Gastroenterology

## 2016-12-03 NOTE — Addendum Note (Signed)
Addendum  created 12/03/16 0910 by Rica Koyanagi, MD   Sign clinical note

## 2016-12-03 NOTE — Addendum Note (Signed)
Addendum  created 12/03/16 0900 by Rica Koyanagi, MD   Sign clinical note

## 2016-12-13 ENCOUNTER — Telehealth: Payer: Self-pay | Admitting: Oncology

## 2016-12-13 NOTE — Telephone Encounter (Signed)
sw pt to confirm 6/21 appt at 1 pm per GM 6/11

## 2016-12-22 ENCOUNTER — Ambulatory Visit: Payer: Medicare Other | Admitting: Adult Health

## 2016-12-22 ENCOUNTER — Other Ambulatory Visit: Payer: Medicare Other

## 2016-12-22 NOTE — Progress Notes (Signed)
[facility]  Telephone:([phone]) [phone] Fax:(336) [phone]     ID: Maria Jackson DOB: 06/28/1946  MR#: [id number]  HER#:[id number]  Patient Care Team: Marton Redwood, MD as PCP - General (Internal Medicine) Terrance Mass, MD (Obstetrics and Gynecology) Jovita Kussmaul, MD as Consulting Physician (General Surgery) Magrinat, Virgie Dad, MD as Consulting Physician (Oncology) Kyung Rudd, MD as Consulting Physician (Radiation Oncology) Melina Schools, MD as Consulting Physician (Orthopedic Surgery) Marcial Pacas, MD as Consulting Physician (Neurology) Paulla Dolly Tamala Fothergill, DPM as Consulting Physician (Podiatry) Armbruster, Renelda Loma, MD as Consulting Physician (Gastroenterology) Scot Dock, NP OTHER MD:  CHIEF COMPLAINT: Ductal carcinoma in situ  CURRENT TREATMENT: tamoxifen   BREAST CANCER HISTORY: From the original intake note:  Maria Jackson had suspicious right breast calcifications noted on her November 2016 mammography and this was followed up 11/12/2015 with a right diagnostic mammogram which again showed a 0.6 cm area of grouped vascular calcifications which appeared benign. On 06/03/2016 she underwent bilateral diagnostic mammography with special attention to the right breast, with tomosynthesis. The breast density was category B. In the right breast lower inner quadrant there was a group of linear calcifications now measuring 2 cm. Biopsy was performed, and showed (06/09/2016, [id number] [id number]) ductal carcinoma in situ, high-grade, estrogen receptor 95% positive, with strong staining intensity, just her and receptor negative.  The patient's subsequent history is as detailed below   INTERVAL HISTORY:  Maria Jackson is here today for her evaluation of how well she is tolerating Tamoxifen.  She is doing well.  She is concerned about being on premarin cream with having the ER positive DCIS.  She wants to know if there are any other alternatives to the cream for her vaginal dryness.   She is also requesting her follow up schedule for Dr. Jana Jackson and Dr. Marlou Jackson to be worked out.     REVIEW OF SYSTEMS:  Maria Jackson is doing very well.  She denies any issues and a detailed ROS is non contributory.     PAST MEDICAL HISTORY: Past Medical History:  Diagnosis Date  . Allergy   . Arthritis    knees  . 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 Cancer ([place]) 11/05/11   SQUAMOS CELL CARCINOMA - LEFT CALF  . Cataract   . Chronic lower back pain   . Concussion   . Diplopia    left eye - when she looks down  . Head injury, closed, with concussion   . Insomnia    NO LONGER AN ISSUE  . 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 Osteopenia   . Osteoporosis   . Raynaud disease   . Seasonal allergies   . Spondylolisthesis    lumbar    PAST SURGICAL HISTORY: Past Surgical History:  Procedure Laterality Date  . APPENDECTOMY    . BREAST LUMPECTOMY WITH RADIOACTIVE SEED LOCALIZATION Right 07/14/2016   Procedure: RIGHT BREAST LUMPECTOMY WITH RADIOACTIVE SEED LOCALIZATION;  Surgeon: Autumn Messing III, MD;  Location: [facility];  Service: General;  Laterality: Right;  . CHOLECYSTECTOMY    . COLONOSCOPY    . ENDOSCOPIC VEIN LASER TREATMENT     calf riht and left  . FOOT NEUROMA SURGERY     bilateral  . JOINT REPLACEMENT    . TONSILLECTOMY    . TOTAL KNEE ARTHROPLASTY Left 04/27/2013   Procedure: LEFT TOTAL KNEE ARTHROPLASTY;  Surgeon: Gearlean Alf, MD;  [facility];  Service: Orthopedics;  Laterality: Left;  . TOTAL KNEE ARTHROPLASTY Right 08/27/2013   Procedure: RIGHT TOTAL KNEE ARTHROPLASTY;  Surgeon: Gearlean Alf, MD;  [facility];  Service: Orthopedics;  Laterality: Right;  . TUBAL LIGATION    . VAGINAL HYSTERECTOMY     tah/bso  . WISDOM TOOTH EXTRACTION      FAMILY HISTORY Family History  Problem Relation Age of Onset  . Diabetes Mother        CVA - 07/06/16  . Heart disease Mother   . Heart disease Father         Cancer of eye, Multiple Mylemona  . Colon cancer Neg Hx   . Esophageal cancer Neg Hx   . Stomach cancer Neg Hx   . Rectal cancer Neg Hx   The patient's father died at the age of 68 with multiple myeloma. The patient's mother has a history of melanoma. She is 74 years old as of December 2017. The patient has one brother, no sisters. Her brother has a history of melanoma resection 2. There is no history of breast or ovarian cancer in the family to the patient's knowledge  GYNECOLOGIC HISTORY:  No LMP recorded. Patient has had a hysterectomy. Menarche age 60, first live birth age 22, the patient is Maria Jackson P2. 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 At home it's she and her husband [name] who is retired from Engineer, technical sales. Daughter Rocky Morel lives in [place] and is a Energy manager. Son [name] lives in [place] and he is a Marine scientist. The patient has a total of 4 grandchildren with one on the way    ADVANCED DIRECTIVES: In place including a living will   HEALTH MAINTENANCE: Social History  Substance Use Topics  . Smoking status: Never Smoker  . Smokeless tobacco: Never Used  . Alcohol use 1.2 oz/week    2 Glasses of wine per week     Comment: red wine...  1/3 GLASS at dinner     Colonoscopy:05/20/2011/Repeat being considered later this year   PAP:  Bone density: 06/03/2016 at [facility], T score -2.5   Allergies  Allergen Reactions  . Bee Venom Itching, Swelling and Other (See Comments)    SWELLING REACTION UNSPECIFIED  REDNESS ITCHING  . Ibuprofen Other (See Comments)    Mouth ulcers  . Cephalexin     UNSPECIFIED REACTION   . Penicillins     Has patient had a PCN reaction causing immediate rash, facial/tongue/throat  swelling, SOB or lightheadedness with hypotension:unsure Has patient had a PCN reaction causing severe rash involving mucus membranes or skin necrosis:unsure Has patient had a PCN reaction that required hospitalization:No Has patient had a PCN reaction occurring within the last 10 years:No Reaction unknown If all of the above answers are "NO", then may proceed with Cephalosporin use.   . Sulfonamide Derivatives Rash    Current Outpatient Prescriptions  Medication Sig Dispense Refill  . aspirin 81 MG tablet Take 81 mg by mouth daily.    Marland Kitchen azithromycin (ZITHROMAX) 500 MG tablet Take 500 mg by mouth as directed. 500 mg by mouth 1 hour prior to dental work.    . cholecalciferol (VITAMIN D) 1000 units tablet Take 2,000 Units by mouth daily.    Marland Kitchen conjugated estrogens (PREMARIN) vaginal  cream Place 1 Applicatorful vaginally 2 (two) times a week. Taking .5 of the applicator twice a week    . cycloSPORINE (RESTASIS) 0.05 % ophthalmic emulsion Place 1 drop into both eyes 2 (two) times daily.    Marland Kitchen denosumab (PROLIA) 60 MG/ML SOLN injection Inject 60 mg into the skin every 6 (six) months. Administer in upper arm, thigh, or abdomen    . loratadine (CLARITIN) 10 MG tablet Take 10 mg by mouth as needed for allergies.    . Lutein 6 MG CAPS Take 6 mg by mouth 2 (two) times daily.     . Magnesium 250 MG TABS Take 250 mg by mouth 2 (two) times daily.     . Multiple Vitamin (MULTIVITAMIN WITH MINERALS) TABS tablet Take 1 tablet by mouth daily. Centrum Silver for Women    . nitrofurantoin, macrocrystal-monohydrate, (MACROBID) 100 MG capsule Take 100 mg by mouth daily as needed (for UTI prevention).    . Omega-3 Fatty Acids (FISH OIL) 1200 MG CAPS Take 1,200 mg by mouth daily.    . Probiotic Product (ALIGN) 4 MG CAPS Take 4 mg by mouth at bedtime.    . sodium chloride (OCEAN) 0.65 % nasal spray Place 1 spray into the nose 2 (two) times daily as needed for congestion.     . tamoxifen (NOLVADEX) 20 MG tablet Take  20 mg by mouth daily.    Marland Kitchen triamcinolone (NASACORT AQ) 55 MCG/ACT AERO nasal inhaler Place 2 sprays into the nose daily as needed (for allergies.).     No current facility-administered medications for this visit.     OBJECTIVE:   Vitals:   12/23/16 1236  BP: (!) 148/61  Pulse: 68  Resp: 18  Temp: 98.1 F (36.7 C)     Body mass index is 26.73 kg/m.    ECOG FS:0 - Asymptomatic GENERAL: Patient is a well appearing female in no acute distress HEENT:  Sclerae anicteric.  Oropharynx clear and moist. No ulcerations or evidence of oropharyngeal candidiasis. Neck is supple.  NODES:  No cervical, supraclavicular, or axillary lymphadenopathy palpated.  BREAST EXAM:  Left breast without any nodules, masses, skin or nipple changes, right breast is slightly dense, and lumpectomy site is well healed, no nodularity, mass, skin change noted.  LUNGS:  Clear to auscultation bilaterally.  No wheezes or rhonchi. HEART:  Regular rate and rhythm. No murmur appreciated. ABDOMEN:  Soft, nontender.  Positive, normoactive bowel sounds. No organomegaly palpated. MSK:  No focal spinal tenderness to palpation. Full range of motion bilaterally in the upper extremities. EXTREMITIES:  No peripheral edema.   SKIN:  Clear with no obvious rashes or skin changes. No nail dyscrasia. NEURO:  Nonfocal. Well oriented.  Appropriate affect.      LAB RESULTS:  CMP     Component Value Date/Time   NA 136 12/23/2016 1221   K 4.4 12/23/2016 1221   CL 100 (L) 11/29/2016 1159   CO2 27 12/23/2016 1221   GLUCOSE 86 12/23/2016 1221   BUN 15.5 12/23/2016 1221   CREATININE 0.7 12/23/2016 1221   CALCIUM 8.9 12/23/2016 1221   PROT 6.3 (L) 12/23/2016 1221   ALBUMIN 3.4 (L) 12/23/2016 1221   AST 22 12/23/2016 1221   ALT 17 12/23/2016 1221   ALKPHOS 36 (L) 12/23/2016 1221   BILITOT 0.54 12/23/2016 1221   GFRNONAA >60 11/29/2016 1159   GFRAA >60 11/29/2016 1159    INo results found for: SPEP, UPEP  Lab Results    Component Value Date  WBC 5.3 12/23/2016   NEUTROABS 3.3 12/23/2016   HGB 12.3 12/23/2016   HCT 36.8 12/23/2016   MCV 102.3 (H) 12/23/2016   PLT 305 12/23/2016      Chemistry      Component Value Date/Time   NA 136 12/23/2016 1221   K 4.4 12/23/2016 1221   CL 100 (L) 11/29/2016 1159   CO2 27 12/23/2016 1221   BUN 15.5 12/23/2016 1221   CREATININE 0.7 12/23/2016 1221      Component Value Date/Time   CALCIUM 8.9 12/23/2016 1221   ALKPHOS 36 (L) 12/23/2016 1221   AST 22 12/23/2016 1221   ALT 17 12/23/2016 1221   BILITOT 0.54 12/23/2016 1221       No results found for: LABCA2  No components found for: LABCA125  No results for input(s): INR in the last 168 hours.  Urinalysis    Component Value Date/Time   COLORURINE YELLOW 11/29/2016 1700   APPEARANCEUR CLEAR 11/29/2016 1700   LABSPEC 1.006 11/29/2016 1700   PHURINE 6.0 11/29/2016 1700   GLUCOSEU NEGATIVE 11/29/2016 1700   HGBUR SMALL (A) 11/29/2016 1700   BILIRUBINUR NEGATIVE 11/29/2016 1700   KETONESUR 5 (A) 11/29/2016 1700   PROTEINUR NEGATIVE 11/29/2016 1700   UROBILINOGEN 0.2 08/22/2013 1357   NITRITE NEGATIVE 11/29/2016 1700   LEUKOCYTESUR NEGATIVE 11/29/2016 1700     STUDIES: Right diagnostic mamm[facility] at [facility] 11/22/2015 showed probably benign vascular ossifications in the right breast. ELIGIBLE FOR AVAILABLE RESEARCH PROTOCOL: No  ASSESSMENT: 71 y.o. Maria Jackson, [place] woman status post right breast lower inner quadrant biopsy 06/03/2016 for ductal carcinoma in situ, high-grade, estrogen receptor positive, progesterone receptor negative  (1) right lumpectomy 07/14/2016 showed ductal carcinoma in situ, high-grade, measuring 0.8 cm, with negative margins  (2) adjuvant radiation 08/09/16-09/03/16  1) Right breast/ 42.5 Gy in 17 fractions  2) Right breast boost/ 7.5 Gy in 3 fractions  (3) to start tamoxifen mid March 2018  (a) bone density 06/03/2016 at [facility] shows osteoporosis with a T score of  -2.5  (b) prolia has started at Dr. Trena Platt office 08/13/2016  PLAN: Adesuwa is doing well today.  She will continue taking Tamoxifen daily and she is tolerating it well.  She and I reviewed her vaginal dryness and I did give her a handout on [employer].  She will look into that.  She and I reviewed the importance of healthy diet, exercise, avoiding tobacco and limiting alcohol.    Dispo:  SCP visit in 03/2017 Mammogram in 03/2017 Dr. Marlou Jackson appt in 06/2017 Dr. Jana Jackson f/u in 09/2017   A total of (30) minutes of face-to-face time was spent with this patient with greater than 50% of that time in counseling and care-coordination.   Scot Dock, NP   12/23/2016 8:59 PM Medical Oncology and Hematology [facility] [address] [place], [place] [address] Tel. [phone]    Fax. [phone]

## 2016-12-23 ENCOUNTER — Encounter: Payer: Self-pay | Admitting: Adult Health

## 2016-12-23 ENCOUNTER — Ambulatory Visit (HOSPITAL_BASED_OUTPATIENT_CLINIC_OR_DEPARTMENT_OTHER): Payer: Medicare Other | Admitting: Adult Health

## 2016-12-23 ENCOUNTER — Other Ambulatory Visit (HOSPITAL_BASED_OUTPATIENT_CLINIC_OR_DEPARTMENT_OTHER): Payer: Medicare Other

## 2016-12-23 VITALS — BP 148/61 | HR 68 | Temp 98.1°F | Resp 18 | Ht 63.0 in | Wt 150.9 lb

## 2016-12-23 DIAGNOSIS — M810 Age-related osteoporosis without current pathological fracture: Secondary | ICD-10-CM | POA: Diagnosis not present

## 2016-12-23 DIAGNOSIS — D0511 Intraductal carcinoma in situ of right breast: Secondary | ICD-10-CM | POA: Diagnosis not present

## 2016-12-23 DIAGNOSIS — C50311 Malignant neoplasm of lower-inner quadrant of right female breast: Secondary | ICD-10-CM

## 2016-12-23 DIAGNOSIS — Z79811 Long term (current) use of aromatase inhibitors: Secondary | ICD-10-CM | POA: Diagnosis not present

## 2016-12-23 DIAGNOSIS — Z170 Estrogen receptor positive status [ER+]: Secondary | ICD-10-CM

## 2016-12-23 LAB — COMPREHENSIVE METABOLIC PANEL
ALBUMIN: 3.4 g/dL — AB (ref 3.5–5.0)
ALT: 17 U/L (ref 0–55)
AST: 22 U/L (ref 5–34)
Alkaline Phosphatase: 36 U/L — ABNORMAL LOW (ref 40–150)
Anion Gap: 8 mEq/L (ref 3–11)
BILIRUBIN TOTAL: 0.54 mg/dL (ref 0.20–1.20)
BUN: 15.5 mg/dL (ref 7.0–26.0)
CO2: 27 mEq/L (ref 22–29)
CREATININE: 0.7 mg/dL (ref 0.6–1.1)
Calcium: 8.9 mg/dL (ref 8.4–10.4)
Chloride: 100 mEq/L (ref 98–109)
EGFR: 83 mL/min/{1.73_m2} — ABNORMAL LOW (ref >90)
GLUCOSE: 86 mg/dL (ref 70–140)
Potassium: 4.4 mEq/L (ref 3.5–5.1)
SODIUM: 136 meq/L (ref 136–145)
TOTAL PROTEIN: 6.3 g/dL — AB (ref 6.4–8.3)

## 2016-12-23 LAB — CBC WITH DIFFERENTIAL/PLATELET
BASO%: 0.3 % (ref 0.0–2.0)
BASOS ABS: 0 10*3/uL (ref 0.0–0.1)
EOS%: 0.8 % (ref 0.0–7.0)
Eosinophils Absolute: 0 10*3/uL (ref 0.0–0.5)
HEMATOCRIT: 36.8 % (ref 34.8–46.6)
HEMOGLOBIN: 12.3 g/dL (ref 11.6–15.9)
LYMPH#: 0.9 10*3/uL (ref 0.9–3.3)
LYMPH%: 17.1 % (ref 14.0–49.7)
MCH: 34.3 pg — AB (ref 25.1–34.0)
MCHC: 33.5 g/dL (ref 31.5–36.0)
MCV: 102.3 fL — ABNORMAL HIGH (ref 79.5–101.0)
MONO#: 1 10*3/uL — ABNORMAL HIGH (ref 0.1–0.9)
MONO%: 19.1 % — ABNORMAL HIGH (ref 0.0–14.0)
NEUT%: 62.7 % (ref 38.4–76.8)
NEUTROS ABS: 3.3 10*3/uL (ref 1.5–6.5)
Platelets: 305 10*3/uL (ref 145–400)
RBC: 3.59 10*6/uL — ABNORMAL LOW (ref 3.70–5.45)
RDW: 12.9 % (ref 11.2–14.5)
WBC: 5.3 10*3/uL (ref 3.9–10.3)

## 2017-01-02 HISTORY — PX: COLONOSCOPY: SHX174

## 2017-01-07 ENCOUNTER — Telehealth: Payer: Self-pay | Admitting: Gastroenterology

## 2017-01-07 NOTE — Telephone Encounter (Signed)
The pt wanted to confirm she can take her stool softener up to the day prior to her procedure.  I advised she can take it up to the day prior but will not need to take it the day prior or the day of the procedure.  Pt agreed and will call with any further concerns

## 2017-01-10 ENCOUNTER — Telehealth: Payer: Self-pay | Admitting: Gastroenterology

## 2017-01-11 NOTE — Telephone Encounter (Signed)
Patient had questions about the type of sedation that is used in the [facility].  She is notified that she will get Propofol. All questions answered.  She will call back for additional questions or concerns.

## 2017-01-11 NOTE — Telephone Encounter (Signed)
Left message for patient to call back  

## 2017-01-13 ENCOUNTER — Telehealth: Payer: Self-pay | Admitting: Gastroenterology

## 2017-01-13 ENCOUNTER — Ambulatory Visit: Payer: Medicare Other | Admitting: Gastroenterology

## 2017-01-13 VITALS — BP 140/65 | HR 71 | Temp 97.8°F | Ht 63.0 in | Wt 153.0 lb

## 2017-01-13 MED ORDER — FLEET ENEMA 7-19 GM/118ML RE ENEM
1.0000 | ENEMA | RECTAL | Status: AC
  Administered 2017-01-13: 1 via RECTAL

## 2017-01-13 MED ORDER — SODIUM CHLORIDE 0.9 % IV SOLN: 500.0000 mL | INTRAVENOUS | Status: DC

## 2017-01-13 NOTE — Telephone Encounter (Signed)
Patient calling back regarding this. She is requesting a callback within 30 minutes because she lives out of town.

## 2017-01-13 NOTE — Progress Notes (Signed)
Pt reported dark, brown liquid no formed stool to Yolonda Kida, [place].  Lafe Garin with Dr. Havery Moros and he ordered enema x1 and proceed with colonoscopy.  maw

## 2017-01-13 NOTE — Progress Notes (Signed)
I viewed dark, brown liquid in toilet.  Spoke with Dr. Havery Moros and he recommended to take miralax prep tonight and tomorrow am.  Pt is scheduled for colonoscopy with propofol 01-14-17 at 11:00.  Riki Sheer, LPN will go over miralax instructions with pt and her husband.  Dr. Havery Moros will come in and speak with pt also. maw

## 2017-01-13 NOTE — Telephone Encounter (Signed)
Returned patient's call and she states that she was unable to keep second prep down. She states that her stools are somewhat brown with no solids.  I advised her to come in as scheduled and we would reevaluate.  If needed, we will speak with Dr. Havery Moros to see if she will need an enema.  All questions and concerns were addressed.

## 2017-01-14 ENCOUNTER — Encounter: Payer: Self-pay | Admitting: Gastroenterology

## 2017-01-14 ENCOUNTER — Ambulatory Visit (AMBULATORY_SURGERY_CENTER): Payer: Medicare Other | Admitting: Gastroenterology

## 2017-01-14 VITALS — BP 156/78 | HR 67 | Temp 97.8°F | Resp 13 | Ht 63.0 in | Wt 153.0 lb

## 2017-01-14 DIAGNOSIS — K635 Polyp of colon: Secondary | ICD-10-CM

## 2017-01-14 DIAGNOSIS — Z1211 Encounter for screening for malignant neoplasm of colon: Secondary | ICD-10-CM | POA: Diagnosis present

## 2017-01-14 DIAGNOSIS — K623 Rectal prolapse: Secondary | ICD-10-CM

## 2017-01-14 DIAGNOSIS — K6289 Other specified diseases of anus and rectum: Secondary | ICD-10-CM | POA: Diagnosis not present

## 2017-01-14 DIAGNOSIS — D123 Benign neoplasm of transverse colon: Secondary | ICD-10-CM | POA: Diagnosis not present

## 2017-01-14 DIAGNOSIS — K621 Rectal polyp: Secondary | ICD-10-CM | POA: Diagnosis not present

## 2017-01-14 DIAGNOSIS — Z1212 Encounter for screening for malignant neoplasm of rectum: Secondary | ICD-10-CM

## 2017-01-14 MED ORDER — FLEET ENEMA 7-19 GM/118ML RE ENEM
1.0000 | ENEMA | Freq: Once | RECTAL | Status: AC
  Administered 2017-01-14: 1 via RECTAL

## 2017-01-14 MED ORDER — SODIUM CHLORIDE 0.9 % IV SOLN: 500.0000 mL | INTRAVENOUS | Status: DC

## 2017-01-14 NOTE — Patient Instructions (Signed)
YOU HAD AN ENDOSCOPIC PROCEDURE TODAY AT THE [facility]:   Refer to the procedure report that was given to you for any specific questions about what was found during the examination.  If the procedure report does not answer your questions, please call your gastroenterologist to clarify.  If you requested that your care partner not be given the details of your procedure findings, then the procedure report has been included in a sealed envelope for you to review at your convenience later.  YOU SHOULD EXPECT: Some feelings of bloating in the abdomen. Passage of more gas than usual.  Walking can help get rid of the air that was put into your GI tract during the procedure and reduce the bloating. If you had a lower endoscopy (such as a colonoscopy or flexible sigmoidoscopy) you may notice spotting of blood in your stool or on the toilet paper. If you underwent a bowel prep for your procedure, you may not have a normal bowel movement for a few days.  Please Note:  You might notice some irritation and congestion in your nose or some drainage.  This is from the oxygen used during your procedure.  There is no need for concern and it should clear up in a day or so.  SYMPTOMS TO REPORT IMMEDIATELY:   Following lower endoscopy (colonoscopy or flexible sigmoidoscopy):  Excessive amounts of blood in the stool  Significant tenderness or worsening of abdominal pains  Swelling of the abdomen that is new, acute  Fever of 100F or higher  For urgent or emergent issues, a gastroenterologist can be reached at any hour by calling [phone].   DIET:  We do recommend a small meal at first, but then you may proceed to your regular diet.  Drink plenty of fluids but you should avoid alcoholic beverages for 24 hours.  MEDICATIONS: Continue present medications. No Ibuprofen, Naproxen, or other non-steroidal anti-inflammatory drugs for 2 weeks after polyp removal.  Please see handouts given to you by your  recovery nurse.  ACTIVITY:  You should plan to take it easy for the rest of today and you should NOT DRIVE or use heavy machinery until tomorrow (because of the sedation medicines used during the test).    FOLLOW UP: Our staff will call the number listed on your records the next business day following your procedure to check on you and address any questions or concerns that you may have regarding the information given to you following your procedure. If we do not reach you, we will leave a message.  However, if you are feeling well and you are not experiencing any problems, there is no need to return our call.  We will assume that you have returned to your regular daily activities without incident.  If any biopsies were taken you will be contacted by phone or by letter within the next 1-3 weeks.  Please call us at [phone] if you have not heard about the biopsies in 3 weeks.   Thank you for allowing us to provide for your healthcare needs today.   SIGNATURES/CONFIDENTIALITY: You and/or your care partner have signed paperwork which will be entered into your electronic medical record.  These signatures attest to the fact that that the information above on your After Visit Summary has been reviewed and is understood.  Full responsibility of the confidentiality of this discharge information lies with you and/or your care-partner. 

## 2017-01-14 NOTE — Progress Notes (Signed)
Called to room to assist during endoscopic procedure.  Patient ID and intended procedure confirmed with present staff. Received instructions for my participation in the procedure from the performing physician.  

## 2017-01-14 NOTE — Progress Notes (Signed)
Patient went to restroom on arrival. Yellow sticky mucous resulting. Patient reporting she had particulate in results at home. Informed Dr. Wilma Flavin. Fleet enema ordered and given at 1015. Results- gold colored results.

## 2017-01-14 NOTE — Progress Notes (Signed)
Alert and oriented x3, pleased with MAC, report to RN Sarah 

## 2017-01-14 NOTE — Op Note (Signed)
[facility] Patient Name: Maria Jackson Procedure Date: 01/14/2017 10:57 AM MRN: [id number] Endoscopist: Remo Lipps P. Zakaria Fromer MD, MD Age: 71 Referring MD:  Date of Birth: Sep 19, 1945 Gender: Female Account #: [id number] Procedure:                Colonoscopy Indications:              Screening for colorectal malignant neoplasm Medicines:                Monitored Anesthesia Care Procedure:                Pre-Anesthesia Assessment:                           - Prior to the procedure, a History and Physical                            was performed, and patient medications and                            allergies were reviewed. The patient's tolerance of                            previous anesthesia was also reviewed. The risks                            and benefits of the procedure and the sedation                            options and risks were discussed with the patient.                            All questions were answered, and informed consent                            was obtained. Prior Anticoagulants: The patient has                            taken aspirin, last dose was 1 day prior to                            procedure. ASA Grade Assessment: II - A patient                            with mild systemic disease. After reviewing the                            risks and benefits, the patient was deemed in                            satisfactory condition to undergo the procedure.                           After obtaining informed consent, the colonoscope  was passed under direct vision. Throughout the                            procedure, the patient's blood pressure, pulse, and                            oxygen saturations were monitored continuously. The                            Colonoscope was introduced through the anus and                            advanced to the the cecum, identified by                            appendiceal  orifice and ileocecal valve. The                            colonoscopy was technically difficult and complex                            due to significant looping. The patient tolerated                            the procedure well. The quality of the bowel                            preparation was fair. The ileocecal valve,                            appendiceal orifice, and rectum were photographed. Scope In: 11:07:33 AM Scope Out: 11:44:15 AM Scope Withdrawal Time: 0 hours 27 minutes 8 seconds  Total Procedure Duration: 0 hours 36 minutes 42 seconds  Findings:                 The perianal and digital rectal examinations were                            normal.                           Two flat polyps were found in the hepatic flexure.                            The polyps were 5 to 6 mm in size. These polyps                            were removed with a cold snare. Resection and                            retrieval were complete.                           A 12 to 15 mm polyp was found in the proximal  transverse colon. The polyp was flat. The polyp was                            removed with a cold snare. Resection and retrieval                            were complete.                           A localized area of severly erythematous mucosa was                            found in the rectum. I suspect this represents                            changes from rectal prolapse given history patient                            provided. Biopsies were taken with a cold forceps                            for histology to ensure no adenomatous change.                           The colon was tortuous with looping, abdominal                            pressure needed for examination of the right colon.                           A few medium-mouthed diverticula were found in the                            sigmoid colon.                           The prep was only  fair during insertion - several                            minutes spent lavaging the colon after which                            adequate views for screening werer obtained. The                            exam was otherwise without abnormality. Complications:            No immediate complications. Estimated blood loss:                            Minimal. Estimated Blood Loss:     Estimated blood loss was minimal. Impression:               - Preparation of the colon was fair intially,  adequate for screening purposes following lavage.                           - Two 5 to 6 mm polyps at the hepatic flexure,                            removed with a cold snare. Resected and retrieved.                           - One 12 to 15 mm polyp in the proximal transverse                            colon, removed with a cold snare. Resected and                            retrieved.                           - Erythematous mucosa in the rectum, suspect rectal                            prolapse changes. Biopsied.                           - Tortuous colon.                           - Diverticulosis in the sigmoid colon.                           - The examination was otherwise normal. Recommendation:           - Patient has a contact number available for                            emergencies. The signs and symptoms of potential                            delayed complications were discussed with the                            patient. Return to normal activities tomorrow.                            Written discharge instructions were provided to the                            patient.                           - Resume previous diet.                           - Continue present medications.                           -  Await pathology results.                           - Repeat colonoscopy is recommended for                            surveillance. The colonoscopy date will  be                            determined after pathology results from today's                            exam become available for review.                           - No ibuprofen, naproxen, or other non-steroidal                            anti-inflammatory drugs for 2 weeks after polyp                            removal.                           - Consideration for referral to pelvic floor PT for                            rectal prolapse if biopsies show no adenomatous                            change Remo Lipps P. Tanji Storrs MD, MD 01/14/2017 11:52:22 AM This report has been signed electronically.

## 2017-01-17 ENCOUNTER — Telehealth: Payer: Self-pay | Admitting: *Deleted

## 2017-01-17 NOTE — Telephone Encounter (Signed)
  Follow up Call-  Call back number 01/14/2017  Post procedure Call Back phone  # [phone]  Permission to leave phone message Yes  Some recent data might be hidden     Patient questions:  Do you have a fever, pain , or abdominal swelling? No. Pain Score  0 *  Have you tolerated food without any problems? No. Gas issues; not much of an appetite  Have you been able to return to your normal activities? Yes.    Do you have any questions about your discharge instructions: Diet   No. Medications  No. Follow up visit  No.  Do you have questions or concerns about your Care? Yes.    Actions: * If pain score is 4 or above: No action needed, pain <4.

## 2017-01-19 DIAGNOSIS — H04123 Dry eye syndrome of bilateral lacrimal glands: Secondary | ICD-10-CM | POA: Diagnosis not present

## 2017-01-19 DIAGNOSIS — R51 Headache: Secondary | ICD-10-CM | POA: Diagnosis not present

## 2017-01-19 DIAGNOSIS — H35363 Drusen (degenerative) of macula, bilateral: Secondary | ICD-10-CM | POA: Diagnosis not present

## 2017-01-19 DIAGNOSIS — H40013 Open angle with borderline findings, low risk, bilateral: Secondary | ICD-10-CM | POA: Diagnosis not present

## 2017-01-20 ENCOUNTER — Telehealth: Payer: Self-pay | Admitting: Gastroenterology

## 2017-01-20 NOTE — Telephone Encounter (Signed)
Spoke to patient about Citrucel fiber supplement to help provide bulk to her stool in order to have a more complete bowel movement. She has been trying to increase fiber within her diet and will also use the Citrucel.

## 2017-01-21 ENCOUNTER — Other Ambulatory Visit: Payer: Self-pay

## 2017-01-21 DIAGNOSIS — K623 Rectal prolapse: Secondary | ICD-10-CM

## 2017-01-24 ENCOUNTER — Ambulatory Visit: Payer: Medicare Other | Attending: Gastroenterology | Admitting: Physical Therapy

## 2017-01-24 ENCOUNTER — Encounter: Payer: Self-pay | Admitting: Physical Therapy

## 2017-01-24 DIAGNOSIS — R279 Unspecified lack of coordination: Secondary | ICD-10-CM | POA: Insufficient documentation

## 2017-01-24 DIAGNOSIS — M6281 Muscle weakness (generalized): Secondary | ICD-10-CM | POA: Diagnosis not present

## 2017-01-24 DIAGNOSIS — M62838 Other muscle spasm: Secondary | ICD-10-CM

## 2017-01-24 NOTE — Therapy (Signed)
[facility] [address], [place], [place], [address] Phone: [phone]   Fax:  [phone]  Physical Therapy Evaluation  Patient Details  Name: Maria Jackson MRN: [id number] Date of Birth: 07-Aug-1945 Referring Provider: Dr. Renelda Loma Armbruster  Encounter Date: 01/24/2017      PT End of Session - 01/24/17 1553    Visit Number 1   Number of Visits 10   Date for PT Re-Evaluation 03/21/17   Authorization Type medicare g-code 10th visit   PT Start Time 1194   PT Stop Time 1545   PT Time Calculation (min) 60 min   Activity Tolerance Patient tolerated treatment well   Behavior During Therapy [facility] for tasks assessed/performed      Past Medical History:  Diagnosis Date  . Allergy   . Arthritis    knees  . 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 Cancer ([place]) 11/05/11   SQUAMOS CELL CARCINOMA - LEFT CALF  . Cataract   . Chronic lower back pain   . Concussion   . Diplopia    left eye - when she looks down  . Head injury, closed, with concussion   . Insomnia    NO LONGER AN ISSUE  . 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 Osteopenia   . Osteoporosis   . Raynaud disease   . Seasonal allergies   . Spondylolisthesis    lumbar    Past Surgical History:  Procedure Laterality Date  . APPENDECTOMY    . BREAST LUMPECTOMY WITH RADIOACTIVE SEED LOCALIZATION Right 07/14/2016   Procedure: RIGHT BREAST LUMPECTOMY WITH RADIOACTIVE SEED LOCALIZATION;  Surgeon: Autumn Messing III, MD;  Location: [facility];  Service: General;  Laterality: Right;  . CHOLECYSTECTOMY    . COLONOSCOPY    . ENDOSCOPIC VEIN LASER TREATMENT     calf riht and left  . FOOT NEUROMA SURGERY     bilateral  . JOINT REPLACEMENT    . TONSILLECTOMY    . TOTAL KNEE ARTHROPLASTY Left 04/27/2013   Procedure: LEFT TOTAL KNEE ARTHROPLASTY;  Surgeon: Gearlean Alf, MD;  Location: [facility];  Service:  Orthopedics;  Laterality: Left;  . TOTAL KNEE ARTHROPLASTY Right 08/27/2013   Procedure: RIGHT TOTAL KNEE ARTHROPLASTY;  Surgeon: Gearlean Alf, MD;  Location: [facility];  Service: Orthopedics;  Laterality: Right;  . TUBAL LIGATION    . VAGINAL HYSTERECTOMY     tah/bso  . WISDOM TOOTH EXTRACTION      There were no vitals filed for this visit.       Subjective Assessment - 01/24/17 1453    Subjective Patient reports she has hemmorroids.  Felt she had the rectal prolapse for the past 6 months.  Felt it prolpase outside the anal area. When it happens she has to physically push it back in.     Pertinent History s/p breast cancer on 06/09/2016 was estrogen positive with radiation for treatment   Patient Stated Goals work on rectal prolpase, decreased urinary leakage with cough or laugh, sneezing; get back on exercise program            [facility] PT Assessment - 01/24/17 0001      Assessment   Medical Diagnosis K62.3 rectal prolapse   Referring Provider Dr. Renelda Loma Armbruster   Onset Date/Surgical Date 08/05/16   Prior Therapy None for pelvic floor     Precautions   Precautions Other (comment)  Precaution Comments breast cancer      Restrictions   Weight Bearing Restrictions No     Balance Screen   Has the patient fallen in the past 6 months No   Has the patient had a decrease in activity level because of a fear of falling?  No   Is the patient reluctant to leave their home because of a fear of falling?  No     Home Ecologist residence     Prior Function   Level of Independence Independent   Vocation Retired   Leisure used to exercise at [employer]   Overall Cognitive Status Within Functional Limits for tasks assessed     Observation/Other Assessments   Focus on Therapeutic Outcomes (FOTO)  functional limitation as per therapist discretion is 30% limited  goal is 20% limitation     Posture/Postural Control    Posture/Postural Control No significant limitations     ROM / Strength   AROM / PROM / Strength AROM;PROM;Strength     AROM   Overall AROM  Within functional limits for tasks performed     Strength   Overall Strength Comments standing on one leg was difficult   Right Hip Flexion 4/5   Right Hip Extension 4/5   Right Hip ABduction 4/5   Left Hip Flexion 4/5   Left Hip Extension 4/5   Left Hip ABduction 4/5     Palpation   SI assessment  pelvis in correct alignment     Transfers   Transfers Not assessed     Ambulation/Gait   Ambulation/Gait No            Objective measurements completed on examination: See above findings.        Pelvic Floor Special Questions - 01/24/17 0001    Currently Sexually Active Yes   Is this Painful Yes   Marinoff Scale discomfort that does not affect completion  due to dryness   Urinary Leakage Yes   Pad use 1 light day   Activities that cause leaking Sneezing;Coughing;Laughing   Fecal incontinence Yes  skid mark daily to every other day   Pelvic Floor Internal Exam Patient confirms identification and approves PT to assess muscle integrity   Exam Type Rectal   Palpation decreased contraction of puborectalis and anterior pelvic floor muscles   Strength weak squeeze, no lift   Tone low tone                  PT Education - 01/24/17 1550    Education provided Yes   Education Details education on pelvic floor strength in sidely, vaginal moisturizers and lubricants   Person(s) Educated Patient   Methods Explanation;Demonstration;Verbal cues;Handout   Comprehension Returned demonstration;Verbalized understanding          PT Short Term Goals - 01/24/17 1606      PT SHORT TERM GOAL #1   Title independent with initial HEP   Time 4   Period Weeks   Status New   Target Date 02/21/17     PT SHORT TERM GOAL #2   Title pelvic floor strength is >/= 3/5 to reduce fecal incontinence    Time 4   Period Weeks   Status New    Target Date 02/21/17     PT SHORT TERM GOAL #3   Title understand how to manage vaginal dryness with lubricants and moisturizers   Time 4   Period Weeks  Status New   Target Date 02/21/17           PT Long Term Goals - 01/24/17 1608      PT LONG TERM GOAL #1   Title independent with HEP and understand how to progress herself   Time 8   Period Weeks   Status New   Target Date 03/21/17     PT LONG TERM GOAL #2   Title reports no rectal prolapse since intial evaluation due to pelvic floor strength >/= 4/5   Time 8   Period Weeks   Status New   Target Date 03/21/17     PT LONG TERM GOAL #3   Title fecal leakage decreased >/= 75% due to increased strength and tone of the pelvic floor   Time 8   Period Weeks   Status New   Target Date 03/21/17     PT LONG TERM GOAL #4   Title understand how to left and exercise with pelvic floor contraction and no bearing down due to no valsalva manuever   Time 8   Period Weeks   Status New   Target Date 03/21/17                Plan - 01/24/17 1554    Clinical Impression Statement Patient is a 71 year old female with rectal prolapse for the past 6 months after she sits on the commode for a long period of time or have to have frequent bowel movements.  Patient will push the prolase back into the anal canal.  Patient reports she wears a light day pad due to fecal leakage.  Patient will leak urine when she coughs, laughs, or sneezes.  Marinoff score is 1/3 due to discomfort from vaginal dryness. Pelvic floor strength in anal region is 2/5 with decreased contraction of puborectalis and anterior pelvis.    History and Personal Factors relevant to plan of care: s/p breast cancer  estrogen positive 06/09/2016 with radiation treatment   Clinical Presentation Stable   Clinical Presentation due to: stable condition   Clinical Decision Making Low   Rehab Potential Excellent   PT Frequency 1x / week   PT Duration 8 weeks   PT  Treatment/Interventions Therapeutic activities;Therapeutic exercise;Neuromuscular re-education;Patient/family education;Manual techniques;Biofeedback   PT Next Visit Plan abdominal massage; no valslva manuever, toileting technique, hip strength; review vaginal moisturizers   PT Home Exercise Plan progress as needed   Consulted and Agree with Plan of Care Patient      Patient will benefit from skilled therapeutic intervention in order to improve the following deficits and impairments:  Decreased coordination, Increased fascial restricitons, Impaired tone, Decreased endurance, Increased muscle spasms, Decreased activity tolerance, Decreased mobility, Decreased strength  Visit Diagnosis: Muscle weakness (generalized) - Plan: PT plan of care cert/re-cert  Unspecified lack of coordination - Plan: PT plan of care cert/re-cert  Other muscle spasm - Plan: PT plan of care cert/re-cert      G-Codes - 16/10/96 1549    Functional Assessment Tool Used (Outpatient Only) therapist discretion due to urinary and fecal leakage 30% limitation  goal is 20% limitation   Functional Limitation Other PT primary   Other PT Primary Current Status (E4540) At least 40 percent but less than 60 percent impaired, limited or restricted   Other PT Primary Goal Status (J8119) At least 20 percent but less than 40 percent impaired, limited or restricted       Problem List Patient Active Problem List   Diagnosis  Date Noted  . Osteoporosis 06/20/2016  . Malignant neoplasm of lower-inner quadrant of right breast of female, estrogen receptor positive ([place]) 06/11/2016  . Cephalalgia 03/04/2016  . Diplopia 09/02/2015  . Neck pain on right side 09/02/2015  . Balance disorder 06/05/2015  . Neck pain 05/15/2015  . Headache 05/15/2015  . Postoperative anemia due to acute blood loss 08/28/2013  . OA (osteoarthritis) of knee 04/27/2013  . 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 Vaginal atrophy 05/25/2012  .  CONSTIPATION 08/27/2009  . ULCERATION OF INTESTINE 08/27/2009    Earlie Counts, PT 01/24/17 4:14 PM   [facility] [address], [place], [place], [address] Phone: [phone]   Fax:  [phone]  Name: Maria Jackson MRN: [id number] Date of Birth: Aug 28, 1945

## 2017-01-24 NOTE — Patient Instructions (Addendum)
Quick Contraction: Gravity Eliminated (Side-Lying)    Lie on left side, hips and knees slightly bent. Quickly squeeze then fully relax pelvic floor. Perform _1__ sets of _5__. Rest for _1__ seconds between sets. Do _3__ times a day.   Copyright  VHI. All rights reserved.  Slow Contraction: Gravity Eliminated (Side-Lying)    Lie on left side, hips and knees slightly bent. Slowly squeeze pelvic floor for _5__ seconds. Rest for _5__ seconds. Repeat _10__ times. Do __3_ times a day.  Do not contract the buttocks.  Copyright  VHI. All rights reserved.   After a bowel movement contract pelvic floor 5 times.    Moisturizers . They are used in the vagina to hydrate the mucous membrane that make up the vaginal canal. . Designed to keep a more normal acid balance (ph) . Once placed in the vagina, it will last between two to three days.  . Use 2-3 times per week at bedtime and last longer than 60 min. . Ingredients to avoid is glycerin and fragrance, can increase chance of infection . Should not be used just before sex due to causing irritation . Most are gels administered either in a tampon-shaped applicator or as a vaginal suppository. They are non-hormonal.   Types of Moisturizers . Vitamin E from earth fare in purple box . Samul Dada- drug store . Vitamin E vaginal suppositories- Whole foods . Moist Again . Coconut oil- can break down condoms . Michail Jewels . [place] . V-magic - cream external for the vulva area . Yes moisturizer- amazon  Things to avoid in the vaginal area . Do not use things to irritate the vulvar area . No lotions just specialized creams for the vulva area- Neogyn, V-magic, [place] . No soaps; can use Aveeno or Calendula cleanser if needed. Must be gentle . No deodorants . No douches . Good to sleep without underwear to let the vaginal area to air out . No scrubbing: spread the lips to let warm water rinse  over labias and pat dry   Lubrication . Used for intercourse to reduce friction . Avoid ones that have glycerin, warming gels, tingling gels, icing or cooling gel, scented . Avoid parabens due to a preservative similar to female sex hormone . May need to be reapplied once or several times during sexual activity . Can be applied to both partners genitals prior to vaginal penetration to minimize friction or irritation . Prevent irritation and mucosal tears that cause post coital pain and increased the risk of vaginal and urinary tract infections . Oil-based lubricants cannot be used with condoms due to breaking them down.  Least likely to irritate vaginal tissue.  . Plant based-lubes are safe . Silicone-based lubrication are thicker and last long and used for post-menopausal women  Vaginal Lubricators Here is a list of some suggested lubricators you can use for intercourse. Use the most hypoallergenic product.  You can place on you or your partner.   Astroglide natural in green box  K-Y Intrique- silicone base  Good Clean Love -CVS,Target, Walmart, [employer] (water based)  Slippery Stuff  Sylk, Sliquid Natural H2O ( good  if frequent UTI's)  Blossom Organics (www.blossom-organics.com)  Samul Dada, Coconut oil  PJur Woman Nude- water based lubricant, [place], good for cancer patients with radiation  Yes lubricant- [place] . Wet Platinum-Silicone, Target, Walgreens Things to avoid in lubricants are glycerin, warming gels, tingling gels,  icing or cooling  gels, and scented gels.  Also avoid Vaseline. KY jelly, Replens, and Astroglide kills good bacteria(lactobacilli)  Things to avoid in the vaginal area . Do not use things to irritate the vulvar area . No lotions . No soaps; can use Aveeno or Calendula cleanser if needed. Must be gentle . No deodorants . No douches . Good to sleep without underwear to let the vaginal area to air  out . No scrubbing: spread the lips to let warm water rinse over labias and pat dry   [facility] [address] [place], [place] [address] Phone # [phone] Fax [phone]

## 2017-02-09 ENCOUNTER — Ambulatory Visit: Payer: Medicare Other | Attending: Gastroenterology | Admitting: Physical Therapy

## 2017-02-09 ENCOUNTER — Encounter: Payer: Self-pay | Admitting: Physical Therapy

## 2017-02-09 DIAGNOSIS — M6281 Muscle weakness (generalized): Secondary | ICD-10-CM

## 2017-02-09 DIAGNOSIS — R279 Unspecified lack of coordination: Secondary | ICD-10-CM | POA: Diagnosis present

## 2017-02-09 DIAGNOSIS — M62838 Other muscle spasm: Secondary | ICD-10-CM | POA: Diagnosis present

## 2017-02-10 DIAGNOSIS — M810 Age-related osteoporosis without current pathological fracture: Secondary | ICD-10-CM | POA: Diagnosis not present

## 2017-02-10 NOTE — Patient Instructions (Signed)
About Abdominal Massage  Abdominal massage, also called external colon massage, is a self-treatment circular massage technique that can reduce and eliminate gas and ease constipation. The colon naturally contracts in waves in a clockwise direction starting from inside the right hip, moving up toward the ribs, across the belly, and down inside the left hip.  When you perform circular abdominal massage, you help stimulate your colon's normal wave pattern of movement called peristalsis.  It is most beneficial when done after eating.  Positioning You can practice abdominal massage with oil while lying down, or in the shower with soap.  Some people find that it is just as effective to do the massage through clothing while sitting or standing.  How to Massage Start by placing your finger tips or knuckles on your right side, just inside your hip bone.  . Make small circular movements while you move upward toward your rib cage.   . Once you reach the bottom right side of your rib cage, take your circular movements across to the left side of the bottom of your rib cage.  . Next, move downward until you reach the inside of your left hip bone.  This is the path your feces travel in your colon. . Continue to perform your abdominal massage in this pattern for 10 minutes each day.     You can apply as much pressure as is comfortable in your massage.  Start gently and build pressure as you continue to practice.  Notice any areas of pain as you massage; areas of slight pain may be relieved as you massage, but if you have areas of significant or intense pain, consult with your healthcare provider.  Other Considerations . General physical activity including bending and stretching can have a beneficial massage-like effect on the colon.  Deep breathing can also stimulate the colon because breathing deeply activates the same nervous system that supplies the colon.   . Abdominal massage should always be used in  combination with a bowel-conscious diet that is high in the proper type of fiber for you, fluids (primarily water), and a regular exercise program. Toileting Techniques for Bowel Movements (Defecation) Using your belly (abdomen) and pelvic floor muscles to have a bowel movement is usually instinctive.  Sometimes people can have problems with these muscles and have to relearn proper defecation (emptying) techniques.  If you have weakness in your muscles, organs that are falling out, decreased sensation in your pelvis, or ignore your urge to go, you may find yourself straining to have a bowel movement.  You are straining if you are: . holding your breath or taking in a huge gulp of air and holding it  . keeping your lips and jaw tensed and closed tightly . turning red in the face because of excessive pushing or forcing . developing or worsening your  hemorrhoids . getting faint while pushing . not emptying completely and have to defecate many times a day  If you are straining, you are actually making it harder for yourself to have a bowel movement.  Many people find they are pulling up with the pelvic floor muscles and closing off instead of opening the anus. Due to lack pelvic floor relaxation and coordination the abdominal muscles, one has to work harder to push the feces out.  Many people have never been taught how to defecate efficiently and effectively.  Notice what happens to your body when you are having a bowel movement.  While you are sitting on the   toilet pay attention to the following areas: . Jaw and mouth position . Angle of your hips   . Whether your feet touch the ground or not . Arm placement  . Spine position . Waist . Belly tension . Anus (opening of the anal canal)  An Evacuation/Defecation Plan   Here are the 4 basic points:  1. Lean forward enough for your elbows to rest on your knees 2. Support your feet on the floor or use a low stool if your feet don't touch the floor   3. Push out your belly as if you have swallowed a beach ball-you should feel a widening of your waist 4. Open and relax your pelvic floor muscles, rather than tightening around the anus      The following conditions my require modifications to your toileting posture:  . If you have had surgery in the past that limits your back, hip, pelvic, knee or ankle flexibility . Constipation   Your healthcare practitioner may make the following additional suggestions and adjustments:  1) Sit on the toilet  a) Make sure your feet are supported. b) Notice your hip angle and spine position-most people find it effective to lean forward or raise their knees, which can help the muscles around the anus to relax  c) When you lean forward, place your forearms on your thighs for support  2) Relax suggestions a) Breath deeply in through your nose and out slowly through your mouth as if you are smelling the flowers and blowing out the candles. b) To become aware of how to relax your muscles, contracting and releasing muscles can be helpful.  Pull your pelvic floor muscles in tightly by using the image of holding back gas, or closing around the anus (visualize making a circle smaller) and lifting the anus up and in.  Then release the muscles and your anus should drop down and feel open. Repeat 5 times ending with the feeling of relaxation. c) Keep your pelvic floor muscles relaxed; let your belly bulge out. d) The digestive tract starts at the mouth and ends at the anal opening, so be sure to relax both ends of the tube.  Place your tongue on the roof of your mouth with your teeth separated.  This helps relax your mouth and will help to relax the anus at the same time.  3) Empty (defecation) a) Keep your pelvic floor and sphincter relaxed, then bulge your anal muscles.  Make the anal opening wide.  b) Stick your belly out as if you have swallowed a beach ball. c) Make your belly wall hard using your belly  muscles while continuing to breathe. Doing this makes it easier to open your anus. d) Breath out and give a grunt (or try using other sounds such as ahhhh, shhhhh, ohhhh or grrrrrrr).  4) Finish a) As you finish your bowel movement, pull the pelvic floor muscles up and in.  This will leave your anus in the proper place rather than remaining pushed out and down. If you leave your anus pushed out and down, it will start to feel as though that is normal and give you incorrect signals about needing to have a bowel movement.  Isaiah Blakes Key-E Suppositories with Natural Vitamin E, 24 ea - 2pc  Lindajo Royal Suppository Applicator - 10 Pack Size A2  [place]  Squatty potty- Home depot, target, walmart, bed bath and beyond Earlie Counts, PT [facility] [place], [place] [address]

## 2017-02-10 NOTE — Therapy (Signed)
[facility] [address] [place], [place], [address] Phone: [phone]   Fax:  [phone]  Physical Therapy Treatment  Patient Details  Name: Maria Jackson MRN: [id number] Date of Birth: 1945/07/29 Referring Provider: Dr. Renelda Loma Armbruster  Encounter Date: 02/09/2017      PT End of Session - 02/09/17 1406    Visit Number 2   Number of Visits 10   Date for PT Re-Evaluation 03/21/17   Authorization Type medicare g-code 10th visit   PT Start Time 1400   PT Stop Time 1440   PT Time Calculation (min) 40 min   Activity Tolerance Patient tolerated treatment well   Behavior During Therapy [facility] for tasks assessed/performed      Past Medical History:  Diagnosis Date  . Allergy   . Arthritis    knees  . 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 Cancer ([place]) 11/05/11   SQUAMOS CELL CARCINOMA - LEFT CALF  . Cataract   . Chronic lower back pain   . Concussion   . Diplopia    left eye - when she looks down  . Head injury, closed, with concussion   . Insomnia    NO LONGER AN ISSUE  . Malignant neoplasm of lower-inner quadrant of right female breast ([employer]) 06/11/2016  . Osteopenia   . Osteoporosis   . Raynaud disease   . Seasonal allergies   . Spondylolisthesis    lumbar    Past Surgical History:  Procedure Laterality Date  . APPENDECTOMY    . BREAST LUMPECTOMY WITH RADIOACTIVE SEED LOCALIZATION Right 07/14/2016   Procedure: RIGHT BREAST LUMPECTOMY WITH RADIOACTIVE SEED LOCALIZATION;  Surgeon: Autumn Messing III, MD;  Location: [facility];  Service: General;  Laterality: Right;  . CHOLECYSTECTOMY    . COLONOSCOPY    . ENDOSCOPIC VEIN LASER TREATMENT     calf riht and left  . FOOT NEUROMA SURGERY     bilateral  . JOINT REPLACEMENT    . TONSILLECTOMY    . TOTAL KNEE ARTHROPLASTY Left 04/27/2013   Procedure: LEFT TOTAL KNEE ARTHROPLASTY;  Surgeon: Gearlean Alf, MD;  Location: [facility];  Service: Orthopedics;   Laterality: Left;  . TOTAL KNEE ARTHROPLASTY Right 08/27/2013   Procedure: RIGHT TOTAL KNEE ARTHROPLASTY;  Surgeon: Gearlean Alf, MD;  Location: [facility];  Service: Orthopedics;  Laterality: Right;  . TUBAL LIGATION    . VAGINAL HYSTERECTOMY     tah/bso  . WISDOM TOOTH EXTRACTION      There were no vitals filed for this visit.      Subjective Assessment - 02/09/17 1406    Subjective The exercises are helping.  I do not have to go to the bathroom as often. Urinary leakage is 70% better.  I have coughed a few times without leakage. My bowel movements are easier and not noticing the bowel coming out as much.    Pertinent History s/p breast cancer on 06/09/2016 was estrogen positive with radiation for treatment   Patient Stated Goals work on rectal prolpase, decreased urinary leakage with cough or laugh, sneezing; get back on exercise program   Currently in Pain? No/denies                         [facility] Adult PT Treatment/Exercise - 02/10/17 0001      Self-Care   Self-Care Other Self-Care Comments   Other Self-Care Comments  reviewed vaginal moisturizers  and lubricants     Therapeutic Activites    Therapeutic Activities Other Therapeutic Activities  correct toileting technique     Manual Therapy   Manual Therapy Soft tissue mobilization   Soft tissue mobilization abdominal massage to promote perestalic movement of the intestines and release tightness in the abdominals                PT Education - 02/10/17 0731    Education provided Yes   Education Details reviewed vaginal moisturizers and lubricants, toileting techinque, abdominal massage, squatty potty, toileting meditation   Person(s) Educated Patient   Methods Explanation;Demonstration;Verbal cues;Handout   Comprehension Returned demonstration;Verbalized understanding          PT Short Term Goals - 02/10/17 0735      PT SHORT TERM GOAL #1   Title independent with initial HEP   Time 4    Period Weeks   Status Achieved     PT SHORT TERM GOAL #2   Title pelvic floor strength is >/= 3/5 to reduce fecal incontinence    Time 4   Period Weeks   Status On-going     PT SHORT TERM GOAL #3   Title understand how to manage vaginal dryness with lubricants and moisturizers   Time 4   Period Weeks   Status Achieved           PT Long Term Goals - 01/24/17 1608      PT LONG TERM GOAL #1   Title independent with HEP and understand how to progress herself   Time 8   Period Weeks   Status New   [place] Date 03/21/17     PT LONG TERM GOAL #2   Title reports no rectal prolapse since intial evaluation due to pelvic floor strength >/= 4/5   Time 8   Period Weeks   Status New   Target Date 03/21/17     PT LONG TERM GOAL #3   Title fecal leakage decreased >/= 75% due to increased strength and tone of the pelvic floor   Time 8   Period Weeks   Status New   Target Date 03/21/17     PT LONG TERM GOAL #4   Title understand how to left and exercise with pelvic floor contraction and no bearing down due to no valsalva manuever   Time 8   Period Weeks   Status New   Target Date 03/21/17               Plan - 02/10/17 0732    Clinical Impression Statement Patient reports she has improved. Urinary leakage has improved by 70%.  She does not have to go to the bathroom as much. Patient does not have to strain as much.  Patient is progressing toward her goals.  Patient understands how to use vaginal moisturizers and lubricants. Patient will benefit from skilled therapy to improve muscle control in the pelvic floor.    Rehab Potential Excellent   Clinical Impairments Affecting Rehab Potential s/p breast cancer   PT Frequency 1x / week   PT Duration 8 weeks   PT Treatment/Interventions Therapeutic activities;Therapeutic exercise;Neuromuscular re-education;Patient/family education;Manual techniques;Biofeedback   PT Next Visit Plan abdominal massage; no valslva manuever,, hip  strength   PT Home Exercise Plan progress as needed   Recommended Other Services initial evaluation has been signed by MD   Consulted and Agree with Plan of Care Patient      Patient will benefit from skilled therapeutic intervention  in order to improve the following deficits and impairments:  Decreased coordination, Increased fascial restricitons, Impaired tone, Decreased endurance, Increased muscle spasms, Decreased activity tolerance, Decreased mobility, Decreased strength  Visit Diagnosis: Muscle weakness (generalized)  Unspecified lack of coordination  Other muscle spasm     Problem List Patient Active Problem List   Diagnosis Date Noted  . Osteoporosis 06/20/2016  . Malignant neoplasm of lower-inner quadrant of right breast of female, estrogen receptor positive ([place]) 06/11/2016  . Cephalalgia 03/04/2016  . Diplopia 09/02/2015  . Neck pain on right side 09/02/2015  . Balance disorder 06/05/2015  . Neck pain 05/15/2015  . Headache 05/15/2015  . Postoperative anemia due to acute blood loss 08/28/2013  . OA (osteoarthritis) of knee 04/27/2013  . 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 Vaginal atrophy 05/25/2012  . CONSTIPATION 08/27/2009  . ULCERATION OF INTESTINE 08/27/2009    Earlie Counts, PT 02/10/17 7:37 AM   [facility] [address] [place], [place], [address] Phone: [phone]   Fax:  [phone]  Name: Maria Jackson MRN: [id number] Date of Birth: Feb 25, 1946

## 2017-02-23 ENCOUNTER — Ambulatory Visit: Payer: Medicare Other | Admitting: Physical Therapy

## 2017-02-23 ENCOUNTER — Encounter: Payer: Self-pay | Admitting: Physical Therapy

## 2017-02-23 DIAGNOSIS — R279 Unspecified lack of coordination: Secondary | ICD-10-CM

## 2017-02-23 DIAGNOSIS — M6281 Muscle weakness (generalized): Secondary | ICD-10-CM | POA: Diagnosis not present

## 2017-02-23 NOTE — Patient Instructions (Addendum)
Isometric Hold (Hook-Lying)    Lie with hips and knees bent. Slowly inhale, and then exhale. Pull navel toward spine and Hold for _5__ seconds. Continue to breathe in and out during hold. Rest for _5__ seconds. Repeat _5__ times. Do _1__ times a day.   Copyright  VHI. All rights reserved.     Bracing With Knee Fallout (Hook-Lying)    With neutral spine, tighten pelvic floor and abdominals and hold. Alternating legs, drop knee out to side. Keep opposite hip still. Repeat _10__ times. Do _1__ times a day.   Copyright  VHI. All rights reserved.    Bracing With Leg March (Hook-Lying)    With neutral spine, tighten pelvic floor and abdominals and hold. Alternating legs, lift foot _6__ inches and return to floor. Repeat _10__ times. Do _1__ times a day.   Copyright  VHI. All rights reserved.  Side Leg Lift    Lie on side, back straight along edge of mat, legs 30 in front of torso. Flexing foot, lift top leg to 45 without hiking hip. Lower leg, foot pointed. Repeat __10__ times. Repeat on other side. Do _1___ sessions per day.  [email or web address].exer.[email or web address]   Copyright  VHI. All rights reserved.  Bracing With Bridging (Hook-Lying)    With neutral spine, tighten pelvic floor and abdominals and hold. Lift bottom. Repeat _15__ times. Do _1__ times a day.   Copyright  VHI. All rights reserved.     Stand with forearms on counter and do cat/camel  10x.    [facility] [address], [place], [place] [address] Phone # [phone] Fax [phone]

## 2017-02-23 NOTE — Therapy (Signed)
[facility] [address] [place], [place], [address] Phone: [phone]   Fax:  [phone]  Physical Therapy Treatment  Patient Details  Name: Maria Jackson MRN: [id number] Date of Birth: 1945/07/08 Referring Provider: Dr. Renelda Loma Armbruster  Encounter Date: 02/23/2017      PT End of Session - 02/23/17 1412    Visit Number 3   Number of Visits 10   Date for PT Re-Evaluation 03/21/17   Authorization Type medicare g-code 10th visit   PT Start Time 1405   PT Stop Time 1445   PT Time Calculation (min) 40 min   Activity Tolerance Patient tolerated treatment well   Behavior During Therapy [facility] for tasks assessed/performed      Past Medical History:  Diagnosis Date  . Allergy   . Arthritis    knees  . 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 Cancer ([place]) 11/05/11   SQUAMOS CELL CARCINOMA - LEFT CALF  . Cataract   . Chronic lower back pain   . Concussion   . Diplopia    left eye - when she looks down  . Head injury, closed, with concussion   . Insomnia    NO LONGER AN ISSUE  . 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 Osteopenia   . Osteoporosis   . Raynaud disease   . Seasonal allergies   . Spondylolisthesis    lumbar    Past Surgical History:  Procedure Laterality Date  . APPENDECTOMY    . BREAST LUMPECTOMY WITH RADIOACTIVE SEED LOCALIZATION Right 07/14/2016   Procedure: RIGHT BREAST LUMPECTOMY WITH RADIOACTIVE SEED LOCALIZATION;  Surgeon: Autumn Messing III, MD;  Location: [place];  Service: General;  Laterality: Right;  . CHOLECYSTECTOMY    . COLONOSCOPY    . ENDOSCOPIC VEIN LASER TREATMENT     calf riht and left  . FOOT NEUROMA SURGERY     bilateral  . JOINT REPLACEMENT    . TONSILLECTOMY    . TOTAL KNEE ARTHROPLASTY Left 04/27/2013   Procedure: LEFT TOTAL KNEE ARTHROPLASTY;  Surgeon: Gearlean Alf, MD;  [facility];  Service:  Orthopedics;  Laterality: Left;  . TOTAL KNEE ARTHROPLASTY Right 08/27/2013   Procedure: RIGHT TOTAL KNEE ARTHROPLASTY;  Surgeon: Gearlean Alf, MD;  Location: [facility];  Service: Orthopedics;  Laterality: Right;  . TUBAL LIGATION    . VAGINAL HYSTERECTOMY     tah/bso  . WISDOM TOOTH EXTRACTION      There were no vitals filed for this visit.      Subjective Assessment - 02/23/17 1407    Subjective I still notice a big difference. I am doing the diaphgramatic breathing to help me empty my bladder.    Pertinent History s/p breast cancer on 06/09/2016 was estrogen positive with radiation for treatment   Patient Stated Goals work on rectal prolpase, decreased urinary leakage with cough or laugh, sneezing; get back on exercise program   Currently in Pain? No/denies                                 PT Education - 02/23/17 1432    Education provided Yes   Education Details back exercises, hip exercises, pelvic floor exercises   Person(s) Educated Patient   Methods Explanation;Demonstration;Handout;Verbal cues   Comprehension Returned demonstration;Verbalized understanding  PT Short Term Goals - 02/23/17 1409      PT SHORT TERM GOAL #1   Title independent with initial HEP   Time 4   Period Weeks   Status Achieved     PT SHORT TERM GOAL #3   Title understand how to manage vaginal dryness with lubricants and moisturizers   Time 4   Period Weeks   Status Achieved           PT Long Term Goals - 02/23/17 1409      PT LONG TERM GOAL #1   Title independent with HEP and understand how to progress herself   Time 8   Period Weeks   Status New     PT LONG TERM GOAL #2   Title reports no rectal prolapse since intial evaluation due to pelvic floor strength >/= 4/5   Baseline --   Time 8   Period Weeks   Status New     PT LONG TERM GOAL #3   Title fecal leakage decreased >/= 75% due to increased strength and tone of the pelvic floor    Baseline --   Time 8   Period Weeks   Status New  75% better     PT LONG TERM GOAL #4   Title understand how to left and exercise with pelvic floor contraction and no bearing down due to no valsalva manuever   Baseline --   Time 8   Period Weeks   Status New     PT LONG TERM GOAL #5   Title ------   Baseline -----               Plan - 02/23/17 1413    Clinical Impression Statement No leakage with laughing. When patient coughs or sneeze  and does not contract the pelvic floor she will leak urine. Rectal prolapse is 85% better. Patient  is now on an exercise program.  Patient will benefit from skilled therapy to improve pelvic floor strength to decrease fecal leakage and urinary leakage.    Rehab Potential Excellent   Clinical Impairments Affecting Rehab Potential s/p breast cancer   PT Frequency 1x / week   PT Duration 8 weeks   PT Treatment/Interventions Therapeutic activities;Therapeutic exercise;Neuromuscular re-education;Patient/family education;Manual techniques;Biofeedback   PT Next Visit Plan progress pelvic floor strength; valsalva maneuver   PT Home Exercise Plan progress as needed   Consulted and Agree with Plan of Care Patient      Patient will benefit from skilled therapeutic intervention in order to improve the following deficits and impairments:  Decreased coordination, Increased fascial restricitons, Impaired tone, Decreased endurance, Increased muscle spasms, Decreased activity tolerance, Decreased mobility, Decreased strength  Visit Diagnosis: Muscle weakness (generalized)  Unspecified lack of coordination     Problem List Patient Active Problem List   Diagnosis Date Noted  . Osteoporosis 06/20/2016  . Malignant neoplasm of lower-inner quadrant of right breast of female, estrogen receptor positive ([place]) 06/11/2016  . Cephalalgia 03/04/2016  . Diplopia 09/02/2015  . Neck pain on right side 09/02/2015  . Balance disorder 06/05/2015  . Neck pain  05/15/2015  . Headache 05/15/2015  . Postoperative anemia due to acute blood loss 08/28/2013  . OA (osteoarthritis) of knee 04/27/2013  . 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 Vaginal atrophy 05/25/2012  . CONSTIPATION 08/27/2009  . ULCERATION OF INTESTINE 08/27/2009    Earlie Counts, PT 02/23/17 2:42 PM   [facility] [address]  [address], [place], [place], [address] Phone: [phone]   Fax:  [phone]  Name: ANGENI CHAUDHURI MRN: [id number] Date of Birth: [birth year]

## 2017-03-09 DIAGNOSIS — M810 Age-related osteoporosis without current pathological fracture: Secondary | ICD-10-CM | POA: Diagnosis not present

## 2017-03-09 DIAGNOSIS — Z0000 Encounter for general adult medical examination without abnormal findings: Secondary | ICD-10-CM | POA: Diagnosis not present

## 2017-03-09 NOTE — Progress Notes (Signed)
CLINIC:  Survivorship   REASON FOR VISIT:  Routine follow-up post-treatment for a recent history of breast cancer.  BRIEF ONCOLOGIC HISTORY:    Malignant neoplasm of lower-inner quadrant of right breast of female, estrogen receptor positive ([place])   06/03/2016 Initial Biopsy    Right breast lower inner quadrant biopsy: DCIS, high grade, ER+(95%), PR-(0%).        06/11/2016 Initial Diagnosis    Malignant neoplasm of lower-inner quadrant of right breast of female, estrogen receptor positive ([place])     07/14/2016 Surgery    Right breast lumpectomy: DCIS, high grade, 0.8cm, negative margins      08/09/2016 - 09/03/2016 Radiation Therapy    Adjuvant radiation:1) Right breast/ 42.5 Gy in 17 fractions  2) Right breast boost/ 7.5 Gy in 3 fractions      09/2016 -  Anti-estrogen oral therapy    Tamoxifen daily, bone density 05/2016 demonstrated osteoporosis with T score of -2.5       INTERVAL HISTORY:  Maria Jackson presents to [facility] today for our initial meeting to review her survivorship care plan detailing her treatment course for breast cancer, as well as monitoring long-term side effects of that treatment, education regarding health maintenance, screening, and overall wellness and health promotion.     Overall, Maria Jackson reports feeling quite well.  She continues to take Tamoxifen daily and does experience hot flashes due to this.  She has a system worked out to where she doesn't miss a dose.  She has a PCP she sees him regularly, and actually has an annual exam next week.  She is exercising regularly.      REVIEW OF SYSTEMS:  Review of Systems  Constitutional: Negative for appetite change, chills, fatigue, fever and unexpected weight change.  HENT:   Negative for hearing loss and lump/mass.   Eyes: Negative for eye problems and icterus.  Respiratory: Negative for chest tightness, cough and shortness of breath.   Cardiovascular: Negative for chest pain, leg swelling  and palpitations.  Gastrointestinal: Negative for abdominal distention, abdominal pain, constipation, diarrhea, nausea and vomiting.  Endocrine: Negative for hot flashes.  Musculoskeletal: Negative for arthralgias.  Skin: Negative for itching and rash.  Neurological: Negative for dizziness, extremity weakness, headaches and numbness.  Hematological: Negative for adenopathy. Does not bruise/bleed easily.  Psychiatric/Behavioral: Negative for depression. The patient is not nervous/anxious.   Breast: Denies any new nodularity, masses, tenderness, nipple changes, or nipple discharge.      ONCOLOGY TREATMENT TEAM:  1. Surgeon:  Dr. Marlou Starks at [facility] 2. Medical Oncologist: Dr. Jana Hakim  3. Radiation Oncologist: Dr. Lisbeth Renshaw    PAST MEDICAL/SURGICAL HISTORY:  Past Medical History:  Diagnosis Date  . Allergy   . Arthritis    knees  . 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 Cancer ([place]) 11/05/11   SQUAMOS CELL CARCINOMA - LEFT CALF  . Cataract   . Chronic lower back pain   . Concussion   . Diplopia    left eye - when she looks down  . Head injury, closed, with concussion   . Insomnia    NO LONGER AN ISSUE  . 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 Osteopenia   . Osteoporosis   . Raynaud disease   . Seasonal allergies   . Spondylolisthesis    lumbar   Past Surgical History:  Procedure Laterality Date  . APPENDECTOMY    . BREAST LUMPECTOMY WITH  RADIOACTIVE SEED LOCALIZATION Right 07/14/2016   Procedure: RIGHT BREAST LUMPECTOMY WITH RADIOACTIVE SEED LOCALIZATION;  Surgeon: Autumn Messing III, MD;  Location: [place];  Service: General;  Laterality: Right;  . CHOLECYSTECTOMY    . COLONOSCOPY    . ENDOSCOPIC VEIN LASER TREATMENT     calf riht and left  . FOOT NEUROMA SURGERY     bilateral  . JOINT REPLACEMENT    . TONSILLECTOMY    . TOTAL KNEE ARTHROPLASTY Left 04/27/2013   Procedure: LEFT TOTAL KNEE ARTHROPLASTY;   Surgeon: Gearlean Alf, MD;  [facility];  Service: Orthopedics;  Laterality: Left;  . TOTAL KNEE ARTHROPLASTY Right 08/27/2013   Procedure: RIGHT TOTAL KNEE ARTHROPLASTY;  Surgeon: Gearlean Alf, MD;  [facility];  Service: Orthopedics;  Laterality: Right;  . TUBAL LIGATION    . VAGINAL HYSTERECTOMY     tah/bso  . WISDOM TOOTH EXTRACTION       ALLERGIES:  Allergies  Allergen Reactions  . Bee Venom Itching, Swelling and Other (See Comments)    SWELLING REACTION UNSPECIFIED  REDNESS ITCHING  . Ibuprofen Other (See Comments)    Mouth ulcers  . Cephalexin     UNSPECIFIED REACTION   . Penicillins     Has patient had a PCN reaction causing immediate rash, facial/tongue/throat swelling, SOB or lightheadedness with hypotension:unsure Has patient had a PCN reaction causing severe rash involving mucus membranes or skin necrosis:unsure Has patient had a PCN reaction that required hospitalization:No Has patient had a PCN reaction occurring within the last 10 years:No Reaction unknown If all of the above answers are "NO", then may proceed with Cephalosporin use.   . Sulfonamide Derivatives Rash     CURRENT MEDICATIONS:  Outpatient Encounter Prescriptions as of 03/10/2017  Medication Sig  . aspirin 81 MG tablet Take 81 mg by mouth daily.  . cholecalciferol (VITAMIN D) 1000 units tablet Take 2,000 Units by mouth daily.  Marland Kitchen conjugated estrogens (PREMARIN) vaginal cream Place 1 Applicatorful vaginally 2 (two) times a week. Taking .5 of the applicator twice a week  . cycloSPORINE (RESTASIS) 0.05 % ophthalmic emulsion Place 1 drop into both eyes 2 (two) times daily.  Marland Kitchen denosumab (PROLIA) 60 MG/ML SOLN injection Inject 60 mg into the skin every 6 (six) months. Administer in upper arm, thigh, or abdomen  . loratadine (CLARITIN) 10 MG tablet Take 10 mg by mouth as needed for allergies.  . Lutein 6 MG CAPS Take 6 mg by mouth 2 (two) times daily.   . Magnesium 250 MG TABS Take 250 mg by  mouth 2 (two) times daily.   . Multiple Vitamin (MULTIVITAMIN WITH MINERALS) TABS tablet Take 1 tablet by mouth daily. Centrum Silver for Women  . nitrofurantoin, macrocrystal-monohydrate, (MACROBID) 100 MG capsule Take 100 mg by mouth daily as needed (for UTI prevention).  . Omega-3 Fatty Acids (FISH OIL) 1200 MG CAPS Take 1,200 mg by mouth daily.  . pantoprazole (PROTONIX) 40 MG tablet   . Probiotic Product (ALIGN) 4 MG CAPS Take 4 mg by mouth at bedtime.  . sodium chloride (OCEAN) 0.65 % nasal spray Place 1 spray into the nose 2 (two) times daily as needed for congestion.   . tamoxifen (NOLVADEX) 20 MG tablet Take 20 mg by mouth daily.  Marland Kitchen triamcinolone (NASACORT AQ) 55 MCG/ACT AERO nasal inhaler Place 2 sprays into the nose daily as needed (for allergies.).  . [DISCONTINUED] azithromycin (ZITHROMAX) 500 MG tablet Take 500 mg by mouth as directed. 500 mg  by mouth 1 hour prior to dental work.   Facility-Administered Encounter Medications as of 03/10/2017  Medication  . 0.9 %  sodium chloride infusion     ONCOLOGIC FAMILY HISTORY:  Family History  Problem Relation Age of Onset  . Diabetes Mother        CVA - 07/06/16  . Heart disease Mother   . Heart disease Father        Cancer of eye, Multiple Mylemona  . Colon cancer Neg Hx   . Esophageal cancer Neg Hx   . Stomach cancer Neg Hx   . Rectal cancer Neg Hx     SOCIAL HISTORY:  Unchanged. She denies any current or history of tobacco, alcohol, or illicit drug use.     PHYSICAL EXAMINATION:  Vital Signs:   Vitals:   03/10/17 0911  BP: (!) 149/61  Pulse: 71  Resp: 16  Temp: 97.8 F (36.6 C)  SpO2: 100%   Filed Weights   03/10/17 0911  Weight: 152 lb 1.6 oz (69 kg)   General: Well-nourished, well-appearing female in no acute distress.  She is unaccompanied today.   HEENT: Head is normocephalic.  Pupils equal and reactive to light. Conjunctivae clear without exudate.  Sclerae anicteric. Oral mucosa is pink, moist.   Oropharynx is pink without lesions or erythema.  Lymph: No cervical, supraclavicular, or infraclavicular lymphadenopathy noted on palpation.  Cardiovascular: Regular rate and rhythm.Marland Kitchen Respiratory: Clear to auscultation bilaterally. Chest expansion symmetric; breathing non-labored.  GI: Abdomen soft and round; non-tender, non-distended. Bowel sounds normoactive.  GU: Deferred.  Neuro: No focal deficits. Steady gait.  Psych: Mood and affect normal and appropriate for situation.  Extremities: No edema. MSK: No focal spinal tenderness to palpation.  Full range of motion in bilateral upper extremities Skin: Warm and dry.  LABORATORY DATA:  None for this visit.  DIAGNOSTIC IMAGING:  None for this visit.      ASSESSMENT AND PLAN:  Maria Jackson is a pleasant 71 y.o. female with Stage 0 right breast DCIS, ER+/PR-, diagnosed in 06/2016, treated with lumpectomy, adjuvant radiation therapy, and anti-estrogen therapy with Tamoxifen beginning in 09/2016.  She presents to the [facility]orship Clinic for our initial meeting and routine follow-up post-completion of treatment for breast cancer.    1. Stage 0 right breast cancer:  Maria Jackson is continuing to recover from definitive treatment for breast cancer. She will follow-up with her medical oncologist, Dr. Jana Hakim in 09/2017 with history and physical exam per surveillance protocol.  She will continue her anti-estrogen therapy with Tamoxifen. Thus far, she is tolerating the Tamoxifen well, with minimal side effects. She was instructed to make Dr. Lorelee Market myself aware if she begins to experience any worsening side effects of the medication and I could see her back in clinic to help manage those side effects, as needed.  Today, a comprehensive survivorship care plan and treatment summary was reviewed with the patient today detailing her breast cancer diagnosis, treatment course, potential late/long-term effects of treatment, appropriate follow-up care with  recommendations for the future, and patient education resources.  A copy of this summary, along with a letter will be sent to the patient's primary care provider via mail/fax/In Basket message after today's visit.    2. Bone health:  Given Maria Jackson's age/history of breast cancer, she is at risk for bone demineralization.  Her last DEXA scan was in 05/2016 and was consistent with osteoporosis with a T score of -2.5.  She is currently receiving Prolia every 6  months at Dr. Trena Platt office.  I reinforced the need for calcium, vitamin d and weight bearing exercises.  She was counseled that Tamoxifen has a protective effect on her bones.  She was given education on specific activities to promote bone health.  3. Cancer screening:  Due to Maria Jackson's history and her age, she should receive screening for skin cancers, colon cancer, and gynecologic cancers.  The information and recommendations are listed on the patient's comprehensive care plan/treatment summary and were reviewed in detail with the patient.    4. Health maintenance and wellness promotion: Maria Jackson was encouraged to consume 5-7 servings of fruits and vegetables per day. We reviewed the "Nutrition Rainbow" handout, as well as the handout "Take Control of Your Health and Reduce Your Cancer Risk" from the [employer].  She was also encouraged to engage in moderate to vigorous exercise for 30 minutes per day most days of the week. We discussed the [employer] fitness program, which is designed for cancer survivors to help them become more physically fit after cancer treatments.  She was instructed to limit her alcohol consumption and continue to abstain from tobacco use.     5. Support services/counseling: It is not uncommon for this period of the patient's cancer care trajectory to be one of many emotions and stressors.  We discussed an opportunity for her to participate in the next session of [facility] ("Finding Your New Normal") support  group series designed for patients after they have completed treatment.   Maria Jackson was encouraged to take advantage of our many other support services programs, support groups, and/or counseling in coping with her new life as a cancer survivor after completing anti-cancer treatment.  She was offered support today through active listening and expressive supportive counseling.  She was given information regarding our available services and encouraged to contact me with any questions or for help enrolling in any of our support group/programs.    Dispo:   -Return to cancer center in 09/2017 for follow up with Dr. Jana Hakim  -Mammogram due in 05/2017 at [facility] -Follow up with Dr. Marlou Starks at [facility] in 04/2017 -She is welcome to return back to the Survivorship Clinic at any time; no additional follow-up needed at this time.  -Consider referral back to survivorship as a long-term survivor for continued surveillance  A total of (30) minutes of face-to-face time was spent with this patient with greater than 50% of that time in counseling and care-coordination.   Gardenia Phlegm, [place] [phone]   Note: PRIMARY CARE PROVIDER Marton Redwood, [place] [phone]

## 2017-03-10 ENCOUNTER — Ambulatory Visit (HOSPITAL_BASED_OUTPATIENT_CLINIC_OR_DEPARTMENT_OTHER): Payer: Medicare Other | Admitting: Adult Health

## 2017-03-10 ENCOUNTER — Encounter: Payer: Self-pay | Admitting: Adult Health

## 2017-03-10 VITALS — BP 149/61 | HR 71 | Temp 97.8°F | Resp 16 | Ht 63.0 in | Wt 152.1 lb

## 2017-03-10 DIAGNOSIS — M810 Age-related osteoporosis without current pathological fracture: Secondary | ICD-10-CM | POA: Diagnosis not present

## 2017-03-10 DIAGNOSIS — Z79811 Long term (current) use of aromatase inhibitors: Secondary | ICD-10-CM | POA: Diagnosis not present

## 2017-03-10 DIAGNOSIS — C50311 Malignant neoplasm of lower-inner quadrant of right female breast: Secondary | ICD-10-CM | POA: Diagnosis not present

## 2017-03-10 DIAGNOSIS — Z170 Estrogen receptor positive status [ER+]: Secondary | ICD-10-CM | POA: Diagnosis not present

## 2017-03-16 ENCOUNTER — Encounter: Payer: Self-pay | Admitting: Physical Therapy

## 2017-03-16 ENCOUNTER — Ambulatory Visit: Payer: Medicare Other | Attending: Gastroenterology | Admitting: Physical Therapy

## 2017-03-16 DIAGNOSIS — M810 Age-related osteoporosis without current pathological fracture: Secondary | ICD-10-CM | POA: Diagnosis not present

## 2017-03-16 DIAGNOSIS — M6281 Muscle weakness (generalized): Secondary | ICD-10-CM | POA: Diagnosis not present

## 2017-03-16 DIAGNOSIS — M62838 Other muscle spasm: Secondary | ICD-10-CM

## 2017-03-16 DIAGNOSIS — D0511 Intraductal carcinoma in situ of right breast: Secondary | ICD-10-CM | POA: Diagnosis not present

## 2017-03-16 DIAGNOSIS — R279 Unspecified lack of coordination: Secondary | ICD-10-CM

## 2017-03-16 DIAGNOSIS — Z0000 Encounter for general adult medical examination without abnormal findings: Secondary | ICD-10-CM | POA: Diagnosis not present

## 2017-03-16 DIAGNOSIS — Z8719 Personal history of other diseases of the digestive system: Secondary | ICD-10-CM | POA: Diagnosis not present

## 2017-03-16 NOTE — Patient Instructions (Addendum)
Cough: Phase 4 (Sitting)    Squeeze pelvic floor and hold. Inhale. Lean shoulders forward. Cough repeatedly. Relax. Repeat _5__ times. Do __2_ times a day.  Copyright  VHI. All rights reserved.  Sit to Stand: Phase 3    Sitting, squeeze pelvic floor and hold. Lean trunk forward. Push up on arms and stand up. Relax. When you go from sit to stand.   Copyright  VHI. All rights reserved.  Slow Contraction: Gravity Resisted (Sitting)    Sitting, slowly squeeze pelvic floor for _10__ seconds. Rest for _5__ seconds. Repeat __5_ times. Do _3__ times a day. Then 5 sec hold 5 times focusing on breathing.   Copyright  VHI. All rights reserved.  Ask doctor about antidepressant for mood.   [facility] [address] [place], [place] [address] Phone # [phone] Fax [phone]

## 2017-03-16 NOTE — Therapy (Signed)
[facility] [address], [place], [place], [address] Phone: [phone]   Fax:  [phone]  Physical Therapy Treatment  Patient Details  Name: Maria Jackson MRN: [id number] Date of Birth: March 03, 1946 Referring Provider: Dr. Renelda Loma Armbruster  Encounter Date: 03/16/2017      PT End of Session - 03/16/17 1050    Visit Number 4   Number of Visits 10   Date for PT Re-Evaluation 05/16/17   Authorization Type medicare g-code 10th visit   PT Start Time 1015   PT Stop Time 1055   PT Time Calculation (min) 40 min   Activity Tolerance Patient tolerated treatment well   Behavior During Therapy [facility] for tasks assessed/performed      Past Medical History:  Diagnosis Date  . Allergy   . Arthritis    knees  . 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 Cancer ([place]) 11/05/11   SQUAMOS CELL CARCINOMA - LEFT CALF  . Cataract   . Chronic lower back pain   . Concussion   . Diplopia    left eye - when she looks down  . Head injury, closed, with concussion   . Insomnia    NO LONGER AN ISSUE  . Malignant neoplasm of lower-inner quadrant of right female breast ([place]) 06/11/2016  . Osteopenia   . Osteoporosis   . Raynaud disease   . Seasonal allergies   . Spondylolisthesis    lumbar    Past Surgical History:  Procedure Laterality Date  . APPENDECTOMY    . BREAST LUMPECTOMY WITH RADIOACTIVE SEED LOCALIZATION Right 07/14/2016   Procedure: RIGHT BREAST LUMPECTOMY WITH RADIOACTIVE SEED LOCALIZATION;  Surgeon: Autumn Messing III, MD;  Location: [facility];  Service: General;  Laterality: Right;  . CHOLECYSTECTOMY    . COLONOSCOPY    . ENDOSCOPIC VEIN LASER TREATMENT     calf riht and left  . FOOT NEUROMA SURGERY     bilateral  . JOINT REPLACEMENT    . TONSILLECTOMY    . TOTAL KNEE ARTHROPLASTY Left 04/27/2013   Procedure: LEFT TOTAL KNEE ARTHROPLASTY;  Surgeon: Gearlean Alf, MD;  [facility];  Service:  Orthopedics;  Laterality: Left;  . TOTAL KNEE ARTHROPLASTY Right 08/27/2013   Procedure: RIGHT TOTAL KNEE ARTHROPLASTY;  Surgeon: Gearlean Alf, MD;  [facility];  Service: Orthopedics;  Laterality: Right;  . TUBAL LIGATION    . VAGINAL HYSTERECTOMY     tah/bso  . WISDOM TOOTH EXTRACTION      There were no vitals filed for this visit.      Subjective Assessment - 03/16/17 1017    Subjective Fecal leakage is not better this week from last week but from the initial evaluation is 50% better.  Prolpase is 75% better. Sometimes I will have urinary leakage. It happens when I am relaxed and have a big cough. I am having some fecal leakage.  The abdominal massage helps with going to the bathroom.  I am unable to do the back exercises due to increase pain.     Pertinent History s/p breast cancer on 06/09/2016 was estrogen positive with radiation for treatment   Patient Stated Goals work on rectal prolpase, decreased urinary leakage with cough or laugh, sneezing; get back on exercise program   Currently in Pain? No/denies   Multiple Pain Sites No            [facility] PT Assessment - 03/16/17 0001  Assessment   Medical Diagnosis K62.3 rectal prolapse   Onset Date/Surgical Date 08/05/16   Prior Therapy None for pelvic floor     Precautions   Precautions Other (comment)   Precaution Comments breast cancer      Restrictions   Weight Bearing Restrictions No     Home Environment   Living Environment Private residence     Prior Function   Level of [place] Retired   Leisure used to exercise at [employer]   Overall Cognitive Status Within Functional Limits for tasks assessed     Observation/Other Assessments   Focus on Therapeutic Outcomes (FOTO)  functional limitation as per therapist discretion is 30% limited  goal is 20% limitation     Strength   Right Hip Flexion 4+/5   Right Hip Extension 4+/5   Right Hip ABduction 4+/5   Left  Hip Flexion 4+/5   Left Hip Extension 4+/5   Left Hip ABduction 4+/5                  Pelvic Floor Special Questions - 03/16/17 0001    Currently Sexually Active Yes   Is this Painful Yes   Marinoff Scale discomfort that does not affect completion  due to dryness   Urinary Leakage Yes   Pad use 1 light day   Activities that cause leaking Sneezing;Coughing;Laughing   Fecal incontinence Yes  skid mark daily to every other day   Strength fair squeeze, definite lift   Biofeedback resting tone averages 0.9 uv ; contract pelvic floor while breating and keeping above 15 uv   Biofeedback sensor type Surface  rectum   Biofeedback Activity 10 second hold;Quick contraction                   PT Education - 03/16/17 1050    Education provided Yes   Education Details pelvic floor contraction with activities and sitting   Person(s) Educated Patient   Methods Explanation;Demonstration;Verbal cues;Handout   Comprehension Returned demonstration;Verbalized understanding          PT Short Term Goals - 03/16/17 1020      PT SHORT TERM GOAL #1   Title independent with initial HEP   Time 4   Period Weeks   Status Achieved     PT SHORT TERM GOAL #2   Title pelvic floor strength is >/= 3/5 to reduce fecal incontinence    Time 4   Period Weeks   Status Achieved     PT SHORT TERM GOAL #3   Title understand how to manage vaginal dryness with lubricants and moisturizers   Time 4   Period Weeks   Status Achieved           PT Long Term Goals - 02/23/17 1409      PT LONG TERM GOAL #1   Title independent with HEP and understand how to progress herself   Time 8   Period Weeks   Status New     PT LONG TERM GOAL #2   Title reports no rectal prolapse since intial evaluation due to pelvic floor strength >/= 4/5   Baseline --   Time 8   Period Weeks   Status New     PT LONG TERM GOAL #3   Title fecal leakage decreased >/= 75% due to increased strength and  tone of the pelvic floor   Baseline --   Time 8   Period  Weeks   Status New  75% better     PT LONG TERM GOAL #4   Title understand how to left and exercise with pelvic floor contraction and no bearing down due to no valsalva manuever   Baseline --   Time 8   Period Weeks   Status New     PT LONG TERM GOAL #5   Title ------   Baseline -----               Plan - 03/16/17 1056    Clinical Impression Statement Patient has increased strength in hips to 4+/5.  Pelvic floor strength is 3/5.  Patient has difficulty with fecal leakage with big coughs.  Resting tone for the rectal area is 0.9uv.  She is able to hold a pelvic floor contraction above 15 uv for 5 seconds and breathing but linger she will lose the contraction. Patient reports her prolapse is 75% better and fecal leakage is 50% better. Patient is not able to do the back exercises due to pain . Patient will benefit from skilled therapy to improve pelvic floor strength to decrease fecal leakage and urinary leakage.    Rehab Potential Excellent   Clinical Impairments Affecting Rehab Potential s/p breast cancer   PT Frequency 1x / week   PT Duration 8 weeks   PT Treatment/Interventions Therapeutic activities;Therapeutic exercise;Neuromuscular re-education;Patient/family education;Manual techniques;Biofeedback   PT Next Visit Plan progress pelvic floor strength with EMG with hip strength; standing activities   PT Home Exercise Plan progress as needed   Recommended Other Services renewal sent to MD   Consulted and Agree with Plan of Care Patient      Patient will benefit from skilled therapeutic intervention in order to improve the following deficits and impairments:  Decreased coordination, Increased fascial restricitons, Impaired tone, Decreased endurance, Increased muscle spasms, Decreased activity tolerance, Decreased mobility, Decreased strength  Visit Diagnosis: Muscle weakness (generalized) - Plan: PT plan of care  cert/re-cert  Unspecified lack of coordination - Plan: PT plan of care cert/re-cert  Other muscle spasm - Plan: PT plan of care cert/re-cert     Problem List Patient Active Problem List   Diagnosis Date Noted  . Osteoporosis 06/20/2016  . Malignant neoplasm of lower-inner quadrant of right breast of female, estrogen receptor positive ([place]) 06/11/2016  . Cephalalgia 03/04/2016  . Diplopia 09/02/2015  . Neck pain on right side 09/02/2015  . Balance disorder 06/05/2015  . Neck pain 05/15/2015  . Headache 05/15/2015  . Postoperative anemia due to acute blood loss 08/28/2013  . OA (osteoarthritis) of knee 04/27/2013  . 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 Vaginal atrophy 05/25/2012  . CONSTIPATION 08/27/2009  . ULCERATION OF INTESTINE 08/27/2009    Earlie Counts, PT 03/16/17 11:12 AM   [facility] [address] [place], [place], [address] Phone: [phone]   Fax:  [phone]  Name: ROSHAWNDA PECORA MRN: [id number] Date of Birth: 12-22-1945

## 2017-04-08 ENCOUNTER — Ambulatory Visit: Payer: Medicare Other | Admitting: Physical Therapy

## 2017-04-18 ENCOUNTER — Encounter: Payer: Self-pay | Admitting: Physical Therapy

## 2017-04-18 ENCOUNTER — Ambulatory Visit: Payer: Medicare Other | Attending: Gastroenterology | Admitting: Physical Therapy

## 2017-04-18 DIAGNOSIS — M6281 Muscle weakness (generalized): Secondary | ICD-10-CM | POA: Diagnosis not present

## 2017-04-18 DIAGNOSIS — R279 Unspecified lack of coordination: Secondary | ICD-10-CM | POA: Diagnosis not present

## 2017-04-18 DIAGNOSIS — M62838 Other muscle spasm: Secondary | ICD-10-CM | POA: Diagnosis not present

## 2017-04-18 NOTE — Patient Instructions (Addendum)
Lifting Principles  .Maintain proper posture and head alignment. .Slide object as close as possible before lifting. .Move obstacles out of the way. .Test before lifting; ask for help if too heavy. .Tighten stomach muscles without holding breath. .Use smooth movements; do not jerk. .Use legs to do the work, and pivot with feet. .Distribute the work load symmetrically and close to the center of trunk. .Push instead of pull whenever possible.  Copyright  VHI. All rights reserved.   10-step guide to putting your pelvic floor first  1. Avoid heavy lifting. Keep your weights within a manageable range. Never lift heavy weights that make you strain or hold your breath. Avoid lifting weights from ground level. Aim to lift from waist height instead.  2.Use your pelvic floor muscles.  Activate your pelvic floor muscles prior to and during resistance exercises. The goal is for your pelvic floor to be working immediately before and as you lift/lower/push or pull any load.  3.Lift with good posture. Maintain the normal inward curve in your lower back during every lift/lower/push/pull exercise you do, regardless of whether you are sitting, standing or lying on your back.  4.Exhale with every effort. Never hold your breath or pull your tummy in strongly during an exercise. This increases the downward pressure on your pelvic floor. Breathe out with every effort, whether it is a lift, push or pull.  5.Choose supported positions Your pelvic floor will be under less strain if you perform your resistance exercises sitting or lying down wherever possible. Sitting on a Swiss ball is an excellent option.  6.Keep your feet hip width apart.  You will find it easier to activate your pelvic floor muscles by keeping your feet close together. If you are performing a standing resistance exercise keep your feet no wider than hip width apart.  7.Strengthen gradually.. Start using light resistance and pay attention to  performing the exercise correctly to reduce your risk of injury. Gradually increase your resistance when you are confident of your technique. Contract pelvic floor with exercise.   8.Take care when fatigued or injured Your pelvic floor and deep abdominal muscles may not work as effectively when you are tired, unwell or have lower back pain. This may make you more prone to symptoms and injury. Take a break and return to resistance training when you have recovered.  9.Rest between sets Rest for a couple of minutes between each set of exercises you perform. This gives your muscles (including your pelvic floor muscles) time to recover before your next lift.  10. Avoid aggravating exercises and machines. Listen to your body when exercising. If your symptoms are worse with a specific exercise, modify it or leave it and perform another exercise to strengthen the same area instead.  Speak to an exercise or continence professional if you experience any pelvic floor problems when you exercise. Reproduced with kind permission from Mauri Brooklyn, Physiotherapist, Chief Strategy Officer of [employer], published by [employer], 2009.    Squat    Standing, squeeze pelvic floor and hold. Squat. Relax. When you squat.  Copyright  VHI. All rights reserved.  Housework - Bed    Use light bedding, such as a down comforter. Place one knee up on bed to reach when making bed. Use extra-depth fitted sheets, and squat down when tucking corners.   Copyright  VHI. All rights reserved.  Walking: Phase 6    While walking, squeeze pelvic floor and hold for  _10__ seconds. Rest for 10___ seconds. Repeat _10__  times. Do _3__ times a day.  Copyright  VHI. All rights reserved.  [facility] [address] [place], [place] [address] Phone # [phone] Fax [phone]

## 2017-04-18 NOTE — Therapy (Signed)
[facility] [address], [place], [place], [address] Phone: [phone]   Fax:  [phone]  Physical Therapy Treatment  Patient Details  Name: Maria Jackson MRN: [id number] Date of Birth: Nov 30, 1945 Referring Provider: Dr. Renelda Loma Armbruster  Encounter Date: 04/18/2017      PT End of Session - 04/18/17 1106    Visit Number 5   Number of Visits 10   Date for PT Re-Evaluation 05/16/17   Authorization Type medicare g-code 10th visit   PT Start Time 1015   PT Stop Time 1055   PT Time Calculation (min) 40 min   Activity Tolerance Patient tolerated treatment well   Behavior During Therapy [facility]: [facility] Rhonda W for tasks assessed/performed      Past Medical History:  Diagnosis Date  . Allergy   . Arthritis    knees  . 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 Cancer ([place]) 11/05/11   SQUAMOS CELL CARCINOMA - LEFT CALF  . Cataract   . Chronic lower back pain   . Concussion   . Diplopia    left eye - when she looks down  . Head injury, closed, with concussion   . Insomnia    NO LONGER AN ISSUE  . 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 Osteopenia   . Osteoporosis   . Raynaud disease   . Seasonal allergies   . Spondylolisthesis    lumbar    Past Surgical History:  Procedure Laterality Date  . APPENDECTOMY    . BREAST LUMPECTOMY WITH RADIOACTIVE SEED LOCALIZATION Right 07/14/2016   Procedure: RIGHT BREAST LUMPECTOMY WITH RADIOACTIVE SEED LOCALIZATION;  Surgeon: Autumn Messing III, MD;  Location: [facility];  Service: General;  Laterality: Right;  . CHOLECYSTECTOMY    . COLONOSCOPY    . ENDOSCOPIC VEIN LASER TREATMENT     calf riht and left  . FOOT NEUROMA SURGERY     bilateral  . JOINT REPLACEMENT    . TONSILLECTOMY    . TOTAL KNEE ARTHROPLASTY Left 04/27/2013   Procedure: LEFT TOTAL KNEE ARTHROPLASTY;  Surgeon: Gearlean Alf, MD;  [facility];  Service:  Orthopedics;  Laterality: Left;  . TOTAL KNEE ARTHROPLASTY Right 08/27/2013   Procedure: RIGHT TOTAL KNEE ARTHROPLASTY;  Surgeon: Gearlean Alf, MD;  [facility];  Service: Orthopedics;  Laterality: Right;  . TUBAL LIGATION    . VAGINAL HYSTERECTOMY     tah/bso  . WISDOM TOOTH EXTRACTION      There were no vitals filed for this visit.      Subjective Assessment - 04/18/17 1023    Subjective I went away on vacation. I have not done exercises as well due vacation.  I can tell I have not done my program so I regressed. I am back  on track.    Pertinent History s/p breast cancer on 06/09/2016 was estrogen positive with radiation for treatment   Patient Stated Goals work on rectal prolpase, decreased urinary leakage with cough or laugh, sneezing; get back on exercise program   Currently in Pain? No/denies                         [facility] Adult PT Treatment/Exercise - 04/18/17 0001      Therapeutic Activites    Therapeutic Activities Other Therapeutic Activities   Other Therapeutic Activities instruction on pelvic floor contraction and correct body mechanics with lifting, making  her bed, how to exercise, and how to not strain her pelvic floor with daily activities     Neuro Re-ed    Neuro Re-ed Details  squat with pelvic floor contraction, walking with pelvic floor contraction                PT Education - 04/18/17 1050    Education provided Yes   Education Details lifting, taking care of pelvic floor, squat and walk with pelvic floor contraction   Person(s) Educated Patient   Methods Explanation;Demonstration;Verbal cues;Handout   Comprehension Returned demonstration;Verbalized understanding          PT Short Term Goals - 03/16/17 1020      PT SHORT TERM GOAL #1   Title independent with initial HEP   Time 4   Period Weeks   Status Achieved     PT SHORT TERM GOAL #2   Title pelvic floor strength is >/= 3/5 to reduce fecal incontinence    Time 4    Period Weeks   Status Achieved     PT SHORT TERM GOAL #3   Title understand how to manage vaginal dryness with lubricants and moisturizers   Time 4   Period Weeks   Status Achieved           PT Long Term Goals - 04/18/17 1028      PT LONG TERM GOAL #1   Title independent with HEP and understand how to progress herself   Baseline still learning   Time 8   Period Weeks   Status On-going     PT LONG TERM GOAL #2   Title reports no rectal prolapse since intial evaluation due to pelvic floor strength >/= 4/5   Baseline started to notice when she did not do her exercises   Time 8   Period Weeks   Status On-going     PT LONG TERM GOAL #3   Title fecal leakage decreased >/= 75% due to increased strength and tone of the pelvic floor   Baseline decreased by 70% better,    Time 8   Period Weeks   Status On-going     PT LONG TERM GOAL #4   Title understand how to left and exercise with pelvic floor contraction and no bearing down due to no valsalva manuever   Time 8   Period Weeks   Status On-going               Plan - 04/18/17 1054    Clinical Impression Statement Patient reports fecal incontinence decreased by 70%.  Patient reports her pelvic floor strength decreased due to her not doing her exercises consistently.  Patient understands ways to not stain her pelvic floor with daily activities and lifting.  Patient has not changes in strength  since last visit.  Patient will benfit from skilled therapy to improve pelvic floor strength to decrease fecal and urinary leakage.    Rehab Potential Excellent   Clinical Impairments Affecting Rehab Potential s/p breast cancer   PT Frequency 1x / week   PT Duration 8 weeks   PT Treatment/Interventions Therapeutic activities;Therapeutic exercise;Neuromuscular re-education;Patient/family education;Manual techniques;Biofeedback   PT Next Visit Plan progress pelvic floor strength with EMG with hip strength, squat, lift   PT Home  Exercise Plan progress as needed   Recommended Other Services MD signed renewal   Consulted and Agree with Plan of Care Patient      Patient will benefit from skilled therapeutic intervention in order to  improve the following deficits and impairments:  Decreased coordination, Increased fascial restricitons, Impaired tone, Decreased endurance, Increased muscle spasms, Decreased activity tolerance, Decreased mobility, Decreased strength  Visit Diagnosis: Muscle weakness (generalized)  Unspecified lack of coordination  Other muscle spasm     Problem List Patient Active Problem List   Diagnosis Date Noted  . Osteoporosis 06/20/2016  . Malignant neoplasm of lower-inner quadrant of right breast of female, estrogen receptor positive ([place]) 06/11/2016  . Cephalalgia 03/04/2016  . Diplopia 09/02/2015  . Neck pain on right side 09/02/2015  . Balance disorder 06/05/2015  . Neck pain 05/15/2015  . Headache 05/15/2015  . Postoperative anemia due to acute blood loss 08/28/2013  . OA (osteoarthritis) of knee 04/27/2013  . 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 Vaginal atrophy 05/25/2012  . CONSTIPATION 08/27/2009  . ULCERATION OF INTESTINE 08/27/2009    Earlie Counts, PT 04/18/17 11:07 AM   [facility] [address] [place], [place], [address] Phone: [phone]   Fax:  [phone]  Name: Maria Jackson MRN: [id number] Date of Birth: [birth year]

## 2017-05-02 ENCOUNTER — Encounter: Payer: Self-pay | Admitting: Physical Therapy

## 2017-05-02 ENCOUNTER — Ambulatory Visit: Payer: Medicare Other | Admitting: Physical Therapy

## 2017-05-02 DIAGNOSIS — R279 Unspecified lack of coordination: Secondary | ICD-10-CM

## 2017-05-02 DIAGNOSIS — M6281 Muscle weakness (generalized): Secondary | ICD-10-CM

## 2017-05-02 DIAGNOSIS — M62838 Other muscle spasm: Secondary | ICD-10-CM

## 2017-05-02 NOTE — Patient Instructions (Signed)
Elevator (Sitting)    Sitting, imagine pelvic floor with 2 levels. "Going up", contract pelvic floor and Hold for _3__ seconds on each level. Then "going down", contract pelvic floor and Hold for _3__ seconds on each level. Relax. Repeat _5__ times. Do _2__ times a day.  Copyright  VHI. All rights reserved.   [facility] [address] [place],  [address] Phone # [phone] Fax [phone]

## 2017-05-02 NOTE — Therapy (Signed)
[facility] [address] [place], [place], [address] Phone: [phone]   Fax:  [phone]  Physical Therapy Treatment  Patient Details  Name: Maria Jackson MRN: [id number] Date of Birth: Sep 18, 1945 Referring Provider: Dr. Renelda Loma Armbruster  Encounter Date: 05/02/2017      PT End of Session - 05/02/17 1020    Visit Number 6   Number of Visits 10   Date for PT Re-Evaluation 05/16/17   Authorization Type medicare g-code 10th visit   PT Start Time 1015   PT Stop Time 1055   PT Time Calculation (min) 40 min   Activity Tolerance Patient tolerated treatment well   Behavior During Therapy [facility] for tasks assessed/performed      Past Medical History:  Diagnosis Date  . Allergy   . Arthritis    knees  . 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 Cancer ([place]) 11/05/11   SQUAMOS CELL CARCINOMA - LEFT CALF  . Cataract   . Chronic lower back pain   . Concussion   . Diplopia    left eye - when she looks down  . Head injury, closed, with concussion   . Insomnia    NO LONGER AN ISSUE  . Malignant neoplasm of lower-inner quadrant of right female breast ([place]) 06/11/2016  . Osteopenia   . Osteoporosis   . Raynaud disease   . Seasonal allergies   . Spondylolisthesis    lumbar    Past Surgical History:  Procedure Laterality Date  . APPENDECTOMY    . BREAST LUMPECTOMY WITH RADIOACTIVE SEED LOCALIZATION Right 07/14/2016   Procedure: RIGHT BREAST LUMPECTOMY WITH RADIOACTIVE SEED LOCALIZATION;  Surgeon: Autumn Messing III, MD;  Location: [facility];  Service: General;  Laterality: Right;  . CHOLECYSTECTOMY    . COLONOSCOPY    . ENDOSCOPIC VEIN LASER TREATMENT     calf riht and left  . FOOT NEUROMA SURGERY     bilateral  . JOINT REPLACEMENT    . TONSILLECTOMY    . TOTAL KNEE ARTHROPLASTY Left 04/27/2013   Procedure: LEFT TOTAL KNEE ARTHROPLASTY;  Surgeon: Gearlean Alf, MD;  Location: [facility];  Service:  Orthopedics;  Laterality: Left;  . TOTAL KNEE ARTHROPLASTY Right 08/27/2013   Procedure: RIGHT TOTAL KNEE ARTHROPLASTY;  Surgeon: Gearlean Alf, MD;  Location: [facility];  Service: Orthopedics;  Laterality: Right;  . TUBAL LIGATION    . VAGINAL HYSTERECTOMY     tah/bso  . WISDOM TOOTH EXTRACTION      There were no vitals filed for this visit.      Subjective Assessment - 05/02/17 1019    Subjective I had some leakage this weekend.  We have been walking. As I walk I am concentrating on holding my pelvic floor for 10 seconds.    Pertinent History s/p breast cancer on 06/09/2016 was estrogen positive with radiation for treatment   Patient Stated Goals work on rectal prolpase, decreased urinary leakage with cough or laugh, sneezing; get back on exercise program   Currently in Pain? No/denies                      Pelvic Floor Special Questions - 05/02/17 0001    Biofeedback resting tone in sitting is o.9 uv; sitting with 10 second contraction above 20uv; Quick contraction in sitting is 45 uv ;    Biofeedback sensor type Surface  rectum   Biofeedback Activity Quick contraction;10 second hold;Other  steps;                    PT Education - 05/02/17 1049    Education provided Yes   Education Details elevator pelvic floor contration in sitting   Person(s) Educated Patient   Methods Explanation;Tactile cues;Verbal cues;Handout   Comprehension Verbalized understanding;Returned demonstration          PT Short Term Goals - 03/16/17 1020      PT SHORT TERM GOAL #1   Title independent with initial HEP   Time 4   Period Weeks   Status Achieved     PT SHORT TERM GOAL #2   Title pelvic floor strength is >/= 3/5 to reduce fecal incontinence    Time 4   Period Weeks   Status Achieved     PT SHORT TERM GOAL #3   Title understand how to manage vaginal dryness with lubricants and moisturizers   Time 4   Period Weeks   Status Achieved           PT Long  Term Goals - 05/02/17 1031      PT LONG TERM GOAL #1   Title independent with HEP and understand how to progress herself   Baseline still learning   Time 8   Period Weeks   Status On-going     PT LONG TERM GOAL #2   Title reports no rectal prolapse since intial evaluation due to pelvic floor strength >/= 4/5   Baseline started to notice when she did not do her exercises; irritation on the intestines    Time 8   Period Weeks   Status On-going     PT LONG TERM GOAL #3   Title fecal leakage decreased >/= 75% due to increased strength and tone of the pelvic floor   Baseline decreased by 70% better,    Time 8   Period Weeks   Status On-going     PT LONG TERM GOAL #4   Title understand how to left and exercise with pelvic floor contraction and no bearing down due to no valsalva manuever   Time 8   Period Weeks   Status On-going               Plan - 05/02/17 1021    Clinical Impression Statement Patient has good resting tone of the pelvic floor in sitting.  Patient is able to contract above 20 uv in sitting with 10 second contraction.  Patient is able to do quick flicks to 40 uv.  Patient has some difficulty with pelvic floor contraction with progressive strength.  Patient will benefit from skilled therapy to improve pelvic floor strength to decrease fecal and urinary leakage.    Rehab Potential Excellent   Clinical Impairments Affecting Rehab Potential s/p breast cancer   PT Frequency 1x / week   PT Duration 8 weeks   PT Treatment/Interventions Therapeutic activities;Therapeutic exercise;Neuromuscular re-education;Patient/family education;Manual techniques;Biofeedback   PT Next Visit Plan progress pelvic floor strength with EMG squat, lift   PT Home Exercise Plan progress as needed   Consulted and Agree with Plan of Care Patient      Patient will benefit from skilled therapeutic intervention in order to improve the following deficits and impairments:  Decreased  coordination, Increased fascial restricitons, Impaired tone, Decreased endurance, Increased muscle spasms, Decreased activity tolerance, Decreased mobility, Decreased strength  Visit Diagnosis: Muscle weakness (generalized)  Unspecified lack of coordination  Other muscle spasm     Problem  List Patient Active Problem List   Diagnosis Date Noted  . Osteoporosis 06/20/2016  . Malignant neoplasm of lower-inner quadrant of right breast of female, estrogen receptor positive ([place]) 06/11/2016  . Cephalalgia 03/04/2016  . Diplopia 09/02/2015  . Neck pain on right side 09/02/2015  . Balance disorder 06/05/2015  . Neck pain 05/15/2015  . Headache 05/15/2015  . Postoperative anemia due to acute blood loss 08/28/2013  . OA (osteoarthritis) of knee 04/27/2013  . 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 Vaginal atrophy 05/25/2012  . CONSTIPATION 08/27/2009  . ULCERATION OF INTESTINE 08/27/2009    Earlie Counts, PT 05/02/17 10:55 AM   [facility] [address], [place], [place], [address] Phone: [phone]   Fax:  [phone]  Name: MAXWELL MARTORANO MRN: [id number] Date of Birth: [birth year]

## 2017-05-13 ENCOUNTER — Encounter: Payer: Self-pay | Admitting: Physical Therapy

## 2017-05-13 ENCOUNTER — Ambulatory Visit: Payer: Medicare Other | Attending: Gastroenterology | Admitting: Physical Therapy

## 2017-05-13 ENCOUNTER — Other Ambulatory Visit: Payer: Self-pay

## 2017-05-13 DIAGNOSIS — M6281 Muscle weakness (generalized): Secondary | ICD-10-CM

## 2017-05-13 DIAGNOSIS — R279 Unspecified lack of coordination: Secondary | ICD-10-CM | POA: Diagnosis present

## 2017-05-13 DIAGNOSIS — M62838 Other muscle spasm: Secondary | ICD-10-CM

## 2017-05-13 NOTE — Therapy (Signed)
[facility] [address] [place], [place], [address] Phone: [phone]   Fax:  [phone]  Physical Therapy Treatment  Patient Details  Name: Maria Jackson MRN: [id number] Date of Birth: April 21, 1946 Referring Provider: Dr. Renelda Loma Armbruster   Encounter Date: 05/13/2017  PT End of Session - 05/13/17 1127    Visit Number  7    Number of Visits  10    Date for PT Re-Evaluation  05/16/17    Authorization Type  medicare g-code 10th visit    PT Start Time  9381    PT Stop Time  1045    PT Time Calculation (min)  43 min    Activity Tolerance  Patient tolerated treatment well    Behavior During Therapy  [facility] for tasks assessed/performed       Past Medical History:  Diagnosis Date  . Allergy   . Arthritis    knees  . 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 Cancer ([place]) 11/05/11   SQUAMOS CELL CARCINOMA - LEFT CALF  . Cataract   . Chronic lower back pain   . Concussion   . Diplopia    left eye - when she looks down  . Head injury, closed, with concussion   . Insomnia    NO LONGER AN ISSUE  . 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 Osteopenia   . Osteoporosis   . Raynaud disease   . Seasonal allergies   . Spondylolisthesis    lumbar    Past Surgical History:  Procedure Laterality Date  . APPENDECTOMY    . CHOLECYSTECTOMY    . COLONOSCOPY    . ENDOSCOPIC VEIN LASER TREATMENT     calf riht and left  . FOOT NEUROMA SURGERY     bilateral  . JOINT REPLACEMENT    . TONSILLECTOMY    . TUBAL LIGATION    . VAGINAL HYSTERECTOMY     tah/bso  . WISDOM TOOTH EXTRACTION      There were no vitals filed for this visit.  Subjective Assessment - 05/13/17 1006    Subjective  I am doing well. Prolapse improved by 80%.  Fecal leakage decreased by 80%.     Pertinent History  s/p breast cancer on 06/09/2016 was estrogen positive with radiation for treatment     Patient Stated Goals  work on rectal prolpase, decreased urinary leakage with cough or laugh, sneezing; get back on exercise program    Currently in Pain?  No/denies         [facility] PT Assessment - 05/13/17 0001      Assessment   Medical Diagnosis  K62.3 rectal prolapse    Onset Date/Surgical Date  08/05/16    Prior Therapy  None for pelvic floor      Precautions   Precautions  Other (comment)    Precaution Comments  breast cancer       Restrictions   Weight Bearing Restrictions  No      Balance Screen   Has the patient fallen in the past 6 months  No    Has the patient had a decrease in activity level because of a fear of falling?   No    Is the patient reluctant to leave their home because of a fear of falling?   No      Home Film/video editor residence  Prior Function   Level of Independence  Independent    Vocation  Retired    Leisure  used to exercise at [employer]   Overall Cognitive Status  Within Functional Limits for tasks assessed      Observation/Other Assessments   Focus on Therapeutic Outcomes (FOTO)   functional limitation as per therapist discretion is 30% limited goal is 20% limitation      Posture/Postural Control   Posture/Postural Control  No significant limitations      ROM / Strength   AROM / PROM / Strength  Strength      AROM   Overall AROM   Within functional limits for tasks performed      Strength   Right Hip Flexion  5/5    Right Hip Extension  5/5    Right Hip ABduction  4+/5    Left Hip Flexion  5/5    Left Hip Extension  5/5    Left Hip ABduction  4+/5                          PT Education - 05/13/17 1134    Education provided  Yes    Education Details  information on osteoporosis; how to progress her pelvic floor exercises to standing; how to contract pelvic floor for longer periods of time; body mechanics to decrease strain on the pelvic floor; walking tall on ground  and stairs;     Person(s) Educated  Patient    Methods  Explanation;Demonstration;Handout    Comprehension  Returned demonstration;Verbalized understanding       PT Short Term Goals - 03/16/17 1020      PT SHORT TERM GOAL #1   Title  independent with initial HEP    Time  4    Period  Weeks    Status  Achieved      PT SHORT TERM GOAL #2   Title  pelvic floor strength is >/= 3/5 to reduce fecal incontinence     Time  4    Period  Weeks    Status  Achieved      PT SHORT TERM GOAL #3   Title  understand how to manage vaginal dryness with lubricants and moisturizers    Time  4    Period  Weeks    Status  Achieved        PT Long Term Goals - 05/13/17 1105      PT LONG TERM GOAL #1   Title  independent with HEP and understand how to progress herself    Time  8    Period  Weeks    Status  Achieved      PT LONG TERM GOAL #2   Title  reports no rectal prolapse since intial evaluation due to pelvic floor strength >/= 4/5    Baseline  80% better    Time  8    Period  Weeks    Status  Partially Met      PT LONG TERM GOAL #3   Title  fecal leakage decreased >/= 75% due to increased strength and tone of the pelvic floor    Time  8    Period  Weeks    Status  Achieved      PT LONG TERM GOAL #4   Title  understand how to left and exercise with pelvic floor contraction and no bearing down due to no  valsalva manuever    Time  8    Period  Weeks    Status  Achieved            Plan - 06-04-17 1107    Clinical Impression Statement  Patient has met all of her goals.  Patient hip strength has increased. Patient reports her fecal leakage and prolapse is 80% better.  Patient is independent with her HEP.  Patient does not have issues with her constipation using the abdomimal massage and correct toileting technique. Patient has increased control of the pelvic floor.  Patient is ready for discharge.     Rehab Potential  Excellent    Clinical Impairments Affecting Rehab Potential   s/p breast cancer    PT Treatment/Interventions  Therapeutic activities;Therapeutic exercise;Neuromuscular re-education;Patient/family education;Manual techniques;Biofeedback    PT Next Visit Plan  Discharge to current HEP    PT Home Exercise Plan  Current HEP    Consulted and Agree with Plan of Care  Patient       Patient will benefit from skilled therapeutic intervention in order to improve the following deficits and impairments:  Decreased coordination, Increased fascial restricitons, Impaired tone, Decreased endurance, Increased muscle spasms, Decreased activity tolerance, Decreased mobility, Decreased strength  Visit Diagnosis: Muscle weakness (generalized)  Unspecified lack of coordination  Other muscle spasm   G-Codes - 04-Jun-2017 1106    Functional Assessment Tool Used (Outpatient Only)  therapist discretion due to urinary and fecal leakage 20% limitation    Functional Limitation  Other PT primary    Other PT Primary Goal Status (T2671)  At least 20 percent but less than 40 percent impaired, limited or restricted    Other PT Primary Discharge Status (I4580)  At least 20 percent but less than 40 percent impaired, limited or restricted       Problem List Patient Active Problem List   Diagnosis Date Noted  . Osteoporosis 06/20/2016  . Malignant neoplasm of lower-inner quadrant of right breast of female, estrogen receptor positive ([place]) 06/11/2016  . Cephalalgia 03/04/2016  . Diplopia 09/02/2015  . Neck pain on right side 09/02/2015  . Balance disorder 06/05/2015  . Neck pain 05/15/2015  . Headache 05/15/2015  . Postoperative anemia due to acute blood loss 08/28/2013  . OA (osteoarthritis) of knee 04/27/2013  . 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 Vaginal atrophy 05/25/2012  . CONSTIPATION 08/27/2009  . ULCERATION OF INTESTINE 08/27/2009    Earlie Counts, PT Jun 04, 2017 11:38 AM    [facility] [address], [place], [place], [address] Phone: [phone]   Fax:  [phone]  Name: Maria Jackson MRN: [id number] Date of Birth: [birth year]  PHYSICAL THERAPY DISCHARGE SUMMARY  Visits from Start of Care: 7  Current functional level related to goals / functional outcomes: See above.    Remaining deficits: See above.    Education / Equipment: HEP Plan: Patient agrees to discharge.  Patient goals were met. Patient is being discharged due to meeting the stated rehab goals.  Thank you for the referral Earlie Counts, PT 06-04-2017 11:38 AM  ?????

## 2017-05-13 NOTE — Patient Instructions (Addendum)
ABDUCTION: Standing (Active)    Stand, feet flat. Lift right leg out to side. Tighten pelvic floor.  Complete __1_ sets of _10__ repetitions. Perform __1_ sessions per day.  http://gtsc.exer.us/111   Copyright  VHI. All rights reserved.  Positioning on Side in Bed    Place pillow, long enough to support knees and feet, between legs. Pillow under head and/or neck to protect alignment of this area. Place pillow in front for support of upper arm. Consider use of a body pillow. Avoid "fetal" position.  Copyright  VHI. All rights reserved.  Coughing, Sneezing    Stabilize body by bending knees slightly and placing one hand on abdomen or in small of back to help stabilize your back. Alternatively, hold onto a kitchen counter, table or some sturdy object to minimize sudden bending forces on the back.   Copyright  VHI. All rights reserved.  Eating    Sit, protecting natural arch in low back. Bring food to mouth, not mouth to food. Do not lean on elbows or arms.  Copyright  VHI. All rights reserved.  Getting Into and Out of Bed    Sit on edge of bed, feet on floor. Lie down sideways. Roll over onto back. Keep back straight. Use hand and elbow to lower and raise trunk. Move shoulders and hips at same rate to avoid twisting the back. To get out of bed, reverse movement. Breath out while getting out of bed to decrease strain on the pelvic floor.    Copyright  VHI. All rights reserved.   Relaxation Exercises with the Urge to Void   When you experience an urge to void:  FIRST  Stop and stand very still    Sit down if you can    Don't move    You need to stay very still to maintain control  SECOND Squeeze your pelvic floor muscles 5 times, like a quick flick, to keep from leaking  THIRD Relax  Take a deep breath and then let it out  Try to make the urge go away by using relaxation and visualization techniques  FINALLY When you feel the urge go away somewhat, walk  normally to the bathroom.   If the urge gets suddenly stronger on the way, you may stop again and relax to regain control.  Yard Work: Raking    DO: Keep rake close to body. Keep back straight. Minimize bending and rotation of the back. Bend knees to avoid back strain. Rake with short motions. Keep one foot ahead of the other for back and forth motions. Walk over to task. DON'T: Reach with rake.  Copyright  VHI. All rights reserved.  Going Up Steps    Stay close to rail. Back straight, chest up. Push, don't pull, with hand on rail. Push with knee of upper leg as you go up the step. Contract pelvic pelvic floor.   Copyright  VHI. All rights reserved.                                             DO's and DON'T's   Avoid and/or Minimize positions of forward bending ( flexion)  Side bending and rotation of the trunk  Especially when movements occur together   When your back aches:   Don't sit down   Lie down on your back with a small pillow under your head and one  under your knees or as outlined by our therapist. Or, lie in the 90/90 position ( on the floor with your feet and legs on the sofa with knees and hips bent to 90 degrees)  Tying or putting on your shoes:   Don't bend over to tie your shoes or put on socks.  Instead, bring one foot up, cross it over the opposite knee and bend forward (hinge) at the hips to so the task.  Keep your back straight.  If you cannot do this safely, then you need to use long handled assistive devices such as a shoehorn and sock puller.  Exercising:  Don't engage in ballistic types of exercise routines such as high-impact aerobics or jumping rope  Don't do exercises in the gym that bring you forward (abdominal crunches, sit-ups, touching your  toes, knee-to-chest, straight leg raising.)  Follow a regular exercise program that includes a variety of different weight-bearing activities, such as low-impact aerobics, T' ai chi or walking as  your physical therapist advises  Do exercises that emphasize return to normal body alignment and strengthening of the muscles that keep your back straight, as outlined in this program or by your therapist  Household tasks:  Don't reach unnecessarily or twist your trunk when mopping, sweeping, vacuuming, raking, making beds, weeding gardens, getting objects ou of cupboards, etc.  Keep your broom, mop, vacuum, or rake close to you and mover your whole body as you move them. Walk over to the area on which you are working. Arrange kitchen, bathroom, and bedroom shelves so that frequently used items may be reached without excessive bending, twisting, and reaching.  Use a sturdy stool if necessary.  Don't bend from the waist to pick up something up  Off the floor, out of the trunk of your car, or to brush your teeth, wash your face, etc.   Bend at the knees, keeping back straight as possible. Use a reacher if necessary.   Prevention of fracture is the so-called "BOTTOm -Line" in the management of OSTEOPOROSIS. Do not take unnecessary chances in movement. Once a compression fracture occurs, the process is very difficult to control; one fracture is frequently followed by many more.    Positioning on Back in Bed    Place pillow under knees. Use towel roll or pillow under neck and/or head if needed. Any pillow under head and/or neck should be as small as possible but enough to protect alignment of this area.  Copyright  VHI. All rights reserved.  Getting Dressed    "Perch" on edge of chair to protect natural arch of lower back. Bring foot up to rest on other knee to put on socks and shoes and to tie shoes or use small stool to support foot. If you cannot do this activity safely, you need elastic shoelaces or Velcro closures and a long-handled shoehorn. Avoid bending over to tie shoes.  Copyright  VHI. All rights reserved.  Making the Bed    Kneel down on one knee when tucking in the  sheets and blankets. Do not reach across the bed. Use a reacher to smooth out the bedspread. Consider using a lightweight comforter instead of a bedspread. Do not turn your mattress. Get someone to do it for you.   Copyright  VHI. All rights reserved.  Pushing vs Pulling    Pushing is preferable to pulling. Keep back straight and use large leg muscles. Keep elbows either at sides or straight so leg and back muscles do the  work . When pushing a wheelchair, stay close to the chair.   Copyright  VHI. All rights reserved.  Walk Taller    Pretend that you are 2-4 inches taller than you think or have been told you are. "Attach" a string, rope, golden thread, or steel cable to the top of your head to help pull you up. Pretend that you are a puppet being held up by this string.   Copyright  VHI. All rights reserved.  Think Taller    Stand 2-4 inches taller. Imagine a string, golden thread, rope, or steel cable pulling up at the crown of the head. Avoid tilting head back - keep chin parallel to floor. Do not shrug shoulders. Practice frequently during the day.   Copyright  VHI. All rights reserved.     Osteoporosis   What is Osteoporosis?  - A silent disease in which the skeleton is weakened by decreased bone density. - Characterized by low bone mass, deterioration of bone, and increased risk of fracture postmenopausal (primary) or the result of an identifiable condition/event (secondary) - Commonly found in the wrists, spine, and hips; these are high-risk stress areas and very susceptible to fractures.  The Facts: - There are 1.5 million fractures/year o 500,000 spine; 250,000 hip with over 60,000 nursing home admissions secondary to hip fracture; and 200,000 wrist - After hip fracture, only 50% of people able to walk independently prior to the fracture return to independent ambulation. - Bone mass: Peaks at age 13-30, and begins declining at age 35-50.   Osteoporosis is  defined by the [facility]) as:  NOF/WHO Criteria for Interpreting Results of Bone Density Assessment  Results Diagnosis  Within 1 standard deviation (SD) of young adult mean Normal  Between 1 and -2.5 SD below mean, repeat in 2 years Low bone mass (osteopenia)  Greater than -2.5 SD below mean Osteoporosis  Greater than -2.5 SD below mean and one or more fragility fractures exist Severe Osteoporosis  *Results can be affected by positioning of the body in the DEXA scan, presence of current or old fractures, arthritis, extraneous calcifications.    Osteoporosis is not just a women's disease!  - 30-40% of women will develop osteoporosis - 5-15% of males will develop osteoporosis   What are the risk factors?  1. Female 2. Thin, small frame 3. Caucasian, Asian race 4. Early menopause (<37 years old)/amenorrhea/delayed puberty 28. Old age 23. Family history (fractures, stooped posture)\ 7. Low calcium diet 8. Sedentary lifestyle 9. Alcohol, Caffeine, Smoking 10. Malnutrition, GI Disease 11. Prolonged use of Glucocorticoids (Prednisone), Meds to treat asthma, arthritis, cancers, thyroid, and anti-seizure meds.  How do I know for sure?  Get a BONE DENSITY TEST!  This measures bone loss and it's painless, non-invasive, and only takes 5-10 minutes!  What can I do about it?  ? Decrease your risk factors (alcohol, caffeine, smoking) ? Helpful medications (see next page) ? Adequate Calcium and Vitamin D intake ? Get active! o Proper posture - Sit and stand tall! No slouching or twisting o Weight-Bearing Exercise - walking, stair climbing, elliptical; NO jogging or high-impact exercise. o Resistive Exercise - Cybex weight equipment, Nautilus, dumbbells, therabands  **Be sure to maintain proper alignment when lifting any weight!!  **When using equipment, avoid abdominal exercises which involve "crunching" or curling or twisting the trunk, biceps machines,  cross-country machines, moving handlebars, or ANY MACHINE WITH ROTATION OR FORWARD BENDING!!!           Approved  Pharmacologic Management of Osteoporosis  Agent Approved for prevention Approved for treatment BMD increased spine/hip Fracture reduction  Estrogen/Hormone Therapy (Estrace, Estratab, Ogen, Premarin, Vivell, Prempro, Femhert, Orthoest) Yes Yes 3-6% 35% spine and hip  Bisphosphonates  (Fosamax, Actonel, Boniva) Yes Yes 3-8% 35-50% spine and non-spine  Calcitonin (Miacalcin, Calcimar, Fortical) No Yes 0-3% None stated  Raloxifene (Evista) Yes Yes 2-3% 30-55%  Parathyroid Hormone (Forteo) No Yes, only in those at high risk for fracture None stated 53-65%     Recommended Daily Calcium Intakes   Population Group NIH/NOF* (mg elemental calcium)  Children 1-10 years [phone]  Children 11-24 years 73-1500  Men and Women 25-64 years At least 1200  Pregnant/Lactating At least 1200  Postmenopausal women with hormone replacement therapy At least 1200  Postmenopausal women without   hormone replacement therapy At least 1200  Men and women 65 + At least 1200  *In 1987, 1990, 1994, and 2000, the NIH held consensus conferences on osteoporosis and calcium.  This column shows the most recent recommendations regarding calcium intak for preventing and managing osteoporosis.          Calcium Content of Selected Foods  Dairy Foods Calcium Content (mg) Non-Dairy Foods Calcium Content (mg)  Buttermilk, 1 cup 300 Calcium-fortified juice, 1 cup 300  Milk, 1 cup 300 Salmon, canned with Bones, 2 oz 100  Lactaid milk, 1 cup 300-500 Oysters, raw 13-19 medium 226  Soy milk, 1 cup 200-300 Sardines, canned with bones, 3 oz 372  Yogurt (plain, lowfat) 1 cup 250-300 Shrimp, canned 3 oz 98  Frozen yogurt (fruit) 1 cup 200-600 Collard greens, cooked 1 cup 357  Cheddar, mozzarella, or [place] cheese, 1oz 205 Broccoli, cooked 1 cup 78  Cottage cheese (lowfat) 4 oz 200  Soybeans, cooked 1 cup 131  Part-skim ricotta cheese, 4oz 335 Tofu, 4oz* *  Vanilla ice cream, 1 cup 120-300    *Calcium content of tofu varies depending on processing method; check nutritional label on package for precise calcium content.     Suggested Guidelines for Calcium Supplement Use:  ? Calcium is absorbed most efficiently if taken in small amounts throughout the day.  Always divide the daily dose into smaller amounts if the total daily dose is 500mg  or more per day.  The body cannot use more than 500mg  Calcium at any one time. ? The use of manufactured supplements is encouraged.  Calcium as bone meal or dolomite may contain lead or other heavy metals as contaminants. ? Calcium supplements should not be taken with high fiber meals or with bulk forming laxatives. ? If calcium carbonate is used as the supplement form, it should be taken with meals to assure that stomach acid production is present to facilitate optimal dissolution and absorption of calcium.  This is important if atrophic gastritis with hypo- or achlorhydria is present, which it is in 20-50% of older individuals. ? It is important to drink plenty of fluids while using the supplement to help reduce problems with side effects like constipation or bloating.  If these symptoms become a problem, switching to another form of supplement may be the answer. ? Another alternative is calcium-fortified foods, including fruit juices, cereals, and breads.  These foods are now marketed with added calcium and may be less likely to cause side effects. ? Those with personal or family histories of kidney stones should be monitored to assure that hypercalcuria does not occur. CALCIUM INTAKE QUIZ  Dairy products are the primary source of calcium for most  people.  For a quick estimate of your daily calcium intake, complete the following steps:  1. Use the chart below to determine your daily intake of calcium from diary foods. Servings of dairy  per day 1 2 3 4 5 6 7 8   Milligrams (mg) of calcium: 250 [phone] 1250 1500 1750 2000   2.  Enter your total daily calcium intake from dairy foods:     _____mg  3.  Add 350 mg, which is the average for all other dietary sources:                 +            350 mg  4.  The sum of your total daily calcium intake:               ______mg  5.  Enter the recommended calcium intake for your age from the chart below;         ______mg  6.  Enter your daily intake from step 4 above and subtract:                             -        _______mg  7.  The result is how much additional calcium you need:                                          ______mg      Recommended Daily Calcium Intake  Population Calcium (mg)  Children 1-10 years [phone]  Children 11-24 years 92-1500  Men and women 25-64 At least 1200  Pregnant/Lactating At least 1200  Postmenopausal women with hormone replacement therapy At least 1200  Postmenopausal women without hormone replacement therapy At least 1200  Men and women 65+ At least 1200       SAFETY TIPS FOR FALL PREVENTION   1. Remove throw rugs and make certain carpet edges are securely fastened to the floor.  2. Reduce clutter, especially in traffic areas of the home.   3. Install/maintain sturdy handrails at stairs.  4. Increase wattage of lighting in hallways, bathrooms, kitchens, stairwells, and entrances to home.  5. Use night-lights near bed, in hallways, and in bathroom to improve night safety.  6. Install safety handrails in shower, tub, and around toilet.  Bathtubs and shower stalls should have non-skid surfaces.  7. When you must reach for something high, use a safety step stool, one with wide steps and a friction surface to stand on.  A type equipped with a high handrail is preferred.  8. If a cane or other walking aid has been recommended, use it to help increase your stability.  9. Wear supportive, cushioned, low-heeled shoes.   Avoid "scuffs" (backless bedroom slippers) and high heels.  10. Avoid rushing to answer a phone, doorbell, or anything else!  A portable phone that you can take from room to room with you is a good idea for security and safety.  11. Exercise regularly and stay active!!    [place] www.NOF.org   Exercise for Osteoporosis; A Safe and Effective Way to Build Bone Density and Muscle Strength By: Burnard Hawthorne, M.A.

## 2017-05-16 ENCOUNTER — Encounter: Payer: Medicare Other | Admitting: Physical Therapy

## 2017-06-08 DIAGNOSIS — Z853 Personal history of malignant neoplasm of breast: Secondary | ICD-10-CM | POA: Diagnosis not present

## 2017-06-08 DIAGNOSIS — R928 Other abnormal and inconclusive findings on diagnostic imaging of breast: Secondary | ICD-10-CM | POA: Diagnosis not present

## 2017-06-09 DIAGNOSIS — D0511 Intraductal carcinoma in situ of right breast: Secondary | ICD-10-CM | POA: Diagnosis not present

## 2017-06-16 DIAGNOSIS — M5136 Other intervertebral disc degeneration, lumbar region: Secondary | ICD-10-CM | POA: Diagnosis not present

## 2017-07-12 DIAGNOSIS — Z8739 Personal history of other diseases of the musculoskeletal system and connective tissue: Secondary | ICD-10-CM | POA: Diagnosis not present

## 2017-07-12 DIAGNOSIS — M51369 Other intervertebral disc degeneration, lumbar region without mention of lumbar back pain or lower extremity pain: Secondary | ICD-10-CM | POA: Insufficient documentation

## 2017-07-12 DIAGNOSIS — M4316 Spondylolisthesis, lumbar region: Secondary | ICD-10-CM | POA: Insufficient documentation

## 2017-07-12 DIAGNOSIS — M48061 Spinal stenosis, lumbar region without neurogenic claudication: Secondary | ICD-10-CM | POA: Diagnosis not present

## 2017-07-12 DIAGNOSIS — M5136 Other intervertebral disc degeneration, lumbar region: Secondary | ICD-10-CM | POA: Insufficient documentation

## 2017-07-20 DIAGNOSIS — M5136 Other intervertebral disc degeneration, lumbar region: Secondary | ICD-10-CM | POA: Diagnosis not present

## 2017-07-26 DIAGNOSIS — M545 Low back pain: Secondary | ICD-10-CM | POA: Diagnosis not present

## 2017-08-09 DIAGNOSIS — H01112 Allergic dermatitis of right lower eyelid: Secondary | ICD-10-CM | POA: Diagnosis not present

## 2017-08-09 DIAGNOSIS — H01111 Allergic dermatitis of right upper eyelid: Secondary | ICD-10-CM | POA: Diagnosis not present

## 2017-08-15 DIAGNOSIS — M810 Age-related osteoporosis without current pathological fracture: Secondary | ICD-10-CM | POA: Diagnosis not present

## 2017-08-17 DIAGNOSIS — M5136 Other intervertebral disc degeneration, lumbar region: Secondary | ICD-10-CM | POA: Diagnosis not present

## 2017-08-22 ENCOUNTER — Telehealth: Payer: Self-pay | Admitting: Vascular Surgery

## 2017-08-22 NOTE — Telephone Encounter (Signed)
Sched appt 08/23/17 at 11:00. Spoke to pt to confirm appt. Pt will contact Dr. Rolena Infante' office to get spine images.

## 2017-08-22 NOTE — Telephone Encounter (Signed)
-----   Message from Willy Eddy, RN sent at 08/19/2017  1:02 PM EST ----- Regarding: schedule appointment and call  ALIF surgery 09/21/17. Needs office appointment with Dr. Donnetta Hutching.  Thank you, B

## 2017-08-23 ENCOUNTER — Encounter: Payer: Self-pay | Admitting: Vascular Surgery

## 2017-08-23 ENCOUNTER — Ambulatory Visit: Payer: Medicare Other | Admitting: Vascular Surgery

## 2017-08-23 ENCOUNTER — Other Ambulatory Visit: Payer: Self-pay

## 2017-08-23 VITALS — BP 131/75 | HR 64 | Temp 97.8°F | Resp 16 | Ht 63.0 in | Wt 156.0 lb

## 2017-08-23 DIAGNOSIS — M5136 Other intervertebral disc degeneration, lumbar region: Secondary | ICD-10-CM | POA: Diagnosis not present

## 2017-08-23 NOTE — Progress Notes (Signed)
[facility] [place]  Patient name: Maria Jackson MRN: [id number] DOB: January 31, 1946 Sex: female  REASON FOR CONSULT: Exposure for lumbar fusion with Dr. Rolena Infante  HPI: MISHAAL Jackson is a 72 y.o. 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  She has had progressive difficulty with back pain and also pain particularly in her left leg with some weakness as well.  She is failed conservative treatment and is now had surgery recommended by Dr. Rolena Infante for improvement of her symptoms.  She has had no prior back surgery.  She has had prior abdominal surgery to include cholecystectomy with a right subcostal incision.  Also has had hysterectomy.  No other pelvic surgery.  Past Medical History:  Diagnosis Date  . Allergy   . Arthritis    knees  . 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 Cancer ([place]) 11/05/11   SQUAMOS CELL CARCINOMA - LEFT CALF  . Cataract   . Chronic lower back pain   . Concussion   . Diplopia    left eye - when she looks down  . Head injury, closed, with concussion   . Insomnia    NO LONGER AN ISSUE  . 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 Osteopenia   . Osteoporosis   . Raynaud disease   . Seasonal allergies   . Spondylolisthesis    lumbar    Family History  Problem Relation Age of Onset  . Diabetes Mother        CVA - 07/06/16  . Heart disease Mother   . Heart disease Father        Cancer of eye, Multiple Mylemona  . Colon cancer Neg Hx   . Esophageal cancer Neg Hx   . Stomach cancer Neg Hx   . Rectal cancer Neg Hx     SOCIAL HISTORY: Social History   Socioeconomic History  . Marital status: Married    Spouse name: Not on file  . Number of children: 2  . Years of education: 88  . Highest education level: Not on file  Social Needs  . Financial resource strain: Not on file  . Food insecurity -  worry: Not on file  . Food insecurity - inability: Not on file  . Transportation needs - medical: Not on file  . Transportation needs - non-medical: Not on file  Occupational History  . Occupation: Retired  Tobacco Use  . Smoking status: Never Smoker  . Smokeless tobacco: Never Used  Substance and Sexual Activity  . Alcohol use: Yes    Alcohol/week: 1.2 oz    Types: 2 Glasses of wine per week    Comment: red wine...  1/3 GLASS at dinner  . Drug use: No  . Sexual activity: Yes  Other Topics Concern  . Not on file  Social History Narrative   Lives at home with husband.   Right-handed.   No caffeine use.    Allergies  Allergen Reactions  . Bee Venom Itching, Swelling and Other (See Comments)    SWELLING REACTION UNSPECIFIED  REDNESS ITCHING  . Ibuprofen Other (See Comments)    Mouth ulcers  . Cephalexin     UNSPECIFIED REACTION   . Penicillins     Has patient had a PCN reaction causing immediate rash, facial/tongue/throat swelling, SOB or lightheadedness with hypotension:unsure Has patient had a PCN reaction causing severe rash  involving mucus membranes or skin necrosis:unsure Has patient had a PCN reaction that required hospitalization:No Has patient had a PCN reaction occurring within the last 10 years:No Reaction unknown If all of the above answers are "NO", then may proceed with Cephalosporin use.   . Sulfonamide Derivatives Rash    Current Outpatient Medications  Medication Sig Dispense Refill  . Cholecalciferol (D3-50 PO) Take by mouth.    . conjugated estrogens (PREMARIN) vaginal cream Place 1 Applicatorful vaginally 2 (two) times a week. Taking .5 of the applicator twice a week    . cycloSPORINE (RESTASIS) 0.05 % ophthalmic emulsion Place 1 drop into both eyes 2 (two) times daily.    Marland Kitchen denosumab (PROLIA) 60 MG/ML SOLN injection Inject 60 mg into the skin every 6 (six) months. Administer in upper arm, thigh, or abdomen    . desvenlafaxine (PRISTIQ) 50 MG 24 hr  tablet Take 25 mg by mouth daily.    Marland Kitchen loratadine (CLARITIN) 10 MG tablet Take 10 mg by mouth as needed for allergies.    . Lutein 6 MG CAPS Take 6 mg by mouth 2 (two) times daily.     . Methylcellulose, Laxative, (CITRUCEL PO) Take by mouth.    . nitrofurantoin, macrocrystal-monohydrate, (MACROBID) 100 MG capsule Take 100 mg by mouth daily as needed (for UTI prevention).    . pantoprazole (PROTONIX) 40 MG tablet   2  . tamoxifen (NOLVADEX) 20 MG tablet Take 20 mg by mouth daily.    Marland Kitchen triamcinolone (NASACORT AQ) 55 MCG/ACT AERO nasal inhaler Place 2 sprays into the nose daily as needed (for allergies.).     No current facility-administered medications for this visit.     REVIEW OF SYSTEMS:  [X]  denotes positive finding, [ ]  denotes negative finding Cardiac  Comments:  Chest pain or chest pressure:    Shortness of breath upon exertion:    Short of breath when lying flat:    Irregular heart rhythm:        Vascular    Pain in calf, thigh, or hip brought on by ambulation:    Pain in feet at night that wakes you up from your sleep:  x  neurogenic  Blood clot in your veins:    Leg swelling:         Pulmonary    Oxygen at home:    Productive cough:     Wheezing:         Neurologic    Sudden weakness in arms or legs:     Sudden numbness in arms or legs:     Sudden onset of difficulty speaking or slurred speech:    Temporary loss of vision in one eye:     Problems with dizziness:         Gastrointestinal    Blood in stool:     Vomited blood:         Genitourinary    Burning when urinating:     Blood in urine:        Psychiatric    Major depression:         Hematologic    Bleeding problems:    Problems with blood clotting too easily:        Skin    Rashes or ulcers:        Constitutional    Fever or chills:      PHYSICAL EXAM: Vitals:   08/23/17 1111  BP: 131/75  Pulse: 64  Resp: 16  Temp: 97.8 F (  36.6 C)  TempSrc: Oral  SpO2: 97%  Weight: 156 lb (70.8 kg)   Height: 5\' 3"  (1.6 m)    GENERAL: The patient is a well-nourished female, in no acute distress. The vital signs are documented above. CARDIOVASCULAR: 2+ radial and 2+ dorsalis pedis pulses bilaterally PULMONARY: There is good air exchange  ABDOMEN: Soft and non-tender  MUSCULOSKELETAL: There are no major deformities or cyanosis. NEUROLOGIC: No focal weakness or paresthesias are detected. SKIN: There are no ulcers or rashes noted. PSYCHIATRIC: The patient has a normal affect.  DATA:  I did review a CT scan from 2 years ago which was done for follow-up of colon cancer.  This showed no evidence of atherosclerotic change and no calcification in her aortoiliac segments.  MEDICAL ISSUES: I long discussion with the patient and her husband present.  I explained my role and mobilization of her intra-abdominal contents for spine exposure for L4-5 disc surgery.  Explained mobilization of the rectus muscle, intraperitoneal contents, left ureter and arterial and venous structures overlying the spine.  Explained the need to mobilize and ligated the iliolumbar vein.  The potential injury for all of the mobilized structures most particularly venous injury.  She does not have any evidence of arterial calcification on a CT scan from 2 years ago so I do not have any concern regarding mobile aorta.  She is not obese and is not had extensive pelvic surgery.  I feel she is appropriate anatomic candidate for L4-5 exposure and she is ready to proceed as scheduled   Rosetta Posner, MD FACS [facility] Tel [phone] Pager [phone]

## 2017-08-25 ENCOUNTER — Other Ambulatory Visit: Payer: Self-pay | Admitting: *Deleted

## 2017-08-29 DIAGNOSIS — R05 Cough: Secondary | ICD-10-CM | POA: Diagnosis not present

## 2017-08-29 DIAGNOSIS — R0981 Nasal congestion: Secondary | ICD-10-CM | POA: Diagnosis not present

## 2017-08-29 DIAGNOSIS — R198 Other specified symptoms and signs involving the digestive system and abdomen: Secondary | ICD-10-CM | POA: Insufficient documentation

## 2017-08-29 DIAGNOSIS — J310 Chronic rhinitis: Secondary | ICD-10-CM | POA: Insufficient documentation

## 2017-08-29 DIAGNOSIS — H9201 Otalgia, right ear: Secondary | ICD-10-CM | POA: Insufficient documentation

## 2017-08-29 DIAGNOSIS — K219 Gastro-esophageal reflux disease without esophagitis: Secondary | ICD-10-CM | POA: Insufficient documentation

## 2017-08-29 DIAGNOSIS — H6123 Impacted cerumen, bilateral: Secondary | ICD-10-CM | POA: Insufficient documentation

## 2017-08-29 DIAGNOSIS — H6121 Impacted cerumen, right ear: Secondary | ICD-10-CM | POA: Diagnosis not present

## 2017-08-29 DIAGNOSIS — R0989 Other specified symptoms and signs involving the circulatory and respiratory systems: Secondary | ICD-10-CM | POA: Insufficient documentation

## 2017-09-07 DIAGNOSIS — H5213 Myopia, bilateral: Secondary | ICD-10-CM | POA: Diagnosis not present

## 2017-09-12 NOTE — Pre-Procedure Instructions (Signed)
Maria Jackson  09/12/2017      [facility] [address], [place] [address] [place]  [address] Phone: [phone] Fax: [phone]    Your procedure is scheduled on Wednesday, September 21, 2017  Report to [facility] Admitting Entrance "A" at 6:30AM   Call this number if you have problems the morning of surgery:  [phone]   Remember:  Do not eat food or drink liquids after midnight.  Take these medicines the morning of surgery with A SIP OF WATER: Tamoxifen (NOLVADEX), Desvenlafaxine Succinate, and CycloSPORINE (RESTASIS). If needed Loratadine (CLARITIN) for allergies, Sodium chloride (OCEAN) for nasal congestion, and Triamcinolone (NASACORT AQ) for congestion.  7 days before surgery (Mar. 13), stop taking all Aspirins, Vitamins, Fish oils, and Herbal medications. Also stop all NSAIDS i.e. Advil, Ibuprofen, Motrin, Aleve, Anaprox, Naproxen, BC and Goody Powders.   Do not wear jewelry, make-up or nail polish.  Do not wear lotions, powders, perfumes, or deodorant.  Do not shave 48 hours prior to surgery.    Do not bring valuables to the hospital.  [facility] is not responsible for any belongings or valuables.  Contacts, dentures or bridgework may not be worn into surgery.  Leave your suitcase in the car.  After surgery it may be brought to your room.  For patients admitted to the hospital, discharge time will be determined by your treatment team.  Patients discharged the day of surgery will not be allowed to drive home.   Special instructions:   [facility]- Preparing For Surgery  Before surgery, you can play an important role. Because skin is not sterile, your skin needs to be as free of germs as possible. You can reduce the number of germs on your skin by washing with CHG (chlorahexidine gluconate) Soap before surgery.  CHG is an antiseptic cleaner which kills germs and bonds with the skin to continue killing germs even after  washing.  Please do not use if you have an allergy to CHG or antibacterial soaps. If your skin becomes reddened/irritated stop using the CHG.  Do not shave (including legs and underarms) for at least 48 hours prior to first CHG shower. It is OK to shave your face.  Please follow these instructions carefully.   1. Shower the NIGHT BEFORE SURGERY and the MORNING OF SURGERY with CHG.   2. If you chose to wash your hair, wash your hair first as usual with your normal shampoo.  3. After you shampoo, rinse your hair and body thoroughly to remove the shampoo.  4. Use CHG as you would any other liquid soap. You can apply CHG directly to the skin and wash gently with a scrungie or a clean washcloth.   5. Apply the CHG Soap to your body ONLY FROM THE NECK DOWN.  Do not use on open wounds or open sores. Avoid contact with your eyes, ears, mouth and genitals (private parts). Wash Face and genitals (private parts)  with your normal soap.  6. Wash thoroughly, paying special attention to the area where your surgery will be performed.  7. Thoroughly rinse your body with warm water from the neck down.  8. DO NOT shower/wash with your normal soap after using and rinsing off the CHG Soap.  9. Pat yourself dry with a CLEAN TOWEL.  10. Wear CLEAN PAJAMAS to bed the night before surgery, wear comfortable clothes the morning of surgery  11. Place CLEAN SHEETS on  your bed the night of your first shower and DO NOT SLEEP WITH PETS.  Day of Surgery: Do not apply any deodorants/lotions. Please wear clean clothes to the hospital/surgery center.    Please read over the following fact sheets that you were given. Pain Booklet, Coughing and Deep Breathing, MRSA Information and Surgical Site Infection Prevention

## 2017-09-13 ENCOUNTER — Encounter (HOSPITAL_COMMUNITY)
Admission: RE | Admit: 2017-09-13 | Discharge: 2017-09-13 | Disposition: A | Discharge status: Home / Self Care | Payer: Medicare Other | Source: Ambulatory Visit | Attending: Orthopedic Surgery | Admitting: Orthopedic Surgery

## 2017-09-13 ENCOUNTER — Other Ambulatory Visit: Payer: Self-pay

## 2017-09-13 ENCOUNTER — Encounter (HOSPITAL_COMMUNITY): Payer: Self-pay

## 2017-09-13 DIAGNOSIS — M5136 Other intervertebral disc degeneration, lumbar region: Secondary | ICD-10-CM | POA: Diagnosis not present

## 2017-09-13 DIAGNOSIS — Z01818 Encounter for other preprocedural examination: Secondary | ICD-10-CM | POA: Insufficient documentation

## 2017-09-13 DIAGNOSIS — M4316 Spondylolisthesis, lumbar region: Secondary | ICD-10-CM | POA: Diagnosis not present

## 2017-09-13 LAB — BASIC METABOLIC PANEL
ANION GAP: 9 (ref 5–15)
BUN: 10 mg/dL (ref 6–20)
CO2: 23 mmol/L (ref 22–32)
CREATININE: 0.67 mg/dL (ref 0.44–1.00)
Calcium: 8.8 mg/dL — ABNORMAL LOW (ref 8.9–10.3)
Chloride: 100 mmol/L — ABNORMAL LOW (ref 101–111)
GFR calc non Af Amer: >60 mL/min (ref >60)
Glucose, Bld: 111 mg/dL — ABNORMAL HIGH (ref 65–99)
Potassium: 4.3 mmol/L (ref 3.5–5.1)
Sodium: 132 mmol/L — ABNORMAL LOW (ref 135–145)

## 2017-09-13 LAB — CBC
HCT: 38.3 % (ref 36.0–46.0)
HEMOGLOBIN: 12.8 g/dL (ref 12.0–15.0)
MCH: 34 pg (ref 26.0–34.0)
MCHC: 33.4 g/dL (ref 30.0–36.0)
MCV: 101.6 fL — ABNORMAL HIGH (ref 78.0–100.0)
Platelets: 308 10*3/uL (ref 150–400)
RBC: 3.77 MIL/uL — AB (ref 3.87–5.11)
RDW: 12.8 % (ref 11.5–15.5)
WBC: 5.5 10*3/uL (ref 4.0–10.5)

## 2017-09-13 LAB — SURGICAL PCR SCREEN
MRSA, PCR: NEGATIVE
Staphylococcus aureus: POSITIVE — AB

## 2017-09-13 LAB — TYPE AND SCREEN
ABO/RH(D): A POS
Antibody Screen: NEGATIVE

## 2017-09-13 LAB — ABO/RH: ABO/RH(D): A POS

## 2017-09-13 NOTE — Progress Notes (Signed)
PCP -Marton Redwood  Cardiologist - denies  Chest x-ray - 11/29/16 EKG - 11/29/16 Stress Test - deneis ECHO - denies Cardiac Cath - denies   Anesthesia review: yes per Dr. Rolena Infante  Patient denies shortness of breath, fever, cough and chest pain at PAT appointment   Patient verbalized understanding of instructions that were given to them at the PAT appointment. Patient was also instructed that they will need to review over the PAT instructions again at home before surgery.

## 2017-09-13 NOTE — Progress Notes (Signed)
[place]  [phone] [phone]     ID: [name] DOB: 02-01-1946  MR#: [id number]  MGQ#:[id number]  Patient Care Team: Marton Redwood, MD as PCP - General (Internal Medicine) Jovita Kussmaul, MD as Consulting Physician (General Surgery) Naz Denunzio, Virgie Dad, MD as Consulting Physician (Oncology) Kyung Rudd, MD as Consulting Physician (Radiation Oncology) Melina Schools, MD as Consulting Physician (Orthopedic Surgery) Marcial Pacas, MD as Consulting Physician (Neurology) Armbruster, Carlota Raspberry, MD as Consulting Physician (Gastroenterology) Gardenia Phlegm, NP as Nurse Practitioner (Hematology and Oncology) OTHER MD:  CHIEF COMPLAINT: Ductal carcinoma in situ  CURRENT TREATMENT: tamoxifen   BREAST CANCER HISTORY: From the original intake note:  Maria Jackson had suspicious right breast calcifications noted on her November 2016 mammography and this was followed up 11/12/2015 with a right diagnostic mammogram which again showed a 0.6 cm area of grouped vascular calcifications which appeared benign. On 06/03/2016 she underwent bilateral diagnostic mammography with special attention to the right breast, with tomosynthesis. The breast density was category B. In the right breast lower inner quadrant there was a group of linear calcifications now measuring 2 cm. Biopsy was performed, and showed (06/09/2016, [id number] [id number]) ductal carcinoma in situ, high-grade, estrogen receptor 95% positive, with strong staining intensity, just her and receptor negative.  The patient's subsequent history is as detailed below   INTERVAL HISTORY: Maria Jackson returns today for follow up and treatment of her estrogen receptor positive breast cancer. She continues on tamoxifen, with good tolerance. She has occasional hot flashes. She denies issues with increased vaginal discharge.   Since her last visit, she underwent diagnostic bilateral mammography with CAD and tomography on  06/08/2017 at [facility] showing: breast density category B. There was no evidence of malignancy.    CT on 11/26/2016 (because of a tick bite with adverse reactions) showed: No acute abnormality seen to explain the patient's symptoms. Dilatation of the common bile duct to 1.9 cm, with diffuse intrahepatic biliary duct dilatation. This may remain within normal limits status post cholecystectomy, though would correlate clinically to exclude postcholecystectomy syndrome. Mild diverticulosis at the sigmoid colon, without evidence of diverticulitis. Grade 2 anterolisthesis of L4 on L5, reflecting underlying facet disease.  She also had an MRI of the lumbar spine on at [facility] 07/20/2017 showing mild spinal canal stenosis, moderate neural foraminal narrowing at L4-5, degenerative disc disease, but overall no significant change compared to prior and no evidence of metastatic disease.  However, even though the spinal stenosis and other back problems are long-standing, she is having more weakness in the left leg and left foot and she is scheduled for fusion on 09/21/2017   REVIEW OF SYSTEMS: Maria Jackson reports that she is doing well. She sees Dr. Marlou Starks in the summer for check ups. She is having L4-L5 spinal fusion surgery on 09/21/2017 under Dr. Lanice Schwab. She has had back pain for about 9 years. More recently, she had numbness and lost strength in her left leg. This is causing her to have more difficulty walking and steering. She plans on exercising once her back issues have resolved. She denies unusual headaches, visual changes, nausea, vomiting, or dizziness. There has been no unusual cough, phlegm production, or pleurisy. This been no change in bowel or bladder habits. She denies unexplained fatigue or unexplained weight loss, bleeding, rash, or fever. A detailed review of systems was otherwise stable.    PAST MEDICAL HISTORY: Past Medical History:  Diagnosis Date  . Allergy   .  Arthritis  knees  . Breast cancer ([place]) 06/09/2016   right DCIS  . Cancer ([place]) 11/05/11   SQUAMOS CELL CARCINOMA - LEFT CALF  . Cataract   . Chronic lower back pain   . Concussion   . Diplopia    left eye - when she looks down  . Head injury, closed, with concussion   . Insomnia    NO LONGER AN ISSUE  . Malignant neoplasm of lower-inner quadrant of right female breast ([facility]) 06/11/2016  . Osteopenia   . Osteoporosis   . Raynaud disease   . Seasonal allergies   . Spondylolisthesis    lumbar    PAST SURGICAL HISTORY: Past Surgical History:  Procedure Laterality Date  . APPENDECTOMY    . BREAST LUMPECTOMY WITH RADIOACTIVE SEED LOCALIZATION Right 07/14/2016   Procedure: RIGHT BREAST LUMPECTOMY WITH RADIOACTIVE SEED LOCALIZATION;  Surgeon: Autumn Messing III, MD;  Location: [facility];  Service: General;  Laterality: Right;  . CHOLECYSTECTOMY    . COLONOSCOPY    . ENDOSCOPIC VEIN LASER TREATMENT     calf riht and left  . FOOT NEUROMA SURGERY     bilateral  . JOINT REPLACEMENT    . TONSILLECTOMY    . TOTAL KNEE ARTHROPLASTY Left 04/27/2013   Procedure: LEFT TOTAL KNEE ARTHROPLASTY;  Surgeon: Gearlean Alf, MD;  Location: [facility];  Service: Orthopedics;  Laterality: Left;  . TOTAL KNEE ARTHROPLASTY Right 08/27/2013   Procedure: RIGHT TOTAL KNEE ARTHROPLASTY;  Surgeon: Gearlean Alf, MD;  Location: [facility];  Service: Orthopedics;  Laterality: Right;  . TUBAL LIGATION    . VAGINAL HYSTERECTOMY     tah/bso  . WISDOM TOOTH EXTRACTION      FAMILY HISTORY Family History  Problem Relation Age of Onset  . Diabetes Mother        CVA - 07/06/16  . Heart disease Mother   . Heart disease Father        Cancer of eye, Multiple Mylemona  . Colon cancer Neg Hx   . Esophageal cancer Neg Hx   . Stomach cancer Neg Hx   . Rectal cancer Neg Hx   The patient's father died at the age of 56 with multiple myeloma. The patient's mother has a history of melanoma. She is 74 years old as of December 2017.  The patient has one brother, no sisters. Her brother has a history of melanoma resection 2. There is no history of breast or ovarian cancer in the family to the patient's knowledge  GYNECOLOGIC HISTORY:  No LMP recorded. Patient has had a hysterectomy. Menarche age 72, first live birth age 7, the patient is Maria Jackson. 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 At home it's she and her husband Maria Jackson who is retired from Engineer, technical sales. Daughter Maria Jackson lives in [place] and is a Energy manager. Son Maria Jackson lives in [place] and he is a Marine scientist. The patient has a total of 4 grandchildren with one on the way    ADVANCED DIRECTIVES: In place including a living will   HEALTH MAINTENANCE: Social History   Tobacco Use  . Smoking status: Never Smoker  . Smokeless tobacco: Never Used  Substance Use Topics  . Alcohol use: Yes    Alcohol/week: 1.2 oz  Types: 2 Glasses of wine per week    Comment: red wine...  1/3 GLASS at dinner  . Drug use: No     Colonoscopy:05/20/2011/Repeat being considered later this year   PAP:  Bone density: 06/03/2016 at [facility], T score -2.5   Allergies  Allergen Reactions  . Bee Venom Itching, Swelling and Other (See Comments)    SWELLING REACTION UNSPECIFIED  REDNESS ITCHING  . Ibuprofen Other (See Comments)    Mouth ulcers  . Cephalexin     UNSPECIFIED REACTION   . Penicillins     Has patient had a PCN reaction causing immediate rash, facial/tongue/throat swelling, SOB or lightheadedness with hypotension:unsure Has patient had a PCN reaction causing severe rash involving mucus membranes or skin necrosis:unsure Has patient had a PCN reaction that required hospitalization:No Has patient had a PCN  reaction occurring within the last 10 years:No Reaction unknown If all of the above answers are "NO", then may proceed with Cephalosporin use.   . Sulfonamide Derivatives Rash    Current Outpatient Medications  Medication Sig Dispense Refill  . Cholecalciferol (VITAMIN D3) 2000 units TABS Take 2,000 Units by mouth daily at 12 noon.    . conjugated estrogens (PREMARIN) vaginal cream Place 1 Applicatorful vaginally every Monday. Taking .5 of the applicator    . cycloSPORINE (RESTASIS) 0.05 % ophthalmic emulsion Place 1 drop into both eyes 2 (two) times daily.    Marland Kitchen denosumab (PROLIA) 60 MG/ML SOLN injection Inject 60 mg into the skin every 6 (six) months. Administer in upper arm, thigh, or abdomen    . Desvenlafaxine Succinate ER 25 MG TB24 Take 25 mg by mouth daily. (0930)  5  . loratadine (CLARITIN) 10 MG tablet Take 10 mg by mouth as needed for allergies.    . Lutein 6 MG CAPS Take 6 mg by mouth 2 (two) times daily.     . Methylcellulose, Laxative, (CITRUCEL PO) Take 1 tablet by mouth every evening.     . nitrofurantoin, macrocrystal-monohydrate, (MACROBID) 100 MG capsule Take 100 mg by mouth daily as needed (for UTI prevention).    . pantoprazole (PROTONIX) 40 MG tablet Take 40 mg by mouth daily before supper.   2  . sodium chloride (OCEAN) 0.65 % SOLN nasal spray Place 1 spray into both nostrils 4 (four) times daily as needed for congestion.    . tamoxifen (NOLVADEX) 20 MG tablet Take 20 mg by mouth daily.    Marland Kitchen triamcinolone (NASACORT AQ) 55 MCG/ACT AERO nasal inhaler Place 2 sprays into the nose daily as needed (for allergies.).     No current facility-administered medications for this visit.     OBJECTIVE: Middle-aged white woman in no acute distress  Vitals:   09/15/17 1305  BP: (!) 144/69  Pulse: 67  Resp: 18  Temp: 98.4 F (36.9 C)  SpO2: 98%     Body mass index is 27.42 kg/m.    ECOG FS:1 - Symptomatic but completely ambulatory  Sclerae unicteric, EOMs  intact Oropharynx clear and moist No cervical or supraclavicular adenopathy Lungs no rales or rhonchi Heart regular rate and rhythm Abd soft, nontender, positive bowel sounds MSK no focal spinal tenderness, no upper extremity lymphedema Neuro: nonfocal, well oriented, appropriate affect Breasts: The right breast is status post lumpectomy and radiation.  The cosmetic result is excellent.  There is no evidence of local recurrence.  The left breast is benign.  Both axillae are benign.   LAB RESULTS:  CMP     Component  Value Date/Time   NA 132 (L) 09/13/2017 1324   NA 136 12/23/2016 1221   K 4.3 09/13/2017 1324   K 4.4 12/23/2016 1221   CL 100 (L) 09/13/2017 1324   CO2 23 09/13/2017 1324   CO2 27 12/23/2016 1221   GLUCOSE 111 (H) 09/13/2017 1324   GLUCOSE 86 12/23/2016 1221   BUN 10 09/13/2017 1324   BUN 15.5 12/23/2016 1221   CREATININE 0.67 09/13/2017 1324   CREATININE 0.7 12/23/2016 1221   CALCIUM 8.8 (L) 09/13/2017 1324   CALCIUM 8.9 12/23/2016 1221   PROT 6.3 (L) 12/23/2016 1221   ALBUMIN 3.4 (L) 12/23/2016 1221   AST 22 12/23/2016 1221   ALT 17 12/23/2016 1221   ALKPHOS 36 (L) 12/23/2016 1221   BILITOT 0.54 12/23/2016 1221   GFRNONAA >60 09/13/2017 1324   GFRAA >60 09/13/2017 1324    INo results found for: SPEP, UPEP  Lab Results  Component Value Date   WBC 5.9 09/15/2017   NEUTROABS 3.8 09/15/2017   HGB 12.6 09/15/2017   HCT 37.5 09/15/2017   MCV 104.7 (H) 09/15/2017   PLT 299 09/15/2017      Chemistry      Component Value Date/Time   NA 132 (L) 09/13/2017 1324   NA 136 12/23/2016 1221   K 4.3 09/13/2017 1324   K 4.4 12/23/2016 1221   CL 100 (L) 09/13/2017 1324   CO2 23 09/13/2017 1324   CO2 27 12/23/2016 1221   BUN 10 09/13/2017 1324   BUN 15.5 12/23/2016 1221   CREATININE 0.67 09/13/2017 1324   CREATININE 0.7 12/23/2016 1221      Component Value Date/Time   CALCIUM 8.8 (L) 09/13/2017 1324   CALCIUM 8.9 12/23/2016 1221   ALKPHOS 36 (L)  12/23/2016 1221   AST 22 12/23/2016 1221   ALT 17 12/23/2016 1221   BILITOT 0.54 12/23/2016 1221       No results found for: LABCA2  No components found for: LABCA125  No results for input(s): INR in the last 168 hours.  Urinalysis    Component Value Date/Time   COLORURINE YELLOW 11/29/2016 1700   APPEARANCEUR CLEAR 11/29/2016 1700   LABSPEC 1.006 11/29/2016 1700   PHURINE 6.0 11/29/2016 1700   GLUCOSEU NEGATIVE 11/29/2016 1700   HGBUR SMALL (A) 11/29/2016 1700   BILIRUBINUR NEGATIVE 11/29/2016 1700   KETONESUR 5 (A) 11/29/2016 1700   PROTEINUR NEGATIVE 11/29/2016 1700   UROBILINOGEN 0.2 08/22/2013 1357   NITRITE NEGATIVE 11/29/2016 [place] 11/29/2016 1700     STUDIES: Since her last visit, she underwent diagnostic bilateral mammography with CAD and tomography on 06/08/2017 at [facility] showing: breast density category B. There was no evidence of malignancy.  CT on 11/26/2016 (because of a tick bite with adverse reactions) showed: No acute abnormality seen to explain the patient's symptoms. Dilatation of the common bile duct to 1.9 cm, with diffuse intrahepatic biliary duct dilatation. This may remain within normal limits status post cholecystectomy, though would correlate clinically to exclude postcholecystectomy syndrome. Mild diverticulosis at the sigmoid colon, without evidence of diverticulitis. Grade 2 anterolisthesis of L4 on L5, reflecting underlying facet disease.  She also had an MRI of the lumbar spine on 08/23/2017 at [facility].   ELIGIBLE FOR AVAILABLE RESEARCH PROTOCOL: No  ASSESSMENT: 72 y.o. [place], Maria Jackson woman status post right breast lower inner quadrant biopsy 06/03/2016 for ductal carcinoma in situ, high-grade, estrogen receptor positive, progesterone receptor negative  (1) right lumpectomy 07/14/2016  showed ductal carcinoma in situ, high-grade, measuring 0.8 cm, with negative margins  (2) adjuvant  radiation 08/09/16-09/03/16    1) Right breast/ 42.5 Gy in 17 fractions      2) Right breast boost/ 7.5 Gy in 3 fractions  (3) started tamoxifen mid March 2018  (a) bone density 06/03/2016 at [facility] shows osteoporosis with a T score of -2.5  (b) prolia started at Dr. Raul Del office 08/13/2016  PLAN: Camyra is now a little over a year out from definitive surgery for her noninvasive breast cancer.  There is no evidence of disease recurrence.  This is favorable.  She is tolerating tamoxifen well, and the plan is to continue that for a total of 5 years.  While on tamoxifen it is safe for her to use a vaginal estrogen cream as she is using, currently weekly.  When she goes off tamoxifen though we will not have data regarding safety of that preparation in women like her.  Since she is having surgery next week I have asked her to stop both the tamoxifen and the vaginal estrogens, both of which can increase the risk of clotting.  She may resume them 10 days postop assuming no complications  She will see me again in 1 year.  She knows to call for any other issues that may develop before then.      Braelen Sproule, Virgie Dad, MD  09/15/17 1:23 PM Medical Oncology and Hematology [facility] [address] [place], [place] [address] Tel. [phone]    Fax. [phone]  This document serves as a record of services personally performed by Lurline Del, MD. It was created on his behalf by Sheron Nightingale, a trained medical scribe. The creation of this record is based on the scribe's personal observations and the provider's statements to them.   I have reviewed the above documentation for accuracy and completeness, and I agree with the above.

## 2017-09-13 NOTE — H&P (Addendum)
Patient ID: Maria Jackson MRN: [id number] DOB/AGE: Jun 24, 1946 72 y.o.  Admit date: (Not on file)  Admission Diagnoses:  Lumbar spondylolisthesis/Degenerative disease  HPI: The patient presents for her preop H&P surgical intervention coming up on March 20.  Patient has a history of breast cancer continues to be on medication management.  Past Medical History: Past Medical History:  Diagnosis Date  . Allergy   . Arthritis    knees  . 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 Cancer ([place]) 11/05/11   SQUAMOS CELL CARCINOMA - LEFT CALF  . Cataract   . Chronic lower back pain   . Concussion   . Diplopia    left eye - when she looks down  . Head injury, closed, with concussion   . Insomnia    NO LONGER AN ISSUE  . Malignant neoplasm of lower-inner quadrant of right female breast ([place]) 06/11/2016  . Osteopenia   . Osteoporosis   . Raynaud disease   . Seasonal allergies   . Spondylolisthesis    lumbar    Surgical History: Past Surgical History:  Procedure Laterality Date  . APPENDECTOMY    . BREAST LUMPECTOMY WITH RADIOACTIVE SEED LOCALIZATION Right 07/14/2016   Procedure: RIGHT BREAST LUMPECTOMY WITH RADIOACTIVE SEED LOCALIZATION;  Surgeon: Autumn Messing III, MD;  Location: [facility];  Service: General;  Laterality: Right;  . CHOLECYSTECTOMY    . COLONOSCOPY    . ENDOSCOPIC VEIN LASER TREATMENT     calf riht and left  . FOOT NEUROMA SURGERY     bilateral  . JOINT REPLACEMENT    . TONSILLECTOMY    . TOTAL KNEE ARTHROPLASTY Left 04/27/2013   Procedure: LEFT TOTAL KNEE ARTHROPLASTY;  Surgeon: Gearlean Alf, MD;  Location: [facility];  Service: Orthopedics;  Laterality: Left;  . TOTAL KNEE ARTHROPLASTY Right 08/27/2013   Procedure: RIGHT TOTAL KNEE ARTHROPLASTY;  Surgeon: Gearlean Alf, MD;  Location: [facility];  Service: Orthopedics;  Laterality: Right;  . TUBAL LIGATION    . VAGINAL HYSTERECTOMY     tah/bso  . WISDOM TOOTH EXTRACTION      Family History: Family  History  Problem Relation Age of Onset  . Diabetes Mother        CVA - 07/06/16  . Heart disease Mother   . Heart disease Father        Cancer of eye, Multiple Mylemona  . Colon cancer Neg Hx   . Esophageal cancer Neg Hx   . Stomach cancer Neg Hx   . Rectal cancer Neg Hx     Social History: Social History   Socioeconomic History  . Marital status: Married    Spouse name: Not on file  . Number of children: 2  . Years of education: 15  . Highest education level: Not on file  Social Needs  . Financial resource strain: Not on file  . Food insecurity - worry: Not on file  . Food insecurity - inability: Not on file  . Transportation needs - medical: Not on file  . Transportation needs - non-medical: Not on file  Occupational History  . Occupation: Retired  Tobacco Use  . Smoking status: Never Smoker  . Smokeless tobacco: Never Used  Substance and Sexual Activity  . Alcohol use: Yes    Alcohol/week: 1.2 oz    Types: 2 Glasses of wine per week    Comment: red wine...  1/3 GLASS at dinner  . Drug use: No  .  Sexual activity: Yes  Other Topics Concern  . Not on file  Social History Narrative   Lives at home with husband.   Right-handed.   No caffeine use.    Allergies: Bee venom; Ibuprofen; Cephalexin; Penicillins; and Sulfonamide derivatives  Medications: I have reviewed the patient's current medications.  Vital Signs: No data found.  Radiology: No results found.  Labs: No results for input(s): WBC, RBC, HCT, PLT in the last 72 hours. No results for input(s): NA, K, CL, CO2, BUN, CREATININE, GLUCOSE, CALCIUM in the last 72 hours. No results for input(s): LABPT, INR in the last 72 hours.  Review of Systems: ROS  Physical Exam: There is no height or weight on file to calculate BMI.  Physical Exam  Constitutional: She is oriented to person, place, and time. She appears well-developed and well-nourished.  HENT:  Head: Normocephalic.  Eyes: Pupils are equal,  round, and reactive to light.  Neck: Normal range of motion.  Cardiovascular: Regular rhythm.  Respiratory: Effort normal and breath sounds normal.  GI: Soft. Bowel sounds are normal.  Neurological: She is alert and oriented to person, place, and time.  Skin: Skin is warm and dry.  Psychiatric: She has a normal mood and affect. Her behavior is normal. Judgment and thought content normal.   Patient continues to have back buttock and intermittent neuropathic leg pain.  She is alert she is ready 3 no shortness of breath or chest pain abdomen soft and nontender, no loss of bowel and bladder control, no rebound tenderness.  Compartments are soft and nontender in the lower extremity intact peripheral pulses in the lower extremity.  She continues to have extension-related back pain and moderate forward flexion pain.  Her principal pain sources or lumbar spine.  Assessment and Plan: MRI scan: completed on July 20, 2017 field was reviewed with the patient.  I have also reviewed the radiology report.  I agree with the radiologist, she has severe disc space loss at L5-S1 with mild to moderate facet arthrosis and no central or foraminal stenosis is noted at that level.  L4-5 grade 2 spondylolisthesis with moderate to severe degenerative disc disease moderate foraminal stenosis.  This is essentially unchanged from previous MRI scan.  Risks and benefits of surgery were discussed with the patient. These include: Infection, bleeding, death, stroke, paralysis, ongoing or worse pain, need for additional surgery, nonunion, leak of spinal fluid, adjacent segment degeneration requiring additional fusion surgery, need for posterior decompression and/or fusion. Bleeding from major vessels, and blood clots (deep venous thrombosis)requiring additional treatment. Due to the abdominal contents requiring further intervention, loss in bowel and bladder control. Additional risk for female patients: Retrograde ejaculation, and  therefore infertility.  Goal of surgery: Reduced (not eliminated) pain and therefore improved quality of life.  Ronette Deter, PAC for Melina Schools, MD [place] [phone]  Reviewed the surgical procedure with the patient as well as the risks and benefits.  Plan on moving forward with the anterior lumbar interbody fusion at the symptomatic L4-5 level.  While there is degenerative disease at L5-S1 I do believe that the principal source of her back and neuropathic leg pain is the spondylolisthesis at L4-5 with significant foraminal stenosis.  Goal of surgery is to improve and directly the foraminal volume thereby decompressing her to address her neuropathic leg pain and stabilize that level through the fusion.  All this was discussed with the patient and her husband.

## 2017-09-14 NOTE — Progress Notes (Signed)
Anesthesia Chart Review:  Pt is a 72 year old female scheduled for L4-5 anterior lumbar fusion, abdominal exposure on 09/21/2017 with Melina Schools, MD and Curt Jews, MD  - PCP is Marton Redwood, MD  PMH includes:  Raynaud disease, breast cancer (s/p R breast lumpectomy 07/14/16). Never smoker. BMI 27. S/p R TKA 08/27/13. S/p L TKA 04/27/13.   Medications include: Protonix  BP 101/78   Pulse 82   Temp 36.4 C (Oral)   Resp 18   Ht 5\' 3"  (1.6 m)   Wt 153 lb 2 oz (69.5 kg)   SpO2 96%   BMI 27.12 kg/m   Preoperative labs reviewed.    CXR 11/26/16: No active cardiopulmonary disease.  EKG 11/29/16: NSR. Rightward axis. Non-specific intra-ventricular conduction delay, new since previous tracing on October 27, 1998  If no changes, I anticipate pt can proceed with surgery as scheduled.   Willeen Cass, FNP-BC [facility]/Anesthesiology Phone: [phone] 09/14/2017 12:47 PM

## 2017-09-15 ENCOUNTER — Inpatient Hospital Stay: Payer: Medicare Other

## 2017-09-15 ENCOUNTER — Telehealth: Payer: Self-pay | Admitting: Oncology

## 2017-09-15 ENCOUNTER — Inpatient Hospital Stay: Payer: Medicare Other | Attending: Oncology | Admitting: Oncology

## 2017-09-15 VITALS — BP 144/69 | HR 67 | Temp 98.4°F | Resp 18 | Ht 63.0 in | Wt 154.8 lb

## 2017-09-15 DIAGNOSIS — C50311 Malignant neoplasm of lower-inner quadrant of right female breast: Secondary | ICD-10-CM

## 2017-09-15 DIAGNOSIS — Z79899 Other long term (current) drug therapy: Secondary | ICD-10-CM | POA: Insufficient documentation

## 2017-09-15 DIAGNOSIS — Z79811 Long term (current) use of aromatase inhibitors: Secondary | ICD-10-CM | POA: Diagnosis not present

## 2017-09-15 DIAGNOSIS — Z923 Personal history of irradiation: Secondary | ICD-10-CM | POA: Insufficient documentation

## 2017-09-15 DIAGNOSIS — Z170 Estrogen receptor positive status [ER+]: Secondary | ICD-10-CM

## 2017-09-15 DIAGNOSIS — M810 Age-related osteoporosis without current pathological fracture: Secondary | ICD-10-CM | POA: Insufficient documentation

## 2017-09-15 DIAGNOSIS — D0511 Intraductal carcinoma in situ of right breast: Secondary | ICD-10-CM | POA: Insufficient documentation

## 2017-09-15 DIAGNOSIS — C44721 Squamous cell carcinoma of skin of unspecified lower limb, including hip: Secondary | ICD-10-CM

## 2017-09-15 DIAGNOSIS — N951 Menopausal and female climacteric states: Secondary | ICD-10-CM | POA: Diagnosis not present

## 2017-09-15 LAB — COMPREHENSIVE METABOLIC PANEL
ALBUMIN: 3.7 g/dL (ref 3.5–5.0)
ALT: 17 U/L (ref 0–55)
ANION GAP: 8 (ref 3–11)
AST: 24 U/L (ref 5–34)
Alkaline Phosphatase: 41 U/L (ref 40–150)
BUN: 15 mg/dL (ref 7–26)
CHLORIDE: 100 mmol/L (ref 98–109)
CO2: 27 mmol/L (ref 22–29)
Calcium: 9.2 mg/dL (ref 8.4–10.4)
Creatinine, Ser: 0.73 mg/dL (ref 0.60–1.10)
GFR calc Af Amer: >60 mL/min (ref >60)
GLUCOSE: 83 mg/dL (ref 70–140)
POTASSIUM: 4 mmol/L (ref 3.5–5.1)
Sodium: 135 mmol/L — ABNORMAL LOW (ref 136–145)
TOTAL PROTEIN: 6.7 g/dL (ref 6.4–8.3)
Total Bilirubin: 0.7 mg/dL (ref 0.2–1.2)

## 2017-09-15 LAB — CBC WITH DIFFERENTIAL/PLATELET
BASOS ABS: 0 10*3/uL (ref 0.0–0.1)
BASOS PCT: 1 %
EOS PCT: 1 %
Eosinophils Absolute: 0 10*3/uL (ref 0.0–0.5)
HCT: 37.5 % (ref 34.8–46.6)
Hemoglobin: 12.6 g/dL (ref 11.6–15.9)
Lymphocytes Relative: 17 %
Lymphs Abs: 1 10*3/uL (ref 0.9–3.3)
MCH: 35.2 pg — ABNORMAL HIGH (ref 25.1–34.0)
MCHC: 33.6 g/dL (ref 31.5–36.0)
MCV: 104.7 fL — ABNORMAL HIGH (ref 79.5–101.0)
MONO ABS: 1 10*3/uL — AB (ref 0.1–0.9)
MONOS PCT: 18 %
Neutro Abs: 3.8 10*3/uL (ref 1.5–6.5)
Neutrophils Relative %: 63 %
PLATELETS: 299 10*3/uL (ref 145–400)
RBC: 3.58 MIL/uL — ABNORMAL LOW (ref 3.70–5.45)
RDW: 12.9 % (ref 11.2–14.5)
WBC: 5.9 10*3/uL (ref 3.9–10.3)

## 2017-09-15 MED ORDER — TAMOXIFEN CITRATE 20 MG PO TABS: 20.0000 mg | ORAL_TABLET | Freq: Every day | ORAL | 4 refills | Status: DC

## 2017-09-15 NOTE — Telephone Encounter (Signed)
Gave avs and calendar ° °

## 2017-09-19 DIAGNOSIS — J310 Chronic rhinitis: Secondary | ICD-10-CM | POA: Diagnosis not present

## 2017-09-19 DIAGNOSIS — H9201 Otalgia, right ear: Secondary | ICD-10-CM | POA: Diagnosis not present

## 2017-09-21 ENCOUNTER — Encounter (HOSPITAL_COMMUNITY): Payer: Self-pay | Admitting: *Deleted

## 2017-09-21 ENCOUNTER — Encounter (HOSPITAL_COMMUNITY)
Admission: RE | Disposition: A | Discharge status: Home / Self Care | Payer: Self-pay | Source: Ambulatory Visit | Attending: Orthopedic Surgery

## 2017-09-21 ENCOUNTER — Inpatient Hospital Stay (HOSPITAL_COMMUNITY): Payer: Medicare Other | Admitting: Certified Registered"

## 2017-09-21 ENCOUNTER — Inpatient Hospital Stay (HOSPITAL_COMMUNITY): Payer: Medicare Other

## 2017-09-21 ENCOUNTER — Inpatient Hospital Stay (HOSPITAL_COMMUNITY): Payer: Medicare Other | Admitting: Emergency Medicine

## 2017-09-21 ENCOUNTER — Inpatient Hospital Stay (HOSPITAL_COMMUNITY)
Admission: RE | Admit: 2017-09-21 | Discharge: 2017-09-23 | DRG: 460 | Disposition: A | Discharge status: Home / Self Care | Payer: Medicare Other | Source: Ambulatory Visit | Attending: Orthopedic Surgery | Admitting: Orthopedic Surgery

## 2017-09-21 DIAGNOSIS — M48061 Spinal stenosis, lumbar region without neurogenic claudication: Secondary | ICD-10-CM | POA: Diagnosis not present

## 2017-09-21 DIAGNOSIS — Z880 Allergy status to penicillin: Secondary | ICD-10-CM | POA: Diagnosis not present

## 2017-09-21 DIAGNOSIS — Z981 Arthrodesis status: Secondary | ICD-10-CM | POA: Diagnosis not present

## 2017-09-21 DIAGNOSIS — Z8249 Family history of ischemic heart disease and other diseases of the circulatory system: Secondary | ICD-10-CM | POA: Diagnosis not present

## 2017-09-21 DIAGNOSIS — Z419 Encounter for procedure for purposes other than remedying health state, unspecified: Secondary | ICD-10-CM

## 2017-09-21 DIAGNOSIS — K5900 Constipation, unspecified: Secondary | ICD-10-CM | POA: Diagnosis not present

## 2017-09-21 DIAGNOSIS — Z96653 Presence of artificial knee joint, bilateral: Secondary | ICD-10-CM | POA: Diagnosis not present

## 2017-09-21 DIAGNOSIS — Z833 Family history of diabetes mellitus: Secondary | ICD-10-CM | POA: Diagnosis not present

## 2017-09-21 DIAGNOSIS — Z823 Family history of stroke: Secondary | ICD-10-CM

## 2017-09-21 DIAGNOSIS — N185 Chronic kidney disease, stage 5: Secondary | ICD-10-CM | POA: Diagnosis not present

## 2017-09-21 DIAGNOSIS — I739 Peripheral vascular disease, unspecified: Secondary | ICD-10-CM | POA: Diagnosis not present

## 2017-09-21 DIAGNOSIS — Z91030 Bee allergy status: Secondary | ICD-10-CM | POA: Diagnosis not present

## 2017-09-21 DIAGNOSIS — Z808 Family history of malignant neoplasm of other organs or systems: Secondary | ICD-10-CM

## 2017-09-21 DIAGNOSIS — M4726 Other spondylosis with radiculopathy, lumbar region: Secondary | ICD-10-CM | POA: Diagnosis not present

## 2017-09-21 DIAGNOSIS — Z882 Allergy status to sulfonamides status: Secondary | ICD-10-CM | POA: Diagnosis not present

## 2017-09-21 DIAGNOSIS — Z853 Personal history of malignant neoplasm of breast: Secondary | ICD-10-CM | POA: Diagnosis not present

## 2017-09-21 DIAGNOSIS — Z886 Allergy status to analgesic agent status: Secondary | ICD-10-CM

## 2017-09-21 DIAGNOSIS — M5441 Lumbago with sciatica, right side: Secondary | ICD-10-CM | POA: Diagnosis not present

## 2017-09-21 DIAGNOSIS — M4316 Spondylolisthesis, lumbar region: Principal | ICD-10-CM | POA: Diagnosis present

## 2017-09-21 DIAGNOSIS — C50311 Malignant neoplasm of lower-inner quadrant of right female breast: Secondary | ICD-10-CM | POA: Diagnosis not present

## 2017-09-21 DIAGNOSIS — M5136 Other intervertebral disc degeneration, lumbar region: Secondary | ICD-10-CM | POA: Diagnosis present

## 2017-09-21 DIAGNOSIS — R109 Unspecified abdominal pain: Secondary | ICD-10-CM | POA: Diagnosis not present

## 2017-09-21 DIAGNOSIS — M5416 Radiculopathy, lumbar region: Secondary | ICD-10-CM | POA: Diagnosis not present

## 2017-09-21 DIAGNOSIS — Z9889 Other specified postprocedural states: Secondary | ICD-10-CM | POA: Diagnosis not present

## 2017-09-21 HISTORY — PX: ABDOMINAL EXPOSURE: SHX5708

## 2017-09-21 HISTORY — PX: ANTERIOR LUMBAR FUSION: SHX1170

## 2017-09-21 SURGERY — ANTERIOR LUMBAR FUSION 1 LEVEL
Anesthesia: General

## 2017-09-21 MED ORDER — DEXAMETHASONE SODIUM PHOSPHATE 10 MG/ML IJ SOLN
INTRAMUSCULAR | Status: DC | PRN
  Administered 2017-09-21: 10 mg via INTRAVENOUS

## 2017-09-21 MED ORDER — PROPOFOL 10 MG/ML IV BOLUS
INTRAVENOUS | Status: DC | PRN
  Administered 2017-09-21: 160 mg via INTRAVENOUS

## 2017-09-21 MED ORDER — LIDOCAINE 2% (20 MG/ML) 5 ML SYRINGE
INTRAMUSCULAR | Status: DC | PRN
  Administered 2017-09-21: 60 mg via INTRAVENOUS

## 2017-09-21 MED ORDER — ACETAMINOPHEN 650 MG RE SUPP: 650.0000 mg | RECTAL | Status: DC | PRN

## 2017-09-21 MED ORDER — MIDAZOLAM HCL 2 MG/2ML IJ SOLN
INTRAMUSCULAR | Status: AC
  Filled 2017-09-21: qty 2

## 2017-09-21 MED ORDER — DEXAMETHASONE SODIUM PHOSPHATE 10 MG/ML IJ SOLN
INTRAMUSCULAR | Status: AC
  Filled 2017-09-21: qty 1

## 2017-09-21 MED ORDER — ONDANSETRON HCL 4 MG/2ML IJ SOLN: 4.0000 mg | Freq: Four times a day (QID) | INTRAMUSCULAR | Status: DC | PRN

## 2017-09-21 MED ORDER — CHLORHEXIDINE GLUCONATE 4 % EX LIQD: 60.0000 mL | Freq: Once | CUTANEOUS | Status: DC

## 2017-09-21 MED ORDER — PHENOL 1.4 % MT LIQD: 1.0000 | OROMUCOSAL | Status: DC | PRN

## 2017-09-21 MED ORDER — VENLAFAXINE HCL ER 37.5 MG PO CP24
37.5000 mg | ORAL_CAPSULE | Freq: Every day | ORAL | Status: DC
  Administered 2017-09-22 – 2017-09-23 (×2): 37.5 mg via ORAL
  Filled 2017-09-21 (×2): qty 1

## 2017-09-21 MED ORDER — VANCOMYCIN HCL IN DEXTROSE 750-5 MG/150ML-% IV SOLN
750.0000 mg | Freq: Once | INTRAVENOUS | Status: AC
  Administered 2017-09-21: 750 mg via INTRAVENOUS
  Filled 2017-09-21: qty 150

## 2017-09-21 MED ORDER — LIDOCAINE HCL (CARDIAC) 20 MG/ML IV SOLN
INTRAVENOUS | Status: AC
Start: 2017-09-21 — End: 2017-09-21
  Filled 2017-09-21: qty 5

## 2017-09-21 MED ORDER — MORPHINE SULFATE (PF) 4 MG/ML IV SOLN: 1.0000 mg | INTRAVENOUS | Status: DC | PRN

## 2017-09-21 MED ORDER — THROMBIN (RECOMBINANT) 5000 UNITS EX SOLR
CUTANEOUS | Status: DC | PRN
  Administered 2017-09-21: 20000 [IU] via TOPICAL

## 2017-09-21 MED ORDER — ONDANSETRON 4 MG PO TBDP: 4.0000 mg | ORAL_TABLET | Freq: Three times a day (TID) | ORAL | 0 refills | Status: DC | PRN

## 2017-09-21 MED ORDER — BUPIVACAINE-EPINEPHRINE (PF) 0.25% -1:200000 IJ SOLN
INTRAMUSCULAR | Status: DC | PRN
  Administered 2017-09-21: 10 mL

## 2017-09-21 MED ORDER — MORPHINE SULFATE (PF) 2 MG/ML IV SOLN: 1.0000 mg | INTRAVENOUS | Status: DC | PRN

## 2017-09-21 MED ORDER — THROMBIN (RECOMBINANT) 20000 UNITS EX SOLR
CUTANEOUS | Status: AC
Start: 2017-09-21 — End: 2017-09-21
  Filled 2017-09-21: qty 20000

## 2017-09-21 MED ORDER — HYDROMORPHONE HCL 1 MG/ML IJ SOLN
INTRAMUSCULAR | Status: AC
  Administered 2017-09-21: 0.5 mg via INTRAVENOUS
  Filled 2017-09-21: qty 1

## 2017-09-21 MED ORDER — ONDANSETRON HCL 4 MG/2ML IJ SOLN
INTRAMUSCULAR | Status: DC | PRN
  Administered 2017-09-21: 4 mg via INTRAVENOUS

## 2017-09-21 MED ORDER — DOCUSATE SODIUM 100 MG PO CAPS
100.0000 mg | ORAL_CAPSULE | Freq: Two times a day (BID) | ORAL | Status: DC
  Administered 2017-09-21 – 2017-09-23 (×5): 100 mg via ORAL
  Filled 2017-09-21 (×6): qty 1

## 2017-09-21 MED ORDER — POLYETHYLENE GLYCOL 3350 17 G PO PACK: 17.0000 g | PACK | Freq: Every day | ORAL | Status: DC | PRN

## 2017-09-21 MED ORDER — SODIUM CHLORIDE 0.9 % IV SOLN: 250.0000 mL | INTRAVENOUS | Status: DC

## 2017-09-21 MED ORDER — HEMOSTATIC AGENTS (NO CHARGE) OPTIME
TOPICAL | Status: DC | PRN
  Administered 2017-09-21: 1 via TOPICAL

## 2017-09-21 MED ORDER — ONDANSETRON HCL 4 MG/2ML IJ SOLN
INTRAMUSCULAR | Status: AC
  Filled 2017-09-21: qty 2

## 2017-09-21 MED ORDER — METHOCARBAMOL 500 MG PO TABS: 500.0000 mg | ORAL_TABLET | Freq: Three times a day (TID) | ORAL | 0 refills | Status: DC

## 2017-09-21 MED ORDER — SUCCINYLCHOLINE CHLORIDE 200 MG/10ML IV SOSY
PREFILLED_SYRINGE | INTRAVENOUS | Status: AC
  Filled 2017-09-21: qty 10

## 2017-09-21 MED ORDER — DEXTROSE 5 % IV SOLN: 500.0000 mg | Freq: Four times a day (QID) | INTRAVENOUS | Status: DC | PRN

## 2017-09-21 MED ORDER — LORATADINE 10 MG PO TABS: 10.0000 mg | ORAL_TABLET | ORAL | Status: DC | PRN

## 2017-09-21 MED ORDER — BUPIVACAINE-EPINEPHRINE (PF) 0.25% -1:200000 IJ SOLN
INTRAMUSCULAR | Status: AC
Start: 2017-09-21 — End: 2017-09-21
  Filled 2017-09-21: qty 30

## 2017-09-21 MED ORDER — EPHEDRINE SULFATE-NACL 50-0.9 MG/10ML-% IV SOSY
PREFILLED_SYRINGE | INTRAVENOUS | Status: DC | PRN
  Administered 2017-09-21 (×2): 5 mg via INTRAVENOUS

## 2017-09-21 MED ORDER — SODIUM CHLORIDE 0.9% FLUSH: 3.0000 mL | Freq: Two times a day (BID) | INTRAVENOUS | Status: DC

## 2017-09-21 MED ORDER — FENTANYL CITRATE (PF) 250 MCG/5ML IJ SOLN
INTRAMUSCULAR | Status: AC
  Filled 2017-09-21: qty 5

## 2017-09-21 MED ORDER — SUCCINYLCHOLINE CHLORIDE 200 MG/10ML IV SOSY
PREFILLED_SYRINGE | INTRAVENOUS | Status: DC | PRN
  Administered 2017-09-21: 100 mg via INTRAVENOUS

## 2017-09-21 MED ORDER — FENTANYL CITRATE (PF) 250 MCG/5ML IJ SOLN
INTRAMUSCULAR | Status: AC
Start: 2017-09-21 — End: 2017-09-21
  Filled 2017-09-21: qty 5

## 2017-09-21 MED ORDER — PROPOFOL 500 MG/50ML IV EMUL
INTRAVENOUS | Status: DC | PRN
  Administered 2017-09-21: 30 ug/kg/min via INTRAVENOUS

## 2017-09-21 MED ORDER — LACTATED RINGERS IV SOLN
INTRAVENOUS | Status: DC
  Administered 2017-09-21 (×3): via INTRAVENOUS

## 2017-09-21 MED ORDER — SODIUM CHLORIDE 0.9% FLUSH: 3.0000 mL | INTRAVENOUS | Status: DC | PRN

## 2017-09-21 MED ORDER — SUGAMMADEX SODIUM 200 MG/2ML IV SOLN
INTRAVENOUS | Status: DC | PRN
  Administered 2017-09-21: 140.4 mg via INTRAVENOUS

## 2017-09-21 MED ORDER — ACETAMINOPHEN 10 MG/ML IV SOLN
INTRAVENOUS | Status: DC | PRN
  Administered 2017-09-21: 1000 mg via INTRAVENOUS

## 2017-09-21 MED ORDER — ROCURONIUM BROMIDE 10 MG/ML (PF) SYRINGE
PREFILLED_SYRINGE | INTRAVENOUS | Status: DC | PRN
  Administered 2017-09-21: 30 mg via INTRAVENOUS
  Administered 2017-09-21: 50 mg via INTRAVENOUS
  Administered 2017-09-21: 20 mg via INTRAVENOUS

## 2017-09-21 MED ORDER — MENTHOL 3 MG MT LOZG: 1.0000 | LOZENGE | OROMUCOSAL | Status: DC | PRN

## 2017-09-21 MED ORDER — PROPOFOL 10 MG/ML IV BOLUS
INTRAVENOUS | Status: AC
  Filled 2017-09-21: qty 20

## 2017-09-21 MED ORDER — HYDROMORPHONE HCL 1 MG/ML IJ SOLN
0.2500 mg | INTRAMUSCULAR | Status: DC | PRN
  Administered 2017-09-21 (×2): 0.5 mg via INTRAVENOUS

## 2017-09-21 MED ORDER — EPHEDRINE 5 MG/ML INJ
INTRAVENOUS | Status: AC
  Filled 2017-09-21: qty 10

## 2017-09-21 MED ORDER — OXYCODONE HCL 5 MG PO TABS
10.0000 mg | ORAL_TABLET | ORAL | Status: DC | PRN
  Administered 2017-09-22 – 2017-09-23 (×5): 10 mg via ORAL
  Filled 2017-09-21 (×6): qty 2

## 2017-09-21 MED ORDER — OXYCODONE HCL 5 MG PO TABS: 5.0000 mg | ORAL_TABLET | ORAL | 0 refills | Status: DC | PRN

## 2017-09-21 MED ORDER — ACETAMINOPHEN 325 MG PO TABS
650.0000 mg | ORAL_TABLET | ORAL | Status: DC | PRN
  Administered 2017-09-22 – 2017-09-23 (×2): 650 mg via ORAL
  Filled 2017-09-21 (×2): qty 2

## 2017-09-21 MED ORDER — OXYCODONE HCL 5 MG PO TABS
5.0000 mg | ORAL_TABLET | ORAL | Status: DC | PRN
  Administered 2017-09-21 – 2017-09-22 (×6): 5 mg via ORAL
  Filled 2017-09-21 (×6): qty 1

## 2017-09-21 MED ORDER — ROCURONIUM BROMIDE 10 MG/ML (PF) SYRINGE
PREFILLED_SYRINGE | INTRAVENOUS | Status: AC
  Filled 2017-09-21: qty 5

## 2017-09-21 MED ORDER — 0.9 % SODIUM CHLORIDE (POUR BTL) OPTIME
TOPICAL | Status: DC | PRN
  Administered 2017-09-21: 1000 mL

## 2017-09-21 MED ORDER — LACTATED RINGERS IV SOLN: INTRAVENOUS | Status: DC

## 2017-09-21 MED ORDER — MIDAZOLAM HCL 5 MG/5ML IJ SOLN
INTRAMUSCULAR | Status: DC | PRN
  Administered 2017-09-21 (×2): 1 mg via INTRAVENOUS

## 2017-09-21 MED ORDER — VANCOMYCIN HCL IN DEXTROSE 1-5 GM/200ML-% IV SOLN
1000.0000 mg | INTRAVENOUS | Status: AC
  Administered 2017-09-21: 1000 mg via INTRAVENOUS
  Filled 2017-09-21: qty 200

## 2017-09-21 MED ORDER — ONDANSETRON HCL 4 MG PO TABS
4.0000 mg | ORAL_TABLET | Freq: Four times a day (QID) | ORAL | Status: DC | PRN
  Administered 2017-09-22: 4 mg via ORAL
  Filled 2017-09-21 (×2): qty 1

## 2017-09-21 MED ORDER — ACETAMINOPHEN 10 MG/ML IV SOLN
INTRAVENOUS | Status: AC
  Filled 2017-09-21: qty 100

## 2017-09-21 MED ORDER — FENTANYL CITRATE (PF) 250 MCG/5ML IJ SOLN
INTRAMUSCULAR | Status: DC | PRN
  Administered 2017-09-21: 50 ug via INTRAVENOUS
  Administered 2017-09-21: 100 ug via INTRAVENOUS
  Administered 2017-09-21: 50 ug via INTRAVENOUS
  Administered 2017-09-21: 100 ug via INTRAVENOUS
  Administered 2017-09-21: 50 ug via INTRAVENOUS

## 2017-09-21 MED ORDER — PANTOPRAZOLE SODIUM 40 MG PO TBEC
40.0000 mg | DELAYED_RELEASE_TABLET | Freq: Every day | ORAL | Status: DC
  Administered 2017-09-21 – 2017-09-22 (×2): 40 mg via ORAL
  Filled 2017-09-21 (×3): qty 1

## 2017-09-21 MED ORDER — METHOCARBAMOL 500 MG PO TABS
500.0000 mg | ORAL_TABLET | Freq: Four times a day (QID) | ORAL | Status: DC | PRN
  Administered 2017-09-21 – 2017-09-23 (×4): 500 mg via ORAL
  Filled 2017-09-21 (×4): qty 1

## 2017-09-21 MED ORDER — MAGNESIUM CITRATE PO SOLN
1.0000 | Freq: Once | ORAL | Status: AC | PRN
  Administered 2017-09-21: 1 via ORAL
  Filled 2017-09-21: qty 296

## 2017-09-21 SURGICAL SUPPLY — 96 items
ADH SKN CLS APL DERMABOND .7 (GAUZE/BANDAGES/DRESSINGS) ×1
APPLIER CLIP 11 MED OPEN (CLIP) ×2
APR CLP MED 11 20 MLT OPN (CLIP) ×1
BLADE CLIPPER SURG (BLADE) ×1 IMPLANT
BLADE SURG 10 STRL SS (BLADE) ×2 IMPLANT
BONE VIVIGEN FORMABLE 5.4CC (Bone Implant) ×2 IMPLANT
CABLE BIPOLOR RESECTION CORD (MISCELLANEOUS) ×2 IMPLANT
CLIP APPLIE 11 MED OPEN (CLIP) ×2 IMPLANT
CLIP LIGATING EXTRA MED SLVR (CLIP) ×2 IMPLANT
CLIP LIGATING EXTRA SM BLUE (MISCELLANEOUS) ×2 IMPLANT
COVER SURGICAL LIGHT HANDLE (MISCELLANEOUS) ×2 IMPLANT
DERMABOND ADVANCED (GAUZE/BANDAGES/DRESSINGS) ×1
DERMABOND ADVANCED .7 DNX12 (GAUZE/BANDAGES/DRESSINGS) ×1 IMPLANT
DRAPE C-ARM 42X72 X-RAY (DRAPES) ×4 IMPLANT
DRAPE C-ARMOR (DRAPES) ×2 IMPLANT
DRAPE POUCH INSTRU U-SHP 10X18 (DRAPES) ×2 IMPLANT
DRAPE SURG 17X23 STRL (DRAPES) ×2 IMPLANT
DRAPE U-SHAPE 47X51 STRL (DRAPES) ×2 IMPLANT
DRSG OPSITE POSTOP 4X8 (GAUZE/BANDAGES/DRESSINGS) ×2 IMPLANT
DURAPREP 26ML APPLICATOR (WOUND CARE) ×2 IMPLANT
ELECT BLADE 4.0 EZ CLEAN MEGAD (MISCELLANEOUS) ×4
ELECT CAUTERY BLADE 6.4 (BLADE) ×2 IMPLANT
ELECT PENCIL ROCKER SW 15FT (MISCELLANEOUS) ×2 IMPLANT
ELECT REM PT RETURN 9FT ADLT (ELECTROSURGICAL) ×2
ELECTRODE BLDE 4.0 EZ CLN MEGD (MISCELLANEOUS) ×2 IMPLANT
ELECTRODE REM PT RTRN 9FT ADLT (ELECTROSURGICAL) ×1 IMPLANT
FLOSEAL 10ML (HEMOSTASIS) ×2 IMPLANT
GAUZE SPONGE 4X4 16PLY XRAY LF (GAUZE/BANDAGES/DRESSINGS) IMPLANT
GLOVE BIO SURGEON STRL SZ 6.5 (GLOVE) ×2 IMPLANT
GLOVE BIO SURGEON STRL SZ7.5 (GLOVE) IMPLANT
GLOVE BIOGEL PI IND STRL 6.5 (GLOVE) ×1 IMPLANT
GLOVE BIOGEL PI IND STRL 8 (GLOVE) IMPLANT
GLOVE BIOGEL PI IND STRL 8.5 (GLOVE) ×2 IMPLANT
GLOVE BIOGEL PI INDICATOR 6.5 (GLOVE) ×1
GLOVE BIOGEL PI INDICATOR 8 (GLOVE) ×1
GLOVE BIOGEL PI INDICATOR 8.5 (GLOVE) ×2
GLOVE ECLIPSE 8.0 STRL XLNG CF (GLOVE) ×2 IMPLANT
GLOVE SS BIOGEL STRL SZ 7.5 (GLOVE) ×1 IMPLANT
GLOVE SS BIOGEL STRL SZ 8.5 (GLOVE) ×2 IMPLANT
GLOVE SUPERSENSE BIOGEL SZ 7.5 (GLOVE) ×1
GLOVE SUPERSENSE BIOGEL SZ 8.5 (GLOVE) ×2
GOWN STRL REUS W/ TWL LRG LVL3 (GOWN DISPOSABLE) ×3 IMPLANT
GOWN STRL REUS W/TWL 2XL LVL3 (GOWN DISPOSABLE) ×6 IMPLANT
GOWN STRL REUS W/TWL LRG LVL3 (GOWN DISPOSABLE) ×6
GRAFT BNE MATRIX VG FRMBL MD 5 (Bone Implant) IMPLANT
HEMOSTAT SNOW SURGICEL 2X4 (HEMOSTASIS) IMPLANT
INSERT FOGARTY 61MM (MISCELLANEOUS) IMPLANT
INSERT FOGARTY SM (MISCELLANEOUS) IMPLANT
INTERPLATE 39X14X8 (Plate) ×2 IMPLANT
KIT BASIN OR (CUSTOM PROCEDURE TRAY) ×2 IMPLANT
KIT ROOM TURNOVER OR (KITS) ×2 IMPLANT
LOOP VESSEL MAXI BLUE (MISCELLANEOUS) IMPLANT
LOOP VESSEL MINI RED (MISCELLANEOUS) IMPLANT
NDL SPNL 18GX3.5 QUINCKE PK (NEEDLE) ×1 IMPLANT
NEEDLE SPNL 18GX3.5 QUINCKE PK (NEEDLE) ×2 IMPLANT
NS IRRIG 1000ML POUR BTL (IV SOLUTION) ×2 IMPLANT
PACK LAMINECTOMY ORTHO (CUSTOM PROCEDURE TRAY) ×2 IMPLANT
PACK UNIVERSAL I (CUSTOM PROCEDURE TRAY) ×2 IMPLANT
PAD ARMBOARD 7.5X6 YLW CONV (MISCELLANEOUS) ×8 IMPLANT
PEEK SPACER INTERPLAT 35X14X8 (Peek) ×1 IMPLANT
PLATE SPINAL INTERPLAT 39X14X8 (Plate) IMPLANT
PUTTY BONE DBX 2.5 MIS (Bone Implant) ×1 IMPLANT
SCREW BONE RESCUE (Screw) ×2 IMPLANT
SCREW BONE STANDARD (Screw) ×4 IMPLANT
SCREW BONE STD (Screw) IMPLANT
SCREW SPINAL STD (Orthopedic Implant) ×1 IMPLANT
SPONGE INTESTINAL PEANUT (DISPOSABLE) ×6 IMPLANT
SPONGE LAP 18X18 X RAY DECT (DISPOSABLE) IMPLANT
SPONGE LAP 4X18 X RAY DECT (DISPOSABLE) IMPLANT
SPONGE SURGIFOAM ABS GEL 100 (HEMOSTASIS) IMPLANT
STAPLER VISISTAT 35W (STAPLE) IMPLANT
STRIP CLOSURE SKIN 1/2X4 (GAUZE/BANDAGES/DRESSINGS) ×1 IMPLANT
SUT BONE WAX W31G (SUTURE) ×2 IMPLANT
SUT MON AB 3-0 SH 27 (SUTURE) ×2
SUT MON AB 3-0 SH27 (SUTURE) ×1 IMPLANT
SUT PDS AB 1 CTX 36 (SUTURE) ×2 IMPLANT
SUT PROLENE 4 0 RB 1 (SUTURE)
SUT PROLENE 4-0 RB1 .5 CRCL 36 (SUTURE) IMPLANT
SUT PROLENE 5 0 C 1 24 (SUTURE) IMPLANT
SUT PROLENE 5 0 CC1 (SUTURE) IMPLANT
SUT PROLENE 6 0 C 1 30 (SUTURE) ×2 IMPLANT
SUT PROLENE 6 0 CC (SUTURE) IMPLANT
SUT SILK 0 TIES 10X30 (SUTURE) ×3 IMPLANT
SUT SILK 2 0 TIES 10X30 (SUTURE) ×3 IMPLANT
SUT SILK 2 0SH CR/8 30 (SUTURE) IMPLANT
SUT SILK 3 0 TIES 10X30 (SUTURE) ×3 IMPLANT
SUT SILK 3 0SH CR/8 30 (SUTURE) IMPLANT
SUT VIC AB 1 CT1 27 (SUTURE) ×4
SUT VIC AB 1 CT1 27XBRD ANBCTR (SUTURE) ×2 IMPLANT
SUT VIC AB 2-0 CT1 18 (SUTURE) ×2 IMPLANT
SYR BULB IRRIGATION 50ML (SYRINGE) ×2 IMPLANT
TOWEL GREEN STERILE (TOWEL DISPOSABLE) ×4 IMPLANT
TOWEL GREEN STERILE FF (TOWEL DISPOSABLE) ×2 IMPLANT
TRAP SPECIMEN MUCOUS 40CC (MISCELLANEOUS) ×2 IMPLANT
TRAY FOLEY CATH SILVER 16FR (SET/KITS/TRAYS/PACK) ×2 IMPLANT
WATER STERILE IRR 1000ML POUR (IV SOLUTION) ×2 IMPLANT

## 2017-09-21 NOTE — Transfer of Care (Signed)
Immediate Anesthesia Transfer of Care Note  Patient: Maria Jackson  Procedure(s) Performed: ANTERIOR LUMBAR FUSION L4-5 (N/A ) ABDOMINAL EXPOSURE (N/A )  Patient Location: PACU  Anesthesia Type:General  Level of Consciousness: drowsy and patient cooperative  Airway & Oxygen Therapy: Patient Spontanous Breathing and Patient connected to face mask oxygen  Post-op Assessment: Report given to RN, Post -op Vital signs reviewed and stable and Patient moving all extremities X 4  Post vital signs: Reviewed and stable  Last Vitals:  Vitals:   09/21/17 0639  BP: (!) 170/70  Pulse: 63  Resp: 20  Temp: 36.6 C  SpO2: 100%    Last Pain:  Vitals:   09/21/17 0639  TempSrc: Oral      Patients Stated Pain Goal: 3 (51/83/43 7357)  Complications: No apparent anesthesia complications

## 2017-09-21 NOTE — Brief Op Note (Signed)
09/21/2017  11:38 AM  PATIENT:  Claudia Pollock  72 y.o. 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  PROCEDURE:  Procedure(s) with comments: ANTERIOR LUMBAR FUSION L4-5 (N/A) - 3 hrs ABDOMINAL EXPOSURE (N/A)  SURGEON:  Surgeon(s) and Role: Panel 1:    Melina Schools, MD - Primary Panel 2:    * Early, Arvilla Meres, MD - Primary  PHYSICIAN ASSISTANT:   ASSISTANTS: carmen Mayo   ANESTHESIA:   general  EBL:  150 mL   BLOOD ADMINISTERED:none  DRAINS: none   LOCAL MEDICATIONS USED:  MARCAINE     SPECIMEN:  No Specimen  DISPOSITION OF SPECIMEN:  N/A  COUNTS:  YES  TOURNIQUET:  * No tourniquets in log *  DICTATION: .Dragon Dictation  PLAN OF CARE: Admit to inpatient   PATIENT DISPOSITION:  PACU - hemodynamically stable.

## 2017-09-21 NOTE — Op Note (Signed)
    OPERATIVE REPORT  DATE OF SURGERY: 09/21/2017  PATIENT: Maria Jackson, 72 y.o. female MRN: [id number]  DOB: 01/22/1946  PRE-OPERATIVE DIAGNOSIS: Degenerative disc disease L4-5  POST-OPERATIVE DIAGNOSIS:  Same  PROCEDURE: Anterior exposure for L4-5 disc fusion  SURGEON:  Curt Jews, M.D.  Co-surgeon for the exposure Dr. Rolena Infante  ANESTHESIA: General  EBL: 150 ml  Total I/O In: 2000 [I.V.:2000] Out: 1300 [Urine:1150; Blood:150]  BLOOD ADMINISTERED: None  DRAINS: None  SPECIMEN: None  COUNTS CORRECT:  YES  PLAN OF CARE: PACU  PATIENT DISPOSITION:  PACU - hemodynamically stable  PROCEDURE DETAILS: The patient was taken to the operating placed supine position where the area of the abdomen was prepped and draped in usual sterile fashion.  Lateral lumbar films reviewed the level of the L4-5 disc and this was marked on the surface of the skin.  An incision was made from the midline to the left and carried down through the anterior rectus sheath with electrocautery.  The rectus muscle was mobilized and the end of the intraperitoneal space was entered bluntly in the left lower quadrant.  The intra-abdominal intraperitoneal contents were mobilized to the right and the posterior sheath was opened laterally.  Blunt dissection was continued to the level of the L4-5 disc.  Right ureter was exposed and mobilized to the right.  The patient had a large lumbar vein and this was ligated and divided.  Blunt dissection was continued to isolate the iliolumbar vein and this was ligated and divided.  Blunt dissection continued to mobilize the arterial venous structures to the right.  The Thompson retractor was brought onto the field.  The reverse lip 150 blade was positioned to the right of the L4-5 disc and the 100 blade was positioned to the left of the L4-5 disc.  Malleable retractors were used for superior and inferior exposure.  Spinal needle was placed in the L4-5 disc and C-arm was brought  back onto the field with a lateral projection to determine and confirm that this was L4-5.  The remainder of the procedure will be dictated as a separate note by Dr. Lynnea Ferrier, M.D., [facility] 09/21/2017 11:48 AM

## 2017-09-21 NOTE — Plan of Care (Signed)
  Progressing Safety: Ability to remain free from injury will improve 09/21/2017 2112 - Progressing by Pricilla Holm, Lynetta Tomczak D, RN Activity: Ability to avoid complications of mobility impairment will improve 09/21/2017 2112 - Progressing by Charlena Cross, RN Ability to tolerate increased activity will improve 09/21/2017 2112 - Progressing by Charlena Cross, RN Will remain free from falls 09/21/2017 2112 - Progressing by Margot Chimes D, RN Bowel/Gastric: Gastrointestinal status for postoperative course will improve 09/21/2017 2112 - Progressing by Charlena Cross, RN Education: Ability to verbalize activity precautions or restrictions will improve 09/21/2017 2112 - Progressing by Margot Chimes D, RN Knowledge of the prescribed therapeutic regimen will improve 09/21/2017 2112 - Progressing by Charlena Cross, RN Understanding of discharge needs will improve 09/21/2017 2112 - Progressing by Charlena Cross, RN Physical Regulation: Ability to maintain clinical measurements within normal limits will improve 09/21/2017 2112 - Progressing by Pricilla Holm, Flannery Cavallero D, RN Postoperative complications will be avoided or minimized 09/21/2017 2112 - Progressing by Margot Chimes D, RN Diagnostic test results will improve 09/21/2017 2112 - Progressing by Margot Chimes D, RN Pain Management: Pain level will decrease 09/21/2017 2112 - Progressing by Charlena Cross, RN Skin Integrity: Signs of wound healing will improve 09/21/2017 2112 - Progressing by Margot Chimes D, RN Health Behavior/Discharge Planning: Identification of resources available to assist in meeting health care needs will improve 09/21/2017 2112 - Progressing by Margot Chimes D, RN Bladder/Genitourinary: Urinary functional status for postoperative course will improve 09/21/2017 2112 - Progressing by Charlena Cross, RN

## 2017-09-21 NOTE — Anesthesia Procedure Notes (Signed)
Procedure Name: Intubation Date/Time: 09/21/2017 8:37 AM Performed by: Freddie Breech, CRNA Pre-anesthesia Checklist: Patient identified, Emergency Drugs available, Suction available and Patient being monitored Patient Re-evaluated:Patient Re-evaluated prior to induction Oxygen Delivery Method: Circle System Utilized Preoxygenation: Pre-oxygenation with 100% oxygen Induction Type: IV induction Ventilation: Mask ventilation without difficulty Grade View: Grade I Tube type: Oral Tube size: 7.0 mm Number of attempts: 1 Airway Equipment and Method: Stylet and Oral airway Placement Confirmation: ETT inserted through vocal cords under direct vision,  positive ETCO2 and breath sounds checked- equal and bilateral Secured at: 21 cm Tube secured with: Tape Dental Injury: Teeth and Oropharynx as per pre-operative assessment

## 2017-09-21 NOTE — Progress Notes (Signed)
Pharmacy consulted for vancomycin as surgical prophylaxis. Noted that no drain was placed. Therefore, will give vancomycin 750mg  IV x 1, and pharmacy will sign off.   Thank you for the consult.   Joselyn Glassman, PharmD, BCPS Clinical Pharmacist Pager [phone] Phone [phone]

## 2017-09-21 NOTE — Anesthesia Postprocedure Evaluation (Signed)
Anesthesia Post Note  Patient: Maria Jackson  Procedure(s) Performed: ANTERIOR LUMBAR FUSION L4-5 (N/A ) ABDOMINAL EXPOSURE (N/A )     Patient location during evaluation: PACU Anesthesia Type: General Level of consciousness: awake Pain management: pain level controlled Vital Signs Assessment: post-procedure vital signs reviewed and stable Respiratory status: spontaneous breathing Cardiovascular status: stable Anesthetic complications: no    Last Vitals:  Vitals:   09/21/17 1310 09/21/17 1345  BP: (!) 131/57 (!) 99/46  Pulse: 78 70  Resp: (!) 24 16  Temp: 36.6 C 36.5 C  SpO2: 97% 95%    Last Pain:  Vitals:   09/21/17 1340  TempSrc:   PainSc: 3                  [name]

## 2017-09-21 NOTE — Op Note (Signed)
Operative report  Preoperative diagnosis degenerative spondylolisthesis L4-5 with neuropathic leg pain  Postoperative diagnosis: Same  Procedure: Anterior lumbar interbody fusion (ALIF) L4-5  Approach Surgeon: Dr. Curt Jews  First Assistant: Ronette Deter, PA  Complications: None  Estimated retraction time: 55 minutes  Implant system used: RSP interplate: 14 large 8 degree lordotic cage.  Fixed with 25 mm locking screw superiorly and one 30 mm locking screw inferiorly.  Allograft:vivogen abd DBX mix  Operative report: This is a very pleasant 72 year old woman who presented my office with debilitating back buttock and intermittent neuropathic leg pain.  Imaging studies demonstrated a degenerative spondylolisthesis with foraminal and lateral recess stenosis.  After discussing treatment options we elected to proceed with the anterior interbody fusion to address the instability, discogenic back pain and intermittent neuropathic leg pain.  All appropriate risks benefits and alternatives to surgery were discussed with the patient and her husband and consent was obtained.  Operative procedure: Patient was brought the operating room placed on the operating table.  After successful induction of general anesthesia and endotracheally intubation the patient was properly positioned.  All bony pieces were well-padded abdomen was prepped and draped in a standard fashion.  The incision site was marked out using fluoroscopy and a timeout was taken to confirm patient procedure and all other important data.  Once this was completed Dr. Donnetta Hutching then performed a standard anterior retroperitoneal approach via transverse incision to the lumbar spine.  Please refer to his dictation for specifics.  Once the retractors were positioned and the anterior lumbar spine clearly visualized a needle was placed into the 4 5 disc space and intraoperative x-ray was taken confirming that I was at the appropriate level.  Once this  was confirmed Dr. early scrubbed out and I began the discectomy.  An annulotomy was performed with a 10 blade scalpel and then using a Cobb elevator I release the disc from the endplate.  Then using pituitary rongeurs and curettes and Kerrison rongeurs I resected all of the disc material.  Once I was posterior I could use a fine curved curette to release the annulus from the posterior aspect of L for and that of L5.  Once this was done I used a 2 and 3 mm Kerrison punch to resect the remaining portion of the posterior annulus and to take down the osteophytes from the posterior aspect of the vertebral bodies.  At this point I was very pleased with the discectomy.  Under live fluoroscopy in the lateral plane I confirmed that parallel endplate distraction.  Using a lamina spreader I was able to distract and release under live fluoroscopy confirming parallel endplate distraction.  I then obtained the rasp trials and elected to the trial of the 14 large 8 degree lordotic spacer.  This was malleted to the appropriate depth.  The posterior aspect of the cage came to rest right along the posterior margin of the L4 vertebral body.  Because of the spondylolisthesis I did not want to push the cage further posteriorly and potentially cause neural irritation.  The cage itself was well fitting and a good press-fit and was not loose at all.  As such I elected to use this spacer.  I obtain the peek interbody cage impacted with the allograft bone.  This was malleted to the appropriate depth.  The anterior 0 profile plate was then malleted down once I confirmed satisfactory position of the cage and the plate I then used the awl to pierce the cortex and  then placed the locking screws.  The 25 mm length screws were placed into the L5 vertebral body and a longer 30 mm screw was placed into the L5 vertebral body.  All 3 screws had excellent purchase I then placed the locking cap to prevent back out.  This anti-kick weight was torqued  according manufacturer standards.  I irrigated the wound copiously with normal saline and then made sure hemostasis using bipolar electrocautery and FloSeal.  I then sequentially removed each of the retracting blades ensuring that there was no bleeding.  Once all the retractors were removed I took final AP and lateral fluoroscopy views.  Both x-rays were satisfactory the hardware and and intervertebral space were well-positioned and the screws were properly positioned.  The fascia of the rectus muscle was then reapproximated with a #1 PDS running suture.  Following this I closed in a layered fashion with interrupted #1 Vicryl sutures, 2-0 Vicryl sutures, and a 3-0 Monocryl.  Final AP x-ray was taken and this was unremarkable for any retained surgical instruments other than the orthopedic hardware that I placed.  Steri-Strips and a dry dressing were applied and the patient was ultimately extubated and transferred the PACU without incident.  The end of the case all needle sponge counts were correct.

## 2017-09-21 NOTE — Anesthesia Preprocedure Evaluation (Addendum)
Anesthesia Evaluation  Patient identified by MRN, date of birth, ID band Patient awake    Reviewed: Allergy & Precautions, NPO status , Patient's Chart, lab work & pertinent test results  Airway Mallampati: II  TM Distance: >3 FB     Dental   Pulmonary neg pulmonary ROS,    breath sounds clear to auscultation       Cardiovascular + Peripheral Vascular Disease   Rhythm:Regular Rate:Normal     Neuro/Psych  Headaches,    GI/Hepatic Neg liver ROS, PUD,   Endo/Other  negative endocrine ROS  Renal/GU negative Renal ROS     Musculoskeletal  (+) Arthritis ,   Abdominal   Peds  Hematology  (+) anemia ,   Anesthesia Other Findings   Reproductive/Obstetrics                            Anesthesia Physical Anesthesia Plan  ASA: III  Anesthesia Plan: General   Post-op Pain Management:    Induction: Intravenous  PONV Risk Score and Plan: 3 and Ondansetron and Dexamethasone  Airway Management Planned:   Additional Equipment:   Intra-op Plan:   Post-operative Plan: Possible Post-op intubation/ventilation  Informed Consent: I have reviewed the patients History and Physical, chart, labs and discussed the procedure including the risks, benefits and alternatives for the proposed anesthesia with the patient or authorized representative who has indicated his/her understanding and acceptance.   Dental advisory given  Plan Discussed with: Anesthesiologist, CRNA and Surgeon  Anesthesia Plan Comments:         Anesthesia Quick Evaluation

## 2017-09-21 NOTE — Discharge Instructions (Signed)
Spinal Fusion, Care After °These instructions give you information about caring for yourself after your procedure. Your doctor may also give you more specific instructions. Call your doctor if you have any problems or questions after your procedure. °Follow these instructions at home: °Medicines °· Take over-the-counter and prescription medicines only as told by your doctor. These include any medicines for pain. °· Do not drive for 24 hours if you received a sedative. °· Do not drive or use heavy machinery while taking prescription pain medicine. °· If you were prescribed an antibiotic medicine, take it as told by your doctor. Do not stop taking the antibiotic even if you start to feel better. °Surgical Cut (Incision) Care °· Follow instructions from your doctor about how to take care of your surgical cut. Make sure you: °? Wash your hands with soap and water before you change your bandage (dressing). If you cannot use soap and water, use hand sanitizer. °? Change your bandage as told by your doctor. °? Leave stitches (sutures), skin glue, or skin tape (adhesive) strips in place. They may need to stay in place for 2 weeks or longer. If tape strips get loose and curl up, you may trim the loose edges. Do not remove tape strips completely unless your doctor says it is okay. °· Keep your surgical cut clean and dry. Do not take baths, swim, or use a hot tub until your doctor says it is okay. °· Check your surgical cut and the area around it every day for: °? Redness. °? Swelling. °? Fluid. °Physical Activity °· Return to your normal activities as told by your doctor. Ask your doctor what activities are safe for you. Rest and protect your back as much as you can. °· Follow instructions from your doctor about how to move. Use good posture to help your spine heal. °· Do not lift anything that is heavier than 8 lb (3.6 kg) or as told by your doctor until he or she says that it is safe. Do not lift anything over your  head. °· Do not twist or bend at the waist until your doctor says it is okay. °· Avoid pushing or pulling motions. °· Do not sit or lie down in the same position for long periods of time. °· Do not start to exercise until your doctor says it is okay. Ask your doctor what kinds of exercise you can do to make your back stronger. °General instructions °· If you were given a brace, use it as told by your doctor. °· Wear compression stockings as told by your doctor. °· Do not use tobacco products. These include cigarettes, chewing tobacco, or e-cigarettes. If you need help quitting, ask your doctor. °· Keep all follow-up visits as told by your doctor. This is important. This includes any visits with your physical therapist, if this applies. °Contact a doctor if: °· Your pain gets worse. °· Your medicine does not help your pain. °· Your legs or feet become painful or swollen. °· Your surgical cut is red, swollen, or painful. °· You have fluid, blood, or pus coming from your surgical cut. °· You feel sick to your stomach (nauseous). °· You throw up (vomit). °· Your have weakness or loss of feeling (numbness) in your legs that is new or getting worse. °· You have a fever. °· You have trouble controlling when you pee (urinate) or poop (have a bowel movement). °Get help right away if: °· Your pain is very bad. °· You have   chest pain. °· You have trouble breathing. °· You start to have a cough. °These symptoms may be an emergency. Do not wait to see if the symptoms will go away. Get medical help right away. Call your local emergency services (911 in the [place] Do not drive yourself to the hospital. °This information is not intended to replace advice given to you by your health care provider. Make sure you discuss any questions you have with your health care provider. °Document Released: 10/15/2010 Document Revised: 02/17/2016 Document Reviewed: 12/04/2014 °Elsevier Interactive Patient Education © 2018 [employer] ° °

## 2017-09-21 NOTE — OR Nursing (Signed)
Dr. Althea Charon (radiologist)  Called and confirmed no retained surgical instruments seen. Only Interbody fusion hardware.

## 2017-09-21 NOTE — Evaluation (Signed)
Physical Therapy Evaluation Patient Details Name: Maria Jackson MRN: [id number] DOB: 18-Jul-1945 Today's Date: 09/21/2017   History of Present Illness  Pt is a 72 y/o female s/p L4-5 ALIF. PMH includes Breast cancer s/p lumpectomy, siplopia, squamos cell carcinoma, osteoporosis, raynaud's and bilat TKA.   Clinical Impression  Patient is s/p above surgery resulting in the deficits listed below (see PT Problem List). Pt tolerated gait well requiring min to min guard A for mobility. Educated about back precautions and generalized walking program. Patient will benefit from skilled PT to increase their independence and safety with mobility (while adhering to their precautions) to allow discharge to the venue listed below.     Follow Up Recommendations No PT follow up;Supervision for mobility/OOB    Equipment Recommendations  None recommended by PT    Recommendations for Other Services       Precautions / Restrictions Precautions Precautions: Back Precaution Booklet Issued: Yes (comment) Precaution Comments: Reviewed back precautions with pt.  Required Braces or Orthoses: Spinal Brace Spinal Brace: Lumbar corset;Applied in standing position Restrictions Weight Bearing Restrictions: No      Mobility  Bed Mobility Overal bed mobility: Needs Assistance Bed Mobility: Sidelying to Sit;Sit to Sidelying   Sidelying to sit: Min assist     Sit to sidelying: Min guard General bed mobility comments: Min A for trunk elevation. Verbal cues for sequencing.   Transfers Overall transfer level: Needs assistance Equipment used: None Transfers: Sit to/from Stand Sit to Stand: Min assist         General transfer comment: Min A for steadying and lift assist.   Ambulation/Gait Ambulation/Gait assistance: Min assist;Min guard Ambulation Distance (Feet): 150 Feet Assistive device: 1 person hand held assist(IV pole ) Gait Pattern/deviations: Step-through pattern;Decreased stride  length Gait velocity: Decreased  Gait velocity interpretation: Below normal speed for age/gender General Gait Details: Slow, guarded gait. Slightly unsteady requiring min to min guard A. Educated about generalized walking program to perform at home.   Stairs            Wheelchair Mobility    Modified Rankin (Stroke Patients Only)       Balance Overall balance assessment: Needs assistance Sitting-balance support: No upper extremity supported;Feet supported Sitting balance-Leahy Scale: Good     Standing balance support: Bilateral upper extremity supported;During functional activity Standing balance-Leahy Scale: Poor Standing balance comment: Reliant on UE support and external support.                              Pertinent Vitals/Pain Pain Assessment: Faces Faces Pain Scale: Hurts little more Pain Location: back  Pain Descriptors / Indicators: Aching;Operative site guarding Pain Intervention(s): Limited activity within patient's tolerance;Monitored during session;Repositioned    Home Living Family/patient expects to be discharged to:: Private residence Living Arrangements: Spouse/significant other Available Help at Discharge: Family;Available 24 hours/day Type of Home: House Home Access: Stairs to enter Entrance Stairs-Rails: Psychiatric nurse of Steps: 4 Home Layout: Two level;Able to live on main level with bedroom/bathroom Home Equipment: Gilford Rile - 2 wheels;Cane - single point;Bedside commode      Prior Function Level of Independence: Independent               Hand Dominance        Extremity/Trunk Assessment   Upper Extremity Assessment Upper Extremity Assessment: Defer to OT evaluation    Lower Extremity Assessment Lower Extremity Assessment: Generalized weakness    Cervical / Trunk  Assessment Cervical / Trunk Assessment: Other exceptions Cervical / Trunk Exceptions: s/p ALIF   Communication   Communication: No  difficulties  Cognition Arousal/Alertness: Awake/alert Behavior During Therapy: WFL for tasks assessed/performed Overall Cognitive Status: Within Functional Limits for tasks assessed                                        General Comments General comments (skin integrity, edema, etc.): Pt's husband present during session.     Exercises     Assessment/Plan    PT Assessment Patient needs continued PT services  PT Problem List Decreased strength;Decreased balance;Decreased mobility;Decreased knowledge of use of DME;Decreased knowledge of precautions;Pain       PT Treatment Interventions DME instruction;Gait training;Stair training;Functional mobility training;Therapeutic activities;Therapeutic exercise;Balance training;Neuromuscular re-education;Patient/family education    PT Goals (Current goals can be found in the Care Plan section)  Acute Rehab PT Goals Patient Stated Goal: to go home  PT Goal Formulation: With patient Time For Goal Achievement: 10/05/17 Potential to Achieve Goals: Good    Frequency Min 5X/week   Barriers to discharge        Co-evaluation               AM-PAC PT "6 Clicks" Daily Activity  Outcome Measure Difficulty turning over in bed (including adjusting bedclothes, sheets and blankets)?: A Little Difficulty moving from lying on back to sitting on the side of the bed? : Unable Difficulty sitting down on and standing up from a chair with arms (e.g., wheelchair, bedside commode, etc,.)?: Unable Help needed moving to and from a bed to chair (including a wheelchair)?: A Little Help needed walking in hospital room?: A Little Help needed climbing 3-5 steps with a railing? : A Lot 6 Click Score: 13    End of Session Equipment Utilized During Treatment: Gait belt;Back brace Activity Tolerance: Patient tolerated treatment well Patient left: in bed;with call bell/phone within reach;with family/visitor present Nurse Communication:  Mobility status PT Visit Diagnosis: Unsteadiness on feet (R26.81);Other abnormalities of gait and mobility (R26.89);Pain Pain - part of body: (back )    Time: 1021-1173 PT Time Calculation (min) (ACUTE ONLY): 20 min   Charges:   PT Evaluation $PT Eval Low Complexity: 1 Low     PT G Codes:        Leighton Ruff, PT, DPT  Acute Rehabilitation Services  Pager: [phone]   Rudean Hitt 09/21/2017, 6:24 PM

## 2017-09-22 ENCOUNTER — Inpatient Hospital Stay (HOSPITAL_COMMUNITY): Payer: Medicare Other

## 2017-09-22 ENCOUNTER — Encounter (HOSPITAL_COMMUNITY): Payer: Self-pay | Admitting: Orthopedic Surgery

## 2017-09-22 DIAGNOSIS — Z9889 Other specified postprocedural states: Secondary | ICD-10-CM

## 2017-09-22 MED ORDER — ENOXAPARIN SODIUM 40 MG/0.4ML ~~LOC~~ SOLN: 40.0000 mg | SUBCUTANEOUS | 0 refills | Status: DC

## 2017-09-22 MED ORDER — ENOXAPARIN SODIUM 40 MG/0.4ML ~~LOC~~ SOLN
40.0000 mg | Freq: Every day | SUBCUTANEOUS | Status: DC
  Administered 2017-09-22: 40 mg via SUBCUTANEOUS
  Filled 2017-09-22 (×2): qty 0.4

## 2017-09-22 MED FILL — Thrombin (Recombinant) For Soln 20000 Unit: CUTANEOUS | Qty: 1 | Status: AC

## 2017-09-22 NOTE — Progress Notes (Signed)
    Subjective: Procedure(s) (LRB): ANTERIOR LUMBAR FUSION L4-5 (N/A) ABDOMINAL EXPOSURE (N/A) 1 Day Post-Op  Patient reports pain as 3 on 0-10 scale.  Reports decreased leg pain reports incisional back pain   Positive void Negative bowel movement Positive flatus Negative chest pain or shortness of breath  Objective: Vital signs in last 24 hours: Temp:  [97.5 F (36.4 C)-99.4 F (37.4 C)] 99.4 F (37.4 C) (03/21 0400) Pulse Rate:  [66-83] 80 (03/21 0400) Resp:  [14-24] 18 (03/21 0400) BP: (99-143)/(46-70) 121/60 (03/21 0400) SpO2:  [95 %-100 %] 97 % (03/21 0400)  Intake/Output from previous day: 03/20 0701 - 03/21 0700 In: 2300 [I.V.:2150; IV Piggyback:150] Out: 1450 [Urine:1300; Blood:150]  Labs: No results for input(s): WBC, RBC, HCT, PLT in the last 72 hours. No results for input(s): NA, K, CL, CO2, BUN, CREATININE, GLUCOSE, CALCIUM in the last 72 hours. No results for input(s): LABPT, INR in the last 72 hours.  Physical Exam: Neurologically intact ABD soft Intact pulses distally Incision: dressing C/D/I Compartment soft There is no height or weight on file to calculate BMI.   Assessment/Plan: Patient stable  xrays n/a Continue mobilization with physical therapy Continue care  Advance diet Up with therapy  Doppler today Will start lovenox this afternoon Doing well overall - plan on d/c to home Friday/Saturday  Melina Schools, [place] [phone]

## 2017-09-22 NOTE — Progress Notes (Signed)
Preliminary notes by tech--Bilateral lower extremities venous duplex study completed. Negative for deep and superficial veins thrombosis. Left popliteal fossa medially, a complex fluid collection, 2.7x1.4x1.0cm noted.  Result notified RN Caryl Pina by phone.   Hongying Rockie Vawter(RDMS RVT) 09/22/17 2:30 PM

## 2017-09-22 NOTE — Progress Notes (Signed)
Notified Dr. Rolena Infante of Pt's vascular study results. No new orders. Holli Humbles, RN

## 2017-09-22 NOTE — Progress Notes (Signed)
Patient ID: Maria Jackson, female   DOB: Jun 18, 1946, 72 y.o.   MRN: [id number] Comfortable this morning.  Has been up walking.  Had some nausea and vomiting with medications last night but this has resolved.  Soft and nontender.  2+ dorsalis pedis pulse bilaterally  Stable postop day 1 from anterior exposure for L4-5 disc fusion.  We will not follow actively.  Please call if we can assist

## 2017-09-22 NOTE — Evaluation (Signed)
Occupational Therapy Evaluation Patient Details Name: Maria Jackson MRN: [id number] DOB: January 21, 1946 Today's Date: 09/22/2017    History of Present Illness Pt is a 72 y/o female s/p L4-5 ALIF. PMH includes Breast cancer s/p lumpectomy, siplopia, squamos cell carcinoma, osteoporosis, raynaud's and bilat TKA.    Clinical Impression   This 72 y/o F presents with the above. At baseline pt is independent with ADLs and functional mobility. Pt currently requiring MinA for room level functional mobility within room without AD, modA for LB ADLs. Education provided on back precautions, safety and compensatory techniques for completing ADLs and functional mobility while adhering to precautions after return home. Pt reports she will return home with spouse who is able to assist PRN. Pt will benefit from continued OT services while in acute setting to maximize her overall safety and independence with ADLs and mobility prior to discharge.     Follow Up Recommendations  Follow surgeon's recommendation for DC plan and follow-up therapies;Supervision/Assistance - 24 hour    Equipment Recommendations  None recommended by OT(Pt DME needs are met )           Precautions / Restrictions Precautions Precautions: Back Precaution Booklet Issued: Yes (comment) Precaution Comments: Reviewed back precautions with pt.  Required Braces or Orthoses: Spinal Brace Spinal Brace: Lumbar corset;Applied in standing position Restrictions Weight Bearing Restrictions: No      Mobility Bed Mobility               General bed mobility comments: Pt sitting EOB upon arrival   Transfers Overall transfer level: Needs assistance Equipment used: None Transfers: Sit to/from Stand Sit to Stand: Min assist         General transfer comment: Min A for steadying and lift assist.     Balance Overall balance assessment: Needs assistance Sitting-balance support: No upper extremity supported;Feet supported Sitting  balance-Leahy Scale: Good     Standing balance support: Bilateral upper extremity supported;During functional activity Standing balance-Leahy Scale: Fair Standing balance comment: pt able to maintain static standing with close minguard; seeking UE support and MinA from therapist during dynamic mobility                            ADL either performed or assessed with clinical judgement   ADL Overall ADL's : Needs assistance/impaired Eating/Feeding: Modified independent;Sitting   Grooming: Oral care;Set up;Sitting;Wash/dry hands;Minimal assistance;Standing   Upper Body Bathing: Min guard;Sitting   Lower Body Bathing: Minimal assistance;Sit to/from stand;Cueing for back precautions   Upper Body Dressing : Sitting;Min guard;Set up Upper Body Dressing Details (indicate cue type and reason): set up for spinal brace  Lower Body Dressing: Moderate assistance;Sit to/from stand;Cueing for back precautions   Toilet Transfer: Minimal assistance;Ambulation;Regular Toilet;Grab bars   Toileting- Clothing Manipulation and Hygiene: Minimal assistance;Sit to/from stand;Adhering to back precautions;With adaptive equipment Toileting - Clothing Manipulation Details (indicate cue type and reason): educated on AE for completing peri-care, pt interested in seeing toilet aide     Functional mobility during ADLs: Minimal assistance General ADL Comments: pt requiring minA for room level functional mobility; pt with n/v during session with mobility; educated on AE and compensatory techniques for completing ADLs - pt will benefit from additional practice and demonstration of AE                          Pertinent Vitals/Pain Pain Assessment: Faces Faces Pain Scale: Hurts little more Pain  Location: back  Pain Descriptors / Indicators: Aching;Operative site guarding Pain Intervention(s): Limited activity within patient's tolerance;Monitored during session          Extremity/Trunk  Assessment Upper Extremity Assessment Upper Extremity Assessment: Overall WFL for tasks assessed   Lower Extremity Assessment Lower Extremity Assessment: Defer to PT evaluation   Cervical / Trunk Assessment Cervical / Trunk Assessment: Other exceptions Cervical / Trunk Exceptions: s/p ALIF    Communication Communication Communication: No difficulties   Cognition Arousal/Alertness: Awake/alert Behavior During Therapy: WFL for tasks assessed/performed Overall Cognitive Status: Within Functional Limits for tasks assessed                                                      Home Living Family/patient expects to be discharged to:: Private residence Living Arrangements: Spouse/significant other Available Help at Discharge: Family;Available 24 hours/day Type of Home: House Home Access: Stairs to enter [employer] of Steps: 4 Entrance Stairs-Rails: Right;Left Home Layout: Two level;Able to live on main level with bedroom/bathroom     Bathroom Shower/Tub: Tub/shower unit   Bathroom Toilet: Handicapped height     Home Equipment: Environmental consultant - 2 wheels;Cane - single point;Bedside commode;Shower seat          Prior Functioning/Environment Level of Independence: Independent                 OT Problem List: Decreased activity tolerance;Impaired balance (sitting and/or standing);Decreased knowledge of use of DME or AE;Decreased knowledge of precautions      OT Treatment/Interventions: Self-care/ADL training;DME and/or AE instruction;Therapeutic activities;Balance training;Therapeutic exercise;Energy conservation;Patient/family education    OT Goals(Current goals can be found in the care plan section) Acute Rehab OT Goals Patient Stated Goal: to go home  OT Goal Formulation: With patient Time For Goal Achievement: 10/06/17 Potential to Achieve Goals: Good  OT Frequency: Min 2X/week                             AM-PAC PT "6 Clicks"  Daily Activity     Outcome Measure Help from another person eating meals?: None Help from another person taking care of personal grooming?: A Little Help from another person toileting, which includes using toliet, bedpan, or urinal?: A Little Help from another person bathing (including washing, rinsing, drying)?: A Lot Help from another person to put on and taking off regular upper body clothing?: A Little Help from another person to put on and taking off regular lower body clothing?: A Lot 6 Click Score: 17   End of Session Equipment Utilized During Treatment: Gait belt;Back brace Nurse Communication: Mobility status;Other (comment)(pt with n/v)  Activity Tolerance: Patient tolerated treatment well;Other (comment)(limited by n/v ) Patient left: in chair;with call bell/phone within reach  OT Visit Diagnosis: Other abnormalities of gait and mobility (R26.89)                Time: 0813(-5 minutes while MD in room with pt)-0854 OT Time Calculation (min): 41 min Charges:  OT General Charges $OT Visit: 1 Visit OT Evaluation $OT Eval Low Complexity: 1 Low OT Treatments $Self Care/Home Management : 8-22 mins G-Codes:     Maria Jackson, OT Pager [phone] 09/22/2017   Maria Jackson 09/22/2017, 9:42 AM

## 2017-09-22 NOTE — Progress Notes (Signed)
Physical Therapy Treatment Patient Details Name: Maria Jackson MRN: [id number] DOB: [birth year] Today's Date: 09/22/2017    History of Present Illness Pt is a 72 y/o female s/p L4-5 ALIF. PMH includes Breast cancer s/p lumpectomy, siplopia, squamos cell carcinoma, osteoporosis, raynaud's and bilat TKA.     PT Comments    Pt progressing well towards physical therapy goals. Was able to perform transfers and ambulation with gross min guard assist progressing to supervision for safety with the [facility] for balance support. Pt reports vomiting several times this morning and feeling weak. Overall was able to tolerate functional mobility without complaints of increased pain. Will continue to follow and progress as able per POC.    Follow Up Recommendations  No PT follow up;Supervision for mobility/OOB     Equipment Recommendations  None recommended by PT    Recommendations for Other Services       Precautions / Restrictions Precautions Precautions: Back Precaution Booklet Issued: Yes (comment) Precaution Comments: Reviewed back precautions with pt.  Required Braces or Orthoses: Spinal Brace Spinal Brace: Lumbar corset;Applied in standing position Restrictions Weight Bearing Restrictions: No    Mobility  Bed Mobility               General bed mobility comments: Pt was received sitting up in the recliner upon PT arrival.   Transfers Overall transfer level: Needs assistance Equipment used: None Transfers: Sit to/from Stand Sit to Stand: Min guard         General transfer comment: Close guard for safety as pt powered up to full stand. No overt LOB noted.   Ambulation/Gait Ambulation/Gait assistance: Supervision;Min guard Ambulation Distance (Feet): 300 Feet Assistive device: 1 person hand held assist;Straight cane Gait Pattern/deviations: Step-through pattern;Decreased stride length Gait velocity: Decreased  Gait velocity interpretation: Below normal speed for  age/gender General Gait Details: Slow, guarded gait. Initially hands on guarding required and pt appeared unsteady. When given SPC, pt appeared more confident and was able to progress to close supervision for safety.    Stairs Stairs: Yes   Stair Management: One rail Left;Step to pattern;Forwards Number of Stairs: 4 General stair comments: VC's for sequencing and general safety with SPC during stair negotiation.   Wheelchair Mobility    Modified Rankin (Stroke Patients Only)       Balance Overall balance assessment: Needs assistance Sitting-balance support: No upper extremity supported;Feet supported Sitting balance-Leahy Scale: Good     Standing balance support: Bilateral upper extremity supported;During functional activity Standing balance-Leahy Scale: Fair Standing balance comment: pt able to maintain static standing with close minguard; seeking UE support and MinA from therapist during dynamic mobility                             Cognition Arousal/Alertness: Awake/alert Behavior During Therapy: WFL for tasks assessed/performed Overall Cognitive Status: Within Functional Limits for tasks assessed                                        Exercises      General Comments        Pertinent Vitals/Pain Pain Assessment: Faces Faces Pain Scale: Hurts a little bit Pain Location: back  Pain Descriptors / Indicators: Aching;Operative site guarding Pain Intervention(s): Monitored during session    [place] expects to be discharged to:: Private residence Living Arrangements: Spouse/significant other Available Help  at Discharge: Family;Available 24 hours/day Type of Home: House Home Access: Stairs to enter Entrance Stairs-Rails: Right;Left Home Layout: Two level;Able to live on main level with bedroom/bathroom Home Equipment: Gilford Rile - 2 wheels;Cane - single point;Bedside commode;Shower seat      Prior Function Level of  Independence: Independent          PT Goals (current goals can now be found in the care plan section) Acute Rehab PT Goals Patient Stated Goal: to go home  PT Goal Formulation: With patient Time For Goal Achievement: 10/05/17 Potential to Achieve Goals: Good Progress towards PT goals: Progressing toward goals    Frequency    Min 5X/week      PT Plan Current plan remains appropriate    Co-evaluation              AM-PAC PT "6 Clicks" Daily Activity  Outcome Measure  Difficulty turning over in bed (including adjusting bedclothes, sheets and blankets)?: None Difficulty moving from lying on back to sitting on the side of the bed? : A Little Difficulty sitting down on and standing up from a chair with arms (e.g., wheelchair, bedside commode, etc,.)?: A Little Help needed moving to and from a bed to chair (including a wheelchair)?: A Little Help needed walking in hospital room?: A Little Help needed climbing 3-5 steps with a railing? : A Little 6 Click Score: 19    End of Session Equipment Utilized During Treatment: Gait belt;Back brace Activity Tolerance: Patient tolerated treatment well Patient left: in bed;with call bell/phone within reach;with family/visitor present Nurse Communication: Mobility status PT Visit Diagnosis: Unsteadiness on feet (R26.81);Other abnormalities of gait and mobility (R26.89);Pain Pain - part of body: (back )     Time: 7035-0093 PT Time Calculation (min) (ACUTE ONLY): 22 min  Charges:  $Gait Training: 8-22 mins                    G Codes:       Rolinda Roan, PT, DPT Acute Rehabilitation Services Pager: [phone]    Thelma Comp 09/22/2017, 11:58 AM

## 2017-09-23 ENCOUNTER — Other Ambulatory Visit: Payer: Self-pay

## 2017-09-23 MED ORDER — ENOXAPARIN SODIUM 40 MG/0.4ML ~~LOC~~ SOLN
40.0000 mg | SUBCUTANEOUS | Status: DC
  Filled 2017-09-23: qty 0.4

## 2017-09-23 NOTE — Progress Notes (Signed)
Physical Therapy Treatment and Discharge Patient Details Name: Maria Jackson MRN: [id number] DOB: May 17, 1946 Today's Date: 09/23/2017    History of Present Illness Pt is a 72 y/o female s/p L4-5 ALIF. PMH includes Breast cancer s/p lumpectomy, siplopia, squamos cell carcinoma, osteoporosis, raynaud's and bilat TKA.     PT Comments    Pt progressing well with post-op mobility. Pt demonstrates up to modified independence with [facility] for all mobility. Pt's husband present during session and educated as well for brace application and stair negotiation. Pt was again educated on car transfer and general safety with mobility progression. Will sign off at this time. If needs change, please reconsult.  Follow Up Recommendations  No PT follow up;Supervision for mobility/OOB     Equipment Recommendations  None recommended by PT    Recommendations for Other Services       Precautions / Restrictions Precautions Precautions: Back Precaution Booklet Issued: Yes (comment) Precaution Comments: Reviewed back precautions with pt.  Required Braces or Orthoses: Spinal Brace Spinal Brace: Lumbar corset;Applied in standing position Restrictions Weight Bearing Restrictions: No    Mobility  Bed Mobility               General bed mobility comments: Pt was sitting EOB when PT arrived.  Transfers Overall transfer level: Modified independent Equipment used: Straight cane Transfers: Sit to/from Stand Sit to Stand: Min guard         General transfer comment: Pt able to stand without assistance and without LOB both with and without the [facility] for support.   Ambulation/Gait Ambulation/Gait assistance: Modified independent (Device/Increase time) Ambulation Distance (Feet): 300 Feet Assistive device: Straight cane Gait Pattern/deviations: Step-through pattern;Decreased stride length Gait velocity: Decreased  Gait velocity interpretation: Below normal speed for age/gender General Gait Details:  With cane, pt was able to ambulate well in hall without and overt LOB noted.    Stairs Stairs: Yes   Stair Management: One rail Left;Step to pattern;Forwards;Sideways Number of Stairs: 10 General stair comments: Husband present for stair training. Pt was able to complete without difficulty with husband close for safety.  Wheelchair Mobility    Modified Rankin (Stroke Patients Only)       Balance Overall balance assessment: Needs assistance Sitting-balance support: No upper extremity supported;Feet supported Sitting balance-Leahy Scale: Good     Standing balance support: Bilateral upper extremity supported;During functional activity Standing balance-Leahy Scale: Fair Standing balance comment: pt able to maintain static standing without assistance                            Cognition Arousal/Alertness: Awake/alert Behavior During Therapy: WFL for tasks assessed/performed Overall Cognitive Status: Within Functional Limits for tasks assessed                                        Exercises      General Comments        Pertinent Vitals/Pain Pain Assessment: 0-10 Pain Score: 4  Faces Pain Scale: Hurts a little bit Pain Location: back  Pain Descriptors / Indicators: Aching;Operative site guarding Pain Intervention(s): Monitored during session    Home Living Family/patient expects to be discharged to:: (P) Private residence Living Arrangements: (P) Spouse/significant other                  Prior Function  PT Goals (current goals can now be found in the care plan section) Acute Rehab PT Goals Patient Stated Goal: to go home  PT Goal Formulation: With patient Time For Goal Achievement: 10/05/17 Potential to Achieve Goals: Good Progress towards PT goals: Goals met/education completed, patient discharged from PT    Frequency    Min 5X/week      PT Plan Current plan remains appropriate    Co-evaluation               AM-PAC PT "6 Clicks" Daily Activity  Outcome Measure  Difficulty turning over in bed (including adjusting bedclothes, sheets and blankets)?: None Difficulty moving from lying on back to sitting on the side of the bed? : A Little Difficulty sitting down on and standing up from a chair with arms (e.g., wheelchair, bedside commode, etc,.)?: A Little Help needed moving to and from a bed to chair (including a wheelchair)?: A Little Help needed walking in hospital room?: A Little Help needed climbing 3-5 steps with a railing? : A Little 6 Click Score: 19    End of Session Equipment Utilized During Treatment: Gait belt;Back brace Activity Tolerance: Patient tolerated treatment well Patient left: in bed;with call bell/phone within reach;with family/visitor present Nurse Communication: Mobility status PT Visit Diagnosis: Unsteadiness on feet (R26.81);Other abnormalities of gait and mobility (R26.89);Pain Pain - part of body: (back )     Time: 7062-3762 PT Time Calculation (min) (ACUTE ONLY): 17 min  Charges:  $Gait Training: 8-22 mins                    G Codes:       Rolinda Roan, PT, DPT Acute Rehabilitation Services Pager: [phone]    Thelma Comp 09/23/2017, 10:48 AM

## 2017-09-23 NOTE — Progress Notes (Signed)
Occupational Therapy Treatment Patient Details Name: Maria Jackson MRN: [id number] DOB: 06/30/1946 Today's Date: 09/23/2017    History of present illness Pt is a 72 y/o female s/p L4-5 ALIF. PMH includes Breast cancer s/p lumpectomy, siplopia, squamos cell carcinoma, osteoporosis, raynaud's and bilat TKA.    OT comments  Pt progressing towards acute OT goals. Focus of session was UB/LB dressing. Reviewed precautions and ADL strategies. Pt is for d/c home today.   Follow Up Recommendations  Follow surgeon's recommendation for DC plan and follow-up therapies;Supervision/Assistance - 24 hour    Equipment Recommendations  None recommended by OT    Recommendations for Other Services      Precautions / Restrictions Precautions Precautions: Back Precaution Comments: Reviewed back precautions with pt.  Required Braces or Orthoses: Spinal Brace Spinal Brace: Lumbar corset;Applied in standing position Restrictions Weight Bearing Restrictions: No       Mobility Bed Mobility               General bed mobility comments: OOB on OT arrival. discussed bed mobility technique.  Transfers Overall transfer level: Needs assistance Equipment used: None Transfers: Sit to/from Stand Sit to Stand: Min guard         General transfer comment: Close guard for safety as pt powered up to full stand. No overt LOB noted.     Balance Overall balance assessment: Needs assistance Sitting-balance support: No upper extremity supported;Feet supported Sitting balance-Leahy Scale: Good     Standing balance support: Bilateral upper extremity supported;During functional activity Standing balance-Leahy Scale: Fair Standing balance comment: pt able to maintain static standing with close minguard; seeking UE support and MinA from therapist during dynamic mobility                            ADL either performed or assessed with clinical judgement   ADL Overall ADL's : Needs  assistance/impaired               Lower Body Bathing Details (indicate cue type and reason): discussed long-handled sponge Upper Body Dressing : Set up;Supervision/safety;Sitting Upper Body Dressing Details (indicate cue type and reason): donned UB clothing and brace.  Lower Body Dressing: Minimal assistance;Sit to/from stand;Cueing for back precautions Lower Body Dressing Details (indicate cue type and reason): pt with difficulty raising feet in seated position to access for LB dressing. Reports spouse will assist with getting LB clothing started at home.              Functional mobility during ADLs: Min guard General ADL Comments: min guard to walk in room. seeking single extremity support of furniture during walking. Pt completed UB/LB dressing. Reviewed ADL education.     Vision       Perception     Praxis      Cognition Arousal/Alertness: Awake/alert Behavior During Therapy: WFL for tasks assessed/performed Overall Cognitive Status: Within Functional Limits for tasks assessed                                          Exercises     Shoulder Instructions       General Comments      Pertinent Vitals/ Pain       Pain Assessment: Faces Faces Pain Scale: Hurts a little bit Pain Location: back  Pain Descriptors / Indicators: Aching;Operative site guarding Pain Intervention(s): Monitored  during session  Home Living                                          Prior Functioning/Environment              Frequency  Min 2X/week        Progress Toward Goals  OT Goals(current goals can now be found in the care plan section)  Progress towards OT goals: Progressing toward goals  Acute Rehab OT Goals Patient Stated Goal: to go home  OT Goal Formulation: With patient Time For Goal Achievement: 10/06/17 Potential to Achieve Goals: Good ADL Goals Pt Will Perform Grooming: with supervision;standing Pt Will Perform Lower  Body Bathing: with supervision;sit to/from stand;with adaptive equipment;with caregiver independent in assisting Pt Will Perform Lower Body Dressing: with supervision;sit to/from stand;with adaptive equipment;with caregiver independent in assisting Pt Will Transfer to Toilet: with supervision;ambulating;bedside commode Pt Will Perform Toileting - Clothing Manipulation and hygiene: with supervision;sit to/from stand;with adaptive equipment Pt Will Perform Tub/Shower Transfer: with min guard assist;ambulating;shower seat;Tub transfer  Plan Discharge plan remains appropriate    Co-evaluation                 AM-PAC PT "6 Clicks" Daily Activity     Outcome Measure   Help from another person eating meals?: None Help from another person taking care of personal grooming?: A Little Help from another person toileting, which includes using toliet, bedpan, or urinal?: A Little Help from another person bathing (including washing, rinsing, drying)?: A Lot Help from another person to put on and taking off regular upper body clothing?: A Little Help from another person to put on and taking off regular lower body clothing?: A Lot 6 Click Score: 17    End of Session Equipment Utilized During Treatment: Gait belt;Back brace  OT Visit Diagnosis: Other abnormalities of gait and mobility (R26.89)   Activity Tolerance Patient tolerated treatment well   Patient Left with call bell/phone within reach   Nurse Communication          Time: 1157-2620 OT Time Calculation (min): 25 min  Charges: OT General Charges $OT Visit: 1 Visit OT Treatments $Self Care/Home Management : 23-37 mins     Hortencia Pilar 09/23/2017, 9:41 AM

## 2017-09-23 NOTE — Progress Notes (Signed)
    Subjective: Procedure(s) (LRB): ANTERIOR LUMBAR FUSION L4-5 (N/A) ABDOMINAL EXPOSURE (N/A) 2 Days Post-Op  Patient reports pain as 2 on 0-10 scale.  Reports none leg pain reports incisional back pain   Positive void Negative bowel movement Positive flatus Negative chest pain or shortness of breath  Objective: Vital signs in last 24 hours: Temp:  [97.8 F (36.6 C)-100 F (37.8 C)] 100 F (37.8 C) (03/22 0400) Pulse Rate:  [66-88] 88 (03/22 0400) Resp:  [16-18] 18 (03/22 0400) BP: (109-131)/(49-62) 109/54 (03/22 0400) SpO2:  [96 %-100 %] 96 % (03/22 0400)  Intake/Output from previous day: 03/21 0701 - 03/22 0700 In: 960 [P.O.:960] Out: -   Labs: No results for input(s): WBC, RBC, HCT, PLT in the last 72 hours. No results for input(s): NA, K, CL, CO2, BUN, CREATININE, GLUCOSE, CALCIUM in the last 72 hours. No results for input(s): LABPT, INR in the last 72 hours.  Physical Exam: Neurologically intact ABD soft Intact pulses distally Incision: dressing C/D/I Compartment soft There is no height or weight on file to calculate BMI.   Assessment/Plan: Patient stable  Doppler: negative for DVT.  Noted left popliteal fluid collection - question bakers cyst vs hematoma from fall prior to surgery Will monitor and possible MRI/CT if symptomatic as outpatient Continue mobilization with physical therapy Continue care  Doing well Cleared by PT Plan on d/c to home today  Melina Schools, MD [facility] [phone]

## 2017-09-23 NOTE — Progress Notes (Signed)
Patient is discharged from room 3C09 at this time. Alert and in stable condition. IV site d/c'd and instructions read to patient and spouse with understanding verbalized. Left unit via wheelchair with all belongings at side. 

## 2017-09-27 ENCOUNTER — Encounter (HOSPITAL_COMMUNITY): Payer: Self-pay | Admitting: Orthopedic Surgery

## 2017-09-27 MED FILL — Sodium Chloride IV Soln 0.9%: INTRAVENOUS | Qty: 1000 | Status: AC

## 2017-09-27 MED FILL — Heparin Sodium (Porcine) Inj 1000 Unit/ML: INTRAMUSCULAR | Qty: 30 | Status: AC

## 2017-09-27 NOTE — Discharge Summary (Signed)
Physician Discharge Summary  Patient ID: Maria Jackson MRN: [id number] DOB/AGE: 1946/05/30 72 y.o.  Admit date: 09/21/2017 Discharge date: 09/23/17  Admission Diagnoses:  Lumbar spondylolisthesis  Discharge Diagnoses:  Active Problems:   S/P lumbar fusion   Past Medical History:  Diagnosis Date  . Allergy   . Arthritis    knees  . 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 Cancer ([place]) 11/05/11   SQUAMOS CELL CARCINOMA - LEFT CALF  . Cataract   . Chronic lower back pain   . Concussion   . Diplopia    left eye - when she looks down  . Head injury, closed, with concussion   . Insomnia    NO LONGER AN ISSUE  . Malignant neoplasm of lower-inner quadrant of right female breast ([place]) 06/11/2016  . Osteopenia   . Osteoporosis   . Raynaud disease   . Seasonal allergies   . Spondylolisthesis    lumbar    Surgeries: Procedure(s): ANTERIOR LUMBAR FUSION L4-5 ABDOMINAL EXPOSURE on 09/21/2017   Consultants (if any):   Discharged Condition: Improved  Hospital Course: Maria Jackson is an 72 y.o. female who was admitted 09/21/2017 with a diagnosis of Lumbar spondylolisthesis and went to the operating room on 09/21/2017 and underwent the above named procedures.  Post op day 1 pt reports moderate pain level controlled on oral medication. Post op day 2 doppler is done and lovenox started.  Pts pain level continues to decrease.  PT cleared pt for DC.   She was given perioperative antibiotics:  Anti-infectives (From admission, onward)   Start     Dose/Rate Route Frequency Ordered Stop   09/21/17 1900  vancomycin (VANCOCIN) IVPB 750 mg/150 ml premix     750 mg 150 mL/hr over 60 Minutes Intravenous  Once 09/21/17 1353 09/21/17 1842   09/21/17 0629  vancomycin (VANCOCIN) IVPB 1000 mg/200 mL premix     1,000 mg 200 mL/hr over 60 Minutes Intravenous 60 min pre-op 09/21/17 0629 09/21/17 0807    .  She was given sequential compression devices, early ambulation, and TED for DVT  prophylaxis.  She benefited maximally from the hospital stay and there were no complications.    Recent vital signs:  Vitals:   09/23/17 0400 09/23/17 0756  BP: (!) 109/54 106/67  Pulse: 88 73  Resp: 18 18  Temp: 100 F (37.8 C) 99 F (37.2 C)  SpO2: 96% 95%    Recent laboratory studies:  Lab Results  Component Value Date   HGB 12.6 09/15/2017   HGB 12.8 09/13/2017   HGB 12.3 12/23/2016   Lab Results  Component Value Date   WBC 5.9 09/15/2017   PLT 299 09/15/2017   Lab Results  Component Value Date   INR 1.01 08/22/2013   Lab Results  Component Value Date   NA 135 (L) 09/15/2017   K 4.0 09/15/2017   CL 100 09/15/2017   CO2 27 09/15/2017   BUN 15 09/15/2017   CREATININE 0.73 09/15/2017   GLUCOSE 83 09/15/2017    Discharge Medications:   Allergies as of 09/23/2017      Reactions   Bee Venom Itching, Swelling, Other (See Comments)   SWELLING REACTION UNSPECIFIED  REDNESS ITCHING   Ibuprofen Rash, Other (See Comments)   Mouth ulcers   Cephalexin    UNSPECIFIED REACTION    Penicillins    UNSPECIFIED REACTION  Has patient had a PCN reaction causing immediate rash, facial/tongue/throat swelling, SOB or lightheadedness with hypotension:unsure  Has patient had a PCN reaction causing severe rash involving mucus membranes or skin necrosis:unsure Has patient had a PCN reaction that required hospitalization:No Has patient had a PCN reaction occurring within the last 10 years:No Reaction unknown If all of the above answers are "NO", then may proceed with Cephalosporin use.   Sulfonamide Derivatives Rash      Medication List    STOP taking these medications   tamoxifen 20 MG tablet Commonly known as:  NOLVADEX     TAKE these medications   CITRUCEL PO Take 1 tablet by mouth every evening.   conjugated estrogens vaginal cream Commonly known as:  PREMARIN Place 1 Applicatorful vaginally every Monday. Taking .5 of the applicator   cycloSPORINE 0.05 %  ophthalmic emulsion Commonly known as:  RESTASIS Place 1 drop into both eyes 2 (two) times daily.   denosumab 60 MG/ML Soln injection Commonly known as:  PROLIA Inject 60 mg into the skin every 6 (six) months. Administer in upper arm, thigh, or abdomen   Desvenlafaxine Succinate ER 25 MG Tb24 Take 25 mg by mouth daily. (0930)   enoxaparin 40 MG/0.4ML injection Commonly known as:  LOVENOX Inject 0.4 mLs (40 mg total) into the skin daily.   loratadine 10 MG tablet Commonly known as:  CLARITIN Take 10 mg by mouth as needed for allergies.   Lutein 6 MG Caps Take 6 mg by mouth 2 (two) times daily.   methocarbamol 500 MG tablet Commonly known as:  ROBAXIN Take 1 tablet (500 mg total) by mouth 3 (three) times daily.   NASACORT AQ 55 MCG/ACT Aero nasal inhaler Generic drug:  triamcinolone Place 2 sprays into the nose daily as needed (for allergies.).   nitrofurantoin (macrocrystal-monohydrate) 100 MG capsule Commonly known as:  MACROBID Take 100 mg by mouth daily as needed (for UTI prevention).   ondansetron 4 MG disintegrating tablet Commonly known as:  ZOFRAN ODT Take 1 tablet (4 mg total) by mouth every 8 (eight) hours as needed for nausea or vomiting.   oxyCODONE 5 MG immediate release tablet Commonly known as:  ROXICODONE Take 1 tablet (5 mg total) by mouth every 4 (four) hours as needed.   pantoprazole 40 MG tablet Commonly known as:  PROTONIX Take 40 mg by mouth daily before supper.   sodium chloride 0.65 % Soln nasal spray Commonly known as:  OCEAN Place 1 spray into both nostrils 4 (four) times daily as needed for congestion.   Vitamin D3 2000 units Tabs Take 2,000 Units by mouth daily at 12 noon.       Diagnostic Studies: Dg Lumbar Spine 2-3 Views  Result Date: 09/21/2017 CLINICAL DATA:  Status post a LIF of L4-5. Reported fluoro time is 55 seconds. EXAM: DG C-ARM 61-120 MIN; LUMBAR SPINE - 2-3 VIEW COMPARISON:  Coronal and sagittal reconstructed images  through the lumbar spine from a CT scan of Nov 26, 2016 FINDINGS: The patient has undergone anterior fusion at L4-5 with an intradiscal device placement. The obliquely oriented cortical screws appear appropriately positioned. There is stable grade 1 anterolisthesis of L4 with respect L5. IMPRESSION: ALIF at L4-5 without postprocedure complication. Electronically Signed   By: David  Martinique M.D.   On: 09/21/2017 12:02   Dg C-arm 1-60 Min  Result Date: 09/21/2017 CLINICAL DATA:  Status post a LIF of L4-5. Reported fluoro time is 55 seconds. EXAM: DG C-ARM 61-120 MIN; LUMBAR SPINE - 2-3 VIEW COMPARISON:  Coronal and sagittal reconstructed images through the lumbar spine from a  CT scan of Nov 26, 2016 FINDINGS: The patient has undergone anterior fusion at L4-5 with an intradiscal device placement. The obliquely oriented cortical screws appear appropriately positioned. There is stable grade 1 anterolisthesis of L4 with respect L5. IMPRESSION: ALIF at L4-5 without postprocedure complication. Electronically Signed   By: David  Martinique M.D.   On: 09/21/2017 12:02   Dg Or Local Abdomen  Result Date: 09/21/2017 CLINICAL DATA:  72 year old female undergoing anterior lumbar fusion. Query retained instruments. EXAM: OR LOCAL ABDOMEN COMPARISON:  CT Abdomen and Pelvis 11/26/2016. FINDINGS: Portable AP supine view at 1118 hours. L4-L5 level anterior approach spinal fusion hardware/implant is identified. Preexisting right abdominal suture material. No other No radiopaque foreign body identified. Visible bowel gas pattern within normal limits. IMPRESSION: No retained operative instrument or unexpected radiopaque foreign body. This was telephoned to RN Pia Mau in the [facility] 09/21/2017 at 11:29 . Electronically Signed   By: Genevie Ann M.D.   On: 09/21/2017 11:30    Disposition:  Pt will present to clinic in 2 weeks Post op medication was provided Discharge Instructions    Incentive spirometry RT   Complete by:  As  directed       Follow-up Information    Melina Schools, MD Follow up in 2 week(s).   Specialty:  Orthopedic Surgery Contact information: [address] [place] [address] [phone]            Signed: Valinda Hoar 09/27/2017, 8:07 AM

## 2017-11-04 DIAGNOSIS — Z4789 Encounter for other orthopedic aftercare: Secondary | ICD-10-CM | POA: Diagnosis not present

## 2017-11-04 DIAGNOSIS — Z4889 Encounter for other specified surgical aftercare: Secondary | ICD-10-CM | POA: Diagnosis not present

## 2017-11-07 ENCOUNTER — Ambulatory Visit: Payer: Medicare Other | Admitting: Physical Therapy

## 2017-11-10 ENCOUNTER — Encounter: Payer: Medicare Other | Admitting: *Deleted

## 2017-12-01 ENCOUNTER — Ambulatory Visit: Payer: Medicare Other

## 2017-12-01 ENCOUNTER — Encounter: Payer: Self-pay | Admitting: Podiatry

## 2017-12-01 ENCOUNTER — Ambulatory Visit: Payer: Medicare Other | Admitting: Podiatry

## 2017-12-01 DIAGNOSIS — Q828 Other specified congenital malformations of skin: Secondary | ICD-10-CM | POA: Diagnosis not present

## 2017-12-01 DIAGNOSIS — M79672 Pain in left foot: Secondary | ICD-10-CM

## 2017-12-01 DIAGNOSIS — M2240 Chondromalacia patellae, unspecified knee: Secondary | ICD-10-CM | POA: Insufficient documentation

## 2017-12-01 DIAGNOSIS — M1991 Primary osteoarthritis, unspecified site: Secondary | ICD-10-CM | POA: Insufficient documentation

## 2017-12-01 DIAGNOSIS — L600 Ingrowing nail: Secondary | ICD-10-CM

## 2017-12-02 NOTE — Progress Notes (Signed)
Subjective:   Patient ID: Maria Jackson, female   DOB: 72 y.o.   MRN: [id number]   HPI Patient presents with a painful lesion plantar aspect left foot and also complains of a nail that becomes irritated in the corner and makes it hard to trim it and at times it is becoming increasingly sore   ROS      Objective:  Physical Exam  Neurovascular status intact muscle strength adequate range of motion within normal limits with patient noted to have a keratotic type lesion plantar aspect left second metatarsal that is painful when pressed with fluid buildup that makes it hard to walk and also was noted to have incurvated nail bed that is increasingly sore     Assessment:  Chronic porokeratotic lesion formation left along with secondary ingrown toenail deformity     Plan:  H&P condition reviewed and at this point discussed both problems and recommended debridement of lesion which was done with sharp sterile instrumentation with no iatrogenic bleeding with padding and salicylic acid application and discussed ingrown toenail and possibility for correction future at her convenience

## 2017-12-16 DIAGNOSIS — Z4889 Encounter for other specified surgical aftercare: Secondary | ICD-10-CM | POA: Diagnosis not present

## 2017-12-21 DIAGNOSIS — Z85828 Personal history of other malignant neoplasm of skin: Secondary | ICD-10-CM | POA: Diagnosis not present

## 2017-12-21 DIAGNOSIS — L821 Other seborrheic keratosis: Secondary | ICD-10-CM | POA: Diagnosis not present

## 2017-12-21 DIAGNOSIS — D2262 Melanocytic nevi of left upper limb, including shoulder: Secondary | ICD-10-CM | POA: Diagnosis not present

## 2017-12-21 DIAGNOSIS — D2261 Melanocytic nevi of right upper limb, including shoulder: Secondary | ICD-10-CM | POA: Diagnosis not present

## 2018-01-03 ENCOUNTER — Ambulatory Visit: Payer: Medicare Other | Attending: Orthopedic Surgery | Admitting: Physical Therapy

## 2018-01-03 DIAGNOSIS — M6281 Muscle weakness (generalized): Secondary | ICD-10-CM | POA: Diagnosis not present

## 2018-01-03 DIAGNOSIS — M62838 Other muscle spasm: Secondary | ICD-10-CM | POA: Insufficient documentation

## 2018-01-03 DIAGNOSIS — G8929 Other chronic pain: Secondary | ICD-10-CM | POA: Insufficient documentation

## 2018-01-03 DIAGNOSIS — R279 Unspecified lack of coordination: Secondary | ICD-10-CM | POA: Diagnosis not present

## 2018-01-03 DIAGNOSIS — M545 Low back pain: Secondary | ICD-10-CM | POA: Diagnosis not present

## 2018-01-03 NOTE — Therapy (Signed)
[facility] [place], [place], [address] Phone: [phone]   Fax:  [phone]  Physical Therapy Evaluation  Patient Details  Name: Maria Jackson MRN: [id number] Date of Birth: 15-Feb-1946 Referring Provider: Melina Schools MD.   Encounter Date: 01/03/2018  PT End of Session - 01/03/18 1246    Visit Number  1    Number of Visits  8    Date for PT Re-Evaluation  01/31/18    PT Start Time  1093    PT Stop Time  1210    PT Time Calculation (min)  49 min       Past Medical History:  Diagnosis Date  . Allergy   . Arthritis    knees  . 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 Cancer ([place]) 11/05/11   SQUAMOS CELL CARCINOMA - LEFT CALF  . Cataract   . Chronic lower back pain   . Concussion   . Diplopia    left eye - when she looks down  . Head injury, closed, with concussion   . Insomnia    NO LONGER AN ISSUE  . Malignant neoplasm of lower-inner quadrant of right female breast ([place]) 06/11/2016  . Osteopenia   . Osteoporosis   . Raynaud disease   . Seasonal allergies   . Spondylolisthesis    lumbar    Past Surgical History:  Procedure Laterality Date  . ABDOMINAL EXPOSURE N/A 09/21/2017   Procedure: ABDOMINAL EXPOSURE;  Surgeon: Rosetta Posner, MD;  Location: [facility] OR;  Service: Vascular;  Laterality: N/A;  . ANTERIOR LUMBAR FUSION N/A 09/21/2017   Procedure: ANTERIOR LUMBAR FUSION L4-5;  Surgeon: Melina Schools, MD;  Location: [place] [facility];  Service: Orthopedics;  Laterality: N/A;  3 hrs  . APPENDECTOMY    . BREAST LUMPECTOMY WITH RADIOACTIVE SEED LOCALIZATION Right 07/14/2016   Procedure: RIGHT BREAST LUMPECTOMY WITH RADIOACTIVE SEED LOCALIZATION;  Surgeon: Autumn Messing III, MD;  Location: [place];  Service: General;  Laterality: Right;  . CHOLECYSTECTOMY    . COLONOSCOPY    . ENDOSCOPIC VEIN LASER TREATMENT     calf riht and left  . FOOT NEUROMA SURGERY     bilateral  . JOINT REPLACEMENT    . TONSILLECTOMY    .  TOTAL KNEE ARTHROPLASTY Left 04/27/2013   Procedure: LEFT TOTAL KNEE ARTHROPLASTY;  Surgeon: Gearlean Alf, MD;  Location: [facility];  Service: Orthopedics;  Laterality: Left;  . TOTAL KNEE ARTHROPLASTY Right 08/27/2013   Procedure: RIGHT TOTAL KNEE ARTHROPLASTY;  Surgeon: Gearlean Alf, MD;  Location: [facility];  Service: Orthopedics;  Laterality: Right;  . TUBAL LIGATION    . VAGINAL HYSTERECTOMY     tah/bso  . WISDOM TOOTH EXTRACTION      There were no vitals filed for this visit.   Subjective Assessment - 01/03/18 1249    Subjective  The patient underwent a L4-5 ALIF performed on 09/21/17.  She is doing well thus far.  She admits to recently cleaning her closet becaue she felt so good and this flare-up her pain a bit.  She has been doing very basic core exercises at home.  Her pain-level today is a 3/10.    Rest decreases her pain and activity, sitting on hard surfaces and standing for long periods of time increase her pain.    Pertinent History  Bilateral TKA's; h/o low back pain.    How long can you stand comfortably?  10-15 minutes.  Patient Stated Goals  Get back to normal life.    Currently in Pain?  Yes    Pain Score  3     Pain Location  Back    Pain Orientation  Left    Pain Descriptors / Indicators  Aching;Discomfort;Dull    Pain Type  Chronic pain    Pain Onset  More than a month ago    Pain Frequency  Intermittent    Aggravating Factors   See above.      Pain Relieving Factors  See above.         [facility] PT Assessment - 01/03/18 0001      Assessment   Medical Diagnosis  Post-op ALIF L4-5.    Referring Provider  Melina Schools MD.    Onset Date/Surgical Date  -- 09/21/17 (surgery date).      Precautions   Precautions  -- Lumbar fusion protocol.      Restrictions   Weight Bearing Restrictions  No      Balance Screen   Has the patient fallen in the past 6 months  No    Has the patient had a decrease in activity level because of a fear of falling?   No    Is the  patient reluctant to leave their home because of a fear of falling?   No      Home Film/video editor residence      Prior Function   Level of Independence  Independent      Posture/Postural Control   Posture/Postural Control  No significant limitations      ROM / Strength   AROM / PROM / Strength  AROM;Strength      AROM   Overall AROM Comments  Full bilateral hip flexion assessed in supine.      Strength   Overall Strength Comments  Normal bilateral knee and ankle strength is normal.      Palpation   Palpation comment  N/o palpable pain complaints.      Ambulation/Gait   Gait Comments  WNL.                Objective measurements completed on examination: See above findings.      [facility] Adult PT Treatment/Exercise - 01/03/18 0001      Exercises   Exercises  Knee/Hip      Knee/Hip Exercises: Aerobic   Nustep  Level 3 with cues for correct posture and draw-in x 15 minutes.               PT Short Term Goals - 01/03/18 1335      PT SHORT TERM GOAL #1   Title  STG's=LTG's.        PT Long Term Goals - 01/03/18 1335      PT LONG TERM GOAL #1   Title  Ind with a HEP.    Time  4    Period  Weeks    Status  New      PT LONG TERM GOAL #2   Title  Patient report a 50% greater ease in performing ADL's.    Time  4    Period  Weeks    Status  New             Plan - 01/03/18 1331    Clinical Impression Statement  The patient presents s/p L4-5 ALIF performed on 09/21/17.  She is doing very well thus far.  She exhibits normal bilateral hip  range of motion into flexion and LE strength (bilateral knees and ankles) is normal.  She pain increases have limited her functional mobility a bit.  Patient will benefit from skilled physical therapy intervention in accord with a lumbar fuction protocol.    History and Personal Factors relevant to plan of care:  OP.    Clinical Presentation  Stable    Clinical Presentation due to:   Excellent sugical outcome.    Clinical Decision Making  Low    Rehab Potential  Excellent    PT Frequency  2x / week    PT Duration  4 weeks    PT Treatment/Interventions  ADLs/Self Care Home Management;Cryotherapy;Electrical Stimulation;Ultrasound;Moist Heat;Functional mobility training;Therapeutic activities;Therapeutic exercise;Patient/family education;Manual techniques    PT Next Visit Plan  Core exercise progression with no extension past neutral.  Body mechanics training.  Modalites if needed.    Consulted and Agree with Plan of Care  Patient       Patient will benefit from skilled therapeutic intervention in order to improve the following deficits and impairments:  Decreased activity tolerance, Pain  Visit Diagnosis: Chronic bilateral low back pain, with sciatica presence unspecified - Plan: PT plan of care cert/re-cert     Problem List Patient Active Problem List   Diagnosis Date Noted  . 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 Localized, primary osteoarthritis 12/01/2017  . S/P lumbar fusion 09/21/2017  . Acute otalgia, right 08/29/2017  . Bilateral impacted cerumen 08/29/2017  . Gastroesophageal reflux disease without esophagitis 08/29/2017  . Globus pharyngeus 08/29/2017  . Rhinitis, chronic 08/29/2017  . Lumbar adjacent segment disease with spondylolisthesis 07/12/2017  . Degeneration of lumbar intervertebral disc 07/12/2017  . History of back pain 07/12/2017  . Spinal stenosis of lumbar region 07/12/2017  . Osteoporosis 06/20/2016  . Malignant neoplasm of lower-inner quadrant of right breast of female, estrogen receptor positive ([place]) 06/11/2016  . Cephalalgia 03/04/2016  . Diplopia 09/02/2015  . Neck pain on right side 09/02/2015  . Balance disorder 06/05/2015  . Neck pain 05/15/2015  . Headache 05/15/2015  . Postoperative anemia due to acute blood loss 08/28/2013  . Anemia 08/28/2013  . OA (osteoarthritis) of knee 04/27/2013  . Squamous cell carcinoma of  lower leg 06/07/2012  . Vaginal atrophy 05/25/2012  . CONSTIPATION 08/27/2009  . ULCERATION OF INTESTINE 08/27/2009    Maria Jackson, [place] MPT 01/03/2018, 1:38 PM  [facility] [address] [place], [place], [address] Phone: [phone]   Fax:  [phone]  Name: Maria Jackson MRN: [id number] Date of Birth: [birth year]

## 2018-01-06 ENCOUNTER — Ambulatory Visit: Payer: Medicare Other | Admitting: *Deleted

## 2018-01-06 DIAGNOSIS — M545 Low back pain: Secondary | ICD-10-CM | POA: Diagnosis not present

## 2018-01-06 DIAGNOSIS — M62838 Other muscle spasm: Secondary | ICD-10-CM

## 2018-01-06 DIAGNOSIS — G8929 Other chronic pain: Secondary | ICD-10-CM

## 2018-01-06 DIAGNOSIS — R279 Unspecified lack of coordination: Secondary | ICD-10-CM

## 2018-01-06 DIAGNOSIS — M6281 Muscle weakness (generalized): Secondary | ICD-10-CM

## 2018-01-06 NOTE — Therapy (Signed)
[facility] [place], [place], [address] Phone: [phone]   Fax:  [phone]  Physical Therapy Treatment  Patient Details  Name: Maria Jackson MRN: [id number] Date of Birth: [birth year] Referring Provider: Melina Schools MD.   Encounter Date: 01/06/2018  PT End of Session - 01/06/18 1125    Visit Number  2    Number of Visits  8    Date for PT Re-Evaluation  01/31/18    PT Start Time  1115    PT Stop Time  6195    PT Time Calculation (min)  50 min       Past Medical History:  Diagnosis Date  . Allergy   . Arthritis    knees  . 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 Cancer ([place]) 11/05/11   SQUAMOS CELL CARCINOMA - LEFT CALF  . Cataract   . Chronic lower back pain   . Concussion   . Diplopia    left eye - when she looks down  . Head injury, closed, with concussion   . Insomnia    NO LONGER AN ISSUE  . Malignant neoplasm of lower-inner quadrant of right female breast ([place]) 06/11/2016  . Osteopenia   . Osteoporosis   . Raynaud disease   . Seasonal allergies   . Spondylolisthesis    lumbar    Past Surgical History:  Procedure Laterality Date  . ABDOMINAL EXPOSURE N/A 09/21/2017   Procedure: ABDOMINAL EXPOSURE;  Surgeon: Rosetta Posner, MD;  Location: [facility] OR;  Service: Vascular;  Laterality: N/A;  . ANTERIOR LUMBAR FUSION N/A 09/21/2017   Procedure: ANTERIOR LUMBAR FUSION L4-5;  Surgeon: Melina Schools, MD;  Location: [place];  Service: Orthopedics;  Laterality: N/A;  3 hrs  . APPENDECTOMY    . BREAST LUMPECTOMY WITH RADIOACTIVE SEED LOCALIZATION Right 07/14/2016   Procedure: RIGHT BREAST LUMPECTOMY WITH RADIOACTIVE SEED LOCALIZATION;  Surgeon: Autumn Messing III, MD;  Location: [place];  Service: General;  Laterality: Right;  . CHOLECYSTECTOMY    . COLONOSCOPY    . ENDOSCOPIC VEIN LASER TREATMENT     calf riht and left  . FOOT NEUROMA SURGERY     bilateral  . JOINT REPLACEMENT    . TONSILLECTOMY    . TOTAL  KNEE ARTHROPLASTY Left 04/27/2013   Procedure: LEFT TOTAL KNEE ARTHROPLASTY;  Surgeon: Gearlean Alf, MD;  Location: [facility];  Service: Orthopedics;  Laterality: Left;  . TOTAL KNEE ARTHROPLASTY Right 08/27/2013   Procedure: RIGHT TOTAL KNEE ARTHROPLASTY;  Surgeon: Gearlean Alf, MD;  Location: [facility];  Service: Orthopedics;  Laterality: Right;  . TUBAL LIGATION    . VAGINAL HYSTERECTOMY     tah/bso  . WISDOM TOOTH EXTRACTION      There were no vitals filed for this visit.  Subjective Assessment - 01/06/18 1123    Subjective   Did OK after Eval.    The patient underwent a L4-5 ALIF performed on 09/21/17.  She is doing well thus far.  She admits to recently cleaning her closet becaue she felt so good and this flare-up her pain a bit.  She has been doing very basic core exercises at home.  Her pain-level today is a 3/10.    Rest decreases her pain and activity, sitting on hard surfaces and standing for long periods of time increase her pain.    Pertinent History  Bilateral TKA's; h/o low back pain.    How long can you stand  comfortably?  10-15 minutes.    Patient Stated Goals  Get back to normal life.    Currently in Pain?  Yes    Pain Score  3     Pain Location  Back    Pain Orientation  Left    Pain Descriptors / Indicators  Aching    Pain Type  Chronic pain    Pain Onset  More than a month ago    Pain Frequency  Intermittent                       [facility] Adult PT Treatment/Exercise - 01/06/18 0001      Therapeutic Activites    Therapeutic Activities  ADL's;Lifting    ADL's  Bodymechanics: Kneeling, power position, sit to stand, Log Roll      Exercises   Exercises  Knee/Hip;Lumbar      Lumbar Exercises: Seated   Sit to Stand  10 reps cues for technique      Lumbar Exercises: Supine   Ab Set  15 reps    Bent Knee Raise  20 reps;3 seconds    Bridge  10 reps      Knee/Hip Exercises: Aerobic   Nustep  Level 4 with cues for correct posture and draw-in x 15  minutes.      Knee/Hip Exercises: Supine   Other Supine Knee/Hip Exercises  Piriformis stretching Bil.,  HS stretching with ankle pumps               PT Short Term Goals - 01/03/18 1335      PT SHORT TERM GOAL #1   Title  STG's=LTG's.        PT Long Term Goals - 01/03/18 1335      PT LONG TERM GOAL #1   Title  Ind with a HEP.    Time  4    Period  Weeks    Status  New      PT LONG TERM GOAL #2   Title  Patient report a 50% greater ease in performing ADL's.    Time  4    Period  Weeks    Status  New            Plan - 01/06/18 1126    Clinical Impression Statement  Pt arrived today still doing fairly well with low pain levels 3-4/10. She was able to complete core activation Exs and LE stretches without complaints. Postures and body mechanics were covered and handouts reviewed for HEP.    Clinical Presentation  Stable    Clinical Decision Making  Low    Rehab Potential  Excellent    PT Frequency  2x / week    PT Duration  4 weeks    PT Treatment/Interventions  ADLs/Self Care Home Management;Cryotherapy;Electrical Stimulation;Ultrasound;Moist Heat;Functional mobility training;Therapeutic activities;Therapeutic exercise;Patient/family education;Manual techniques    Consulted and Agree with Plan of Care  Patient       Patient will benefit from skilled therapeutic intervention in order to improve the following deficits and impairments:  Decreased activity tolerance, Pain  Visit Diagnosis: Chronic bilateral low back pain, with sciatica presence unspecified  Muscle weakness (generalized)  Unspecified lack of coordination  Other muscle spasm     Problem List Patient Active Problem List   Diagnosis Date Noted  . 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 Localized, primary osteoarthritis 12/01/2017  . S/P lumbar fusion 09/21/2017  . Acute otalgia, right 08/29/2017  . Bilateral impacted cerumen 08/29/2017  .  Gastroesophageal reflux disease without  esophagitis 08/29/2017  . Globus pharyngeus 08/29/2017  . Rhinitis, chronic 08/29/2017  . Lumbar adjacent segment disease with spondylolisthesis 07/12/2017  . Degeneration of lumbar intervertebral disc 07/12/2017  . History of back pain 07/12/2017  . Spinal stenosis of lumbar region 07/12/2017  . Osteoporosis 06/20/2016  . Malignant neoplasm of lower-inner quadrant of right breast of female, estrogen receptor positive ([place]) 06/11/2016  . Cephalalgia 03/04/2016  . Diplopia 09/02/2015  . Neck pain on right side 09/02/2015  . Balance disorder 06/05/2015  . Neck pain 05/15/2015  . Headache 05/15/2015  . Postoperative anemia due to acute blood loss 08/28/2013  . Anemia 08/28/2013  . OA (osteoarthritis) of knee 04/27/2013  . 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 Vaginal atrophy 05/25/2012  . CONSTIPATION 08/27/2009  . ULCERATION OF INTESTINE 08/27/2009    Latronda Spink,CHRIS, PTA 01/06/2018, 12:33 PM  [facility] [place], [place], [address] Phone: [phone]   Fax:  [phone]  Name: ADONIA PORADA MRN: [id number] Date of Birth: [birth year]

## 2018-01-12 ENCOUNTER — Ambulatory Visit: Payer: Medicare Other | Admitting: *Deleted

## 2018-01-12 DIAGNOSIS — G8929 Other chronic pain: Secondary | ICD-10-CM

## 2018-01-12 DIAGNOSIS — M6281 Muscle weakness (generalized): Secondary | ICD-10-CM

## 2018-01-12 DIAGNOSIS — R279 Unspecified lack of coordination: Secondary | ICD-10-CM

## 2018-01-12 DIAGNOSIS — M545 Low back pain: Secondary | ICD-10-CM | POA: Diagnosis not present

## 2018-01-12 NOTE — Therapy (Signed)
[facility] [place], [place], [address] Phone: [phone]   Fax:  [phone]  Physical Therapy Treatment  Patient Details  Name: Maria Jackson MRN: [id number] Date of Birth: Sep 14, 1945 Referring Provider: Melina Schools MD.   Encounter Date: 01/12/2018  PT End of Session - 01/12/18 1311    Visit Number  3    Number of Visits  8    Date for PT Re-Evaluation  01/31/18    PT Start Time  1115    PT Stop Time  8527    PT Time Calculation (min)  50 min       Past Medical History:  Diagnosis Date  . Allergy   . Arthritis    knees  . 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 Cancer ([place]) 11/05/11   SQUAMOS CELL CARCINOMA - LEFT CALF  . Cataract   . Chronic lower back pain   . Concussion   . Diplopia    left eye - when she looks down  . Head injury, closed, with concussion   . Insomnia    NO LONGER AN ISSUE  . Malignant neoplasm of lower-inner quadrant of right female breast ([place]) 06/11/2016  . Osteopenia   . Osteoporosis   . Raynaud disease   . Seasonal allergies   . Spondylolisthesis    lumbar    Past Surgical History:  Procedure Laterality Date  . ABDOMINAL EXPOSURE N/A 09/21/2017   Procedure: ABDOMINAL EXPOSURE;  Surgeon: Rosetta Posner, MD;  Location: [facility] OR;  Service: Vascular;  Laterality: N/A;  . ANTERIOR LUMBAR FUSION N/A 09/21/2017   Procedure: ANTERIOR LUMBAR FUSION L4-5;  Surgeon: Melina Schools, MD;  Location: [place];  Service: Orthopedics;  Laterality: N/A;  3 hrs  . APPENDECTOMY    . BREAST LUMPECTOMY WITH RADIOACTIVE SEED LOCALIZATION Right 07/14/2016   Procedure: RIGHT BREAST LUMPECTOMY WITH RADIOACTIVE SEED LOCALIZATION;  Surgeon: Autumn Messing III, MD;  Location: [place];  Service: General;  Laterality: Right;  . CHOLECYSTECTOMY    . COLONOSCOPY    . ENDOSCOPIC VEIN LASER TREATMENT     calf riht and left  . FOOT NEUROMA SURGERY     bilateral  . JOINT REPLACEMENT    . TONSILLECTOMY    .  TOTAL KNEE ARTHROPLASTY Left 04/27/2013   Procedure: LEFT TOTAL KNEE ARTHROPLASTY;  Surgeon: Gearlean Alf, MD;  Location: [facility];  Service: Orthopedics;  Laterality: Left;  . TOTAL KNEE ARTHROPLASTY Right 08/27/2013   Procedure: RIGHT TOTAL KNEE ARTHROPLASTY;  Surgeon: Gearlean Alf, MD;  Location: [facility];  Service: Orthopedics;  Laterality: Right;  . TUBAL LIGATION    . VAGINAL HYSTERECTOMY     tah/bso  . WISDOM TOOTH EXTRACTION      There were no vitals filed for this visit.  Subjective Assessment - 01/12/18 1148    Subjective  Doing fairly well today. LBP is 2-3/10. Doing HEP    Pertinent History  Bilateral TKA's; h/o low back pain.    How long can you stand comfortably?  10-15 minutes.    Patient Stated Goals  Get back to normal life.    Currently in Pain?  Yes    Pain Score  3     Pain Location  Back    Pain Orientation  Left    Pain Descriptors / Indicators  Aching    Pain Type  Chronic pain    Pain Onset  More than a month ago  Pain Frequency  Intermittent                       [facility] Adult PT Treatment/Exercise - 01/12/18 0001      Lumbar Exercises: Standing   Row  Strengthening;20 reps XTS green    Other Standing Lumbar Exercises  balance on rocker board x 5 mins    Other Standing Lumbar Exercises  Lat pulldown green band XTS x 20      Knee/Hip Exercises: Aerobic   Nustep  Level 4 with cues for correct posture and draw-in x 15 minutes.    Other Aerobic  UBE x 3 mins 120 RPM      Knee/Hip Exercises: Standing   Hip Flexion  Stengthening;Both;1 set;20 reps    Hip Abduction  1 set;10 reps;Stengthening;Both    Forward Step Up  2 sets;10 reps               PT Short Term Goals - 01/03/18 1335      PT SHORT TERM GOAL #1   Title  STG's=LTG's.        PT Long Term Goals - 01/12/18 1258      PT LONG TERM GOAL #1   Title  Ind with a HEP.    Baseline  still learning    Time  4    Period  Weeks    Status  On-going      PT LONG TERM  GOAL #2   Title  Patient report a 50% greater ease in performing ADL's.    Baseline  80% better    Time  4    Period  Weeks    Status  On-going      PT LONG TERM GOAL #3   Title  fecal leakage decreased >/= 75% due to increased strength and tone of the pelvic floor    Baseline  decreased by 70% better,     Time  8    Period  Weeks    Status  Achieved      PT LONG TERM GOAL #4   Title  understand how to left and exercise with pelvic floor contraction and no bearing down due to no valsalva manuever    Time  8    Period  Weeks    Status  Achieved            Plan - 01/12/18 1312    Clinical Impression Statement  Pt arrived today doing very well with low pain levels 2-3/10 LB. Rx focused on core activation and posture during therex as well as balance. She did great and reports minimal discomfort during Rx. Pt reports performing HEP at home and doing well.. MD f/u in 2 weeks.    Clinical Presentation  Stable    Rehab Potential  Excellent    PT Frequency  2x / week    PT Duration  4 weeks    PT Treatment/Interventions  ADLs/Self Care Home Management;Cryotherapy;Electrical Stimulation;Ultrasound;Moist Heat;Functional mobility training;Therapeutic activities;Therapeutic exercise;Patient/family education;Manual techniques    PT Next Visit Plan  Core exercise progression with no extension past neutral.  Body mechanics training.  Modalites if needed.    Consulted and Agree with Plan of Care  Patient       Patient will benefit from skilled therapeutic intervention in order to improve the following deficits and impairments:  Decreased activity tolerance, Pain  Visit Diagnosis: Chronic bilateral low back pain, with sciatica presence unspecified  Muscle weakness (generalized)  Unspecified  lack of coordination     Problem List Patient Active Problem List   Diagnosis Date Noted  . 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 Localized, primary osteoarthritis 12/01/2017  . S/P lumbar  fusion 09/21/2017  . Acute otalgia, right 08/29/2017  . Bilateral impacted cerumen 08/29/2017  . Gastroesophageal reflux disease without esophagitis 08/29/2017  . Globus pharyngeus 08/29/2017  . Rhinitis, chronic 08/29/2017  . Lumbar adjacent segment disease with spondylolisthesis 07/12/2017  . Degeneration of lumbar intervertebral disc 07/12/2017  . History of back pain 07/12/2017  . Spinal stenosis of lumbar region 07/12/2017  . Osteoporosis 06/20/2016  . Malignant neoplasm of lower-inner quadrant of right breast of female, estrogen receptor positive ([place]) 06/11/2016  . Cephalalgia 03/04/2016  . Diplopia 09/02/2015  . Neck pain on right side 09/02/2015  . Balance disorder 06/05/2015  . Neck pain 05/15/2015  . Headache 05/15/2015  . Postoperative anemia due to acute blood loss 08/28/2013  . Anemia 08/28/2013  . OA (osteoarthritis) of knee 04/27/2013  . 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 Vaginal atrophy 05/25/2012  . CONSTIPATION 08/27/2009  . ULCERATION OF INTESTINE 08/27/2009    Jovonne Wilton,CHRIS, PTA 01/12/2018, 1:17 PM  [facility] [place], [place], [address] Phone: [phone]   Fax:  [phone]  Name: MAMIE HUNDERTMARK MRN: [id number] Date of Birth: 24-Nov-1945

## 2018-01-24 DIAGNOSIS — Z4889 Encounter for other specified surgical aftercare: Secondary | ICD-10-CM | POA: Diagnosis not present

## 2018-02-13 DIAGNOSIS — M810 Age-related osteoporosis without current pathological fracture: Secondary | ICD-10-CM | POA: Diagnosis not present

## 2018-02-24 DIAGNOSIS — M4726 Other spondylosis with radiculopathy, lumbar region: Secondary | ICD-10-CM | POA: Diagnosis not present

## 2018-02-24 DIAGNOSIS — M5416 Radiculopathy, lumbar region: Secondary | ICD-10-CM | POA: Diagnosis not present

## 2018-02-24 DIAGNOSIS — M5441 Lumbago with sciatica, right side: Secondary | ICD-10-CM | POA: Diagnosis not present

## 2018-03-11 DIAGNOSIS — Z23 Encounter for immunization: Secondary | ICD-10-CM | POA: Diagnosis not present

## 2018-03-20 DIAGNOSIS — Z79899 Other long term (current) drug therapy: Secondary | ICD-10-CM | POA: Diagnosis not present

## 2018-03-20 DIAGNOSIS — R82998 Other abnormal findings in urine: Secondary | ICD-10-CM | POA: Diagnosis not present

## 2018-03-20 DIAGNOSIS — M810 Age-related osteoporosis without current pathological fracture: Secondary | ICD-10-CM | POA: Diagnosis not present

## 2018-03-21 DIAGNOSIS — D0511 Intraductal carcinoma in situ of right breast: Secondary | ICD-10-CM | POA: Diagnosis not present

## 2018-03-27 DIAGNOSIS — D0511 Intraductal carcinoma in situ of right breast: Secondary | ICD-10-CM | POA: Diagnosis not present

## 2018-03-27 DIAGNOSIS — M5489 Other dorsalgia: Secondary | ICD-10-CM | POA: Diagnosis not present

## 2018-03-27 DIAGNOSIS — M810 Age-related osteoporosis without current pathological fracture: Secondary | ICD-10-CM | POA: Diagnosis not present

## 2018-03-27 DIAGNOSIS — Z0000 Encounter for general adult medical examination without abnormal findings: Secondary | ICD-10-CM | POA: Diagnosis not present

## 2018-03-30 DIAGNOSIS — Z1212 Encounter for screening for malignant neoplasm of rectum: Secondary | ICD-10-CM | POA: Diagnosis not present

## 2018-05-08 IMAGING — RF DG LUMBAR SPINE 2-3V
1 series · 2 of 2 positions shown · non-contrast
Comparison: Coronal and sagittal reconstructed images through the
lumbar spine from a CT scan November 26, 2016

CLINICAL DATA: Status post a LIF of L4-5. Reported fluoro time is
55 seconds.

EXAM:
DG C-ARM 61-120 MIN; LUMBAR SPINE - 2-3 VIEW

[Series 1: run · 2 of 2 slices shown]
[im 1/2]
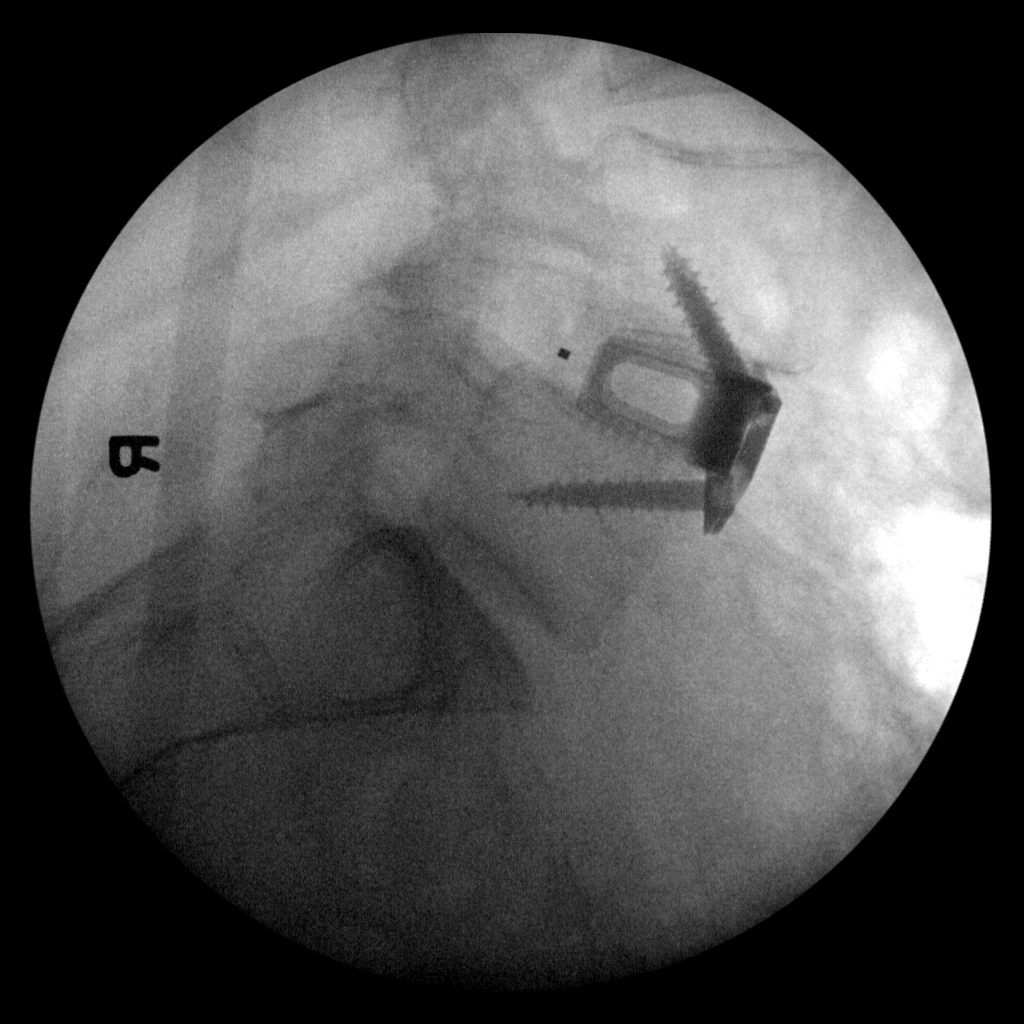
[im 2/2]
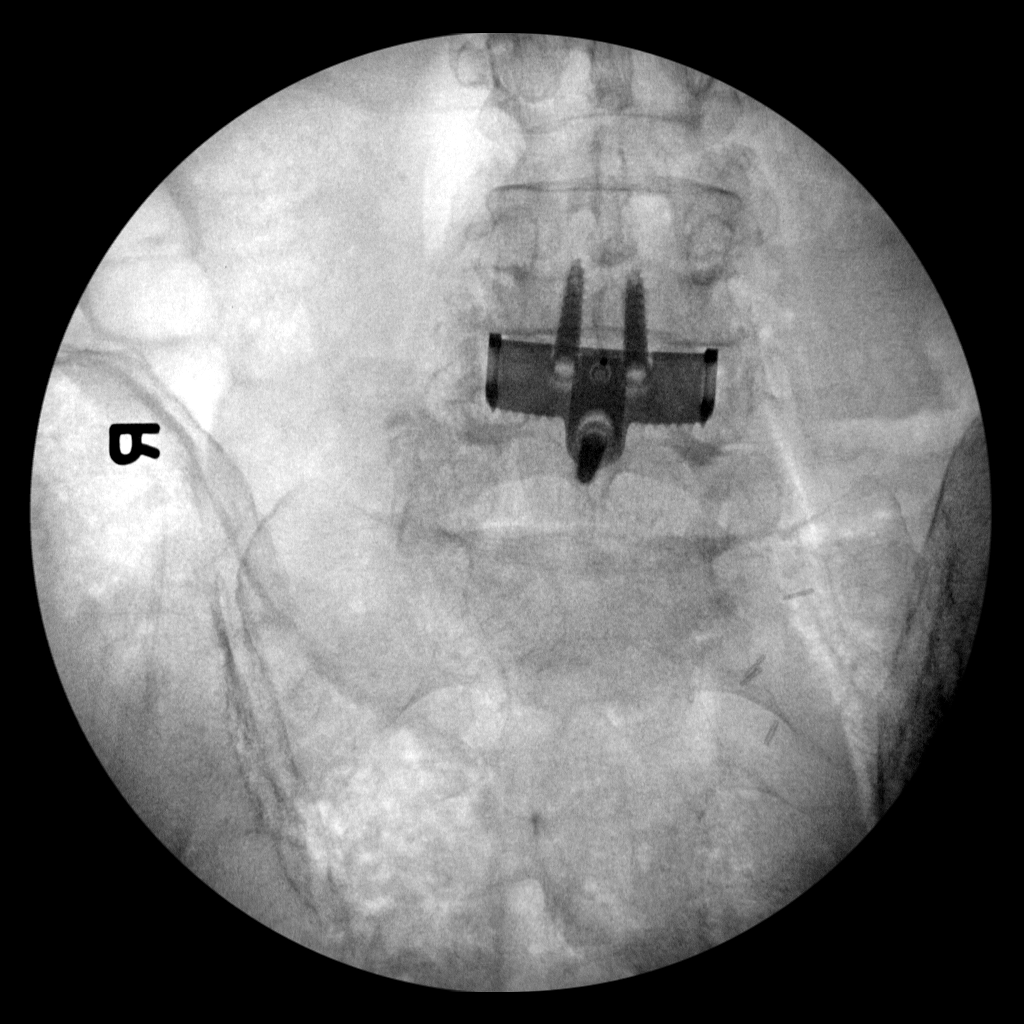

[2 of 2 positions shown; findings below may reference images not displayed]

FINDINGS: The patient has undergone anterior fusion at L4-5 with an
intradiscal device placement. The obliquely oriented cortical screws
appear appropriately positioned. There is stable grade 1
anterolisthesis of L4 with respect L5.
IMPRESSION: ALIF at L4-5 without postprocedure complication.

## 2018-05-08 IMAGING — CR DG OR LOCAL ABDOMEN
1 series · 1 of 1 positions shown · non-contrast
Comparison: CT Abdomen and Pelvis 11/26/2016.

CLINICAL DATA: 72-year-old female undergoing anterior lumbar
fusion. Query retained instruments.

EXAM:
OR LOCAL ABDOMEN

[AP]
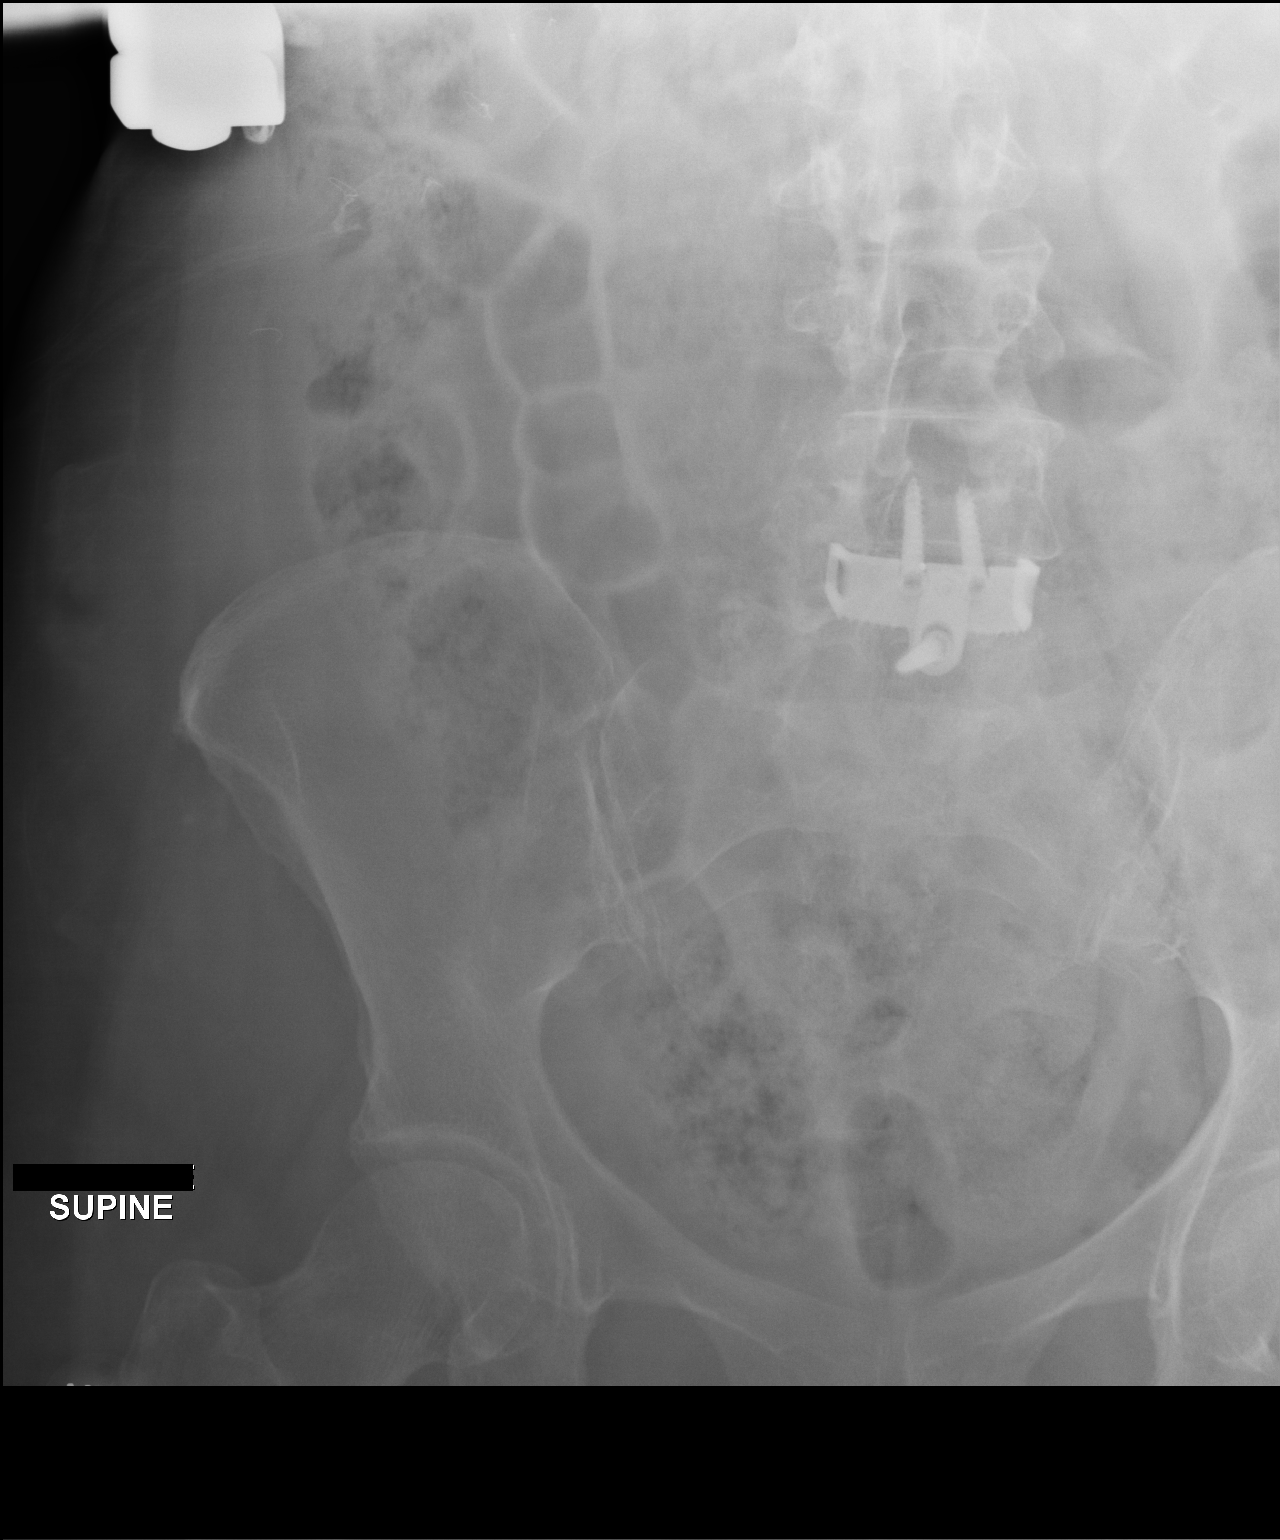

[1 of 1 positions shown; findings below may reference images not displayed]

FINDINGS: Portable AP supine view at 1110 hours. L4-L5 level anterior approach
spinal fusion hardware/implant is identified. Preexisting right
abdominal suture material. No other No radiopaque foreign body
identified. Visible bowel gas pattern within normal limits.
IMPRESSION: No retained operative instrument or unexpected radiopaque foreign
body.

This was telephoned to RN Christofer Zeng in the [facility] 09/21/2017 at [DATE]
.

## 2018-06-06 DIAGNOSIS — Z981 Arthrodesis status: Secondary | ICD-10-CM | POA: Diagnosis not present

## 2018-06-06 DIAGNOSIS — M259 Joint disorder, unspecified: Secondary | ICD-10-CM | POA: Diagnosis not present

## 2018-06-09 ENCOUNTER — Encounter: Payer: Self-pay | Admitting: Oncology

## 2018-06-09 DIAGNOSIS — M8589 Other specified disorders of bone density and structure, multiple sites: Secondary | ICD-10-CM | POA: Diagnosis not present

## 2018-06-09 DIAGNOSIS — Z90710 Acquired absence of both cervix and uterus: Secondary | ICD-10-CM | POA: Diagnosis not present

## 2018-06-09 DIAGNOSIS — Z96653 Presence of artificial knee joint, bilateral: Secondary | ICD-10-CM | POA: Diagnosis not present

## 2018-06-09 DIAGNOSIS — Z853 Personal history of malignant neoplasm of breast: Secondary | ICD-10-CM | POA: Diagnosis not present

## 2018-06-09 DIAGNOSIS — M810 Age-related osteoporosis without current pathological fracture: Secondary | ICD-10-CM | POA: Diagnosis not present

## 2018-06-15 ENCOUNTER — Other Ambulatory Visit: Payer: Self-pay | Admitting: Orthopedic Surgery

## 2018-06-15 DIAGNOSIS — M259 Joint disorder, unspecified: Secondary | ICD-10-CM

## 2018-06-29 ENCOUNTER — Ambulatory Visit
Admission: RE | Admit: 2018-06-29 | Discharge: 2018-06-29 | Disposition: A | Discharge status: Home / Self Care | Payer: Medicare Other | Source: Ambulatory Visit | Attending: Orthopedic Surgery | Admitting: Orthopedic Surgery

## 2018-06-29 DIAGNOSIS — M533 Sacrococcygeal disorders, not elsewhere classified: Secondary | ICD-10-CM | POA: Diagnosis not present

## 2018-06-29 DIAGNOSIS — M259 Joint disorder, unspecified: Secondary | ICD-10-CM

## 2018-06-30 DIAGNOSIS — Z6828 Body mass index (BMI) 28.0-28.9, adult: Secondary | ICD-10-CM | POA: Diagnosis not present

## 2018-06-30 DIAGNOSIS — J189 Pneumonia, unspecified organism: Secondary | ICD-10-CM | POA: Diagnosis not present

## 2018-06-30 DIAGNOSIS — R05 Cough: Secondary | ICD-10-CM | POA: Diagnosis not present

## 2018-07-05 HISTORY — PX: BACK SURGERY: SHX140

## 2018-07-07 DIAGNOSIS — Z6829 Body mass index (BMI) 29.0-29.9, adult: Secondary | ICD-10-CM | POA: Diagnosis not present

## 2018-07-07 DIAGNOSIS — J189 Pneumonia, unspecified organism: Secondary | ICD-10-CM | POA: Diagnosis not present

## 2018-07-07 DIAGNOSIS — R05 Cough: Secondary | ICD-10-CM | POA: Diagnosis not present

## 2018-07-24 DIAGNOSIS — M5136 Other intervertebral disc degeneration, lumbar region: Secondary | ICD-10-CM | POA: Diagnosis not present

## 2018-07-24 DIAGNOSIS — M545 Low back pain: Secondary | ICD-10-CM | POA: Diagnosis not present

## 2018-07-26 DIAGNOSIS — H6123 Impacted cerumen, bilateral: Secondary | ICD-10-CM | POA: Diagnosis not present

## 2018-07-26 DIAGNOSIS — H938X1 Other specified disorders of right ear: Secondary | ICD-10-CM | POA: Diagnosis not present

## 2018-07-26 DIAGNOSIS — H9201 Otalgia, right ear: Secondary | ICD-10-CM | POA: Diagnosis not present

## 2018-07-27 DIAGNOSIS — M5136 Other intervertebral disc degeneration, lumbar region: Secondary | ICD-10-CM | POA: Diagnosis not present

## 2018-08-01 DIAGNOSIS — M5136 Other intervertebral disc degeneration, lumbar region: Secondary | ICD-10-CM | POA: Diagnosis not present

## 2018-08-04 DIAGNOSIS — J189 Pneumonia, unspecified organism: Secondary | ICD-10-CM | POA: Diagnosis not present

## 2018-08-04 DIAGNOSIS — M5416 Radiculopathy, lumbar region: Secondary | ICD-10-CM | POA: Diagnosis not present

## 2018-08-04 DIAGNOSIS — M5489 Other dorsalgia: Secondary | ICD-10-CM | POA: Diagnosis not present

## 2018-08-04 DIAGNOSIS — Z01818 Encounter for other preprocedural examination: Secondary | ICD-10-CM | POA: Diagnosis not present

## 2018-08-16 DIAGNOSIS — M810 Age-related osteoporosis without current pathological fracture: Secondary | ICD-10-CM | POA: Diagnosis not present

## 2018-08-17 DIAGNOSIS — M810 Age-related osteoporosis without current pathological fracture: Secondary | ICD-10-CM | POA: Diagnosis not present

## 2018-08-22 DIAGNOSIS — H9201 Otalgia, right ear: Secondary | ICD-10-CM | POA: Diagnosis not present

## 2018-08-22 DIAGNOSIS — J310 Chronic rhinitis: Secondary | ICD-10-CM | POA: Diagnosis not present

## 2018-08-23 ENCOUNTER — Telehealth: Payer: Self-pay | Admitting: Oncology

## 2018-08-23 NOTE — Telephone Encounter (Signed)
[facility] CME 3/12 - moved appointments from 3/12 to 3/31. Spoke with patient.

## 2018-09-04 ENCOUNTER — Ambulatory Visit: Payer: Self-pay | Admitting: Orthopedic Surgery

## 2018-09-13 DIAGNOSIS — H5213 Myopia, bilateral: Secondary | ICD-10-CM | POA: Diagnosis not present

## 2018-09-14 ENCOUNTER — Other Ambulatory Visit: Payer: Medicare Other

## 2018-09-14 ENCOUNTER — Ambulatory Visit: Payer: Medicare Other | Admitting: Oncology

## 2018-09-26 ENCOUNTER — Other Ambulatory Visit: Payer: Self-pay | Admitting: *Deleted

## 2018-09-26 MED ORDER — TAMOXIFEN CITRATE 20 MG PO TABS: 20.0000 mg | ORAL_TABLET | Freq: Every day | ORAL | 1 refills | Status: DC

## 2018-09-27 ENCOUNTER — Inpatient Hospital Stay (HOSPITAL_COMMUNITY): Admission: RE | Admit: 2018-09-27 | Payer: Medicare Other | Source: Ambulatory Visit

## 2018-10-03 ENCOUNTER — Ambulatory Visit: Payer: Medicare Other | Admitting: Oncology

## 2018-10-03 ENCOUNTER — Other Ambulatory Visit: Payer: Medicare Other

## 2018-10-04 ENCOUNTER — Encounter (HOSPITAL_COMMUNITY): Admission: RE | Payer: Self-pay | Source: Home / Self Care

## 2018-10-04 ENCOUNTER — Inpatient Hospital Stay (HOSPITAL_COMMUNITY): Admission: RE | Admit: 2018-10-04 | Payer: Medicare Other | Source: Home / Self Care | Admitting: Orthopedic Surgery

## 2018-10-04 SURGERY — FUSION, SPINE, 2 OR MORE LEVELS, POSTERIOR APPROACH
Anesthesia: General

## 2018-11-21 ENCOUNTER — Ambulatory Visit: Payer: Self-pay | Admitting: Orthopedic Surgery

## 2018-11-24 DIAGNOSIS — M545 Low back pain: Secondary | ICD-10-CM | POA: Diagnosis not present

## 2018-11-28 ENCOUNTER — Other Ambulatory Visit: Payer: Self-pay | Admitting: Oncology

## 2018-11-30 DIAGNOSIS — Z981 Arthrodesis status: Secondary | ICD-10-CM | POA: Diagnosis not present

## 2018-12-15 ENCOUNTER — Ambulatory Visit: Payer: Self-pay | Admitting: Orthopedic Surgery

## 2018-12-15 NOTE — H&P (Signed)
Subjective:   Maria Jackson is a pleasant 73 year old female with past medical history significant for an L5/S1 anterior lumbar interbody fusion who unfortunately need to have low back pain and radicular left leg pain following surgical procedure. Despite conservative measures To include injection and medications the patients quality-of-life has continued to deteriorate and has elected to move forward with surgical intervention.  Original surgery date was scheduled April 1, but has been postponed due to coronavirus and rescheduled for the above-mentioned date. Since the original surgery date was scheduled, the patient has began experiencing increased weakness of her left lower extremity.  The patient is here today for a pre-operative History and Physical. They are scheduled for  PSFI L4-S1, POST DECOMPRESSION L4-5 on 12-27-18 with Dr. Rolena Infante at [facility].  Patient Active Problem List   Diagnosis Date Noted  . 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 Localized, primary osteoarthritis 12/01/2017  . S/P lumbar fusion 09/21/2017  . Acute otalgia, right 08/29/2017  . Bilateral impacted cerumen 08/29/2017  . Gastroesophageal reflux disease without esophagitis 08/29/2017  . Globus pharyngeus 08/29/2017  . Rhinitis, chronic 08/29/2017  . Lumbar adjacent segment disease with spondylolisthesis 07/12/2017  . Degeneration of lumbar intervertebral disc 07/12/2017  . History of back pain 07/12/2017  . Spinal stenosis of lumbar region 07/12/2017  . Osteoporosis 06/20/2016  . Malignant neoplasm of lower-inner quadrant of right breast of female, estrogen receptor positive ([place]) 06/11/2016  . Cephalalgia 03/04/2016  . Diplopia 09/02/2015  . Neck pain on right side 09/02/2015  . Balance disorder 06/05/2015  . Neck pain 05/15/2015  . Headache 05/15/2015  . Postoperative anemia due to acute blood loss 08/28/2013  . Anemia 08/28/2013  . OA (osteoarthritis) of knee 04/27/2013  . Squamous cell carcinoma  of lower leg 06/07/2012  . Vaginal atrophy 05/25/2012  . CONSTIPATION 08/27/2009  . ULCERATION OF INTESTINE 08/27/2009   Past Medical History:  Diagnosis Date  . Allergy   . Arthritis    knees  . 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 Cancer ([place]) 11/05/11   SQUAMOS CELL CARCINOMA - LEFT CALF  . Cataract   . Chronic lower back pain   . Concussion   . Diplopia    left eye - when she looks down  . Head injury, closed, with concussion   . Insomnia    NO LONGER AN ISSUE  . Malignant neoplasm of lower-inner quadrant of right female breast ([place]) 06/11/2016  . Osteopenia   . Osteoporosis   . Raynaud disease   . Seasonal allergies   . Spondylolisthesis    lumbar    Past Surgical History:  Procedure Laterality Date  . ABDOMINAL EXPOSURE N/A 09/21/2017   Procedure: ABDOMINAL EXPOSURE;  Surgeon: Rosetta Posner, MD;  Location: [facility] OR;  Service: Vascular;  Laterality: N/A;  . ANTERIOR LUMBAR FUSION N/A 09/21/2017   Procedure: ANTERIOR LUMBAR FUSION L4-5;  Surgeon: Melina Schools, MD;  Location: [place];  Service: Orthopedics;  Laterality: N/A;  3 hrs  . APPENDECTOMY    . BREAST LUMPECTOMY WITH RADIOACTIVE SEED LOCALIZATION Right 07/14/2016   Procedure: RIGHT BREAST LUMPECTOMY WITH RADIOACTIVE SEED LOCALIZATION;  Surgeon: Autumn Messing III, MD;  Location: [place];  Service: General;  Laterality: Right;  . CHOLECYSTECTOMY    . COLONOSCOPY    . ENDOSCOPIC VEIN LASER TREATMENT     calf riht and left  . FOOT NEUROMA SURGERY     bilateral  . JOINT REPLACEMENT    . TONSILLECTOMY    .  TOTAL KNEE ARTHROPLASTY Left 04/27/2013   Procedure: LEFT TOTAL KNEE ARTHROPLASTY;  Surgeon: Gearlean Alf, MD;  Location: [facility];  Service: Orthopedics;  Laterality: Left;  . TOTAL KNEE ARTHROPLASTY Right 08/27/2013   Procedure: RIGHT TOTAL KNEE ARTHROPLASTY;  Surgeon: Gearlean Alf, MD;  [facility]: [facility];  Service: Orthopedics;  Laterality: Right;  . TUBAL LIGATION    . VAGINAL HYSTERECTOMY     tah/bso  .  WISDOM TOOTH EXTRACTION      Current Outpatient Medications  Medication Sig Dispense Refill Last Dose  . acetaminophen (TYLENOL) 500 MG tablet Take 500 mg by mouth daily as needed for moderate pain or headache.     . Carboxymethylcellul-Glycerin (LUBRICATING EYE DROPS OP) Place 1 drop into both eyes daily as needed (dry eyes).     . Cholecalciferol (VITAMIN D3) 2000 units TABS Take 2,000 Units by mouth daily.    Taking  . conjugated estrogens (PREMARIN) vaginal cream Place 0.5 Applicatorfuls vaginally every Thursday.    Taking  . cycloSPORINE (RESTASIS) 0.05 % ophthalmic emulsion Place 1 drop into both eyes 2 (two) times daily.   Taking  . denosumab (PROLIA) 60 MG/ML SOLN injection Inject 60 mg into the skin every 6 (six) months. Administer in upper arm, thigh, or abdomen   Taking  . Desvenlafaxine Succinate ER 25 MG TB24 Take 25 mg by mouth daily.   5 Taking  . diclofenac sodium (VOLTAREN) 1 % GEL Apply 2 g topically 3 (three) times daily as needed (pain).     Marland Kitchen esomeprazole (NEXIUM) 40 MG capsule Take 40 mg by mouth daily as needed (acid reflux).     Marland Kitchen loratadine (CLARITIN) 10 MG tablet Take 10 mg by mouth as needed for allergies.   Taking  . nitrofurantoin, macrocrystal-monohydrate, (MACROBID) 100 MG capsule Take 100 mg by mouth daily as needed (for UTI prevention).   Taking  . sodium chloride (OCEAN) 0.65 % SOLN nasal spray Place 1 spray into both nostrils 4 (four) times daily as needed for congestion.   Taking  . tamoxifen (NOLVADEX) 20 MG tablet Take 1 tablet (20 mg total) by mouth daily. 90 tablet 1   . triamcinolone (NASACORT AQ) 55 MCG/ACT AERO nasal inhaler Place 2 sprays into the nose at bedtime.    Taking   No current facility-administered medications for this visit.    Allergies  Allergen Reactions  . Bee Venom Itching, Swelling and Other (See Comments)    REDNESS   . Ibuprofen Rash and Other (See Comments)    Mouth ulcers  . Cephalexin     UNSPECIFIED REACTION   .  Penicillins     Childhood allergy Did it involve swelling of the face/tongue/throat, SOB, or low BP? Unknown Did it involve sudden or severe rash/hives, skin peeling, or any reaction on the inside of your mouth or nose? Unknown Did you need to seek medical attention at a hospital or doctor's office? Unknown When did it last happen?childhood allergy If all above answers are "NO", may proceed with cephalosporin use.   . Sulfonamide Derivatives Rash    Social History   Tobacco Use  . Smoking status: Never Smoker  . Smokeless tobacco: Never Used  Substance Use Topics  . Alcohol use: Yes    Alcohol/week: 2.0 standard drinks    Types: 2 Glasses of wine per week    Comment: red wine...  1/3 GLASS at dinner    Family History  Problem Relation Age of Onset  . Diabetes Mother  CVA - 07/06/16  . Heart disease Mother   . Heart disease Father        Cancer of eye, Multiple Mylemona  . Colon cancer Neg Hx   . Esophageal cancer Neg Hx   . Stomach cancer Neg Hx   . Rectal cancer Neg Hx     Review of Systems As stated in HPI  Objective:   General: AAOX3, well developed and well nourished, NAD  Ambulation abnormal due to left foot drop; uses no assitive devices  Heart: RRR, no rubs, murmers, or gallops.  Lungs: CTAB  Abdomen: Normal bowel soundsX4, soft, non-tender, no hepatosplenomegaly.  ROM: - Knee: flexion and extension normal and pain free bilaterally. - Ankle: Dorsiflexion, plantarflexion, inversion, eversion normal and pain free.  Dermatomes: Lower extremity sensation to light touch abnormal with positive dysathesias on the left.  Myotomes: - Hip Flexion: Left 4+/5, Right 5/5 - Knee Extension: Left 5/5, Right 5/5 - Knee Flexion: Left 5/5, Right 5/5 - Ankle Dorsiflextion: Left 4/5, Right 5/5 - Ankle Plantarflexion: Left 5/5, Right 5/5  Reflexes: - Patella: Left2+, Right 2+ - Achilles: Left2+, Right 2+ - Babinski: Left Ngative, Right Negative - Clonus:  Negative  Special Tests: - Straight Leg Raise: Left Negative, Right Negative  PV: Extremities warm and well profused. Posterior and dorsalis pedis pulse 2+ bilaterally, No pitting Edema, discoloration, calf tenderness, or palpable cords. Homan's negative bilaterally.  MRI: Updated MRI with contrast of lumbar spine dated 11/30/2018 compared to previous MRI of lumbar spine and there were no significant changes noted. Lumbar MRI: completed on 07/27/18 was reviewed with the patient. I have also reviewed the radiology report. Progressive disc degeneration at L5-S1 is noted. Slight disc bulge but no neural displacement. No significant stenosis is noted. L4-5 grade 2 spondylolisthesis moderate central stenosis bilateral lateral recess stenosis with mass-effect on both L5 nerve roots significant bilateral facet arthropathy is noted.  Assessment:   Maria Jackson is a pleasant 73 year old female with past medical history significant for an L5/S1 anterior lumbar interbody fusion who is scheduled for They are scheduled for PSFI L4-S1, POST DECOMPRESSION L4-5 on 12-27-18 with Dr. Rolena Infante at [facility] to treat her progressive motor deficits but the dysesthesias and neuropathic pain which are significantly hindering her quality-of-life. Based on the new MRI she does not have significant neural compression at L5-S1 but there is progressive degenerative disc disease at L5-S1. I believe this is contributing to her overall back pain. There is recurrent spinal stenosis due to the collapse of the L4-5 vertebral cage and as a result she is having increasing neuropathic leg pain.  Plan:   To address the recurrent stenosis I recommended an L4-5 decompression. In order to address the degenerative disc disease of recommended a posterior instrument effusion from L4-S1.  I will also use an external bone stimulator given the fact that this is a revision fusion procedure.  I reviewed the risks of surgery and they have also  understood these. Risks and benefits of surgery were discussed with the patient. These include: Infection, bleeding, death, stroke, paralysis, ongoing or worse pain, need for additional surgery, nonunion, leak of spinal fluid, adjacent segment degeneration requiring additional fusion surgery, Injury to abdominal vessels that can require anterior surgery to stop bleeding. Malposition of the cage and/or pedicle screws that could require additional surgery. Loss of bowel and bladder control. Postoperative hematoma causing neurologic compression that could require urgent or emergent re-operation.  We have also discussed the goals of surgery to include: Goals  of surgery: Reduction in pain, and improvement in quality of life.  We have also discussed the post-operative recovery period to include: bathing/showering restrictions, wound healing, activity (and driving) restrictions, medications/pain mangement.  We have also discussed post-operative redflags to include: signs and symptoms of postoperative infection, DVT/PE.  **Given that her most recent preoperative clearance by her PCP is from February, we will attempt to get an updated documentation that states there are no significant changes to the patient's medical history and that it is safe to move forward with surgical intervention.**  Follow-up: 2 weeks status post surgery  Note dictated by Cleta Alberts PA-C, patient seen injection with Dr. Rolena Infante.

## 2018-12-15 NOTE — H&P (Deleted)
  The note originally documented on this encounter has been moved the the encounter in which it belongs.  

## 2018-12-21 NOTE — Progress Notes (Addendum)
Patient informed of the [place] that is currently in effect.  Patient verbalized understanding.  Patient denies shortness of breath, fever, cough and chest pain at PAT appointment  PCP - Dr Marton Redwood  Cardiologist - Denies  Chest x-ray - 12/22/18 EKG - 12/22/18 Stress Test - Denies ECHO - Denies Cardiac Cath -Denies   Sleep Study - Denies CPAP - N/A  Aspirin Instructions:Last Dose 12/16/18 per MD  Anesthesia review: Anesthesia Consult ordered by MD with PAT appointment- Jeneen Rinks, PA reviewed patient's chart, informed of EKG reading-OK.  Per Jeneen Rinks, [place], no need to see patient at PAT appointment.  STOP now taking any Aleve, Naproxen, Ibuprofen, Motrin, Advil, Goody's, BC's, all herbal medications, fish oil, and all vitamins.  Coronavirus Screening Have you or your husband experienced the following symptoms:  Cough yes/no: No Fever (>100.53F)  yes/no: No Runny nose yes/no: No Sore throat yes/no: No Difficulty breathing/shortness of breath  yes/no: No  Have you or your husband  traveled in the last 14 days and where? yes/no: No

## 2018-12-21 NOTE — Progress Notes (Signed)
[place], [address] [place] [address] Phone: [phone] Fax: [phone]      Your procedure is scheduled on Wednesday, 6/24.  Report to [facility] Main Entrance "A" at 5:30 am, and check in at the Admitting office.  Call this number if you have problems the morning of surgery:  [phone]  Call [phone] if you have any questions prior to your surgery date Monday-Friday 8am-4pm    Remember:  Do not eat or drink after midnight Tuesday.    Take these medicines the morning of surgery with A SIP OF WATER : acetaminophen (TYLENOL) if needed cycloSPORINE (RESTASIS)  If needed Carboxymethylcellul-Glycerin (LUBRICATING EYE DROPS) if needed Desvenlafaxine Succinate ER  esomeprazole (NEXIUM) if needed loratadine (CLARITIN) if needed sodium chloride (OCEAN) 0.65 % SOLN nasal spray if needed triamcinolone (NASACORT AQ) 55 MCG/ACT AERO nasal inhaler if needed  STOP now taking any Aspirin (unless otherwise instructed by your surgeon), Aleve, Naproxen, Ibuprofen, Motrin, Advil, Goody's, BC's, all herbal medications, fish oil, and all vitamins.    The Morning of Surgery  Do not wear jewelry, make-up or nail polish.  Do not wear lotions, powders, or perfumes/colognes, or deodorant  Do not shave 48 hours prior to surgery.  .  Do not bring valuables to the hospital.  [facility] is not responsible for any belongings or valuables.  If you are a smoker, DO NOT Smoke 24 hours prior to surgery  IF you wear a CPAP at night please bring your mask, tubing, and machine the morning of surgery   Remember that you must have someone to transport you home after your surgery, and remain with you for 24 hours if you are discharged the same day.  Patients discharged the day of surgery will not be allowed to drive home.    Contacts, glasses, hearing aids, dentures or bridgework may not be worn into surgery.   For patients admitted to the  hospital, discharge time will be determined by your treatment team.     Special instructions:   [place]- Preparing For Surgery  Before surgery, you can play an important role. Because skin is not sterile, your skin needs to be as free of germs as possible. You can reduce the number of germs on your skin by washing with CHG (chlorahexidine gluconate) Soap before surgery.  CHG is an antiseptic cleaner which kills germs and bonds with the skin to continue killing germs even after washing.    Oral Hygiene is also important to reduce your risk of infection.  Remember - BRUSH YOUR TEETH THE MORNING OF SURGERY WITH YOUR REGULAR TOOTHPASTE  Please do not use if you have an allergy to CHG or antibacterial soaps. If your skin becomes reddened/irritated stop using the CHG.  Do not shave (including legs and underarms) for at least 48 hours prior to first CHG shower. It is OK to shave your face.  Please follow these instructions carefully.   1. Shower the [employer] BEFORE SURGERY (Tues) and the MORNING OF SURGERY (Wed) with CHG Soap.   2. If you chose to wash your hair, wash your hair first as usual with your normal shampoo.  3. After you shampoo, rinse your hair and body thoroughly to remove the shampoo.  4. Use CHG as you would any other liquid soap. You can apply CHG directly to the skin and wash gently with a scrungie or a clean washcloth.   5. Apply the CHG Soap  to your body ONLY FROM THE NECK DOWN.  Do not use on open wounds or open sores. Avoid contact with your eyes, ears, mouth and genitals (private parts). Wash Face and genitals (private parts)  with your normal soap.   6. Wash thoroughly, paying special attention to the area where your surgery will be performed.  7. Thoroughly rinse your body with warm water from the neck down.  8. DO NOT shower/wash with your normal soap after using and rinsing off the CHG Soap.  9. Pat yourself dry with a CLEAN TOWEL.  10. Wear CLEAN PAJAMAS to bed  the night before surgery, wear comfortable clothes the morning of surgery  11. Place CLEAN SHEETS on your bed the night of your first shower and DO NOT SLEEP WITH PETS.    Day of Surgery:  Do not apply any deodorants/lotions.  Please wear clean clothes to the hospital/surgery center.   Remember to brush your teeth WITH YOUR REGULAR TOOTHPASTE.   Please read over the following fact sheets that you were given.

## 2018-12-22 ENCOUNTER — Encounter (HOSPITAL_COMMUNITY)
Admission: RE | Admit: 2018-12-22 | Discharge: 2018-12-22 | Disposition: A | Discharge status: Home / Self Care | Payer: Medicare Other | Source: Ambulatory Visit | Attending: Orthopedic Surgery | Admitting: Orthopedic Surgery

## 2018-12-22 ENCOUNTER — Other Ambulatory Visit: Payer: Self-pay

## 2018-12-22 ENCOUNTER — Encounter (HOSPITAL_COMMUNITY): Payer: Self-pay

## 2018-12-22 ENCOUNTER — Ambulatory Visit (HOSPITAL_COMMUNITY)
Admission: RE | Admit: 2018-12-22 | Discharge: 2018-12-22 | Disposition: A | Discharge status: Home / Self Care | Payer: Medicare Other | Source: Ambulatory Visit | Attending: Orthopedic Surgery | Admitting: Orthopedic Surgery

## 2018-12-22 ENCOUNTER — Other Ambulatory Visit (HOSPITAL_COMMUNITY): Payer: Medicare Other

## 2018-12-22 DIAGNOSIS — Z01818 Encounter for other preprocedural examination: Secondary | ICD-10-CM

## 2018-12-22 DIAGNOSIS — I447 Left bundle-branch block, unspecified: Secondary | ICD-10-CM | POA: Insufficient documentation

## 2018-12-22 HISTORY — DX: Dependence on other enabling machines and devices: Z99.89

## 2018-12-22 HISTORY — DX: Pneumonia, unspecified organism: J18.9

## 2018-12-22 HISTORY — DX: Presence of spectacles and contact lenses: Z97.3

## 2018-12-22 HISTORY — DX: Menopausal and female climacteric states: N95.1

## 2018-12-22 HISTORY — DX: Acquired absence of both cervix and uterus: Z90.710

## 2018-12-22 LAB — CBC
HCT: 39.3 % (ref 36.0–46.0)
Hemoglobin: 13 g/dL (ref 12.0–15.0)
MCH: 34.6 pg — ABNORMAL HIGH (ref 26.0–34.0)
MCHC: 33.1 g/dL (ref 30.0–36.0)
MCV: 104.5 fL — ABNORMAL HIGH (ref 80.0–100.0)
Platelets: 296 10*3/uL (ref 150–400)
RBC: 3.76 MIL/uL — ABNORMAL LOW (ref 3.87–5.11)
RDW: 12.8 % (ref 11.5–15.5)
WBC: 5.4 10*3/uL (ref 4.0–10.5)
nRBC: 0 % (ref 0.0–0.2)

## 2018-12-22 LAB — BASIC METABOLIC PANEL
Anion gap: 8 (ref 5–15)
BUN: 12 mg/dL (ref 8–23)
CO2: 27 mmol/L (ref 22–32)
Calcium: 8.8 mg/dL — ABNORMAL LOW (ref 8.9–10.3)
Chloride: 99 mmol/L (ref 98–111)
Creatinine, Ser: 0.67 mg/dL (ref 0.44–1.00)
GFR calc Af Amer: >60 mL/min (ref >60)
GFR calc non Af Amer: >60 mL/min (ref >60)
Glucose, Bld: 94 mg/dL (ref 70–99)
Potassium: 4.1 mmol/L (ref 3.5–5.1)
Sodium: 134 mmol/L — ABNORMAL LOW (ref 135–145)

## 2018-12-22 LAB — TYPE AND SCREEN
ABO/RH(D): A POS
Antibody Screen: NEGATIVE

## 2018-12-22 LAB — SURGICAL PCR SCREEN
MRSA, PCR: NEGATIVE
Staphylococcus aureus: POSITIVE — AB

## 2018-12-22 LAB — PROTIME-INR
INR: 1.1 (ref 0.8–1.2)
Prothrombin Time: 13.6 seconds (ref 11.4–15.2)

## 2018-12-22 LAB — APTT: aPTT: 26 seconds (ref 24–36)

## 2018-12-22 NOTE — Progress Notes (Signed)
Covid 19 screen rescheduled for 6/22.

## 2018-12-25 ENCOUNTER — Other Ambulatory Visit (HOSPITAL_COMMUNITY)
Admission: RE | Admit: 2018-12-25 | Discharge: 2018-12-25 | Disposition: A | Discharge status: Home / Self Care | Payer: Medicare Other | Source: Ambulatory Visit | Attending: Orthopedic Surgery | Admitting: Orthopedic Surgery

## 2018-12-25 DIAGNOSIS — Z1159 Encounter for screening for other viral diseases: Secondary | ICD-10-CM | POA: Diagnosis not present

## 2018-12-25 LAB — SARS CORONAVIRUS 2 (TAT 6-24 HRS): SARS Coronavirus 2: NEGATIVE

## 2018-12-26 MED ORDER — VANCOMYCIN HCL IN DEXTROSE 1-5 GM/200ML-% IV SOLN
1000.0000 mg | INTRAVENOUS | Status: DC
  Filled 2018-12-26: qty 200

## 2018-12-26 MED ORDER — TRANEXAMIC ACID-NACL 1000-0.7 MG/100ML-% IV SOLN
1000.0000 mg | INTRAVENOUS | Status: AC
  Administered 2018-12-27: 1000 mg via INTRAVENOUS
  Filled 2018-12-26: qty 100

## 2018-12-27 ENCOUNTER — Inpatient Hospital Stay (HOSPITAL_COMMUNITY)
Admission: RE | Admit: 2018-12-27 | Discharge: 2018-12-28 | DRG: 460 | Disposition: A | Discharge status: Home / Self Care | Payer: Medicare Other | Attending: Orthopedic Surgery | Admitting: Orthopedic Surgery

## 2018-12-27 ENCOUNTER — Inpatient Hospital Stay (HOSPITAL_COMMUNITY)
Admission: RE | Disposition: A | Discharge status: Home / Self Care | Payer: Self-pay | Source: Home / Self Care | Attending: Orthopedic Surgery

## 2018-12-27 ENCOUNTER — Inpatient Hospital Stay (HOSPITAL_COMMUNITY): Payer: Medicare Other

## 2018-12-27 ENCOUNTER — Encounter (HOSPITAL_COMMUNITY): Payer: Self-pay | Admitting: *Deleted

## 2018-12-27 ENCOUNTER — Inpatient Hospital Stay (HOSPITAL_COMMUNITY): Payer: Medicare Other | Admitting: Anesthesiology

## 2018-12-27 ENCOUNTER — Other Ambulatory Visit: Payer: Self-pay

## 2018-12-27 ENCOUNTER — Inpatient Hospital Stay (HOSPITAL_COMMUNITY): Payer: Medicare Other | Admitting: Physician Assistant

## 2018-12-27 DIAGNOSIS — M48061 Spinal stenosis, lumbar region without neurogenic claudication: Secondary | ICD-10-CM | POA: Diagnosis present

## 2018-12-27 DIAGNOSIS — Z833 Family history of diabetes mellitus: Secondary | ICD-10-CM | POA: Diagnosis not present

## 2018-12-27 DIAGNOSIS — M5137 Other intervertebral disc degeneration, lumbosacral region: Secondary | ICD-10-CM | POA: Diagnosis not present

## 2018-12-27 DIAGNOSIS — M5136 Other intervertebral disc degeneration, lumbar region: Principal | ICD-10-CM | POA: Diagnosis present

## 2018-12-27 DIAGNOSIS — Z79899 Other long term (current) drug therapy: Secondary | ICD-10-CM | POA: Diagnosis not present

## 2018-12-27 DIAGNOSIS — M79605 Pain in left leg: Secondary | ICD-10-CM | POA: Diagnosis not present

## 2018-12-27 DIAGNOSIS — M4327 Fusion of spine, lumbosacral region: Secondary | ICD-10-CM | POA: Diagnosis not present

## 2018-12-27 DIAGNOSIS — Z8249 Family history of ischemic heart disease and other diseases of the circulatory system: Secondary | ICD-10-CM | POA: Diagnosis not present

## 2018-12-27 DIAGNOSIS — Z1159 Encounter for screening for other viral diseases: Secondary | ICD-10-CM

## 2018-12-27 DIAGNOSIS — M4316 Spondylolisthesis, lumbar region: Secondary | ICD-10-CM | POA: Diagnosis not present

## 2018-12-27 DIAGNOSIS — D649 Anemia, unspecified: Secondary | ICD-10-CM | POA: Diagnosis not present

## 2018-12-27 DIAGNOSIS — Z419 Encounter for procedure for purposes other than remedying health state, unspecified: Secondary | ICD-10-CM

## 2018-12-27 DIAGNOSIS — M5126 Other intervertebral disc displacement, lumbar region: Secondary | ICD-10-CM | POA: Diagnosis not present

## 2018-12-27 LAB — URINALYSIS, ROUTINE W REFLEX MICROSCOPIC
Bacteria, UA: NONE SEEN
Bilirubin Urine: NEGATIVE
Glucose, UA: NEGATIVE mg/dL
Ketones, ur: NEGATIVE mg/dL
Leukocytes,Ua: NEGATIVE
Nitrite: NEGATIVE
Protein, ur: NEGATIVE mg/dL
Specific Gravity, Urine: 1.003 — ABNORMAL LOW (ref 1.005–1.030)
pH: 5 (ref 5.0–8.0)

## 2018-12-27 SURGERY — POSTERIOR LUMBAR FUSION 2 LEVEL
Anesthesia: General | Site: Back

## 2018-12-27 MED ORDER — FENTANYL CITRATE (PF) 100 MCG/2ML IJ SOLN
INTRAMUSCULAR | Status: DC | PRN
  Administered 2018-12-27 (×3): 50 ug via INTRAVENOUS
  Administered 2018-12-27: 100 ug via INTRAVENOUS
  Administered 2018-12-27 (×2): 50 ug via INTRAVENOUS

## 2018-12-27 MED ORDER — DESVENLAFAXINE SUCCINATE ER 25 MG PO TB24
25.0000 mg | ORAL_TABLET | Freq: Every day | ORAL | Status: DC
  Administered 2018-12-28: 25 mg via ORAL
  Filled 2018-12-27: qty 1

## 2018-12-27 MED ORDER — PROPOFOL 10 MG/ML IV BOLUS
INTRAVENOUS | Status: DC | PRN
  Administered 2018-12-27: 50 mg via INTRAVENOUS
  Administered 2018-12-27: 180 mg via INTRAVENOUS
  Administered 2018-12-27: 20 mg via INTRAVENOUS
  Administered 2018-12-27: 50 mg via INTRAVENOUS

## 2018-12-27 MED ORDER — LACTATED RINGERS IV SOLN
INTRAVENOUS | Status: DC
  Administered 2018-12-27 (×2): via INTRAVENOUS

## 2018-12-27 MED ORDER — FENTANYL CITRATE (PF) 250 MCG/5ML IJ SOLN
INTRAMUSCULAR | Status: AC
  Filled 2018-12-27: qty 5

## 2018-12-27 MED ORDER — ONDANSETRON HCL 4 MG/2ML IJ SOLN: 4.0000 mg | Freq: Four times a day (QID) | INTRAMUSCULAR | Status: DC | PRN

## 2018-12-27 MED ORDER — METHOCARBAMOL 500 MG PO TABS
500.0000 mg | ORAL_TABLET | Freq: Four times a day (QID) | ORAL | Status: DC | PRN
  Administered 2018-12-27 – 2018-12-28 (×3): 500 mg via ORAL
  Filled 2018-12-27 (×3): qty 1

## 2018-12-27 MED ORDER — ACETAMINOPHEN 10 MG/ML IV SOLN
INTRAVENOUS | Status: DC | PRN
  Administered 2018-12-27: 1000 mg via INTRAVENOUS

## 2018-12-27 MED ORDER — 0.9 % SODIUM CHLORIDE (POUR BTL) OPTIME
TOPICAL | Status: DC | PRN
  Administered 2018-12-27 (×3): 1000 mL

## 2018-12-27 MED ORDER — MENTHOL 3 MG MT LOZG: 1.0000 | LOZENGE | OROMUCOSAL | Status: DC | PRN

## 2018-12-27 MED ORDER — HEMOSTATIC AGENTS (NO CHARGE) OPTIME
TOPICAL | Status: DC | PRN
  Administered 2018-12-27: 2

## 2018-12-27 MED ORDER — OXYCODONE HCL 5 MG PO TABS: 5.0000 mg | ORAL_TABLET | ORAL | Status: DC | PRN

## 2018-12-27 MED ORDER — THROMBIN (RECOMBINANT) 20000 UNITS EX SOLR
CUTANEOUS | Status: AC
  Filled 2018-12-27: qty 20000

## 2018-12-27 MED ORDER — MORPHINE SULFATE (PF) 2 MG/ML IV SOLN: 2.0000 mg | INTRAVENOUS | Status: DC | PRN

## 2018-12-27 MED ORDER — SODIUM CHLORIDE 0.9 % IV SOLN
INTRAVENOUS | Status: DC | PRN
  Administered 2018-12-27: 25 ug/min via INTRAVENOUS

## 2018-12-27 MED ORDER — LIDOCAINE 2% (20 MG/ML) 5 ML SYRINGE
INTRAMUSCULAR | Status: DC | PRN
  Administered 2018-12-27: 40 mg via INTRAVENOUS

## 2018-12-27 MED ORDER — VANCOMYCIN HCL IN DEXTROSE 1-5 GM/200ML-% IV SOLN
1000.0000 mg | Freq: Once | INTRAVENOUS | Status: AC
  Administered 2018-12-27: 1000 mg via INTRAVENOUS
  Filled 2018-12-27: qty 200

## 2018-12-27 MED ORDER — EPHEDRINE SULFATE 50 MG/ML IJ SOLN
INTRAMUSCULAR | Status: DC | PRN
  Administered 2018-12-27: 5 mg via INTRAVENOUS
  Administered 2018-12-27 (×2): 10 mg via INTRAVENOUS

## 2018-12-27 MED ORDER — SODIUM CHLORIDE 0.9% FLUSH: 3.0000 mL | Freq: Two times a day (BID) | INTRAVENOUS | Status: DC

## 2018-12-27 MED ORDER — HYDROMORPHONE HCL 1 MG/ML IJ SOLN
INTRAMUSCULAR | Status: AC
  Administered 2018-12-27: 0.25 mg via INTRAVENOUS
  Filled 2018-12-27: qty 1

## 2018-12-27 MED ORDER — PHENOL 1.4 % MT LIQD: 1.0000 | OROMUCOSAL | Status: DC | PRN

## 2018-12-27 MED ORDER — PHENYLEPHRINE 40 MCG/ML (10ML) SYRINGE FOR IV PUSH (FOR BLOOD PRESSURE SUPPORT)
PREFILLED_SYRINGE | INTRAVENOUS | Status: AC
  Filled 2018-12-27: qty 10

## 2018-12-27 MED ORDER — ALBUMIN HUMAN 5 % IV SOLN
INTRAVENOUS | Status: DC | PRN
  Administered 2018-12-27: 09:00:00 via INTRAVENOUS

## 2018-12-27 MED ORDER — ONDANSETRON HCL 4 MG PO TABS: 4.0000 mg | ORAL_TABLET | Freq: Three times a day (TID) | ORAL | 0 refills | Status: DC | PRN

## 2018-12-27 MED ORDER — TIZANIDINE HCL 4 MG PO TABS: 4.0000 mg | ORAL_TABLET | Freq: Three times a day (TID) | ORAL | 0 refills | Status: AC | PRN

## 2018-12-27 MED ORDER — GABAPENTIN 300 MG PO CAPS
300.0000 mg | ORAL_CAPSULE | Freq: Three times a day (TID) | ORAL | Status: DC
  Administered 2018-12-27 – 2018-12-28 (×3): 300 mg via ORAL
  Filled 2018-12-27 (×3): qty 1

## 2018-12-27 MED ORDER — HYDROMORPHONE HCL 1 MG/ML IJ SOLN
0.2500 mg | INTRAMUSCULAR | Status: DC | PRN
  Administered 2018-12-27: 0.25 mg via INTRAVENOUS

## 2018-12-27 MED ORDER — ACETAMINOPHEN 325 MG PO TABS
650.0000 mg | ORAL_TABLET | ORAL | Status: DC | PRN
  Administered 2018-12-27 – 2018-12-28 (×2): 650 mg via ORAL
  Filled 2018-12-27 (×2): qty 2

## 2018-12-27 MED ORDER — DEXAMETHASONE SODIUM PHOSPHATE 10 MG/ML IJ SOLN
INTRAMUSCULAR | Status: DC | PRN
  Administered 2018-12-27: 10 mg via INTRAVENOUS

## 2018-12-27 MED ORDER — LACTATED RINGERS IV SOLN: INTRAVENOUS | Status: DC

## 2018-12-27 MED ORDER — OXYCODONE-ACETAMINOPHEN 10-325 MG PO TABS: 1.0000 | ORAL_TABLET | Freq: Four times a day (QID) | ORAL | 0 refills | Status: AC | PRN

## 2018-12-27 MED ORDER — DEXAMETHASONE SODIUM PHOSPHATE 10 MG/ML IJ SOLN
INTRAMUSCULAR | Status: AC
  Filled 2018-12-27: qty 1

## 2018-12-27 MED ORDER — SODIUM CHLORIDE 0.9% FLUSH: 3.0000 mL | INTRAVENOUS | Status: DC | PRN

## 2018-12-27 MED ORDER — ACETAMINOPHEN 10 MG/ML IV SOLN
INTRAVENOUS | Status: AC
  Filled 2018-12-27: qty 100

## 2018-12-27 MED ORDER — SUCCINYLCHOLINE CHLORIDE 200 MG/10ML IV SOSY
PREFILLED_SYRINGE | INTRAVENOUS | Status: AC
  Filled 2018-12-27: qty 10

## 2018-12-27 MED ORDER — PHENYLEPHRINE HCL (PRESSORS) 10 MG/ML IV SOLN
INTRAVENOUS | Status: DC | PRN
  Administered 2018-12-27 (×2): 80 ug via INTRAVENOUS

## 2018-12-27 MED ORDER — ONDANSETRON HCL 4 MG/2ML IJ SOLN
INTRAMUSCULAR | Status: DC | PRN
  Administered 2018-12-27: 4 mg via INTRAVENOUS

## 2018-12-27 MED ORDER — METHOCARBAMOL 1000 MG/10ML IJ SOLN
500.0000 mg | Freq: Four times a day (QID) | INTRAVENOUS | Status: DC | PRN
  Filled 2018-12-27: qty 5

## 2018-12-27 MED ORDER — THROMBIN 20000 UNITS EX SOLR
CUTANEOUS | Status: DC | PRN
  Administered 2018-12-27: 20 mL

## 2018-12-27 MED ORDER — SUCCINYLCHOLINE CHLORIDE 20 MG/ML IJ SOLN
INTRAMUSCULAR | Status: DC | PRN
  Administered 2018-12-27: 120 mg via INTRAVENOUS

## 2018-12-27 MED ORDER — PROPOFOL 500 MG/50ML IV EMUL
INTRAVENOUS | Status: DC | PRN
  Administered 2018-12-27: 50 ug/kg/min via INTRAVENOUS

## 2018-12-27 MED ORDER — ONDANSETRON HCL 4 MG PO TABS
4.0000 mg | ORAL_TABLET | Freq: Four times a day (QID) | ORAL | Status: DC | PRN
  Administered 2018-12-28: 4 mg via ORAL
  Filled 2018-12-27: qty 1

## 2018-12-27 MED ORDER — LIDOCAINE 2% (20 MG/ML) 5 ML SYRINGE
INTRAMUSCULAR | Status: AC
  Filled 2018-12-27: qty 5

## 2018-12-27 MED ORDER — PROMETHAZINE HCL 25 MG/ML IJ SOLN: 6.2500 mg | INTRAMUSCULAR | Status: DC | PRN

## 2018-12-27 MED ORDER — BUPIVACAINE-EPINEPHRINE 0.25% -1:200000 IJ SOLN
INTRAMUSCULAR | Status: DC | PRN
  Administered 2018-12-27: 20 mL

## 2018-12-27 MED ORDER — ONDANSETRON HCL 4 MG/2ML IJ SOLN
INTRAMUSCULAR | Status: AC
  Filled 2018-12-27: qty 2

## 2018-12-27 MED ORDER — EPHEDRINE 5 MG/ML INJ
INTRAVENOUS | Status: AC
  Filled 2018-12-27: qty 10

## 2018-12-27 MED ORDER — PROPOFOL 10 MG/ML IV BOLUS
INTRAVENOUS | Status: AC
  Filled 2018-12-27: qty 40

## 2018-12-27 MED ORDER — BUPIVACAINE-EPINEPHRINE (PF) 0.25% -1:200000 IJ SOLN
INTRAMUSCULAR | Status: AC
  Filled 2018-12-27: qty 30

## 2018-12-27 MED ORDER — ACETAMINOPHEN 650 MG RE SUPP: 650.0000 mg | RECTAL | Status: DC | PRN

## 2018-12-27 MED ORDER — OXYCODONE HCL 5 MG PO TABS
10.0000 mg | ORAL_TABLET | ORAL | Status: DC | PRN
  Administered 2018-12-27 – 2018-12-28 (×4): 10 mg via ORAL
  Filled 2018-12-27 (×4): qty 2

## 2018-12-27 SURGICAL SUPPLY — 69 items
AGENT HMST KT MTR STRL THRMB (HEMOSTASIS) ×2
BONE VIVIGEN FORMABLE 5.4CC (Bone Implant) ×2 IMPLANT
BUR EGG ELITE 4.0 (BURR) ×2 IMPLANT
CABLE BIPOLOR RESECTION CORD (MISCELLANEOUS) ×2 IMPLANT
CLSR STERI-STRIP ANTIMIC 1/2X4 (GAUZE/BANDAGES/DRESSINGS) ×2 IMPLANT
COVER MAYO STAND STRL (DRAPES) IMPLANT
COVER SURGICAL LIGHT HANDLE (MISCELLANEOUS) ×2 IMPLANT
COVER WAND RF STERILE (DRAPES) ×2 IMPLANT
DECANTER SPIKE VIAL GLASS SM (MISCELLANEOUS) ×1 IMPLANT
DRAPE C-ARM 42X72 X-RAY (DRAPES) ×2 IMPLANT
DRAPE C-ARMOR (DRAPES) ×2 IMPLANT
DRAPE POUCH INSTRU U-SHP 10X18 (DRAPES) ×2 IMPLANT
DRAPE SURG 17X23 STRL (DRAPES) ×2 IMPLANT
DRAPE U-SHAPE 47X51 STRL (DRAPES) ×2 IMPLANT
DRSG OPSITE POSTOP 4X6 (GAUZE/BANDAGES/DRESSINGS) ×2 IMPLANT
DRSG OPSITE POSTOP 4X8 (GAUZE/BANDAGES/DRESSINGS) ×2 IMPLANT
DURAPREP 26ML APPLICATOR (WOUND CARE) ×2 IMPLANT
ELECT BLADE 4.0 EZ CLEAN MEGAD (MISCELLANEOUS)
ELECT BLADE 6.5 EXT (BLADE) ×2 IMPLANT
ELECT CAUTERY BLADE 6.4 (BLADE) ×2 IMPLANT
ELECT PENCIL ROCKER SW 15FT (MISCELLANEOUS) ×2 IMPLANT
ELECT REM PT RETURN 9FT ADLT (ELECTROSURGICAL) ×2
ELECTRODE BLDE 4.0 EZ CLN MEGD (MISCELLANEOUS) IMPLANT
ELECTRODE REM PT RTRN 9FT ADLT (ELECTROSURGICAL) ×1 IMPLANT
FEE INTRAOP MONITOR IMPULS NCS (MISCELLANEOUS) IMPLANT
GLOVE BIOGEL PI IND STRL 8.5 (GLOVE) ×1 IMPLANT
GLOVE BIOGEL PI INDICATOR 8.5 (GLOVE) ×1
GLOVE SS BIOGEL STRL SZ 8.5 (GLOVE) ×1 IMPLANT
GLOVE SUPERSENSE BIOGEL SZ 8.5 (GLOVE) ×1
GLOVE SURG SS PI 7.0 STRL IVOR (GLOVE) ×1 IMPLANT
GOWN STRL REUS W/ TWL LRG LVL3 (GOWN DISPOSABLE) ×2 IMPLANT
GOWN STRL REUS W/TWL 2XL LVL3 (GOWN DISPOSABLE) ×4 IMPLANT
GOWN STRL REUS W/TWL LRG LVL3 (GOWN DISPOSABLE) ×4
INTRAOP MONITOR FEE IMPULS NCS (MISCELLANEOUS) ×1
INTRAOP MONITOR FEE IMPULSE (MISCELLANEOUS) ×1
KIT BASIN OR (CUSTOM PROCEDURE TRAY) ×2 IMPLANT
KIT POSITION SURG JACKSON T1 (MISCELLANEOUS) ×2 IMPLANT
KIT TURNOVER KIT B (KITS) ×2 IMPLANT
NEEDLE 22X1 1/2 (OR ONLY) (NEEDLE) ×2 IMPLANT
NEEDLE SPNL 18GX3.5 QUINCKE PK (NEEDLE) ×4 IMPLANT
NS IRRIG 1000ML POUR BTL (IV SOLUTION) ×2 IMPLANT
PACK LAMINECTOMY ORTHO (CUSTOM PROCEDURE TRAY) ×2 IMPLANT
PACK UNIVERSAL I (CUSTOM PROCEDURE TRAY) ×2 IMPLANT
PAD ARMBOARD 7.5X6 YLW CONV (MISCELLANEOUS) ×4 IMPLANT
PATTIES SURGICAL .5 X.5 (GAUZE/BANDAGES/DRESSINGS) ×1 IMPLANT
PATTIES SURGICAL .5 X1 (DISPOSABLE) ×2 IMPLANT
POSITIONER HEAD PRONE TRACH (MISCELLANEOUS) ×2 IMPLANT
PROBE PEDCLE PROBE MAGSTM DISP (MISCELLANEOUS) ×1 IMPLANT
ROD EXPEDIUM PER BENT 65MM (Rod) ×2 IMPLANT
SCREW CORT FIX FEN 5.5X6X35MM (Screw) ×4 IMPLANT
SCREW CORT FIX FEN 5.5X6X40MM (Screw) ×4 IMPLANT
SCREW SET SINGLE INNER (Screw) ×5 IMPLANT
SPONGE LAP 4X18 RFD (DISPOSABLE) ×4 IMPLANT
SPONGE SURGIFOAM ABS GEL 100 (HEMOSTASIS) ×2 IMPLANT
SURGIFLO W/THROMBIN 8M KIT (HEMOSTASIS) ×2 IMPLANT
SUT BONE WAX W31G (SUTURE) ×2 IMPLANT
SUT MNCRL AB 3-0 PS2 18 (SUTURE) ×4 IMPLANT
SUT VIC AB 1 CT1 18XCR BRD 8 (SUTURE) ×1 IMPLANT
SUT VIC AB 1 CT1 8-18 (SUTURE) ×2
SUT VIC AB 1 CTX 36 (SUTURE)
SUT VIC AB 1 CTX36XBRD ANBCTR (SUTURE) IMPLANT
SUT VIC AB 2-0 CT1 18 (SUTURE) ×2 IMPLANT
SYR BULB IRRIGATION 50ML (SYRINGE) ×2 IMPLANT
SYR CONTROL 10ML LL (SYRINGE) ×2 IMPLANT
TOWEL GREEN STERILE (TOWEL DISPOSABLE) ×2 IMPLANT
TOWEL GREEN STERILE FF (TOWEL DISPOSABLE) ×2 IMPLANT
TRAY FOLEY MTR SLVR 16FR STAT (SET/KITS/TRAYS/PACK) ×2 IMPLANT
WATER STERILE IRR 1000ML POUR (IV SOLUTION) ×2 IMPLANT
YANKAUER SUCT BULB TIP NO VENT (SUCTIONS) ×2 IMPLANT

## 2018-12-27 NOTE — Evaluation (Signed)
Physical Therapy Evaluation Patient Details Name: IVON ROEDEL MRN: [id number] DOB: 11-22-1945 Today's Date: 12/27/2018   History of Present Illness  CAREN GARSKE is a 73 y.o. female admitted on 12/27/2018 s/p Posterior spinal fusion interbody L4-S1, posterior decompression L4-5 (N/A) as a result of left neuropathic leg pain.  PMHx:  osteoporosis, spondylolisthesis  Clinical Impression  Pt admitted with/for lumbar fusion surgery.  Pt needing min guard to supervision for all basic mobility and gait.Marland Kitchen  Pt currently limited functionally due to the problems listed below.  (see problems list.)  Pt will benefit from PT to maximize function and safety to be able to get home safely with available assist.     Follow Up Recommendations No PT follow up    Equipment Recommendations  None recommended by PT    Recommendations for Other Services       Precautions / Restrictions Precautions Precautions: Back Required Braces or Orthoses: Spinal Brace Spinal Brace: Lumbar corset;Applied in sitting position Restrictions Weight Bearing Restrictions: No      Mobility  Bed Mobility Overal bed mobility: Needs Assistance Bed Mobility: Rolling;Sidelying to Sit;Sit to Sidelying Rolling: Min guard Sidelying to sit: Min guard     Sit to sidelying: Min guard General bed mobility comments: after practice, could accomplish without rail or assist.  Transfers Overall transfer level: Needs assistance   Transfers: Sit to/from Stand Sit to Stand: Min assist         General transfer comment: cues for hand placement.    Ambulation/Gait Ambulation/Gait assistance: Min guard Gait Distance (Feet): 180 Feet Assistive device: IV Pole;None Gait Pattern/deviations: Step-through pattern   Gait velocity interpretation: 1.31 - 2.62 ft/sec, indicative of limited community ambulator General Gait Details: wide BOS, mildly unsteady, but at times able to safey ambulate without   Stairs             Wheelchair Mobility    Modified Rankin (Stroke Patients Only)       Balance Overall balance assessment: No apparent balance deficits (not formally assessed)                                           Pertinent Vitals/Pain Pain Assessment: Faces Faces Pain Scale: Hurts even more Pain Location: back incision Pain Descriptors / Indicators: Discomfort;Aching;Burning;Sore Pain Intervention(s): Monitored during session;Limited activity within patient's tolerance    Home Living Family/patient expects to be discharged to:: Private residence Living Arrangements: Spouse/significant other Available Help at Discharge: Family;Available 24 hours/day Type of Home: House Home Access: Stairs to enter     Home Layout: One level;Two level;Able to live on main level with bedroom/bathroom Home Equipment: Gilford Rile - 2 wheels;Cane - single point;Toilet riser(rise with armrest)      Prior Function Level of Independence: Independent;Independent with assistive device(s)               Hand Dominance        Extremity/Trunk Assessment                Communication   Communication: No difficulties  Cognition Arousal/Alertness: Awake/alert Behavior During Therapy: WFL for tasks assessed/performed Overall Cognitive Status: Within Functional Limits for tasks assessed  General Comments General comments (skin integrity, edema, etc.): pt instructed in back care/prec, log roll/transition side to/from sit, bracing issues, lifting restrictions, progression of activity.    Exercises     Assessment/Plan    PT Assessment Patient needs continued PT services  PT Problem List Decreased strength;Decreased activity tolerance;Decreased mobility;Decreased knowledge of use of DME;Decreased knowledge of precautions;Pain       PT Treatment Interventions DME instruction;Gait training;Stair training;Functional mobility  training;Patient/family education    PT Goals (Current goals can be found in the Care Plan section)  Acute Rehab PT Goals Patient Stated Goal: Independent PT Goal Formulation: With patient Time For Goal Achievement: 01/03/19 Potential to Achieve Goals: Good    Frequency Min 5X/week   Barriers to discharge        Co-evaluation               AM-PAC PT "6 Clicks" Mobility  Outcome Measure Help needed turning from your back to your side while in a flat bed without using bedrails?: A Little Help needed moving from lying on your back to sitting on the side of a flat bed without using bedrails?: A Little Help needed moving to and from a bed to a chair (including a wheelchair)?: A Little Help needed standing up from a chair using your arms (e.g., wheelchair or bedside chair)?: A Little Help needed to walk in hospital room?: A Little Help needed climbing 3-5 steps with a railing? : A Little 6 Click Score: 18    End of Session   Activity Tolerance: Patient tolerated treatment well Patient left: in bed;with call bell/phone within reach Nurse Communication: Mobility status PT Visit Diagnosis: Other abnormalities of gait and mobility (R26.89);Pain Pain - part of body: (back incision)    Time: 7017-7939 PT Time Calculation (min) (ACUTE ONLY): 40 min   Charges:   PT Evaluation $PT Eval Low Complexity: 1 Low PT Treatments $Gait Training: 8-22 mins $Self Care/Home Management: 8-22        12/27/2018  Donnella Sham, PT Acute Rehabilitation Services [phone]  (pager) [phone]  (office)  Tessie Fass Matilde Pottenger 12/27/2018, 6:10 PM

## 2018-12-27 NOTE — Brief Op Note (Signed)
12/27/2018  12:27 PM  PATIENT:  Maria Jackson  73 y.o. female  PRE-OPERATIVE DIAGNOSIS:  Recurrent spinal stenosis L4-5,  degenerative disc disease L4-5  POST-OPERATIVE DIAGNOSIS:  Recurrent spinal stenosis L4-5,  degenerative disc disease L4-5  PROCEDURE:  Procedure(s) with comments: Posterior spinal fusion interbody L4-S1, posterior decompression L4-5 (N/A) - 3.5 hrs  SURGEON:  Surgeon(s) and Role:    Melina Schools, MD - Primary  PHYSICIAN ASSISTANT:   ASSISTANTS: Amanda Ward, PA   ANESTHESIA:   general  EBL:  550 mL   BLOOD ADMINISTERED:none  DRAINS: none   LOCAL MEDICATIONS USED:  MARCAINE     SPECIMEN:  No Specimen  DISPOSITION OF SPECIMEN:  N/A  COUNTS:  YES  TOURNIQUET:  * No tourniquets in log *  DICTATION: .Dragon Dictation  PLAN OF CARE: Admit to inpatient   PATIENT DISPOSITION:  PACU - hemodynamically stable.

## 2018-12-27 NOTE — Anesthesia Procedure Notes (Signed)
Procedure Name: Intubation Date/Time: 12/27/2018 7:47 AM Performed by: Scheryl Darter, CRNA Pre-anesthesia Checklist: Patient identified, Emergency Drugs available, Suction available and Patient being monitored Patient Re-evaluated:Patient Re-evaluated prior to induction Oxygen Delivery Method: Circle System Utilized Preoxygenation: Pre-oxygenation with 100% oxygen Induction Type: IV induction Ventilation: Mask ventilation without difficulty Laryngoscope Size: Miller and 2 Grade View: Grade I Tube type: Oral Tube size: 7.0 mm Number of attempts: 1 Airway Equipment and Method: Stylet and Oral airway Placement Confirmation: ETT inserted through vocal cords under direct vision,  positive ETCO2 and breath sounds checked- equal and bilateral Secured at: 21 cm Tube secured with: Tape Dental Injury: Teeth and Oropharynx as per pre-operative assessment

## 2018-12-27 NOTE — Anesthesia Preprocedure Evaluation (Signed)
Anesthesia Evaluation  Patient identified by MRN, date of birth, ID band Patient awake    Reviewed: Allergy & Precautions, NPO status , Patient's Chart, lab work & pertinent test results  Airway Mallampati: II  TM Distance: >3 FB Neck ROM: Full    Dental no notable dental hx.    Pulmonary neg pulmonary ROS,    Pulmonary exam normal breath sounds clear to auscultation       Cardiovascular negative cardio ROS Normal cardiovascular exam Rhythm:Regular Rate:Normal     Neuro/Psych negative neurological ROS  negative psych ROS   GI/Hepatic Neg liver ROS, GERD  ,  Endo/Other  negative endocrine ROS  Renal/GU negative Renal ROS  negative genitourinary   Musculoskeletal negative musculoskeletal ROS (+)   Abdominal   Peds negative pediatric ROS (+)  Hematology negative hematology ROS (+)   Anesthesia Other Findings   Reproductive/Obstetrics negative OB ROS                             Anesthesia Physical Anesthesia Plan  ASA: II  Anesthesia Plan: General   Post-op Pain Management:    Induction: Intravenous  PONV Risk Score and Plan: 3 and Ondansetron, Dexamethasone and Treatment may vary due to age or medical condition  Airway Management Planned: Oral ETT  Additional Equipment:   Intra-op Plan:   Post-operative Plan: Extubation in OR  Informed Consent: I have reviewed the patients History and Physical, chart, labs and discussed the procedure including the risks, benefits and alternatives for the proposed anesthesia with the patient or authorized representative who has indicated his/her understanding and acceptance.     Dental advisory given  Plan Discussed with: CRNA and Surgeon  Anesthesia Plan Comments:         Anesthesia Quick Evaluation  

## 2018-12-27 NOTE — Progress Notes (Signed)
Pharmacy Antibiotic Note  Maria Jackson is a 73 y.o. female admitted on 12/27/2018 s/p Posterior spinal fusion interbody L4-S1, posterior decompression L4-5 (N/A) .  Pharmacy has been consulted for vancomycin dosing for surgical prophylaxis. No drains noted.   Plan: Vancomycin 1 gm IV x 1 12 hours after preop dose Pharmacy will sign off  Height: 5\' 2"  (157.5 cm) Weight: 159 lb 13.3 oz (72.5 kg) IBW/kg (Calculated) : 50.1  Temp (24hrs), Avg:97.7 F (36.5 C), Min:97.2 F (36.2 C), Max:98.3 F (36.8 C)  Recent Labs  Lab 12/22/18 1359  WBC 5.4  CREATININE 0.67    Estimated Creatinine Clearance: 56.7 mL/min (by C-G formula based on SCr of 0.67 mg/dL).    Allergies  Allergen Reactions  . Bee Venom Itching, Swelling and Other (See Comments)    REDNESS   . Ibuprofen Rash and Other (See Comments)    Mouth ulcers  . Cephalexin     UNSPECIFIED REACTION   . Penicillins     Childhood allergy Did it involve swelling of the face/tongue/throat, SOB, or low BP? Unknown Did it involve sudden or severe rash/hives, skin peeling, or any reaction on the inside of your mouth or nose? Unknown Did you need to seek medical attention at a hospital or doctor's office? Unknown When did it last happen?childhood allergy If all above answers are "NO", may proceed with cephalosporin use.   . Sulfonamide Derivatives Rash    Verba Ainley A. Levada Dy, PharmD, [place] Please utilize Amion for appropriate phone number to reach the unit pharmacist ([place])   12/27/2018 3:36 PM

## 2018-12-27 NOTE — Op Note (Signed)
Operative note  Preoperative diagnosis:Degenerative lumbar disc disease L5-S1.  Status post anterior lumbar interbody fusion L4-5 with subsidence of the cage and recurrent stenosis.  Postoperative diagnosis: Same  Operative procedure: 1.  Left Gill decompression L4-5 with complete section of the pars and facet and foraminotomy.    2.  Posterior lateral arthrodesis L4-S1 utilizing allograft bone and local autograft.    3.  Posterior pedicle screw fixation/fusion O2-U2   Complications: Right L5 pedicle had a lateral breach and so no screw was placed at this level.  No other complications were noted.  First assistant: Cleta Alberts, PA  Implants: Depey  Cortical Fixation Screws/Viper.  Left: L4 and L5:  6.0 x 40 mm length screw.  S1: 6.0 x 35 mm              Right: L4: 6.0 x 40 mm length screw.  S1: 6.0 x 35 mm screw.  Allograft: vivogen  Intraoperative neuro monitoring: No adverse EMG/SSEP during.  After left Gill decompression there was improvement in the L4 and L5 signals (amplitude).  Individual screws were stimulated directly and there was no adverse EMG activity below 28 mA.  Indications: Keilynn is a very pleasant 73 year old man who approximately a year ago underwent an L4-L5 instrumented fusion anteriorly for degenerative spondylolisthesis with back and neuropathic leg pain.  The patient initially did well but then began having recurrent leg pain.  Subsequent x-rays demonstrated that there was subsidence of the cage into the vertebral body of L5.  As a result the foraminal and lateral recess stenosis recurred and she began having increasing radicular L4 and L5 pain.  Attempts at conservative management had tried and failed to improve her overall quality of life.  As result we elected to move forward with surgery.  Imaging studies demonstrated advanced degenerative disc disease at L5-S1 as well as recurrent spinal stenosis at L4-5 with collapse of the intervertebral cage.  As result of the  progressive low back pain and neuropathic left leg pain the decision was made to take her back to the operating room to do a revision Gill decompression on the left side at L4-5 and then perform an instrumented fusion L4-S1.  This would address the degenerative disc disease at L5-S1 and decrease the overall pain coming from that level as well as to decompress and stabilize the L4-5 level.  All appropriate risks benefits and alternatives were discussed with the patient and consent was obtained.  Operative report: Patient was brought the operating room placed upon the operating room table.  After successful induction of general anesthesia and endotracheal ovation teds SCDs and a Foley were inserted.  Patient was turned prone onto the Pajak frame and all bony prominences well-padded.  The neuro monitoring representative placed all appropriate needles for monitoring the EMG and SSEP activity.  The back was then prepped and draped in a standard fashion.  Timeout was taken to confirm patient procedure and all other important data.  X-ray was used to identify the L5 for now S1 levels and I marked out my incision site.  The incision site was infiltrated with quarter percent Marcaine with epinephrine.  Midline incision was made and sharp dissection was carried out down to the deep tissue to the deep fascia.  Fascia was sharply incised and I stripped the paraspinal muscles to expose the L4-L5 and S1 lamina and facet complex.  I then stripped posterior lateral exposing the L4-L5 transverse process and sacral ala.  The facet capsule at L5-S1 and  L4-5 were removed to facilitate the fusion.  Care was taken not to disrupt the L3-4 facet complex while exposing the L4 transverse process.  Once I had the posterior exposure complete I then confirmed the L4-5 level with intraoperative fluoroscopy.  Once confirmed I treated with my decompression.  I noted that there was very little movement at the L4-5 interspinous process level.   There is no evidence to suggest a pseudoarthrosis anteriorly.  I remove the posterior spinous process of L4 and then using an osteotome to remove the inferior L4 facet.  At this point using a lamina spreader I was able to gently distract the L4-5 interlaminar space just enough to get a 2 mm Kerrison punch underneath the lamina of L4.  I then used this 2 mm Kerrison Roger to perform a generous hemilaminotomy on the left side of L4.  I then used my Penfield 4 to gently dissect through the ligamentum flavum and then I used my 2 mm Kerrison rondure preserved to remove the ligamentum flavum and exposed the posterior aspect of the thecal sac.  I worked my way contralaterally to the right-hand side as well.  I had a central and left lateral decompression.  I then continue to help my plane between the thecal sac and the ligamentum flavum was on worked my way into the lateral recess.  I was eventually able to palpate the medial wall of the L5 pedicle and sweep the L5 nerve root medially and then continue my Gill decompression.  There was very large epidural veins which were isolated and coagulated with bipolar cautery.  I continue resecting the ligamentum flavum until I had remove the entire pars until the entire L4 nerve root was visualized and adequately decompressed.  At this point I could easily pass my nerve hook inferiorly along the path of the L5 nerve root superiorly and along the L4 nerve root and in the lateral recess.  There was adequate complete decompression.  At this point SSEPs were noted to have actually improved.  Amplitude of the L4 and L5 signals were improved after this decompression.  At this point with improvement in the signals and direct visualization of the decompression I felt as though this was adequate.  I now turned my attention to placing the pedicle screws.  Identified the midportion of the transverse process and traced it back to its origin near the facet I use this as my starting position  and I advanced the pedicle probe through the left L4 pedicle and into the L4 vertebral body.  I remove the pedicle probe and then sounded the canal with a ball-tipped feeler confirming I had a solid bony canal.  I then placed my tap and then re-sounded the canal with a ball-tipped feeler.  With a solid bony canal I then obtained the 40 mm length screw and placed it at L4.  I then directly stimulated the screw and there was no activity at 30 mA.  I repeated this exact same procedure and technique at L5 and at S1.  Once both pedicle screws were placed I again stimulated them and at L5 there was no activity at 27 mA and at S1 there was no activity at 28 mA.  This point I was pleased with the fixation.  All screws had excellent purchase.  I then went to the right-hand side and again using the same technique I placed pedicle screws at L4 and S1.  I attempted to place the L5 screw but as I  was tapping I felt as though the lateral pedicle wall was compromised and in fact when I palpated the hole it did not feel like it was a viable screw hole.  With excellent fixation on the left-hand side and excellent fixation at L4 and S1 I did not feel as though it is imperative to place this pedicle screw.  After the L4 and S1 screws were placed I again directly stimulated and there was no activity at L4 at 28 mA and no activity at S1 at 31 mA.  All 5 screws stimulated and properly positioned x-rays confirmed satisfactory trajectory and position in both the AP and lateral planes.  At this point I decorticated the remaining portion of transverse process and the sacral ala and placed bone graft in the posterior lateral gutter from L4-S1.  Care was taken not to have the bone graft rest on the exposed left L4 nerve root.  With the posterior lateral graft in place I then measured and placed rods on both sides and secured them according manufacture standards.  All top locking knots were torqued off according manufacturer standards after  all the hardware is been complete and inserted I took final AP and lateral images which were satisfactory.  At this point I copiously irrigated the wound with normal saline and then confirmed that I still had adequate decompression L4-5.  I then placed a thrombin-soaked Gelfoam patty over the Gill decompression site and once I confirmed generalized hemostasis using bipolar electrocautery I closed the deep fascia with interrupted #1 Vicryl sutures then a layer of 0 Vicryl running, and then 2-0 Vicryl suture and a 3-0 Monocryl.  Steri-Strips and dry dressing were applied and the patient was ultimately extubated and transferred the PACU without incident.  The end of the case all needle sponge counts were correct there were no adverse intraoperative events.

## 2018-12-27 NOTE — Transfer of Care (Signed)
Immediate Anesthesia Transfer of Care Note  Patient: Maria Jackson  Procedure(s) Performed: Posterior spinal fusion interbody L4-S1, posterior decompression L4-5 (N/A Back)  Patient Location: PACU  Anesthesia Type:General  Level of Consciousness: awake, alert  and oriented  Airway & Oxygen Therapy: Patient Spontanous Breathing and Patient connected to nasal cannula oxygen  Post-op Assessment: Report given to RN and Post -op Vital signs reviewed and stable  Post vital signs: Reviewed and stable  Last Vitals:  Vitals Value Taken Time  BP    Temp    Pulse 80 12/27/18 1313  Resp 14 12/27/18 1313  SpO2 100 % 12/27/18 1313  Vitals shown include unvalidated device data.  Last Pain:  Vitals:   12/27/18 0603  PainSc: 0-No pain         Complications: No apparent anesthesia complications

## 2018-12-27 NOTE — Discharge Instructions (Signed)

## 2018-12-27 NOTE — H&P (Signed)
Addendum H&P by Dr. Rolena Infante  Patient continues to have left neuropathic leg pain as well as increasing low back pain.  She had a prior anterior lumbar interbody fusion of L4-5 and unfortunately had some subsidence of the cage.  She also has advanced degenerative disc disease at L5-S1.  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  Surgical plan is a revision lumbar decompression at L4-5 and then posterior stabilization with pedicle screw construct and posterior lateral arthrodesis L4-S1.  There is been no change in her clinical exam since her last office visit of 12/15/2018.  I discussed the surgery with her this morning and she is expressed understanding of the surgical procedure as well as the risks and benefits.  All of her questions were encouraged and addressed.

## 2018-12-28 MED FILL — Thrombin (Recombinant) For Soln 20000 Unit: CUTANEOUS | Qty: 1 | Status: AC

## 2018-12-28 NOTE — Progress Notes (Signed)
Physical Therapy Treatment Patient Details Name: Maria Jackson MRN: [id number] DOB: [birth year] Today's Date: 12/28/2018    History of Present Illness Maria Jackson is a 73 y.o. female admitted on 12/27/2018 s/p Posterior spinal fusion interbody L4-S1, posterior decompression L4-5 (N/A) as a result of left neuropathic leg pain.  PMHx:  osteoporosis, spondylolisthesis    PT Comments    Pt progressing towards physical therapy goals. Was able to perform transfers and ambulation with gross min guard assist for balance support and safety. Pt with 1 instance of nausea when finishing up with OT, however VSS and pt reports the episode passed. Pt was instructed in precautions, brace wearing schedule, car transfer, and use of SPC for safety. Pt anticipates d/c home today.   Follow Up Recommendations  No PT follow up     Equipment Recommendations  None recommended by PT    Recommendations for Other Services       Precautions / Restrictions Precautions Precautions: Back Precaution Booklet Issued: Yes (comment) Precaution Comments: issued and reviewed with pt Required Braces or Orthoses: Spinal Brace Spinal Brace: Lumbar corset;Applied in sitting position Restrictions Weight Bearing Restrictions: No    Mobility  Bed Mobility Overal bed mobility: Needs Assistance Bed Mobility: Sidelying to Sit   Sidelying to sit: Supervision       General bed mobility comments: Pt was received in room with OT finishing up grooming activity at the sink. We discussed log roll technique at end of session.   Transfers Overall transfer level: Needs assistance Equipment used: Straight cane Transfers: Sit to/from Stand Sit to Stand: Min guard         General transfer comment: Pt appears steady with SPC use however appears guarded and reaching for external support without it.   Ambulation/Gait Ambulation/Gait assistance: Min guard Gait Distance (Feet): 250 Feet Assistive device: Straight  cane Gait Pattern/deviations: Step-through pattern Gait velocity: Decreased Gait velocity interpretation: 1.31 - 2.62 ft/sec, indicative of limited community ambulator General Gait Details: Grossly steady with SPC and min guard assist for balance support and safety.    Stairs Stairs: Yes Stairs assistance: Min guard Stair Management: One rail Left;With cane;Step to pattern;Forwards Number of Stairs: 4 General stair comments: VC's for sequencing and general safety.    Wheelchair Mobility    Modified Rankin (Stroke Patients Only)       Balance Overall balance assessment: Needs assistance Sitting-balance support: Feet supported Sitting balance-Leahy Scale: Good     Standing balance support: Single extremity supported;No upper extremity supported;During functional activity Standing balance-Leahy Scale: Fair Standing balance comment: pt able to maintain static balance with minguard for safety; pt noted often seeking single UE support when ambulating throughout room without AD                            Cognition Arousal/Alertness: Awake/alert Behavior During Therapy: WFL for tasks assessed/performed Overall Cognitive Status: Within Functional Limits for tasks assessed                                        Exercises      General Comments General comments (skin integrity, edema, etc.): pt with one episode of nausea while standing in bathroom requiring seated rest break, BP taken and stable, with seated rest pt reports nausea subsided and feeling okay to continue with therapy session; RN also notified of pt  request for nausea meds      Pertinent Vitals/Pain Pain Assessment: Faces Faces Pain Scale: Hurts little more Pain Location: back incision Pain Descriptors / Indicators: Discomfort;Aching;Burning;Sore Pain Intervention(s): Monitored during session    Home Living Family/patient expects to be discharged to:: Private residence Living  Arrangements: Spouse/significant other Available Help at Discharge: Family;Available 24 hours/day Type of Home: House Home Access: Stairs to enter   Home Layout: One level;Two level;Able to live on main level with bedroom/bathroom Home Equipment: Gilford Rile - 2 wheels;Cane - single point;Toilet riser;Shower seat(rise with armrest)      Prior Function Level of Independence: Independent with assistive device(s);Needs assistance  Gait / Transfers Assistance Needed: intermittent use of SPC ADL's / Homemaking Assistance Needed: pt reports spouse assists with ADL tasks including providing supervision for showers     PT Goals (current goals can now be found in the care plan section) Acute Rehab PT Goals Patient Stated Goal: Independent PT Goal Formulation: With patient Time For Goal Achievement: 01/03/19 Potential to Achieve Goals: Good Progress towards PT goals: Progressing toward goals    Frequency    Min 5X/week      PT Plan Current plan remains appropriate    Co-evaluation              AM-PAC PT "6 Clicks" Mobility   Outcome Measure  Help needed turning from your back to your side while in a flat bed without using bedrails?: A Little Help needed moving from lying on your back to sitting on the side of a flat bed without using bedrails?: A Little Help needed moving to and from a bed to a chair (including a wheelchair)?: A Little Help needed standing up from a chair using your arms (e.g., wheelchair or bedside chair)?: A Little Help needed to walk in hospital room?: A Little Help needed climbing 3-5 steps with a railing? : A Little 6 Click Score: 18    End of Session Equipment Utilized During Treatment: Gait belt;Back brace Activity Tolerance: Patient tolerated treatment well Patient left: in chair;with call bell/phone within reach Nurse Communication: Mobility status PT Visit Diagnosis: Other abnormalities of gait and mobility (R26.89);Pain Pain - part of body: (back  incision)     Time: 0370-4888 PT Time Calculation (min) (ACUTE ONLY): 28 min  Charges:  $Gait Training: 23-37 mins                     Rolinda Roan, PT, DPT Acute Rehabilitation Services Pager: [phone] Office: [phone]    Thelma Comp 12/28/2018, 10:50 AM

## 2018-12-28 NOTE — Evaluation (Signed)
Occupational Therapy Evaluation Patient Details Name: Maria Jackson MRN: [id number] DOB: [birth year] Today's Date: 12/28/2018    History of Present Illness Maria Jackson is a 73 y.o. female admitted on 12/27/2018 s/p Posterior spinal fusion interbody L4-S1, posterior decompression L4-5 (N/A) as a result of left neuropathic leg pain.  PMHx:  osteoporosis, spondylolisthesis   Clinical Impression   This 73 y/o female presents with the above. PTA pt reports intermittent use of SPC for functional mobility, reports spouse assists with ADL/iADL PRN. Pt performing functional mobility within room without use of AD and close minguard assist today; pt mildly unsteady and noted intermittently seeking single UE support with mobility, spoke with pt about possible use of SPC for increased stability. She currently requires setup/minguard assist for UB ADL, modA for LB ADL secondary to need to adhere to back precautions. Educated pt re: back precautions, safety and compensatory strategies for performing ADL and functional transfers while maintaining precautions; Pt requiring min cues for maintaining precautions during functional tasks today. Pt also with x1 episode of nausea during standing grooming ADL requiring seated rest, reports subside with sitting/resting and pt reports feeling better/able to continue participating with therapies after (BP taken and stable). Pt reports will have good support from spouse after discharge home for ADL/iADL PRN. Will continue to follow while she remains in acute setting to maximize her safety and independence with ADL and mobility prior to return home.     Follow Up Recommendations  No OT follow up;Supervision/Assistance - 24 hour    Equipment Recommendations  None recommended by OT(pt's DME needs are met)           Precautions / Restrictions Precautions Precautions: Back Precaution Booklet Issued: Yes (comment) Precaution Comments: issued and reviewed with  pt Required Braces or Orthoses: Spinal Brace Spinal Brace: Lumbar corset;Applied in sitting position Restrictions Weight Bearing Restrictions: No      Mobility Bed Mobility Overal bed mobility: Needs Assistance Bed Mobility: Sidelying to Sit   Sidelying to sit: Supervision       General bed mobility comments: pt in sidelying upon arrival to room, able to transition to sitting from flat bed without assist  Transfers Overall transfer level: Needs assistance   Transfers: Sit to/from Stand Sit to Stand: Min guard         General transfer comment: increased effort, no physical assist required but minguard for safety and balance    Balance Overall balance assessment: Needs assistance Sitting-balance support: Feet supported Sitting balance-Leahy Scale: Good     Standing balance support: Single extremity supported;No upper extremity supported;During functional activity Standing balance-Leahy Scale: Fair Standing balance comment: pt able to maintain static balance with minguard for safety; pt noted often seeking single UE support when ambulating throughout room without AD                           ADL either performed or assessed with clinical judgement   ADL Overall ADL's : Needs assistance/impaired Eating/Feeding: Modified independent;Sitting   Grooming: Min guard;Standing;Set up;Sitting;Wash/dry hands;Wash/dry face;Oral care Grooming Details (indicate cue type and reason): performed in sitting and standing as pt with wave of nausea during ADL tasks Upper Body Bathing: Min guard;Sitting   Lower Body Bathing: Min guard;Sit to/from stand;With adaptive equipment Lower Body Bathing Details (indicate cue type and reason): pt reports has LH sponge at home to use for LB bathing; educated to use shower chair for increased safety with task completion, pt verbalizing  understanding Upper Body Dressing : Set up;Min guard;Sitting;Standing Upper Body Dressing Details  (indicate cue type and reason): minguard for lumbar brace management as pt adjusting in standing Lower Body Dressing: Moderate assistance;Sit to/from stand Lower Body Dressing Details (indicate cue type and reason): minguard standing balance; pt requires assist to thread LEs into underwear/pantlegs, reports spouse typically assists with this Toilet Transfer: Min guard;Ambulation   Toileting- Clothing Manipulation and Hygiene: Min guard;Sit to/from stand       Functional mobility during ADLs: Min guard General ADL Comments: pt ambulating without AD in room however noted pt often reaching out for UE support, may benefit from [facility]. pt requires min cues to adhere to back precautions during functional tasks                         Pertinent Vitals/Pain Pain Assessment: Faces Faces Pain Scale: Hurts little more Pain Location: back incision Pain Descriptors / Indicators: Discomfort;Aching;Burning;Sore Pain Intervention(s): Monitored during session;Limited activity within patient's tolerance     Hand Dominance     Extremity/Trunk Assessment Upper Extremity Assessment Upper Extremity Assessment: Overall WFL for tasks assessed   Lower Extremity Assessment Lower Extremity Assessment: Defer to PT evaluation   Cervical / Trunk Assessment Cervical / Trunk Assessment: Other exceptions Cervical / Trunk Exceptions: s/p spinal sx   Communication Communication Communication: No difficulties   Cognition Arousal/Alertness: Awake/alert Behavior During Therapy: WFL for tasks assessed/performed Overall Cognitive Status: Within Functional Limits for tasks assessed                                     General Comments  pt with one episode of nausea while standing in bathroom requiring seated rest break, BP taken and stable, with seated rest pt reports nausea subsided and feeling okay to continue with therapy session; RN also notified of pt request for nausea meds    Exercises      Shoulder Instructions      Home Living Family/patient expects to be discharged to:: Private residence Living Arrangements: Spouse/significant other Available Help at Discharge: Family;Available 24 hours/day Type of Home: House Home Access: Stairs to enter     Home Layout: One level;Two level;Able to live on main level with bedroom/bathroom     Bathroom Shower/Tub: Walk-in shower;Tub/shower unit   Bathroom Toilet: Standard     Home Equipment: Environmental consultant - 2 wheels;Cane - single point;Toilet riser;Shower seat(rise with armrest)          Prior Functioning/Environment Level of Independence: Independent with assistive device(s);Needs assistance  Gait / Transfers Assistance Needed: intermittent use of SPC ADL's / Homemaking Assistance Needed: pt reports spouse assists with ADL tasks including providing supervision for showers            OT Problem List: Decreased strength;Decreased range of motion;Decreased activity tolerance;Impaired balance (sitting and/or standing);Decreased knowledge of use of DME or AE;Decreased knowledge of precautions;Pain      OT Treatment/Interventions: Self-care/ADL training;Therapeutic exercise;Neuromuscular education;DME and/or AE instruction;Therapeutic activities;Patient/family education;Balance training    OT Goals(Current goals can be found in the care plan section) Acute Rehab OT Goals Patient Stated Goal: Independent OT Goal Formulation: With patient Time For Goal Achievement: 01/11/19 Potential to Achieve Goals: Good  OT Frequency: Min 2X/week   Barriers to D/C:            Co-evaluation  AM-PAC OT "6 Clicks" Daily Activity     Outcome Measure Help from another person eating meals?: None Help from another person taking care of personal grooming?: A Little Help from another person toileting, which includes using toliet, bedpan, or urinal?: None Help from another person bathing (including washing, rinsing,  drying)?: A Little Help from another person to put on and taking off regular upper body clothing?: None Help from another person to put on and taking off regular lower body clothing?: A Lot 6 Click Score: 20   End of Session Equipment Utilized During Treatment: Back brace Nurse Communication: Mobility status  Activity Tolerance: Patient tolerated treatment well Patient left: Other (comment)(handoff to PT to begin session)  OT Visit Diagnosis: Other abnormalities of gait and mobility (R26.89);Unsteadiness on feet (R26.81)                Time: 2158-7276 OT Time Calculation (min): 35 min Charges:  OT General Charges $OT Visit: 1 Visit OT Evaluation $OT Eval Low Complexity: 1 Low OT Treatments $Self Care/Home Management : 8-22 mins  Lou Cal, OT Supplemental Rehabilitation Services Pager [phone] Office [phone]   Raymondo Band 12/28/2018, 10:01 AM

## 2018-12-28 NOTE — Progress Notes (Signed)
Patient is discharged from room 3C08 at this time. Alert and in stable condition. IV site d/c'd and instructions read to patient with understanding verbalized. Left unit via wheelchair with all belongings at side. 

## 2018-12-28 NOTE — Progress Notes (Signed)
Subjective: 1 Day Post-Op Procedure(s) (LRB): Posterior spinal fusion interbody L4-S1, posterior decompression L4-5 (N/A) Patient reports pain as mild.  Leg pain significantly resolved. Mild incisional back pain.  Tolerating PO without N/V +void +flatus +ambulation Reports mild positional dizziness with ambulation, mostly yesterday, seems to have imprvoed today. Denies HA, CP, SOB, calf pain, fevers/sweats/chills.   Objective: Vital signs in last 24 hours: Temp:  [97.2 F (36.2 C)-98.7 F (37.1 C)] 98.2 F (36.8 C) (06/25 0754) Pulse Rate:  [63-83] 72 (06/25 0754) Resp:  [13-19] 18 (06/25 0754) BP: (89-119)/(43-62) 89/47 (06/25 0754) SpO2:  [94 %-100 %] 97 % (06/25 0754)  Intake/Output from previous day: 06/24 0701 - 06/25 0700 In: 1650 [I.V.:1350; IV Piggyback:250] Out: 1900 [Urine:1350; Blood:550] Intake/Output this shift: No intake/output data recorded.  No results for input(s): HGB in the last 72 hours. No results for input(s): WBC, RBC, HCT, PLT in the last 72 hours. No results for input(s): NA, K, CL, CO2, BUN, CREATININE, GLUCOSE, CALCIUM in the last 72 hours. No results for input(s): LABPT, INR in the last 72 hours.  AAOX3 Abdomen soft, non-distended, non-tender Distal sensation to light touch intact.  Hip, knee, ankle ROM intact bilaterally.  - SLR Calves non-tender, no palpable cords, non-swollen, Homan's negative bilaterally.  Incision: moderate bloody drainage onto honey comb dressing. Dressing not soaked. No signs of infection.  Assessment/Plan: 1 Day Post-Op Procedure(s) (LRB): Posterior spinal fusion interbody L4-S1, posterior decompression L4-5 (N/A) Advance diet Up with therapy  DVT ppx: TEDs, SCD's, ambulation Encourage IS Patient to D/C home if things go well with PT today.   Yvonne Kendall Ward 12/28/2018, 8:04 AM

## 2018-12-28 NOTE — Anesthesia Postprocedure Evaluation (Signed)
Anesthesia Post Note  Patient: Maria Jackson  Procedure(s) Performed: Posterior spinal fusion interbody L4-S1, posterior decompression L4-5 (N/A Back)     Patient location during evaluation: PACU Anesthesia Type: General Level of consciousness: awake and alert Pain management: pain level controlled Vital Signs Assessment: post-procedure vital signs reviewed and stable Respiratory status: spontaneous breathing, nonlabored ventilation, respiratory function stable and patient connected to nasal cannula oxygen Cardiovascular status: blood pressure returned to baseline and stable Postop Assessment: no apparent nausea or vomiting Anesthetic complications: no    Last Vitals:  Vitals:   12/28/18 0447 12/28/18 0520  BP: (!) 102/47 (!) 112/54  Pulse: 66 69  Resp: 18 18  Temp: 36.9 C 37.1 C  SpO2: 98% 99%    Last Pain:  Vitals:   12/28/18 0520  TempSrc: Oral  PainSc:                  [name]
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
## 2019-01-17 ENCOUNTER — Other Ambulatory Visit: Payer: Medicare Other

## 2019-01-17 ENCOUNTER — Ambulatory Visit: Payer: Medicare Other | Admitting: Oncology

## 2019-01-31 DIAGNOSIS — Z85828 Personal history of other malignant neoplasm of skin: Secondary | ICD-10-CM | POA: Diagnosis not present

## 2019-01-31 DIAGNOSIS — L820 Inflamed seborrheic keratosis: Secondary | ICD-10-CM | POA: Diagnosis not present

## 2019-01-31 DIAGNOSIS — D2261 Melanocytic nevi of right upper limb, including shoulder: Secondary | ICD-10-CM | POA: Diagnosis not present

## 2019-01-31 DIAGNOSIS — M810 Age-related osteoporosis without current pathological fracture: Secondary | ICD-10-CM | POA: Diagnosis not present

## 2019-01-31 DIAGNOSIS — D2262 Melanocytic nevi of left upper limb, including shoulder: Secondary | ICD-10-CM | POA: Diagnosis not present

## 2019-02-12 DIAGNOSIS — C50919 Malignant neoplasm of unspecified site of unspecified female breast: Secondary | ICD-10-CM | POA: Insufficient documentation

## 2019-02-12 DIAGNOSIS — Z4889 Encounter for other specified surgical aftercare: Secondary | ICD-10-CM | POA: Diagnosis not present

## 2019-02-13 IMAGING — CT CT BIOPSY
3 of 5 series · 14 of 32 positions shown, 19 images · non-contrast
Comparison: none

CLINICAL DATA: Left-sided back, pelvic and leg symptoms.

[Series 3: needle -guided injection · axial · 0.84mm/px · z∈[-112,-56]mm · 5 of 43 slices shown, 10 images (1 of 3)]
[im 8/43  soft-tissue]
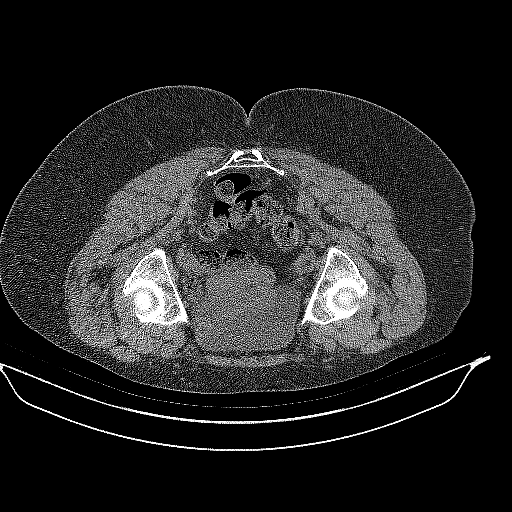
[im 8/43  bone]
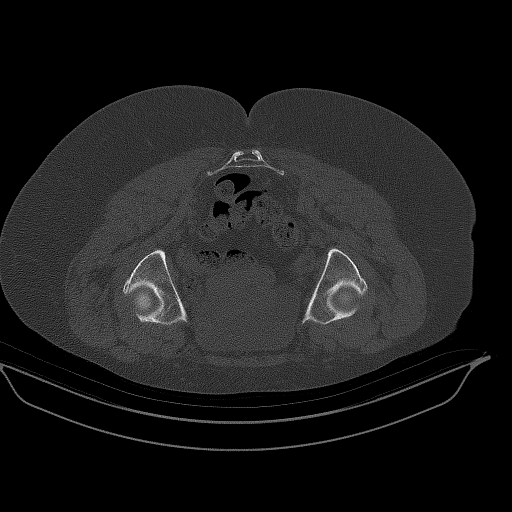
[im 15/43  soft-tissue]
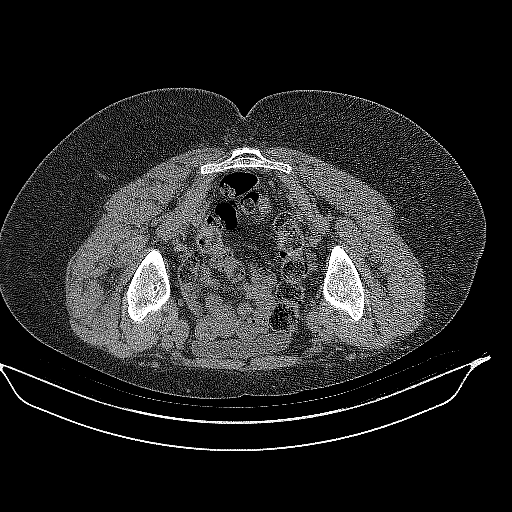
[im 15/43  lung]
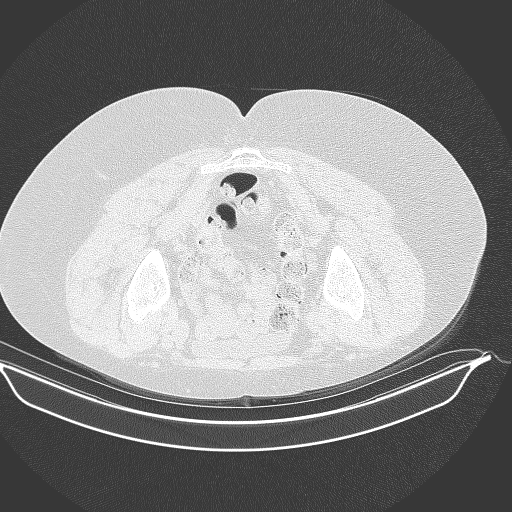
[im 22/43  soft-tissue]
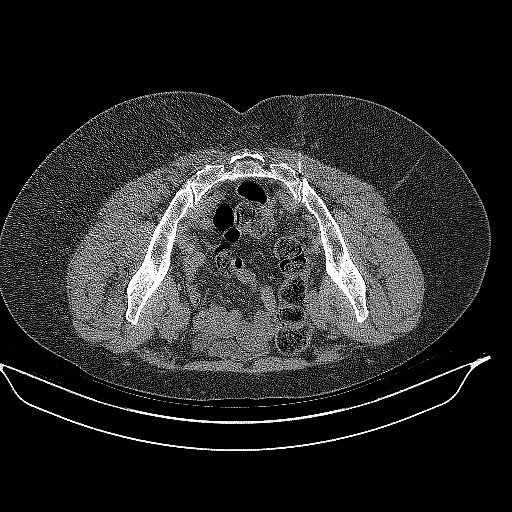
[im 22/43  lung]
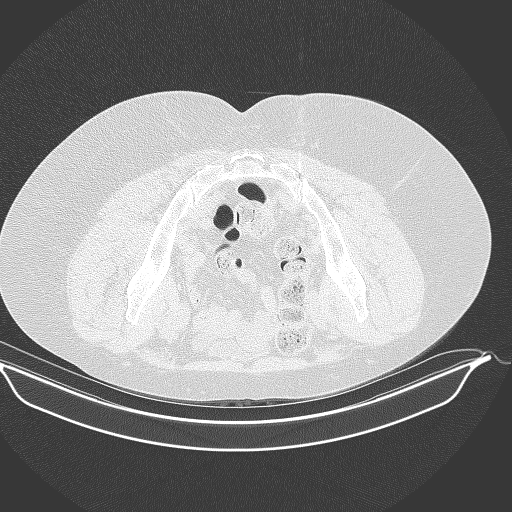
[im 29/43  soft-tissue]
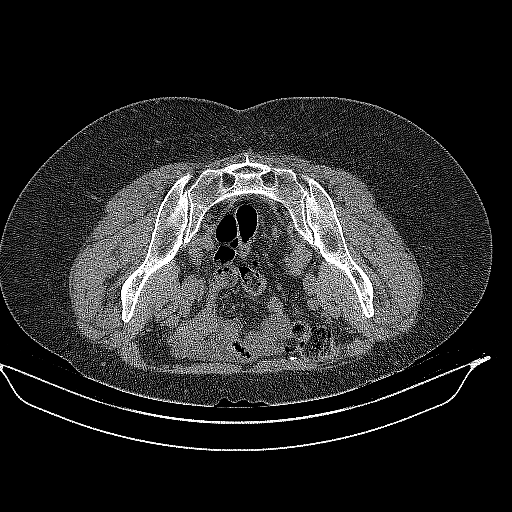
[im 29/43  lung]
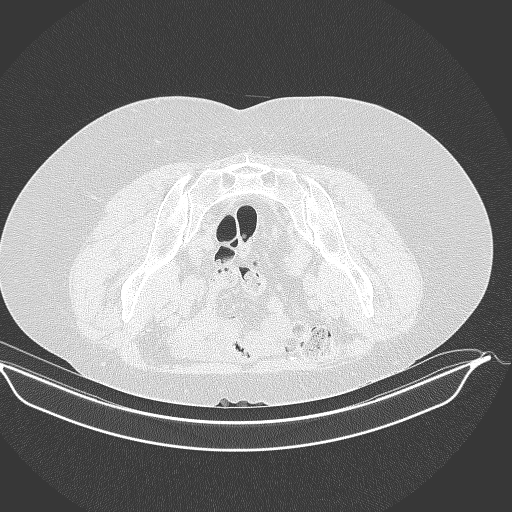
[im 36/43  soft-tissue]
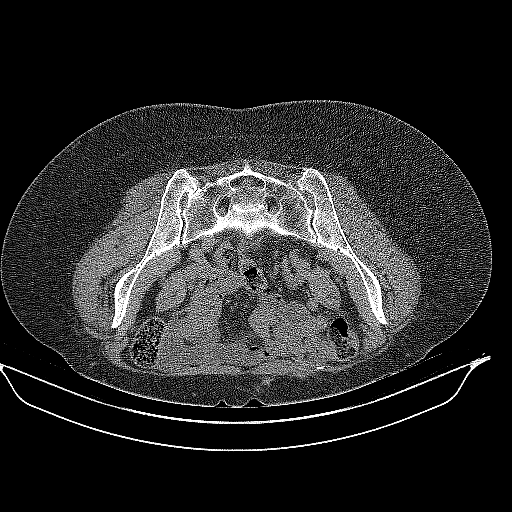
[im 36/43  lung]
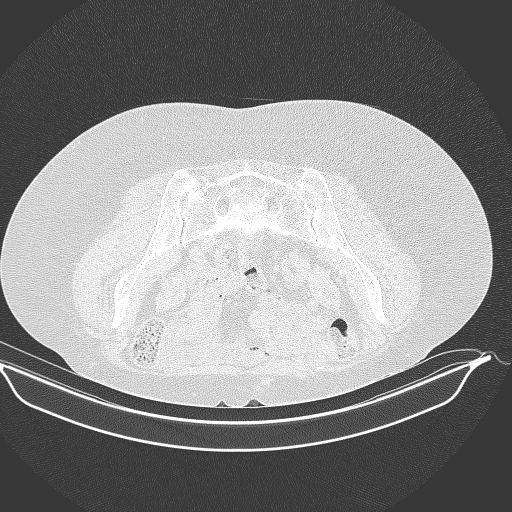

[Series 4: needle -guided injection · axial · 0.84mm/px · z∈[-112,-56]mm · 5 of 43 slices shown (2 of 3)]
[im 8/43  soft-tissue]
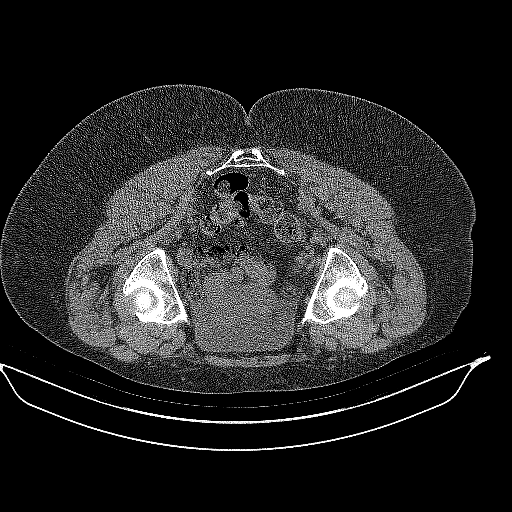
[im 15/43  soft-tissue]
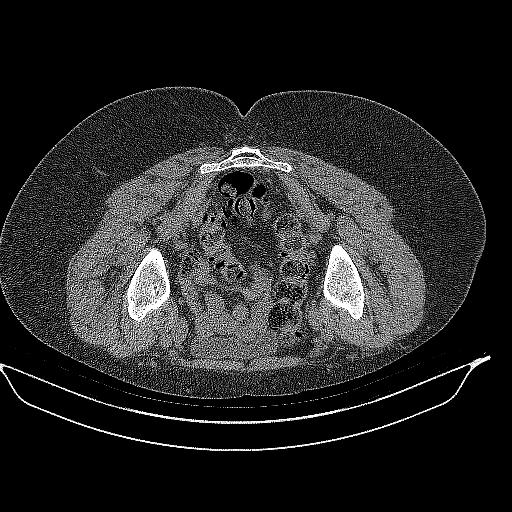
[im 22/43  soft-tissue]
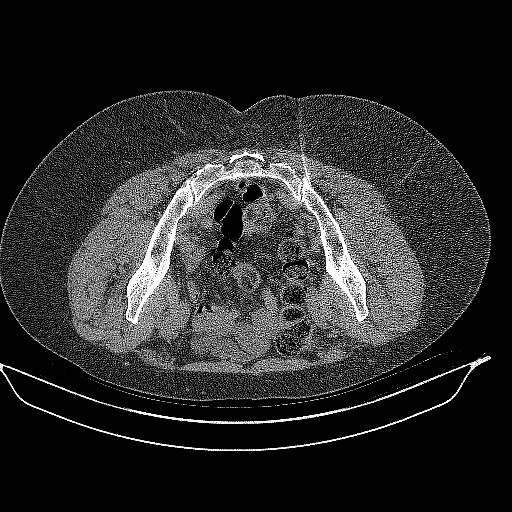
[im 29/43  soft-tissue]
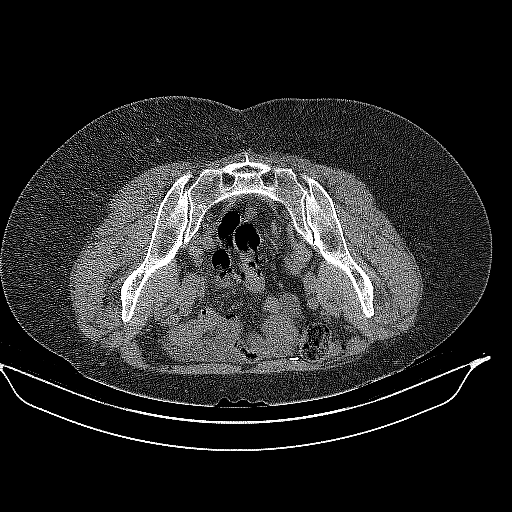
[im 36/43  soft-tissue]
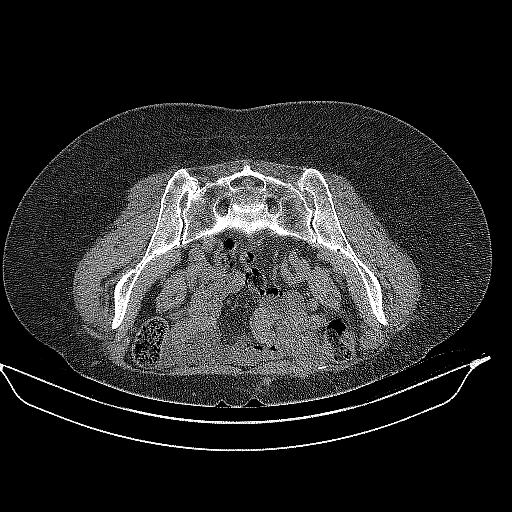

[Series 5: needle -guided injection · axial · 0.84mm/px · z∈[-112,-70]mm · 4 of 43 slices shown (3 of 3)]
[im 8/43  soft-tissue]
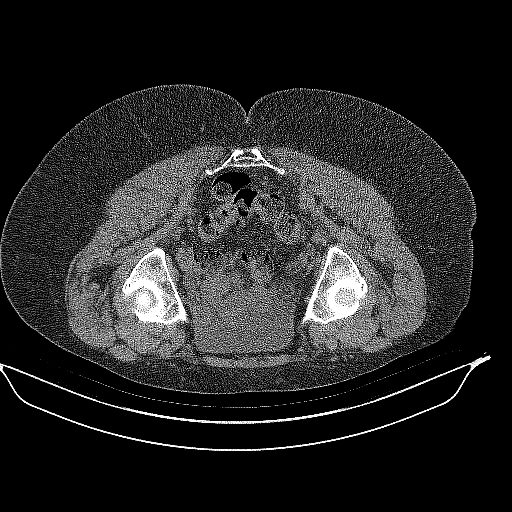
[im 15/43  soft-tissue]
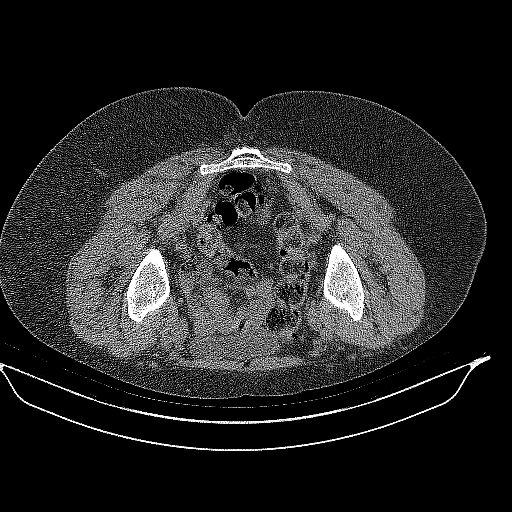
[im 22/43  soft-tissue]
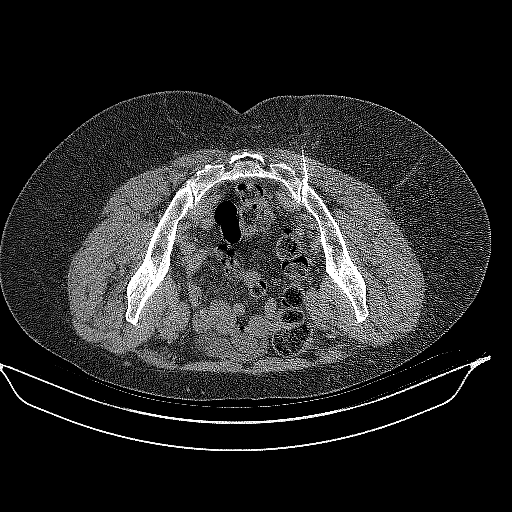
[im 29/43  soft-tissue]
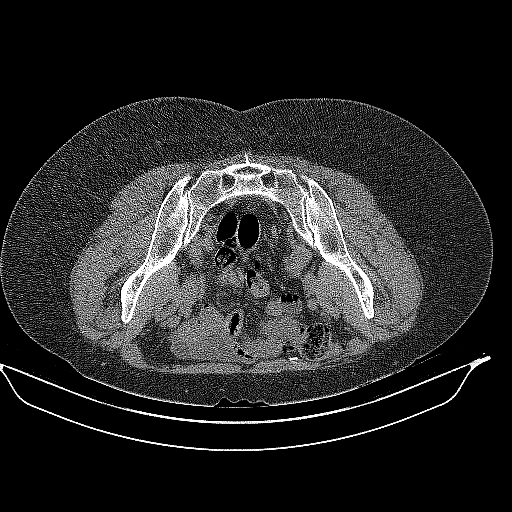

[14 of 32 positions shown; findings below may reference images not displayed]

EXAM:
Left CT GUIDED SI JOINT INJECTION



After local anesthesia with 1% lidocaine without epinephrine and
subsequent deep anesthesia, a 3.5 inch 22 gauge spinal needle was
advanced into the left SI joint under intermittent CT guidance.

Once the needle was in satisfactory position, representative image
was captured with the needle demonstrated in the sacroiliac joint.
Subsequently, 2.5 cc 0.5% bupivacaine was injected into the left SI
joint. Needles removed and a sterile dressing applied.

No complications were observed.

During the injection, the patient experienced concordant symptoms.
The expected time course of the anesthetic was described to the
patient, and she was asked to keep a written pain log for the next
5-6 hours.
IMPRESSION: Successful CT-guided left SI joint injection. Concordant pain during
the injection. See above.

## 2019-02-14 ENCOUNTER — Ambulatory Visit: Payer: Medicare Other | Attending: Orthopedic Surgery | Admitting: Physical Therapy

## 2019-02-14 ENCOUNTER — Other Ambulatory Visit: Payer: Self-pay

## 2019-02-14 DIAGNOSIS — M545 Low back pain: Secondary | ICD-10-CM | POA: Diagnosis not present

## 2019-02-14 DIAGNOSIS — R279 Unspecified lack of coordination: Secondary | ICD-10-CM | POA: Insufficient documentation

## 2019-02-14 DIAGNOSIS — G8929 Other chronic pain: Secondary | ICD-10-CM

## 2019-02-14 DIAGNOSIS — M6281 Muscle weakness (generalized): Secondary | ICD-10-CM | POA: Insufficient documentation

## 2019-02-14 DIAGNOSIS — M62838 Other muscle spasm: Secondary | ICD-10-CM | POA: Insufficient documentation
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
## 2019-02-16 DIAGNOSIS — M810 Age-related osteoporosis without current pathological fracture: Secondary | ICD-10-CM | POA: Diagnosis not present

## 2019-02-20 ENCOUNTER — Other Ambulatory Visit: Payer: Self-pay

## 2019-02-20 ENCOUNTER — Ambulatory Visit: Payer: Medicare Other | Admitting: *Deleted

## 2019-02-20 DIAGNOSIS — M545 Low back pain, unspecified: Secondary | ICD-10-CM

## 2019-02-20 DIAGNOSIS — R279 Unspecified lack of coordination: Secondary | ICD-10-CM

## 2019-02-20 DIAGNOSIS — M62838 Other muscle spasm: Secondary | ICD-10-CM | POA: Diagnosis not present

## 2019-02-20 DIAGNOSIS — M6281 Muscle weakness (generalized): Secondary | ICD-10-CM

## 2019-02-20 DIAGNOSIS — G8929 Other chronic pain: Secondary | ICD-10-CM
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
## 2019-02-21 ENCOUNTER — Encounter: Payer: Self-pay | Admitting: Podiatry

## 2019-02-21 ENCOUNTER — Ambulatory Visit (INDEPENDENT_AMBULATORY_CARE_PROVIDER_SITE_OTHER): Payer: Medicare Other | Admitting: Podiatry

## 2019-02-21 VITALS — Temp 98.0°F

## 2019-02-21 DIAGNOSIS — M79674 Pain in right toe(s): Secondary | ICD-10-CM | POA: Diagnosis not present

## 2019-02-21 DIAGNOSIS — L03032 Cellulitis of left toe: Secondary | ICD-10-CM

## 2019-02-21 DIAGNOSIS — M79675 Pain in left toe(s): Secondary | ICD-10-CM

## 2019-02-21 DIAGNOSIS — B351 Tinea unguium: Secondary | ICD-10-CM

## 2019-02-21 NOTE — Progress Notes (Signed)
Subjective:   Patient ID: Maria Jackson, female   DOB: 73 y.o.   MRN: [id number]   HPI Patient states that she has an ingrown toenail of the left big toe and red irritation and she just had back fusion 8 weeks ago and has nail disease 1-5 both feet that she cannot take care of herself and can become painful   ROS      Objective:  Physical Exam  Neurovascular status was found to be intact with inflammation redness of the medial border left hallux that is more proximal with no proximal edema erythema drainage noted past the nailbed.  Incurvated thickened nailbeds 1-5 both feet that are painful     Assessment:  Paronychia infection left hallux medial border along with mycotic nail infection 1-5 both feet with pain     Plan:  H&P discussed both conditions and for the paronychia I infiltrated the left hallux 60 mg like a Marcaine mixture remove the medial border with sterile instrumentation flush the area out and applied sterile dressing.  I debrided nailbeds 1-5 both feet no iatrogenic bleeding and reappoint for routine care as needed

## 2019-02-22 ENCOUNTER — Ambulatory Visit: Payer: Medicare Other | Admitting: *Deleted

## 2019-02-22 ENCOUNTER — Other Ambulatory Visit: Payer: Self-pay

## 2019-02-22 DIAGNOSIS — M6281 Muscle weakness (generalized): Secondary | ICD-10-CM

## 2019-02-22 DIAGNOSIS — M545 Low back pain, unspecified: Secondary | ICD-10-CM

## 2019-02-22 DIAGNOSIS — R279 Unspecified lack of coordination: Secondary | ICD-10-CM

## 2019-02-22 DIAGNOSIS — M62838 Other muscle spasm: Secondary | ICD-10-CM | POA: Diagnosis not present

## 2019-02-22 DIAGNOSIS — G8929 Other chronic pain: Secondary | ICD-10-CM
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
## 2019-02-23 ENCOUNTER — Encounter: Payer: Medicare Other | Admitting: *Deleted

## 2019-02-23 DIAGNOSIS — H9201 Otalgia, right ear: Secondary | ICD-10-CM | POA: Diagnosis not present

## 2019-02-23 DIAGNOSIS — S00411A Abrasion of right ear, initial encounter: Secondary | ICD-10-CM | POA: Diagnosis not present

## 2019-02-27 ENCOUNTER — Ambulatory Visit: Payer: Medicare Other | Admitting: *Deleted

## 2019-02-27 ENCOUNTER — Other Ambulatory Visit: Payer: Self-pay

## 2019-02-27 DIAGNOSIS — G8929 Other chronic pain: Secondary | ICD-10-CM

## 2019-02-27 DIAGNOSIS — M545 Low back pain, unspecified: Secondary | ICD-10-CM

## 2019-02-27 DIAGNOSIS — M62838 Other muscle spasm: Secondary | ICD-10-CM | POA: Diagnosis not present

## 2019-02-27 DIAGNOSIS — R279 Unspecified lack of coordination: Secondary | ICD-10-CM

## 2019-02-27 DIAGNOSIS — M6281 Muscle weakness (generalized): Secondary | ICD-10-CM | POA: Diagnosis not present
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
## 2019-03-02 ENCOUNTER — Other Ambulatory Visit: Payer: Self-pay

## 2019-03-02 ENCOUNTER — Ambulatory Visit: Payer: Medicare Other | Admitting: *Deleted

## 2019-03-02 DIAGNOSIS — M545 Low back pain, unspecified: Secondary | ICD-10-CM

## 2019-03-02 DIAGNOSIS — M62838 Other muscle spasm: Secondary | ICD-10-CM

## 2019-03-02 DIAGNOSIS — G8929 Other chronic pain: Secondary | ICD-10-CM

## 2019-03-02 DIAGNOSIS — R279 Unspecified lack of coordination: Secondary | ICD-10-CM

## 2019-03-02 DIAGNOSIS — M6281 Muscle weakness (generalized): Secondary | ICD-10-CM
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
## 2019-03-06 ENCOUNTER — Ambulatory Visit: Payer: Medicare Other | Attending: Orthopedic Surgery | Admitting: *Deleted

## 2019-03-06 ENCOUNTER — Other Ambulatory Visit: Payer: Self-pay

## 2019-03-06 DIAGNOSIS — R279 Unspecified lack of coordination: Secondary | ICD-10-CM

## 2019-03-06 DIAGNOSIS — M545 Low back pain: Secondary | ICD-10-CM | POA: Insufficient documentation

## 2019-03-06 DIAGNOSIS — M6281 Muscle weakness (generalized): Secondary | ICD-10-CM | POA: Diagnosis not present

## 2019-03-06 DIAGNOSIS — M62838 Other muscle spasm: Secondary | ICD-10-CM | POA: Insufficient documentation

## 2019-03-06 DIAGNOSIS — G8929 Other chronic pain: Secondary | ICD-10-CM | POA: Insufficient documentation
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
## 2019-03-08 ENCOUNTER — Ambulatory Visit: Payer: Medicare Other | Admitting: *Deleted

## 2019-03-09 ENCOUNTER — Ambulatory Visit: Payer: Medicare Other | Admitting: *Deleted

## 2019-03-09 ENCOUNTER — Other Ambulatory Visit: Payer: Self-pay

## 2019-03-09 DIAGNOSIS — G8929 Other chronic pain: Secondary | ICD-10-CM | POA: Diagnosis not present

## 2019-03-09 DIAGNOSIS — R279 Unspecified lack of coordination: Secondary | ICD-10-CM | POA: Diagnosis not present

## 2019-03-09 DIAGNOSIS — M6281 Muscle weakness (generalized): Secondary | ICD-10-CM | POA: Diagnosis not present

## 2019-03-09 DIAGNOSIS — M62838 Other muscle spasm: Secondary | ICD-10-CM | POA: Diagnosis not present

## 2019-03-09 DIAGNOSIS — M545 Low back pain: Secondary | ICD-10-CM | POA: Diagnosis not present
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
## 2019-03-13 ENCOUNTER — Other Ambulatory Visit: Payer: Self-pay

## 2019-03-13 ENCOUNTER — Ambulatory Visit: Payer: Medicare Other | Admitting: *Deleted

## 2019-03-13 DIAGNOSIS — M62838 Other muscle spasm: Secondary | ICD-10-CM | POA: Diagnosis not present

## 2019-03-13 DIAGNOSIS — M545 Low back pain, unspecified: Secondary | ICD-10-CM

## 2019-03-13 DIAGNOSIS — M6281 Muscle weakness (generalized): Secondary | ICD-10-CM | POA: Diagnosis not present

## 2019-03-13 DIAGNOSIS — R279 Unspecified lack of coordination: Secondary | ICD-10-CM

## 2019-03-13 DIAGNOSIS — G8929 Other chronic pain: Secondary | ICD-10-CM
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
## 2019-03-14 DIAGNOSIS — Z85828 Personal history of other malignant neoplasm of skin: Secondary | ICD-10-CM | POA: Diagnosis not present

## 2019-03-14 DIAGNOSIS — L570 Actinic keratosis: Secondary | ICD-10-CM | POA: Diagnosis not present

## 2019-03-16 DIAGNOSIS — H9193 Unspecified hearing loss, bilateral: Secondary | ICD-10-CM | POA: Diagnosis not present

## 2019-03-16 DIAGNOSIS — S00411A Abrasion of right ear, initial encounter: Secondary | ICD-10-CM | POA: Diagnosis not present

## 2019-03-16 DIAGNOSIS — H9201 Otalgia, right ear: Secondary | ICD-10-CM | POA: Diagnosis not present

## 2019-03-22 ENCOUNTER — Telehealth: Payer: Self-pay | Admitting: Oncology

## 2019-03-22 NOTE — Telephone Encounter (Signed)
Patient returned phone call regarding voicemail that was left, per patient's request 09/21 appointment has been rescheduled for 10/07.

## 2019-03-22 NOTE — Telephone Encounter (Signed)
Returned patient's phone call regarding rescheduling 09/21 appointment, left a voicemail.

## 2019-03-23 DIAGNOSIS — M810 Age-related osteoporosis without current pathological fracture: Secondary | ICD-10-CM | POA: Diagnosis not present

## 2019-03-23 DIAGNOSIS — R7989 Other specified abnormal findings of blood chemistry: Secondary | ICD-10-CM | POA: Diagnosis not present

## 2019-03-26 ENCOUNTER — Inpatient Hospital Stay: Payer: Medicare Other | Admitting: Oncology

## 2019-03-26 DIAGNOSIS — R7989 Other specified abnormal findings of blood chemistry: Secondary | ICD-10-CM | POA: Diagnosis not present

## 2019-03-26 DIAGNOSIS — R82998 Other abnormal findings in urine: Secondary | ICD-10-CM | POA: Diagnosis not present

## 2019-03-26 DIAGNOSIS — Z981 Arthrodesis status: Secondary | ICD-10-CM | POA: Diagnosis not present

## 2019-03-26 DIAGNOSIS — D509 Iron deficiency anemia, unspecified: Secondary | ICD-10-CM | POA: Diagnosis not present

## 2019-03-29 DIAGNOSIS — Z1212 Encounter for screening for malignant neoplasm of rectum: Secondary | ICD-10-CM | POA: Diagnosis not present

## 2019-04-05 DIAGNOSIS — Z23 Encounter for immunization: Secondary | ICD-10-CM | POA: Diagnosis not present

## 2019-04-11 ENCOUNTER — Encounter: Payer: Self-pay | Admitting: Adult Health

## 2019-04-11 ENCOUNTER — Inpatient Hospital Stay: Payer: Medicare Other | Attending: Adult Health | Admitting: Adult Health

## 2019-04-11 ENCOUNTER — Other Ambulatory Visit: Payer: Self-pay

## 2019-04-11 VITALS — BP 140/62 | HR 70 | Temp 98.9°F | Resp 18 | Ht 62.0 in | Wt 160.8 lb

## 2019-04-11 DIAGNOSIS — Z6829 Body mass index (BMI) 29.0-29.9, adult: Secondary | ICD-10-CM | POA: Diagnosis not present

## 2019-04-11 DIAGNOSIS — Z170 Estrogen receptor positive status [ER+]: Secondary | ICD-10-CM | POA: Diagnosis not present

## 2019-04-11 DIAGNOSIS — Z808 Family history of malignant neoplasm of other organs or systems: Secondary | ICD-10-CM | POA: Diagnosis not present

## 2019-04-11 DIAGNOSIS — C50311 Malignant neoplasm of lower-inner quadrant of right female breast: Secondary | ICD-10-CM

## 2019-04-11 DIAGNOSIS — Z85828 Personal history of other malignant neoplasm of skin: Secondary | ICD-10-CM | POA: Insufficient documentation

## 2019-04-11 DIAGNOSIS — Z7289 Other problems related to lifestyle: Secondary | ICD-10-CM | POA: Diagnosis not present

## 2019-04-11 DIAGNOSIS — M810 Age-related osteoporosis without current pathological fracture: Secondary | ICD-10-CM | POA: Diagnosis not present

## 2019-04-11 DIAGNOSIS — Z833 Family history of diabetes mellitus: Secondary | ICD-10-CM | POA: Diagnosis not present

## 2019-04-11 DIAGNOSIS — Z79899 Other long term (current) drug therapy: Secondary | ICD-10-CM | POA: Diagnosis not present

## 2019-04-11 DIAGNOSIS — Z8249 Family history of ischemic heart disease and other diseases of the circulatory system: Secondary | ICD-10-CM | POA: Insufficient documentation
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
## 2019-04-12 ENCOUNTER — Telehealth: Payer: Self-pay | Admitting: Oncology

## 2019-04-12 NOTE — Telephone Encounter (Signed)
I talk with patient regarding schedule  

## 2019-04-17 DIAGNOSIS — H9201 Otalgia, right ear: Secondary | ICD-10-CM | POA: Diagnosis not present

## 2019-04-17 DIAGNOSIS — H9312 Tinnitus, left ear: Secondary | ICD-10-CM | POA: Diagnosis not present

## 2019-04-17 DIAGNOSIS — H90A22 Sensorineural hearing loss, unilateral, left ear, with restricted hearing on the contralateral side: Secondary | ICD-10-CM | POA: Diagnosis not present

## 2019-04-17 DIAGNOSIS — H918X1 Other specified hearing loss, right ear: Secondary | ICD-10-CM | POA: Diagnosis not present

## 2019-04-17 DIAGNOSIS — S00411A Abrasion of right ear, initial encounter: Secondary | ICD-10-CM | POA: Diagnosis not present

## 2019-04-17 DIAGNOSIS — H90A31 Mixed conductive and sensorineural hearing loss, unilateral, right ear with restricted hearing on the contralateral side: Secondary | ICD-10-CM | POA: Diagnosis not present

## 2019-06-25 DIAGNOSIS — Z981 Arthrodesis status: Secondary | ICD-10-CM | POA: Diagnosis not present

## 2019-06-27 ENCOUNTER — Other Ambulatory Visit: Payer: Self-pay | Admitting: Oncology

## 2019-07-18 DIAGNOSIS — R928 Other abnormal and inconclusive findings on diagnostic imaging of breast: Secondary | ICD-10-CM | POA: Diagnosis not present

## 2019-07-21 DIAGNOSIS — M533 Sacrococcygeal disorders, not elsewhere classified: Secondary | ICD-10-CM | POA: Diagnosis not present

## 2019-07-27 DIAGNOSIS — H918X1 Other specified hearing loss, right ear: Secondary | ICD-10-CM | POA: Diagnosis not present

## 2019-08-03 DIAGNOSIS — H903 Sensorineural hearing loss, bilateral: Secondary | ICD-10-CM | POA: Diagnosis not present

## 2019-08-16 DIAGNOSIS — M810 Age-related osteoporosis without current pathological fracture: Secondary | ICD-10-CM | POA: Diagnosis not present

## 2019-08-21 DIAGNOSIS — M810 Age-related osteoporosis without current pathological fracture: Secondary | ICD-10-CM | POA: Diagnosis not present

## 2019-09-04 DIAGNOSIS — H6121 Impacted cerumen, right ear: Secondary | ICD-10-CM | POA: Diagnosis not present

## 2019-09-04 DIAGNOSIS — H903 Sensorineural hearing loss, bilateral: Secondary | ICD-10-CM | POA: Diagnosis not present

## 2019-09-04 DIAGNOSIS — S00411A Abrasion of right ear, initial encounter: Secondary | ICD-10-CM | POA: Diagnosis not present

## 2019-09-18 ENCOUNTER — Other Ambulatory Visit: Payer: Self-pay | Admitting: Oncology

## 2019-09-21 DIAGNOSIS — H5021 Vertical strabismus, right eye: Secondary | ICD-10-CM | POA: Diagnosis not present

## 2019-09-21 DIAGNOSIS — H04123 Dry eye syndrome of bilateral lacrimal glands: Secondary | ICD-10-CM | POA: Diagnosis not present

## 2019-09-21 DIAGNOSIS — H40013 Open angle with borderline findings, low risk, bilateral: Secondary | ICD-10-CM | POA: Diagnosis not present

## 2019-09-21 DIAGNOSIS — H532 Diplopia: Secondary | ICD-10-CM | POA: Diagnosis not present
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
## 2019-10-10 ENCOUNTER — Inpatient Hospital Stay: Payer: Medicare Other | Attending: Oncology | Admitting: Oncology

## 2019-10-10 ENCOUNTER — Other Ambulatory Visit: Payer: Self-pay

## 2019-10-10 VITALS — BP 138/50 | HR 62 | Temp 98.5°F | Resp 18 | Ht 62.0 in | Wt 162.0 lb

## 2019-10-10 DIAGNOSIS — Z833 Family history of diabetes mellitus: Secondary | ICD-10-CM | POA: Insufficient documentation

## 2019-10-10 DIAGNOSIS — Z90722 Acquired absence of ovaries, bilateral: Secondary | ICD-10-CM | POA: Insufficient documentation

## 2019-10-10 DIAGNOSIS — C50311 Malignant neoplasm of lower-inner quadrant of right female breast: Secondary | ICD-10-CM

## 2019-10-10 DIAGNOSIS — Z881 Allergy status to other antibiotic agents status: Secondary | ICD-10-CM | POA: Diagnosis not present

## 2019-10-10 DIAGNOSIS — Z7289 Other problems related to lifestyle: Secondary | ICD-10-CM | POA: Diagnosis not present

## 2019-10-10 DIAGNOSIS — Z79899 Other long term (current) drug therapy: Secondary | ICD-10-CM | POA: Insufficient documentation

## 2019-10-10 DIAGNOSIS — Z170 Estrogen receptor positive status [ER+]: Secondary | ICD-10-CM | POA: Insufficient documentation

## 2019-10-10 DIAGNOSIS — M48061 Spinal stenosis, lumbar region without neurogenic claudication: Secondary | ICD-10-CM | POA: Diagnosis not present

## 2019-10-10 DIAGNOSIS — Z79810 Long term (current) use of selective estrogen receptor modulators (SERMs): Secondary | ICD-10-CM | POA: Diagnosis not present

## 2019-10-10 DIAGNOSIS — Z8249 Family history of ischemic heart disease and other diseases of the circulatory system: Secondary | ICD-10-CM | POA: Insufficient documentation

## 2019-10-10 DIAGNOSIS — N952 Postmenopausal atrophic vaginitis: Secondary | ICD-10-CM

## 2019-10-10 DIAGNOSIS — Z886 Allergy status to analgesic agent status: Secondary | ICD-10-CM | POA: Insufficient documentation

## 2019-10-10 DIAGNOSIS — M1990 Unspecified osteoarthritis, unspecified site: Secondary | ICD-10-CM | POA: Diagnosis not present

## 2019-10-10 DIAGNOSIS — Z882 Allergy status to sulfonamides status: Secondary | ICD-10-CM | POA: Diagnosis not present

## 2019-10-10 DIAGNOSIS — D0511 Intraductal carcinoma in situ of right breast: Secondary | ICD-10-CM | POA: Diagnosis not present

## 2019-10-10 DIAGNOSIS — Z880 Allergy status to penicillin: Secondary | ICD-10-CM | POA: Diagnosis not present

## 2019-10-10 DIAGNOSIS — M818 Other osteoporosis without current pathological fracture: Secondary | ICD-10-CM | POA: Diagnosis not present

## 2019-10-10 MED ORDER — TAMOXIFEN CITRATE 20 MG PO TABS: 20.0000 mg | ORAL_TABLET | Freq: Every day | ORAL | 4 refills | Status: DC

## 2019-10-31 DIAGNOSIS — H2513 Age-related nuclear cataract, bilateral: Secondary | ICD-10-CM | POA: Diagnosis not present

## 2019-10-31 DIAGNOSIS — H04123 Dry eye syndrome of bilateral lacrimal glands: Secondary | ICD-10-CM | POA: Diagnosis not present

## 2019-10-31 DIAGNOSIS — H40013 Open angle with borderline findings, low risk, bilateral: Secondary | ICD-10-CM | POA: Diagnosis not present

## 2019-10-31 DIAGNOSIS — H5021 Vertical strabismus, right eye: Secondary | ICD-10-CM | POA: Diagnosis not present

## 2019-11-26 DIAGNOSIS — H2512 Age-related nuclear cataract, left eye: Secondary | ICD-10-CM | POA: Diagnosis not present

## 2019-12-11 DIAGNOSIS — H2512 Age-related nuclear cataract, left eye: Secondary | ICD-10-CM | POA: Diagnosis not present

## 2019-12-19 ENCOUNTER — Observation Stay (HOSPITAL_COMMUNITY): Payer: Medicare Other

## 2019-12-19 ENCOUNTER — Observation Stay (HOSPITAL_COMMUNITY): Payer: Medicare Other | Admitting: Anesthesiology

## 2019-12-19 ENCOUNTER — Emergency Department (HOSPITAL_COMMUNITY): Payer: Medicare Other

## 2019-12-19 ENCOUNTER — Observation Stay (HOSPITAL_COMMUNITY)
Admission: EM | Admit: 2019-12-19 | Discharge: 2019-12-19 | Disposition: A | Discharge status: Home / Self Care | Payer: Medicare Other | Attending: Emergency Medicine | Admitting: Emergency Medicine

## 2019-12-19 ENCOUNTER — Encounter (HOSPITAL_COMMUNITY)
Admission: EM | Disposition: A | Discharge status: Home / Self Care | Payer: Self-pay | Source: Home / Self Care | Attending: Emergency Medicine

## 2019-12-19 ENCOUNTER — Encounter (HOSPITAL_COMMUNITY): Payer: Self-pay

## 2019-12-19 DIAGNOSIS — Z7983 Long term (current) use of bisphosphonates: Secondary | ICD-10-CM | POA: Insufficient documentation

## 2019-12-19 DIAGNOSIS — Z419 Encounter for procedure for purposes other than remedying health state, unspecified: Secondary | ICD-10-CM

## 2019-12-19 DIAGNOSIS — Z79890 Hormone replacement therapy (postmenopausal): Secondary | ICD-10-CM | POA: Diagnosis not present

## 2019-12-19 DIAGNOSIS — Z86000 Personal history of in-situ neoplasm of breast: Secondary | ICD-10-CM | POA: Diagnosis not present

## 2019-12-19 DIAGNOSIS — Z882 Allergy status to sulfonamides status: Secondary | ICD-10-CM | POA: Insufficient documentation

## 2019-12-19 DIAGNOSIS — Z96653 Presence of artificial knee joint, bilateral: Secondary | ICD-10-CM | POA: Insufficient documentation

## 2019-12-19 DIAGNOSIS — Z20822 Contact with and (suspected) exposure to covid-19: Secondary | ICD-10-CM | POA: Insufficient documentation

## 2019-12-19 DIAGNOSIS — C50311 Malignant neoplasm of lower-inner quadrant of right female breast: Secondary | ICD-10-CM | POA: Diagnosis not present

## 2019-12-19 DIAGNOSIS — S8001XA Contusion of right knee, initial encounter: Secondary | ICD-10-CM | POA: Diagnosis not present

## 2019-12-19 DIAGNOSIS — Y9301 Activity, walking, marching and hiking: Secondary | ICD-10-CM | POA: Insufficient documentation

## 2019-12-19 DIAGNOSIS — Z79899 Other long term (current) drug therapy: Secondary | ICD-10-CM | POA: Insufficient documentation

## 2019-12-19 DIAGNOSIS — S52572A Other intraarticular fracture of lower end of left radius, initial encounter for closed fracture: Secondary | ICD-10-CM | POA: Diagnosis not present

## 2019-12-19 DIAGNOSIS — W010XXD Fall on same level from slipping, tripping and stumbling without subsequent striking against object, subsequent encounter: Secondary | ICD-10-CM | POA: Diagnosis not present

## 2019-12-19 DIAGNOSIS — S62001A Unspecified fracture of navicular [scaphoid] bone of right wrist, initial encounter for closed fracture: Secondary | ICD-10-CM | POA: Diagnosis not present

## 2019-12-19 DIAGNOSIS — S52592A Other fractures of lower end of left radius, initial encounter for closed fracture: Secondary | ICD-10-CM | POA: Diagnosis not present

## 2019-12-19 DIAGNOSIS — Z881 Allergy status to other antibiotic agents status: Secondary | ICD-10-CM | POA: Diagnosis not present

## 2019-12-19 DIAGNOSIS — S52502A Unspecified fracture of the lower end of left radius, initial encounter for closed fracture: Secondary | ICD-10-CM | POA: Diagnosis not present

## 2019-12-19 DIAGNOSIS — Z7982 Long term (current) use of aspirin: Secondary | ICD-10-CM | POA: Insufficient documentation

## 2019-12-19 DIAGNOSIS — R0902 Hypoxemia: Secondary | ICD-10-CM | POA: Diagnosis not present

## 2019-12-19 DIAGNOSIS — Z880 Allergy status to penicillin: Secondary | ICD-10-CM | POA: Diagnosis not present

## 2019-12-19 DIAGNOSIS — Z170 Estrogen receptor positive status [ER+]: Secondary | ICD-10-CM | POA: Diagnosis not present

## 2019-12-19 DIAGNOSIS — W19XXXA Unspecified fall, initial encounter: Secondary | ICD-10-CM | POA: Diagnosis not present

## 2019-12-19 DIAGNOSIS — S8991XA Unspecified injury of right lower leg, initial encounter: Secondary | ICD-10-CM | POA: Diagnosis not present

## 2019-12-19 DIAGNOSIS — I959 Hypotension, unspecified: Secondary | ICD-10-CM | POA: Diagnosis not present

## 2019-12-19 DIAGNOSIS — R52 Pain, unspecified: Secondary | ICD-10-CM | POA: Diagnosis not present

## 2019-12-19 DIAGNOSIS — S52602A Unspecified fracture of lower end of left ulna, initial encounter for closed fracture: Secondary | ICD-10-CM | POA: Diagnosis not present

## 2019-12-19 DIAGNOSIS — S62002D Unspecified fracture of navicular [scaphoid] bone of left wrist, subsequent encounter for fracture with routine healing: Secondary | ICD-10-CM | POA: Diagnosis not present

## 2019-12-19 DIAGNOSIS — Z886 Allergy status to analgesic agent status: Secondary | ICD-10-CM | POA: Diagnosis not present

## 2019-12-19 DIAGNOSIS — S8011XA Contusion of right lower leg, initial encounter: Secondary | ICD-10-CM

## 2019-12-19 DIAGNOSIS — Z79810 Long term (current) use of selective estrogen receptor modulators (SERMs): Secondary | ICD-10-CM | POA: Diagnosis not present

## 2019-12-19 DIAGNOSIS — M79662 Pain in left lower leg: Secondary | ICD-10-CM | POA: Diagnosis not present

## 2019-12-19 DIAGNOSIS — M1710 Unilateral primary osteoarthritis, unspecified knee: Secondary | ICD-10-CM | POA: Diagnosis not present

## 2019-12-19 DIAGNOSIS — S52292A Other fracture of shaft of left ulna, initial encounter for closed fracture: Secondary | ICD-10-CM | POA: Diagnosis not present

## 2019-12-19 DIAGNOSIS — S52692D Other fracture of lower end of left ulna, subsequent encounter for closed fracture with routine healing: Secondary | ICD-10-CM | POA: Diagnosis not present

## 2019-12-19 DIAGNOSIS — R609 Edema, unspecified: Secondary | ICD-10-CM | POA: Diagnosis not present

## 2019-12-19 DIAGNOSIS — S52692A Other fracture of lower end of left ulna, initial encounter for closed fracture: Secondary | ICD-10-CM | POA: Diagnosis not present

## 2019-12-19 DIAGNOSIS — M25539 Pain in unspecified wrist: Secondary | ICD-10-CM | POA: Diagnosis not present

## 2019-12-19 DIAGNOSIS — S62002A Unspecified fracture of navicular [scaphoid] bone of left wrist, initial encounter for closed fracture: Secondary | ICD-10-CM | POA: Diagnosis not present

## 2019-12-19 DIAGNOSIS — S52502D Unspecified fracture of the lower end of left radius, subsequent encounter for closed fracture with routine healing: Secondary | ICD-10-CM | POA: Diagnosis not present

## 2019-12-19 HISTORY — PX: OPEN REDUCTION INTERNAL FIXATION (ORIF) DISTAL RADIAL FRACTURE: SHX5989

## 2019-12-19 LAB — CBC WITH DIFFERENTIAL/PLATELET
Abs Immature Granulocytes: 0.02 10*3/uL (ref 0.00–0.07)
Basophils Absolute: 0 10*3/uL (ref 0.0–0.1)
Basophils Relative: 0 %
Eosinophils Absolute: 0 10*3/uL (ref 0.0–0.5)
Eosinophils Relative: 1 %
HCT: 35.4 % — ABNORMAL LOW (ref 36.0–46.0)
Hemoglobin: 11.9 g/dL — ABNORMAL LOW (ref 12.0–15.0)
Immature Granulocytes: 0 %
Lymphocytes Relative: 13 %
Lymphs Abs: 0.9 10*3/uL (ref 0.7–4.0)
MCH: 35 pg — ABNORMAL HIGH (ref 26.0–34.0)
MCHC: 33.6 g/dL (ref 30.0–36.0)
MCV: 104.1 fL — ABNORMAL HIGH (ref 80.0–100.0)
Monocytes Absolute: 1 10*3/uL (ref 0.1–1.0)
Monocytes Relative: 14 %
Neutro Abs: 5.1 10*3/uL (ref 1.7–7.7)
Neutrophils Relative %: 72 %
Platelets: 303 10*3/uL (ref 150–400)
RBC: 3.4 MIL/uL — ABNORMAL LOW (ref 3.87–5.11)
RDW: 13.7 % (ref 11.5–15.5)
WBC: 7.1 10*3/uL (ref 4.0–10.5)
nRBC: 0 % (ref 0.0–0.2)

## 2019-12-19 LAB — BASIC METABOLIC PANEL
Anion gap: 9 (ref 5–15)
BUN: 14 mg/dL (ref 8–23)
CO2: 28 mmol/L (ref 22–32)
Calcium: 8.5 mg/dL — ABNORMAL LOW (ref 8.9–10.3)
Chloride: 99 mmol/L (ref 98–111)
Creatinine, Ser: 0.72 mg/dL (ref 0.44–1.00)
GFR calc Af Amer: >60 mL/min (ref >60)
GFR calc non Af Amer: >60 mL/min (ref >60)
Glucose, Bld: 111 mg/dL — ABNORMAL HIGH (ref 70–99)
Potassium: 3.9 mmol/L (ref 3.5–5.1)
Sodium: 136 mmol/L (ref 135–145)

## 2019-12-19 LAB — SARS CORONAVIRUS 2 BY RT PCR (HOSPITAL ORDER, PERFORMED IN ~~LOC~~ HOSPITAL LAB): SARS Coronavirus 2: NEGATIVE

## 2019-12-19 SURGERY — OPEN REDUCTION INTERNAL FIXATION (ORIF) DISTAL RADIUS FRACTURE
Anesthesia: Monitor Anesthesia Care | Site: Wrist | Laterality: Left

## 2019-12-19 MED ORDER — MIDAZOLAM HCL 2 MG/2ML IJ SOLN
INTRAMUSCULAR | Status: AC
  Administered 2019-12-19: 1 mg via INTRAVENOUS
  Filled 2019-12-19: qty 2

## 2019-12-19 MED ORDER — MORPHINE SULFATE (PF) 4 MG/ML IV SOLN
4.0000 mg | Freq: Once | INTRAVENOUS | Status: AC
  Administered 2019-12-19: 4 mg via INTRAVENOUS
  Filled 2019-12-19: qty 1

## 2019-12-19 MED ORDER — PROMETHAZINE HCL 25 MG/ML IJ SOLN: 6.2500 mg | INTRAMUSCULAR | Status: DC | PRN

## 2019-12-19 MED ORDER — LIDOCAINE 2% (20 MG/ML) 5 ML SYRINGE
INTRAMUSCULAR | Status: AC
  Filled 2019-12-19: qty 5

## 2019-12-19 MED ORDER — FENTANYL CITRATE (PF) 100 MCG/2ML IJ SOLN: 50.0000 ug | Freq: Once | INTRAMUSCULAR | Status: AC

## 2019-12-19 MED ORDER — CHLORHEXIDINE GLUCONATE 0.12 % MT SOLN
15.0000 mL | Freq: Once | OROMUCOSAL | Status: AC
  Administered 2019-12-19: 15 mL via OROMUCOSAL
  Filled 2019-12-19: qty 15

## 2019-12-19 MED ORDER — SODIUM CHLORIDE 0.9 % IV BOLUS
500.0000 mL | Freq: Once | INTRAVENOUS | Status: AC
  Administered 2019-12-19: 500 mL via INTRAVENOUS

## 2019-12-19 MED ORDER — PHENYLEPHRINE HCL-NACL 20-0.9 MG/250ML-% IV SOLN
INTRAVENOUS | Status: DC | PRN
  Administered 2019-12-19: 50 ug/min via INTRAVENOUS

## 2019-12-19 MED ORDER — MIDAZOLAM HCL 2 MG/2ML IJ SOLN: 1.0000 mg | Freq: Once | INTRAMUSCULAR | Status: AC

## 2019-12-19 MED ORDER — CLINDAMYCIN PHOSPHATE 900 MG/50ML IV SOLN
900.0000 mg | Freq: Once | INTRAVENOUS | Status: AC
  Administered 2019-12-19: 900 mg via INTRAVENOUS

## 2019-12-19 MED ORDER — BUPIVACAINE HCL (PF) 0.25 % IJ SOLN
INTRAMUSCULAR | Status: AC
  Filled 2019-12-19: qty 30

## 2019-12-19 MED ORDER — PROPOFOL 500 MG/50ML IV EMUL
INTRAVENOUS | Status: DC | PRN
  Administered 2019-12-19: 75 ug/kg/min via INTRAVENOUS

## 2019-12-19 MED ORDER — LACTATED RINGERS IV SOLN: INTRAVENOUS | Status: DC | PRN

## 2019-12-19 MED ORDER — BUPIVACAINE-EPINEPHRINE (PF) 0.5% -1:200000 IJ SOLN
INTRAMUSCULAR | Status: DC | PRN
  Administered 2019-12-19: 30 mL via PERINEURAL

## 2019-12-19 MED ORDER — ACETAMINOPHEN 500 MG PO TABS
1000.0000 mg | ORAL_TABLET | Freq: Once | ORAL | Status: AC
  Administered 2019-12-19: 1000 mg via ORAL
  Filled 2019-12-19: qty 2

## 2019-12-19 MED ORDER — LIDOCAINE HCL 1 % IJ SOLN
INTRAMUSCULAR | Status: AC
  Filled 2019-12-19: qty 20

## 2019-12-19 MED ORDER — ACETAMINOPHEN 325 MG PO TABS
650.0000 mg | ORAL_TABLET | Freq: Four times a day (QID) | ORAL | Status: DC
Start: 2019-12-19 — End: 2022-09-29

## 2019-12-19 MED ORDER — FENTANYL CITRATE (PF) 100 MCG/2ML IJ SOLN
INTRAMUSCULAR | Status: AC
  Administered 2019-12-19: 50 ug via INTRAVENOUS
  Filled 2019-12-19: qty 2

## 2019-12-19 MED ORDER — LIDOCAINE HCL (PF) 1 % IJ SOLN
INTRAMUSCULAR | Status: AC
  Filled 2019-12-19: qty 30

## 2019-12-19 MED ORDER — OXYCODONE HCL 5 MG/5ML PO SOLN: 5.0000 mg | Freq: Once | ORAL | Status: DC | PRN

## 2019-12-19 MED ORDER — FENTANYL CITRATE (PF) 100 MCG/2ML IJ SOLN: 25.0000 ug | INTRAMUSCULAR | Status: DC | PRN

## 2019-12-19 MED ORDER — LIDOCAINE HCL (CARDIAC) PF 100 MG/5ML IV SOSY
PREFILLED_SYRINGE | INTRAVENOUS | Status: DC | PRN
  Administered 2019-12-19: 30 mg via INTRAVENOUS

## 2019-12-19 MED ORDER — LACTATED RINGERS IV SOLN: INTRAVENOUS | Status: DC

## 2019-12-19 MED ORDER — OXYCODONE HCL 5 MG PO TABS: 5.0000 mg | ORAL_TABLET | Freq: Once | ORAL | Status: DC | PRN

## 2019-12-19 MED ORDER — OXYCODONE HCL 5 MG PO TABS: 5.0000 mg | ORAL_TABLET | Freq: Four times a day (QID) | ORAL | 0 refills | Status: DC | PRN

## 2019-12-19 MED ORDER — CLONIDINE HCL (ANALGESIA) 100 MCG/ML EP SOLN
EPIDURAL | Status: DC | PRN
  Administered 2019-12-19: 100 ug

## 2019-12-19 MED ORDER — 0.9 % SODIUM CHLORIDE (POUR BTL) OPTIME
TOPICAL | Status: DC | PRN
  Administered 2019-12-19: 1000 mL

## 2019-12-19 MED ORDER — FENTANYL CITRATE (PF) 100 MCG/2ML IJ SOLN
50.0000 ug | INTRAMUSCULAR | Status: DC | PRN
  Administered 2019-12-19: 50 ug via INTRAVENOUS
  Filled 2019-12-19: qty 2

## 2019-12-19 MED ORDER — ONDANSETRON HCL 4 MG/2ML IJ SOLN
INTRAMUSCULAR | Status: DC | PRN
  Administered 2019-12-19: 4 mg via INTRAVENOUS

## 2019-12-19 MED ORDER — ONDANSETRON HCL 4 MG/2ML IJ SOLN
INTRAMUSCULAR | Status: AC
  Filled 2019-12-19: qty 2

## 2019-12-19 MED ORDER — CLINDAMYCIN PHOSPHATE 900 MG/50ML IV SOLN
INTRAVENOUS | Status: AC
  Filled 2019-12-19: qty 50

## 2019-12-19 SURGICAL SUPPLY — 82 items
APL PRP STRL LF DISP 70% ISPRP (MISCELLANEOUS) ×1
BAND INSRT 18 STRL LF DISP RB (MISCELLANEOUS)
BAND RUBBER #18 3X1/16 STRL (MISCELLANEOUS) IMPLANT
BIT DRILL CANN 2.4 (BIT) ×2
BIT DRILL CANN MAX VPC 2.4 (BIT) IMPLANT
BLADE SURG 15 STRL LF DISP TIS (BLADE) ×1 IMPLANT
BLADE SURG 15 STRL SS (BLADE) ×2
BNDG CMPR 75X41 PLY ABS (GAUZE/BANDAGES/DRESSINGS) ×1
BNDG CMPR 9X4 STRL LF SNTH (GAUZE/BANDAGES/DRESSINGS) ×1
BNDG COHESIVE 2X5 TAN STRL LF (GAUZE/BANDAGES/DRESSINGS) ×1 IMPLANT
BNDG COHESIVE 4X5 TAN STRL (GAUZE/BANDAGES/DRESSINGS) ×2 IMPLANT
BNDG COHESIVE 6X5 TAN NS LF (GAUZE/BANDAGES/DRESSINGS) ×1 IMPLANT
BNDG ESMARK 4X9 LF (GAUZE/BANDAGES/DRESSINGS) ×2 IMPLANT
BNDG GAUZE ELAST 4 BULKY (GAUZE/BANDAGES/DRESSINGS) ×4 IMPLANT
BNDG STRETCH 4X75 NS LF (GAUZE/BANDAGES/DRESSINGS) ×1 IMPLANT
BRUSH SCRUB EZ PLAIN DRY (MISCELLANEOUS) IMPLANT
CANISTER SUCT 3000ML PPV (MISCELLANEOUS) ×2 IMPLANT
CHLORAPREP W/TINT 26 (MISCELLANEOUS) ×2 IMPLANT
CORD BIPOLAR FORCEPS 12FT (ELECTRODE) ×2 IMPLANT
COVER BACK TABLE 60X90IN (DRAPES) ×2 IMPLANT
COVER SURGICAL LIGHT HANDLE (MISCELLANEOUS) ×2 IMPLANT
COVER WAND RF STERILE (DRAPES) ×2 IMPLANT
CUFF TOURN SGL QUICK 18X4 (TOURNIQUET CUFF) ×1 IMPLANT
CUFF TOURN SGL QUICK 24 (TOURNIQUET CUFF)
CUFF TRNQT CYL 24X4X16.5-23 (TOURNIQUET CUFF) IMPLANT
DRAPE C-ARM 42X72 X-RAY (DRAPES) ×2 IMPLANT
DRAPE SURG 17X23 STRL (DRAPES) ×2 IMPLANT
DRSG ADAPTIC 3X8 NADH LF (GAUZE/BANDAGES/DRESSINGS) ×2 IMPLANT
DRSG EMULSION OIL 3X3 NADH (GAUZE/BANDAGES/DRESSINGS) IMPLANT
GAUZE SPONGE 4X4 12PLY STRL (GAUZE/BANDAGES/DRESSINGS) ×2 IMPLANT
GLOVE BIO SURGEON STRL SZ7.5 (GLOVE) ×2 IMPLANT
GLOVE BIOGEL PI IND STRL 8 (GLOVE) ×1 IMPLANT
GLOVE BIOGEL PI INDICATOR 8 (GLOVE) ×1
GOWN STRL REUS W/ TWL XL LVL3 (GOWN DISPOSABLE) ×1 IMPLANT
GOWN STRL REUS W/TWL XL LVL3 (GOWN DISPOSABLE) ×2
K-WIRE 1.6 (WIRE) ×8
K-WIRE COCR 0.9X95 (WIRE) ×4
K-WIRE COCR 1.1X105 (WIRE) ×2
K-WIRE FX5X1.6XNS BN SS (WIRE) ×4
KIT BASIN OR (CUSTOM PROCEDURE TRAY) ×2 IMPLANT
KWIRE COCR 0.9X95 (WIRE) IMPLANT
KWIRE COCR 1.1X105 (WIRE) IMPLANT
KWIRE FX5X1.6XNS BN SS (WIRE) IMPLANT
NDL HYPO 25X1 1.5 SAFETY (NEEDLE) IMPLANT
NEEDLE HYPO 22GX1.5 SAFETY (NEEDLE) ×2 IMPLANT
NEEDLE HYPO 25X1 1.5 SAFETY (NEEDLE) IMPLANT
NS IRRIG 1000ML POUR BTL (IV SOLUTION) ×2 IMPLANT
PACK ORTHO EXTREMITY (CUSTOM PROCEDURE TRAY) ×2 IMPLANT
PAD CAST 4YDX4 CTTN HI CHSV (CAST SUPPLIES) ×1 IMPLANT
PADDING CAST ABS 4INX4YD NS (CAST SUPPLIES)
PADDING CAST ABS COTTON 4X4 ST (CAST SUPPLIES) IMPLANT
PADDING CAST COTTON 4X4 STRL (CAST SUPPLIES) ×2
PEG LOCKING SMOOTH 2.2X14 (Peg) ×1 IMPLANT
PEG LOCKING SMOOTH 2.2X16 (Screw) ×2 IMPLANT
PEG LOCKING SMOOTH 2.2X18 (Peg) ×3 IMPLANT
PEG LOCKING SMOOTH 2.2X20 (Screw) ×1 IMPLANT
PENCIL BUTTON HOLSTER BLD 10FT (ELECTRODE) ×1 IMPLANT
PLATE DVR ULNA (Plate) ×2 IMPLANT
PLATE STANDARD DVR LEFT (Plate) ×2 IMPLANT
PLATE STD DVR LT 24X51 (Plate) ×1 IMPLANT
SCREW  LP NL 2.7X11MM (Screw) ×2 IMPLANT
SCREW 2.7X14MM (Screw) ×1 IMPLANT
SCREW BN 14X2.7XNONLOCK 3 LD (Screw) IMPLANT
SCREW CANN MAX VPC 3.4X18 (Screw) ×1 IMPLANT
SCREW LOCK 14X2.7X 3 LD TPR (Screw) IMPLANT
SCREW LOCKING 2.7X11MM (Screw) ×4 IMPLANT
SCREW LOCKING 2.7X13MM (Screw) ×2 IMPLANT
SCREW LOCKING 2.7X14 (Screw) ×8 IMPLANT
SCREW LOCKING 2.7X15MM (Screw) ×1 IMPLANT
SCREW LP NL 2.7X11MM (Screw) IMPLANT
SCREW MULTI DIRECTIONAL 2.7X14 (Screw) ×1 IMPLANT
SCREW MULTI DIRECTIONAL 2.7X20 (Screw) ×1 IMPLANT
SCREW NLOCK 2.7X14 (Screw) ×1 IMPLANT
SCREW VPS 3.4X18MM (Screw) ×2 IMPLANT
SPLINT FIBERGLASS 3X35 (CAST SUPPLIES) ×1 IMPLANT
SUT VIC AB 2-0 CT3 27 (SUTURE) ×2 IMPLANT
SUT VICRYL 4-0 PS2 18IN ABS (SUTURE) IMPLANT
SUT VICRYL RAPIDE 4/0 PS 2 (SUTURE) ×3 IMPLANT
SYR 10ML LL (SYRINGE) IMPLANT
TOWEL GREEN STERILE FF (TOWEL DISPOSABLE) ×2 IMPLANT
TUBE CONNECTING 12X1/4 (SUCTIONS) ×2 IMPLANT
UNDERPAD 30X36 HEAVY ABSORB (UNDERPADS AND DIAPERS) ×2 IMPLANT

## 2019-12-19 NOTE — Transfer of Care (Signed)
Immediate Anesthesia Transfer of Care Note  Patient: Maria Jackson  Procedure(s) Performed: OPEN REDUCTION INTERNAL FIXATION (ORIF) DISTAL RADIAL FRACTURE LEFT (Left Wrist)  Patient Location: PACU  Anesthesia Type:Regional  Level of Consciousness: awake, alert  and oriented  Airway & Oxygen Therapy: Patient Spontanous Breathing  Post-op Assessment: Report given to RN and Post -op Vital signs reviewed and stable  Post vital signs: Reviewed and stable  Last Vitals:  Vitals Value Taken Time  BP 112/63 12/19/19 1956  Temp    Pulse 58 12/19/19 1958  Resp 19 12/19/19 1958  SpO2 100 % 12/19/19 1958  Vitals shown include unvalidated device data.  Last Pain:  Vitals:   12/19/19 1049  TempSrc: Oral  PainSc:          Complications: No complications documented.

## 2019-12-19 NOTE — ED Notes (Signed)
Notified Gabriel Cirri, charge RN @[facility] , of pt arrival via [employer] and to direct pt to short stay.

## 2019-12-19 NOTE — Anesthesia Procedure Notes (Signed)
Anesthesia Regional Block: Supraclavicular block   Pre-Anesthetic Checklist: ,, timeout performed, Correct Patient, Correct Site, Correct Laterality, Correct Procedure, Correct Position, site marked, Risks and benefits discussed, pre-op evaluation,  At surgeon's request and post-op pain management  Laterality: Left  Prep: Maximum Sterile Barrier Precautions used, chloraprep       Needles:  Injection technique: Single-shot  Needle Type: Echogenic Stimulator Needle     Needle Length: 9cm  Needle Gauge: 22     Additional Needles:   Procedures:,,,, ultrasound used (permanent image in chart),,,,  Narrative:  Start time: 12/19/2019 5:03 PM End time: 12/19/2019 5:05 PM Injection made incrementally with aspirations every 5 mL.  Performed by: Personally  Anesthesiologist: Brennan Bailey, MD  Additional Notes: Risks, benefits, and alternative discussed. Patient gave consent for procedure. Patient prepped and draped in sterile fashion. Sedation administered, patient remains easily responsive to voice. Relevant anatomy identified with ultrasound guidance. Local anesthetic given in 5cc increments with no signs or symptoms of intravascular injection. No pain or paraesthesias with injection. Patient monitored throughout procedure with signs of LAST or immediate complications. Tolerated well. Ultrasound image placed in chart.  Tawny Asal, MD

## 2019-12-19 NOTE — ED Notes (Signed)
Placed short arm splint and arm sling on pt.

## 2019-12-19 NOTE — Discharge Instructions (Signed)
Discharge Instructions   You have a dressing with a plaster splint incorporated in it. Move your fingers as much as possible, making a full fist and fully opening the fist. Elevate your hand to reduce pain & swelling of the digits.  Ice over the operative site may be helpful to reduce pain & swelling.  DO NOT USE HEAT. Pain medicine has been prescribed for you.   Use Tylenol regularly, and fill in with additional oxycodone as needed Use your medicine as needed over the first 48 hours, and then you can begin to taper your use.   Leave the dressing in place until you return to our office.  You may shower, but keep the bandage clean & dry.  You may drive a car when you are off of prescription pain medications and can safely control your vehicle with both hands. Our office will call you to arrange follow-up   Please call [phone] during normal business hours or [phone] after hours for any problems. Including the following:  - excessive redness of the incisions - drainage for more than 4 days - fever of more than 101.5 F  *Please note that pain medications will not be refilled after hours or on weekends.
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
## 2019-12-19 NOTE — ED Triage Notes (Signed)
Pt fell 2 hours ago and landed on her left wrist. Per EMS, deformity noted. Pt's VSS.
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
## 2019-12-19 NOTE — Anesthesia Procedure Notes (Signed)
Procedure Name: MAC Date/Time: 12/19/2019 5:40 PM Performed by: Eligha Bridegroom, CRNA Pre-anesthesia Checklist: Patient identified, Emergency Drugs available, Suction available, Patient being monitored and Timeout performed Patient Re-evaluated:Patient Re-evaluated prior to induction Oxygen Delivery Method: Circle system utilized Preoxygenation: Pre-oxygenation with 100% oxygen Induction Type: IV induction

## 2019-12-19 NOTE — Op Note (Signed)
12/19/2019  4:51 PM  PATIENT:  Maria Jackson  74 y.o. female  PRE-OPERATIVE DIAGNOSIS:  1.  Displaced comminuted left intra-articular distal radius fracture      2.  Displaced left distal ulna shaft fracture      3.  Left scaphoid fracture  POST-OPERATIVE DIAGNOSIS:  Same  PROCEDURE:   1.  ORIF left comminuted intra-articular distal radius fracture, 3+ fragments    2.  ORIF left distal ulnar shaft fracture    3.  ORIF left scaphoid fracture  SURGEON: Rayvon Char. Grandville Silos, MD  PHYSICIAN ASSISTANT: Morley Kos, OPA-C  ANESTHESIA:  regional and MAC  SPECIMENS:  None  DRAINS:   None  EBL:  less than 50 mL  PREOPERATIVE INDICATIONS:  Maria Jackson is a  74 y.o. female who experienced a mechanical fall, sustaining a comminuted displaced intra-articular left distal radius fracture, a left distal ulnar shaft fracture, and a left scaphoid fracture.  The risks benefits and alternatives were discussed with the patient preoperatively including but not limited to the risks of infection, bleeding, nerve injury, cardiopulmonary complications, the need for revision surgery, among others, and the patient verbalized understanding and consented to proceed.  OPERATIVE IMPLANTS: Biomet cross lock DVR plate/screws/pegs, distal ulna plate, and VPC  cannulated headless screw  OPERATIVE PROCEDURE:  After receiving prophylactic antibiotics and a regional block, the patient was escorted to the operative theatre and placed in a supine position.  A surgical time-out was performed during which the planned procedure, proposed operative site, and the correct patient identity were compared to the operative consent and agreement confirmed by the circulating nurse according to current facility policy.  Following application of a tourniquet to the operative extremity, the exposed skin was prescrubbed with a Hibiclens scrub brush before being formally prepped with Chloraprep and draped in the usual sterile  fashion.  The limb was exsanguinated with an Esmarch bandage and the tourniquet inflated to approximately 145mHg higher than systolic BP.  A sinusoidal-shaped incision was marked and made over the FCR axis and the distal forearm. The skin was incised sharply with scalpel, subcutaneous tissues with blunt and spreading dissection. The FCR axis was exploited deeply. The pronator quadratus was reflected in an L-shaped ulnarly. The fracture was inspected and provisionally reduced.  This was confirmed fluoroscopically. The appropriately sized plate was selected and found to fit well. It was placed in its provisional alignment of the radius and this was confirmed fluoroscopically.  It was secured to the radius with a screw through the slotted hole.  Additional adjustments were made as necessary, and the distal holes were all drilled and filled after first reducing the distal radius to the plate and securing it with a couple of provisional K wires through the plate..  Peg/screw length distally was selected on the shorter side of measurements to minimize the risk for dorsal cortical penetration. The remainder of the proximal holes were drilled and filled.     Attention was shifted to the ulna where a direct linear longitudinal approach was made over the subcutaneous border of the ulna.  Full-thickness flaps were elevated protecting the dorsal ulnar cutaneous nerve branch in the distal portion of the incision.  Subperiosteal dissection was carried out proximal to the fracture and the fracture was found to be fairly easily mobilized and reduced.  Ultimately was decided to use the Biomet ulnar styloid plate where 3 points of fixation could be placed distally, and 3 proximally.  It was adjusted to the appropriate position,  lying between the FCU and ECU.  It was provisionally secured to the bone as the fracture was reduced and then secured with definitive fixation, mostly with locked screws.  Alignment was judged to be  near-anatomic and the fixation was not intra-articular.  Attention was then shifted to the scaphoid.  A small linear longitudinal incision was made over the scapholunate interval.  The retinaculum was released just enough to spread between the second and third compartment tendons where a small capsulotomy was made with the end of a 15 blade.  A 16-gauge needle was then used to find the starting point in the scaphoid and this was confirmed fluoroscopically.  The guidepin was then placed.  This required a little bit of adjustment to find the appropriate and best positioning within the central axis of the scaphoid.  This was then drilled and filled with an 18 mm length screw.  The screw was examined fluoroscopically after placement and found not to be intra-articular.  In addition visually from the proximal aspect it was judged to be below the surface of the cartilage.  Final images were obtained and the dorsal wound was then washed and closed with 4-0 Vicryl Rapide running horizontal mattress sutures.  The volar and ulnar wounds were similarly washed and the pronator was repaired with 2-0 Vicryl suture.  The tourniquet was released some additional hemostasis obtained and then the skin wounds were closed with 2-0 Vicryl deep dermal buried subcuticular sutures followed by running 4-0 Vicryl repeat horizontal mattress suture at the skin.  A sugar tong splint dressing was applied and she was taken to recovery room in stable condition.  DISPOSITION: The patient will be discharged home today with typical post-op instructions, returning in 10-15 days for reevaluation with new x-rays of the affected wrist out of the splint to include an inclined lateral and a scaphoid view (5 views total) and then transition to therapy to have a custom [place] splint constructed and begin rehabilitation.

## 2019-12-19 NOTE — Anesthesia Preprocedure Evaluation (Addendum)
Anesthesia Evaluation  Patient identified by MRN, date of birth, ID band Patient awake    Reviewed: Allergy & Precautions, NPO status , Patient's Chart, lab work & pertinent test results  History of Anesthesia Complications Negative for: history of anesthetic complications  Airway Mallampati: II  TM Distance: >3 FB Neck ROM: Full    Dental no notable dental hx.    Pulmonary neg pulmonary ROS,    Pulmonary exam normal        Cardiovascular negative cardio ROS Normal cardiovascular exam     Neuro/Psych negative neurological ROS  negative psych ROS   GI/Hepatic Neg liver ROS, PUD, GERD  Medicated,  Endo/Other  negative endocrine ROS  Renal/GU negative Renal ROS  negative genitourinary   Musculoskeletal  (+) Arthritis ,   Abdominal   Peds  Hematology  (+) anemia , Hgb 11.9   Anesthesia Other Findings Hx of breast ca  Reproductive/Obstetrics negative OB ROS                            Anesthesia Physical Anesthesia Plan  ASA: II  Anesthesia Plan: MAC and Regional   Post-op Pain Management:    Induction:   PONV Risk Score and Plan: Treatment may vary due to age or medical condition, Ondansetron and Propofol infusion  Airway Management Planned: Natural Airway and Simple Face Mask  Additional Equipment: None  Intra-op Plan:   Post-operative Plan:   Informed Consent: I have reviewed the patients History and Physical, chart, labs and discussed the procedure including the risks, benefits and alternatives for the proposed anesthesia with the patient or authorized representative who has indicated his/her understanding and acceptance.       Plan Discussed with: CRNA  Anesthesia Plan Comments:        Anesthesia Quick Evaluation

## 2019-12-19 NOTE — Anesthesia Postprocedure Evaluation (Signed)
Anesthesia Post Note  Patient: Maria Jackson  Procedure(s) Performed: OPEN REDUCTION INTERNAL FIXATION (ORIF) DISTAL RADIAL FRACTURE LEFT (Left Wrist)     Patient location during evaluation: PACU Anesthesia Type: Regional Level of consciousness: awake and alert Pain management: pain level controlled Vital Signs Assessment: post-procedure vital signs reviewed and stable Respiratory status: spontaneous breathing, nonlabored ventilation, respiratory function stable and patient connected to nasal cannula oxygen Cardiovascular status: stable and blood pressure returned to baseline Postop Assessment: no apparent nausea or vomiting Anesthetic complications: no   No complications documented.  Last Vitals:  Vitals:   12/19/19 2015 12/19/19 2030  BP: 131/71 131/66  Pulse: (!) 58 (!) 55  Resp: (!) 21 18  Temp:  37.1 C  SpO2: 97% 99%    Last Pain:  Vitals:   12/19/19 2030  TempSrc:   PainSc: 0-No pain                 Jaidah Lomax COKER

## 2019-12-20 ENCOUNTER — Encounter (HOSPITAL_COMMUNITY): Payer: Self-pay | Admitting: Orthopedic Surgery

## 2019-12-24 NOTE — Anesthesia Postprocedure Evaluation (Signed)
Anesthesia Post Note  Patient: Maria Jackson  Procedure(s) Performed: OPEN REDUCTION INTERNAL FIXATION (ORIF) DISTAL RADIAL FRACTURE LEFT (Left Wrist)     Patient location during evaluation: PACU Anesthesia Type: Regional Level of consciousness: awake and alert Pain management: pain level controlled Vital Signs Assessment: post-procedure vital signs reviewed and stable Respiratory status: spontaneous breathing, nonlabored ventilation, respiratory function stable and patient connected to nasal cannula oxygen Cardiovascular status: blood pressure returned to baseline and stable Postop Assessment: no apparent nausea or vomiting Anesthetic complications: no   No complications documented.  Last Vitals:  Vitals:   12/19/19 2015 12/19/19 2030  BP: 131/71 131/66  Pulse: (!) 58 (!) 55  Resp: (!) 21 18  Temp:  37.1 C  SpO2: 97% 99%    Last Pain:  Vitals:   12/19/19 2030  TempSrc:   PainSc: 0-No pain                 Jaia Alonge COKER

## 2019-12-27 DIAGNOSIS — S52502D Unspecified fracture of the lower end of left radius, subsequent encounter for closed fracture with routine healing: Secondary | ICD-10-CM | POA: Diagnosis not present

## 2019-12-27 DIAGNOSIS — S52602D Unspecified fracture of lower end of left ulna, subsequent encounter for closed fracture with routine healing: Secondary | ICD-10-CM | POA: Diagnosis not present

## 2019-12-27 DIAGNOSIS — S52601A Unspecified fracture of lower end of right ulna, initial encounter for closed fracture: Secondary | ICD-10-CM | POA: Diagnosis not present

## 2019-12-27 DIAGNOSIS — S62002D Unspecified fracture of navicular [scaphoid] bone of left wrist, subsequent encounter for fracture with routine healing: Secondary | ICD-10-CM | POA: Diagnosis not present

## 2019-12-27 DIAGNOSIS — S52501A Unspecified fracture of the lower end of right radius, initial encounter for closed fracture: Secondary | ICD-10-CM | POA: Diagnosis not present

## 2019-12-27 DIAGNOSIS — S62021A Displaced fracture of middle third of navicular [scaphoid] bone of right wrist, initial encounter for closed fracture: Secondary | ICD-10-CM | POA: Diagnosis not present

## 2019-12-31 DIAGNOSIS — M25561 Pain in right knee: Secondary | ICD-10-CM | POA: Diagnosis not present

## 2019-12-31 DIAGNOSIS — L03115 Cellulitis of right lower limb: Secondary | ICD-10-CM | POA: Diagnosis not present

## 2019-12-31 DIAGNOSIS — M25469 Effusion, unspecified knee: Secondary | ICD-10-CM | POA: Diagnosis not present

## 2019-12-31 DIAGNOSIS — Z96651 Presence of right artificial knee joint: Secondary | ICD-10-CM | POA: Diagnosis not present

## 2020-01-02 DIAGNOSIS — Z0120 Encounter for dental examination and cleaning without abnormal findings: Secondary | ICD-10-CM | POA: Diagnosis not present

## 2020-01-02 DIAGNOSIS — Z471 Aftercare following joint replacement surgery: Secondary | ICD-10-CM | POA: Diagnosis not present

## 2020-01-02 DIAGNOSIS — Z96651 Presence of right artificial knee joint: Secondary | ICD-10-CM | POA: Diagnosis not present

## 2020-01-02 DIAGNOSIS — S8001XA Contusion of right knee, initial encounter: Secondary | ICD-10-CM | POA: Diagnosis not present

## 2020-01-03 DIAGNOSIS — S62002D Unspecified fracture of navicular [scaphoid] bone of left wrist, subsequent encounter for fracture with routine healing: Secondary | ICD-10-CM | POA: Diagnosis not present

## 2020-01-03 DIAGNOSIS — S52502D Unspecified fracture of the lower end of left radius, subsequent encounter for closed fracture with routine healing: Secondary | ICD-10-CM | POA: Diagnosis not present

## 2020-01-03 DIAGNOSIS — S52602D Unspecified fracture of lower end of left ulna, subsequent encounter for closed fracture with routine healing: Secondary | ICD-10-CM | POA: Diagnosis not present

## 2020-01-10 DIAGNOSIS — S52602D Unspecified fracture of lower end of left ulna, subsequent encounter for closed fracture with routine healing: Secondary | ICD-10-CM | POA: Diagnosis not present

## 2020-01-10 DIAGNOSIS — S62002D Unspecified fracture of navicular [scaphoid] bone of left wrist, subsequent encounter for fracture with routine healing: Secondary | ICD-10-CM | POA: Diagnosis not present

## 2020-01-10 DIAGNOSIS — S52502D Unspecified fracture of the lower end of left radius, subsequent encounter for closed fracture with routine healing: Secondary | ICD-10-CM | POA: Diagnosis not present

## 2020-01-16 DIAGNOSIS — S52502D Unspecified fracture of the lower end of left radius, subsequent encounter for closed fracture with routine healing: Secondary | ICD-10-CM | POA: Diagnosis not present

## 2020-01-16 DIAGNOSIS — S62002D Unspecified fracture of navicular [scaphoid] bone of left wrist, subsequent encounter for fracture with routine healing: Secondary | ICD-10-CM | POA: Diagnosis not present

## 2020-01-16 DIAGNOSIS — S52602D Unspecified fracture of lower end of left ulna, subsequent encounter for closed fracture with routine healing: Secondary | ICD-10-CM | POA: Diagnosis not present

## 2020-01-18 DIAGNOSIS — Z981 Arthrodesis status: Secondary | ICD-10-CM | POA: Diagnosis not present

## 2020-01-21 DIAGNOSIS — J341 Cyst and mucocele of nose and nasal sinus: Secondary | ICD-10-CM | POA: Diagnosis not present

## 2020-01-21 DIAGNOSIS — S00411A Abrasion of right ear, initial encounter: Secondary | ICD-10-CM | POA: Diagnosis not present

## 2020-01-21 DIAGNOSIS — J310 Chronic rhinitis: Secondary | ICD-10-CM | POA: Diagnosis not present

## 2020-01-22 ENCOUNTER — Encounter: Payer: Self-pay | Admitting: Gastroenterology

## 2020-01-23 DIAGNOSIS — S62002D Unspecified fracture of navicular [scaphoid] bone of left wrist, subsequent encounter for fracture with routine healing: Secondary | ICD-10-CM | POA: Diagnosis not present

## 2020-01-23 DIAGNOSIS — S52602D Unspecified fracture of lower end of left ulna, subsequent encounter for closed fracture with routine healing: Secondary | ICD-10-CM | POA: Diagnosis not present

## 2020-01-23 DIAGNOSIS — S52502D Unspecified fracture of the lower end of left radius, subsequent encounter for closed fracture with routine healing: Secondary | ICD-10-CM | POA: Diagnosis not present

## 2020-02-04 DIAGNOSIS — S52601D Unspecified fracture of lower end of right ulna, subsequent encounter for closed fracture with routine healing: Secondary | ICD-10-CM | POA: Diagnosis not present

## 2020-02-04 DIAGNOSIS — S52502D Unspecified fracture of the lower end of left radius, subsequent encounter for closed fracture with routine healing: Secondary | ICD-10-CM | POA: Diagnosis not present

## 2020-02-04 DIAGNOSIS — S52501D Unspecified fracture of the lower end of right radius, subsequent encounter for closed fracture with routine healing: Secondary | ICD-10-CM | POA: Diagnosis not present

## 2020-02-05 DIAGNOSIS — S52602D Unspecified fracture of lower end of left ulna, subsequent encounter for closed fracture with routine healing: Secondary | ICD-10-CM | POA: Diagnosis not present

## 2020-02-05 DIAGNOSIS — S62002D Unspecified fracture of navicular [scaphoid] bone of left wrist, subsequent encounter for fracture with routine healing: Secondary | ICD-10-CM | POA: Diagnosis not present

## 2020-02-05 DIAGNOSIS — S52502D Unspecified fracture of the lower end of left radius, subsequent encounter for closed fracture with routine healing: Secondary | ICD-10-CM | POA: Diagnosis not present

## 2020-02-05 DIAGNOSIS — S52501D Unspecified fracture of the lower end of right radius, subsequent encounter for closed fracture with routine healing: Secondary | ICD-10-CM | POA: Diagnosis not present

## 2020-02-13 DIAGNOSIS — M810 Age-related osteoporosis without current pathological fracture: Secondary | ICD-10-CM | POA: Diagnosis not present

## 2020-02-21 DIAGNOSIS — M810 Age-related osteoporosis without current pathological fracture: Secondary | ICD-10-CM | POA: Diagnosis not present

## 2020-03-05 DIAGNOSIS — J310 Chronic rhinitis: Secondary | ICD-10-CM | POA: Diagnosis not present

## 2020-03-05 DIAGNOSIS — J341 Cyst and mucocele of nose and nasal sinus: Secondary | ICD-10-CM | POA: Diagnosis not present

## 2020-03-05 DIAGNOSIS — S00411A Abrasion of right ear, initial encounter: Secondary | ICD-10-CM | POA: Diagnosis not present

## 2020-03-06 DIAGNOSIS — S52502D Unspecified fracture of the lower end of left radius, subsequent encounter for closed fracture with routine healing: Secondary | ICD-10-CM | POA: Diagnosis not present

## 2020-03-12 ENCOUNTER — Ambulatory Visit (AMBULATORY_SURGERY_CENTER): Payer: Self-pay | Admitting: *Deleted

## 2020-03-12 ENCOUNTER — Encounter: Payer: Self-pay | Admitting: Gastroenterology

## 2020-03-12 ENCOUNTER — Other Ambulatory Visit: Payer: Self-pay

## 2020-03-12 VITALS — Ht 62.0 in | Wt 159.0 lb

## 2020-03-12 DIAGNOSIS — Z86010 Personal history of colonic polyps: Secondary | ICD-10-CM

## 2020-03-12 MED ORDER — SUTAB 1479-225-188 MG PO TABS: 1.0000 | ORAL_TABLET | ORAL | 0 refills | Status: DC

## 2020-03-12 NOTE — Progress Notes (Signed)
Patient is here in-person for PV. Patient denies any allergies to eggs or soy. Patient denies any problems with anesthesia/sedation. Patient denies any oxygen use at home. Patient denies taking any diet/weight loss medications or blood thinners. Patient is not being treated for MRSA or C-diff. Patient is aware of our care-partner policy and NTIRW-43 safety protocol. EMMI education sent to [employer].Marland Kitchen   COVID-19  Vaccines completed per pt. Prep Prescription coupon given to the patient.

## 2020-03-23 ENCOUNTER — Encounter: Payer: Self-pay | Admitting: Certified Registered Nurse Anesthetist

## 2020-03-24 ENCOUNTER — Ambulatory Visit (AMBULATORY_SURGERY_CENTER): Payer: Medicare Other | Admitting: Gastroenterology

## 2020-03-24 ENCOUNTER — Encounter: Payer: Self-pay | Admitting: Gastroenterology

## 2020-03-24 ENCOUNTER — Other Ambulatory Visit: Payer: Self-pay

## 2020-03-24 VITALS — BP 181/75 | HR 62 | Temp 96.8°F | Resp 16 | Ht 62.0 in | Wt 159.0 lb

## 2020-03-24 DIAGNOSIS — D122 Benign neoplasm of ascending colon: Secondary | ICD-10-CM | POA: Diagnosis not present

## 2020-03-24 DIAGNOSIS — D120 Benign neoplasm of cecum: Secondary | ICD-10-CM | POA: Diagnosis not present

## 2020-03-24 DIAGNOSIS — Z86010 Personal history of colonic polyps: Secondary | ICD-10-CM | POA: Diagnosis not present

## 2020-03-24 DIAGNOSIS — D123 Benign neoplasm of transverse colon: Secondary | ICD-10-CM

## 2020-03-24 MED ORDER — SODIUM CHLORIDE 0.9 % IV SOLN: 500.0000 mL | INTRAVENOUS | Status: DC

## 2020-03-24 NOTE — Progress Notes (Signed)
Report given to PACU, vss 

## 2020-03-24 NOTE — Patient Instructions (Signed)
Handouts provided on polyps and diverticulosis.  ? ?YOU HAD AN ENDOSCOPIC PROCEDURE TODAY AT THE [facility]:   Refer to the procedure report that was given to you for any specific questions about what was found during the examination.  If the procedure report does not answer your questions, please call your gastroenterologist to clarify.  If you requested that your care partner not be given the details of your procedure findings, then the procedure report has been included in a sealed envelope for you to review at your convenience later. ? ?YOU SHOULD EXPECT: Some feelings of bloating in the abdomen. Passage of more gas than usual.  Walking can help get rid of the air that was put into your GI tract during the procedure and reduce the bloating. If you had a lower endoscopy (such as a colonoscopy or flexible sigmoidoscopy) you may notice spotting of blood in your stool or on the toilet paper. If you underwent a bowel prep for your procedure, you may not have a normal bowel movement for a few days. ? ?Please Note:  You might notice some irritation and congestion in your nose or some drainage.  This is from the oxygen used during your procedure.  There is no need for concern and it should clear up in a day or so. ? ?SYMPTOMS TO REPORT IMMEDIATELY: ? ?Following lower endoscopy (colonoscopy or flexible sigmoidoscopy): ? Excessive amounts of blood in the stool ? Significant tenderness or worsening of abdominal pains ? Swelling of the abdomen that is new, acute ? Fever of 100?F or higher ? ?For urgent or emergent issues, a gastroenterologist can be reached at any hour by calling [phone]. ?Do not use MyChart messaging for urgent concerns.  ? ? ?DIET:  We do recommend a small meal at first, but then you may proceed to your regular diet.  Drink plenty of fluids but you should avoid alcoholic beverages for 24 hours. ? ?ACTIVITY:  You should plan to take it easy for the rest of today and you should NOT  DRIVE or use heavy machinery until tomorrow (because of the sedation medicines used during the test).   ? ?FOLLOW UP: ?Our staff will call the number listed on your records 48-72 hours following your procedure to check on you and address any questions or concerns that you may have regarding the information given to you following your procedure. If we do not reach you, we will leave a message.  We will attempt to reach you two times.  During this call, we will ask if you have developed any symptoms of COVID 19. If you develop any symptoms (ie: fever, flu-like symptoms, shortness of breath, cough etc.) before then, please call [phone].  If you test positive for Covid 19 in the 2 weeks post procedure, please call and report this information to us.   ? ?If any biopsies were taken you will be contacted by phone or by letter within the next 1-3 weeks.  Please call us at [phone] if you have not heard about the biopsies in 3 weeks.  ? ? ?SIGNATURES/CONFIDENTIALITY: ?You and/or your care partner have signed paperwork which will be entered into your electronic medical record.  These signatures attest to the fact that that the information above on your After Visit Summary has been reviewed and is understood.  Full responsibility of the confidentiality of this discharge information lies with you and/or your care-partner. ? ?

## 2020-03-24 NOTE — Progress Notes (Signed)
Called to room to assist during endoscopic procedure.  Patient ID and intended procedure confirmed with present staff. Received instructions for my participation in the procedure from the performing physician.  

## 2020-03-24 NOTE — Op Note (Signed)
[place] Patient Name: Maria Jackson Procedure Date: 03/24/2020 11:44 AM MRN: [id number] Endoscopist: Remo Lipps P. Havery Moros , MD Age: 74 Referring MD:  Date of Birth: [birth year] Gender: Female Account #: [id number] Procedure:                Colonoscopy Indications:              High risk colon cancer surveillance: Personal                            history of colonic polyps (multiple polyps                            including 46mm SSP removed in 2018) Medicines:                Monitored Anesthesia Care Procedure:                Pre-Anesthesia Assessment:                           - Prior to the procedure, a History and Physical                            was performed, and patient medications and                            allergies were reviewed. The patient's tolerance of                            previous anesthesia was also reviewed. The risks                            and benefits of the procedure and the sedation                            options and risks were discussed with the patient.                            All questions were answered, and informed consent                            was obtained. Prior Anticoagulants: The patient has                            taken no previous anticoagulant or antiplatelet                            agents. ASA Grade Assessment: II - A patient with                            mild systemic disease. After reviewing the risks                            and benefits, the patient was deemed in  satisfactory condition to undergo the procedure.                           After obtaining informed consent, the colonoscope                            was passed under direct vision. Throughout the                            procedure, the patient's blood pressure, pulse, and                            oxygen saturations were monitored continuously. The                            Colonoscope was introduced  through the anus and                            advanced to the the cecum, identified by                            appendiceal orifice and ileocecal valve. The                            colonoscopy was performed without difficulty. The                            patient tolerated the procedure well. The quality                            of the bowel preparation was adequate. The                            ileocecal valve, appendiceal orifice, and rectum                            were photographed. Scope In: 11:52:02 AM Scope Out: 12:24:10 PM Scope Withdrawal Time: 0 hours 22 minutes 35 seconds  Total Procedure Duration: 0 hours 32 minutes 8 seconds  Findings:                 The perianal and digital rectal examinations were                            normal.                           The colon was quite tortuous making cecal                            intubation challenging. Prep required some lavage                            but adequate views obtained.  A 4 mm polyp was found in the cecum. The polyp was                            sessile. The polyp was removed with a cold snare.                            Resection and retrieval were complete.                           Two sessile polyps were found in the ascending                            colon. The polyps were 2 to 3 mm in size. These                            polyps were removed with a cold snare. Resection                            and retrieval were complete.                           Two sessile polyps were found in the transverse                            colon. The polyps were 3 mm in size. These polyps                            were removed with a cold snare. Resection and                            retrieval were complete.                           Multiple small-mouthed diverticula were found in                            the left colon.                           The exam was otherwise  without abnormality. Complications:            No immediate complications. Estimated blood loss:                            Minimal. Estimated Blood Loss:     Estimated blood loss was minimal. Impression:               - Tortuous colon.                           - One 4 mm polyp in the cecum, removed with a cold                            snare. Resected and retrieved.                           -  Two 2 to 3 mm polyps in the ascending colon,                            removed with a cold snare. Resected and retrieved.                           - Two 3 mm polyps in the transverse colon, removed                            with a cold snare. Resected and retrieved.                           - Diverticulosis in the left colon.                           - The examination was otherwise normal. Recommendation:           - Patient has a contact number available for                            emergencies. The signs and symptoms of potential                            delayed complications were discussed with the                            patient. Return to normal activities tomorrow.                            Written discharge instructions were provided to the                            patient.                           - Resume previous diet.                           - Continue present medications.                           - Await pathology results. Remo Lipps P. Havery Moros, MD 03/24/2020 12:29:43 PM This report has been signed electronically.

## 2020-03-24 NOTE — Progress Notes (Signed)
Vs AG Pt's states no medical or surgical changes since previsit or office visit.

## 2020-03-26 ENCOUNTER — Telehealth: Payer: Self-pay | Admitting: *Deleted

## 2020-03-26 NOTE — Telephone Encounter (Signed)
  Follow up Call-  Call back number 03/24/2020  Post procedure Call Back phone  # [phone]  Permission to leave phone message Yes  Some recent data might be hidden     Patient questions:  Do you have a fever, pain , or abdominal swelling? No. Pain Score  0 *  Have you tolerated food without any problems? Yes.    Have you been able to return to your normal activities? Yes.    Do you have any questions about your discharge instructions: Diet   No. Medications  No. Follow up visit  No.  Do you have questions or concerns about your Care? Yes.    Actions: * If pain score is 4 or above: No action needed, pain <4.  1. Have you developed a fever since your procedure? no  2.   Have you had an respiratory symptoms (SOB or cough) since your procedure? no  3.   Have you tested positive for COVID 19 since your procedure no  4.   Have you had any family members/close contacts diagnosed with the COVID 19 since your procedure? no   If yes to any of these questions please route to Joylene John, RN and Joella Prince, RN

## 2020-04-08 DIAGNOSIS — S52501D Unspecified fracture of the lower end of right radius, subsequent encounter for closed fracture with routine healing: Secondary | ICD-10-CM | POA: Diagnosis not present

## 2020-04-08 DIAGNOSIS — S62021D Displaced fracture of middle third of navicular [scaphoid] bone of right wrist, subsequent encounter for fracture with routine healing: Secondary | ICD-10-CM | POA: Diagnosis not present

## 2020-04-08 DIAGNOSIS — S52601D Unspecified fracture of lower end of right ulna, subsequent encounter for closed fracture with routine healing: Secondary | ICD-10-CM | POA: Diagnosis not present

## 2020-04-09 DIAGNOSIS — L821 Other seborrheic keratosis: Secondary | ICD-10-CM | POA: Diagnosis not present

## 2020-04-09 DIAGNOSIS — L918 Other hypertrophic disorders of the skin: Secondary | ICD-10-CM | POA: Diagnosis not present

## 2020-04-09 DIAGNOSIS — L814 Other melanin hyperpigmentation: Secondary | ICD-10-CM | POA: Diagnosis not present

## 2020-04-09 DIAGNOSIS — Z85828 Personal history of other malignant neoplasm of skin: Secondary | ICD-10-CM | POA: Diagnosis not present

## 2020-04-17 DIAGNOSIS — D649 Anemia, unspecified: Secondary | ICD-10-CM | POA: Diagnosis not present

## 2020-04-17 DIAGNOSIS — R7989 Other specified abnormal findings of blood chemistry: Secondary | ICD-10-CM | POA: Diagnosis not present

## 2020-04-24 DIAGNOSIS — R82998 Other abnormal findings in urine: Secondary | ICD-10-CM | POA: Diagnosis not present

## 2020-05-13 DIAGNOSIS — S00411A Abrasion of right ear, initial encounter: Secondary | ICD-10-CM | POA: Diagnosis not present

## 2020-05-16 ENCOUNTER — Other Ambulatory Visit: Payer: Self-pay | Admitting: Internal Medicine

## 2020-05-16 DIAGNOSIS — M810 Age-related osteoporosis without current pathological fracture: Secondary | ICD-10-CM

## 2020-05-20 DIAGNOSIS — S52292D Other fracture of shaft of left ulna, subsequent encounter for closed fracture with routine healing: Secondary | ICD-10-CM | POA: Diagnosis not present

## 2020-05-20 DIAGNOSIS — M1812 Unilateral primary osteoarthritis of first carpometacarpal joint, left hand: Secondary | ICD-10-CM | POA: Diagnosis not present

## 2020-05-20 DIAGNOSIS — S52572D Other intraarticular fracture of lower end of left radius, subsequent encounter for closed fracture with routine healing: Secondary | ICD-10-CM | POA: Diagnosis not present

## 2020-05-20 DIAGNOSIS — S62002D Unspecified fracture of navicular [scaphoid] bone of left wrist, subsequent encounter for fracture with routine healing: Secondary | ICD-10-CM | POA: Diagnosis not present

## 2020-06-17 DIAGNOSIS — J342 Deviated nasal septum: Secondary | ICD-10-CM | POA: Insufficient documentation

## 2020-06-17 DIAGNOSIS — J310 Chronic rhinitis: Secondary | ICD-10-CM | POA: Diagnosis not present

## 2020-06-17 DIAGNOSIS — S00411A Abrasion of right ear, initial encounter: Secondary | ICD-10-CM | POA: Diagnosis not present

## 2020-06-17 DIAGNOSIS — M1812 Unilateral primary osteoarthritis of first carpometacarpal joint, left hand: Secondary | ICD-10-CM | POA: Diagnosis not present

## 2020-06-18 DIAGNOSIS — H2511 Age-related nuclear cataract, right eye: Secondary | ICD-10-CM | POA: Diagnosis not present

## 2020-07-11 DIAGNOSIS — M1812 Unilateral primary osteoarthritis of first carpometacarpal joint, left hand: Secondary | ICD-10-CM | POA: Diagnosis not present

## 2020-07-11 DIAGNOSIS — M25332 Other instability, left wrist: Secondary | ICD-10-CM | POA: Diagnosis not present

## 2020-07-11 DIAGNOSIS — M19042 Primary osteoarthritis, left hand: Secondary | ICD-10-CM | POA: Diagnosis not present

## 2020-07-11 DIAGNOSIS — G8918 Other acute postprocedural pain: Secondary | ICD-10-CM | POA: Diagnosis not present

## 2020-07-11 DIAGNOSIS — M25342 Other instability, left hand: Secondary | ICD-10-CM | POA: Diagnosis not present

## 2020-07-24 DIAGNOSIS — M1812 Unilateral primary osteoarthritis of first carpometacarpal joint, left hand: Secondary | ICD-10-CM | POA: Diagnosis not present

## 2020-07-28 DIAGNOSIS — M5459 Other low back pain: Secondary | ICD-10-CM | POA: Diagnosis not present

## 2020-07-28 DIAGNOSIS — M1812 Unilateral primary osteoarthritis of first carpometacarpal joint, left hand: Secondary | ICD-10-CM | POA: Diagnosis not present

## 2020-08-04 DIAGNOSIS — H268 Other specified cataract: Secondary | ICD-10-CM | POA: Diagnosis not present

## 2020-08-04 DIAGNOSIS — H2511 Age-related nuclear cataract, right eye: Secondary | ICD-10-CM | POA: Diagnosis not present

## 2020-08-06 DIAGNOSIS — M533 Sacrococcygeal disorders, not elsewhere classified: Secondary | ICD-10-CM | POA: Diagnosis not present

## 2020-08-07 DIAGNOSIS — M1812 Unilateral primary osteoarthritis of first carpometacarpal joint, left hand: Secondary | ICD-10-CM | POA: Diagnosis not present

## 2020-08-12 DIAGNOSIS — M1812 Unilateral primary osteoarthritis of first carpometacarpal joint, left hand: Secondary | ICD-10-CM | POA: Diagnosis not present

## 2020-08-19 DIAGNOSIS — M1812 Unilateral primary osteoarthritis of first carpometacarpal joint, left hand: Secondary | ICD-10-CM | POA: Diagnosis not present

## 2020-08-19 DIAGNOSIS — M810 Age-related osteoporosis without current pathological fracture: Secondary | ICD-10-CM | POA: Diagnosis not present

## 2020-08-26 DIAGNOSIS — M810 Age-related osteoporosis without current pathological fracture: Secondary | ICD-10-CM | POA: Diagnosis not present

## 2020-08-28 DIAGNOSIS — M1812 Unilateral primary osteoarthritis of first carpometacarpal joint, left hand: Secondary | ICD-10-CM | POA: Diagnosis not present

## 2020-09-03 DIAGNOSIS — Z853 Personal history of malignant neoplasm of breast: Secondary | ICD-10-CM | POA: Diagnosis not present

## 2020-09-03 DIAGNOSIS — M85852 Other specified disorders of bone density and structure, left thigh: Secondary | ICD-10-CM | POA: Diagnosis not present

## 2020-09-03 DIAGNOSIS — M1812 Unilateral primary osteoarthritis of first carpometacarpal joint, left hand: Secondary | ICD-10-CM | POA: Diagnosis not present

## 2020-09-03 DIAGNOSIS — Z780 Asymptomatic menopausal state: Secondary | ICD-10-CM | POA: Diagnosis not present

## 2020-09-03 DIAGNOSIS — M810 Age-related osteoporosis without current pathological fracture: Secondary | ICD-10-CM | POA: Diagnosis not present

## 2020-09-03 DIAGNOSIS — M85851 Other specified disorders of bone density and structure, right thigh: Secondary | ICD-10-CM | POA: Diagnosis not present

## 2020-09-04 DIAGNOSIS — M533 Sacrococcygeal disorders, not elsewhere classified: Secondary | ICD-10-CM | POA: Diagnosis not present

## 2020-09-04 DIAGNOSIS — Z981 Arthrodesis status: Secondary | ICD-10-CM | POA: Diagnosis not present

## 2020-09-10 ENCOUNTER — Ambulatory Visit: Payer: Medicare Other | Admitting: Physical Therapy

## 2020-09-10 DIAGNOSIS — M1812 Unilateral primary osteoarthritis of first carpometacarpal joint, left hand: Secondary | ICD-10-CM | POA: Diagnosis not present

## 2020-09-11 ENCOUNTER — Other Ambulatory Visit: Payer: Self-pay

## 2020-09-11 ENCOUNTER — Encounter: Payer: Self-pay | Admitting: Physical Therapy

## 2020-09-11 ENCOUNTER — Ambulatory Visit: Payer: Medicare Other | Attending: Orthopedic Surgery | Admitting: Physical Therapy

## 2020-09-11 DIAGNOSIS — G8929 Other chronic pain: Secondary | ICD-10-CM

## 2020-09-11 DIAGNOSIS — M533 Sacrococcygeal disorders, not elsewhere classified: Secondary | ICD-10-CM | POA: Insufficient documentation

## 2020-09-11 DIAGNOSIS — Z9181 History of falling: Secondary | ICD-10-CM | POA: Diagnosis not present

## 2020-09-11 DIAGNOSIS — M545 Low back pain, unspecified: Secondary | ICD-10-CM | POA: Insufficient documentation
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
## 2020-09-16 ENCOUNTER — Other Ambulatory Visit: Payer: Self-pay

## 2020-09-16 ENCOUNTER — Ambulatory Visit: Payer: Medicare Other | Admitting: *Deleted

## 2020-09-16 DIAGNOSIS — M545 Low back pain, unspecified: Secondary | ICD-10-CM | POA: Diagnosis not present

## 2020-09-16 DIAGNOSIS — Z9181 History of falling: Secondary | ICD-10-CM | POA: Diagnosis not present

## 2020-09-16 DIAGNOSIS — G8929 Other chronic pain: Secondary | ICD-10-CM

## 2020-09-16 DIAGNOSIS — M533 Sacrococcygeal disorders, not elsewhere classified: Secondary | ICD-10-CM | POA: Diagnosis not present
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
## 2020-09-17 DIAGNOSIS — M1812 Unilateral primary osteoarthritis of first carpometacarpal joint, left hand: Secondary | ICD-10-CM | POA: Diagnosis not present

## 2020-09-23 ENCOUNTER — Ambulatory Visit: Payer: Medicare Other | Admitting: *Deleted

## 2020-09-23 ENCOUNTER — Other Ambulatory Visit: Payer: Self-pay

## 2020-09-23 DIAGNOSIS — G8929 Other chronic pain: Secondary | ICD-10-CM | POA: Diagnosis not present

## 2020-09-23 DIAGNOSIS — M533 Sacrococcygeal disorders, not elsewhere classified: Secondary | ICD-10-CM | POA: Diagnosis not present

## 2020-09-23 DIAGNOSIS — M545 Low back pain, unspecified: Secondary | ICD-10-CM | POA: Diagnosis not present

## 2020-09-23 DIAGNOSIS — Z9181 History of falling: Secondary | ICD-10-CM | POA: Diagnosis not present
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
## 2020-09-24 DIAGNOSIS — M1812 Unilateral primary osteoarthritis of first carpometacarpal joint, left hand: Secondary | ICD-10-CM | POA: Diagnosis not present

## 2020-09-30 ENCOUNTER — Ambulatory Visit: Payer: Medicare Other | Admitting: *Deleted

## 2020-09-30 ENCOUNTER — Other Ambulatory Visit: Payer: Self-pay

## 2020-09-30 NOTE — Therapy (Signed)
[facility] [place], [place], [address] Phone: [phone]   Fax:  [phone]  Patient Details  Name: Maria Jackson MRN: [id number] Date of Birth: [birth year] Referring Provider:  Ginger Organ., MD  Encounter Date: 09/30/2020   Tasha Jindra,CHRIS, PTA 09/30/2020, 11:44 AM  [facility] [address] [place], [place], [address] Phone: [phone]   Fax:  [phone]

## 2020-10-01 DIAGNOSIS — M1812 Unilateral primary osteoarthritis of first carpometacarpal joint, left hand: Secondary | ICD-10-CM | POA: Diagnosis not present

## 2020-10-07 ENCOUNTER — Other Ambulatory Visit: Payer: Self-pay

## 2020-10-07 DIAGNOSIS — C50311 Malignant neoplasm of lower-inner quadrant of right female breast: Secondary | ICD-10-CM

## 2020-10-07 DIAGNOSIS — Z170 Estrogen receptor positive status [ER+]: Secondary | ICD-10-CM

## 2020-10-08 DIAGNOSIS — M1812 Unilateral primary osteoarthritis of first carpometacarpal joint, left hand: Secondary | ICD-10-CM | POA: Diagnosis not present
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
## 2020-10-09 ENCOUNTER — Other Ambulatory Visit: Payer: Self-pay

## 2020-10-09 ENCOUNTER — Other Ambulatory Visit: Payer: Self-pay | Admitting: Oncology

## 2020-10-09 ENCOUNTER — Inpatient Hospital Stay: Payer: Medicare Other | Attending: Oncology

## 2020-10-09 ENCOUNTER — Inpatient Hospital Stay: Payer: Medicare Other | Admitting: Oncology

## 2020-10-09 VITALS — BP 144/58 | HR 74 | Temp 97.6°F | Resp 17 | Ht 62.0 in | Wt 163.2 lb

## 2020-10-09 DIAGNOSIS — Z882 Allergy status to sulfonamides status: Secondary | ICD-10-CM | POA: Diagnosis not present

## 2020-10-09 DIAGNOSIS — Z833 Family history of diabetes mellitus: Secondary | ICD-10-CM | POA: Insufficient documentation

## 2020-10-09 DIAGNOSIS — Z881 Allergy status to other antibiotic agents status: Secondary | ICD-10-CM | POA: Diagnosis not present

## 2020-10-09 DIAGNOSIS — M810 Age-related osteoporosis without current pathological fracture: Secondary | ICD-10-CM | POA: Insufficient documentation

## 2020-10-09 DIAGNOSIS — Z886 Allergy status to analgesic agent status: Secondary | ICD-10-CM | POA: Insufficient documentation

## 2020-10-09 DIAGNOSIS — Z86018 Personal history of other benign neoplasm: Secondary | ICD-10-CM | POA: Diagnosis not present

## 2020-10-09 DIAGNOSIS — Z79810 Long term (current) use of selective estrogen receptor modulators (SERMs): Secondary | ICD-10-CM | POA: Diagnosis not present

## 2020-10-09 DIAGNOSIS — Z91030 Bee allergy status: Secondary | ICD-10-CM | POA: Diagnosis not present

## 2020-10-09 DIAGNOSIS — Z7289 Other problems related to lifestyle: Secondary | ICD-10-CM | POA: Insufficient documentation

## 2020-10-09 DIAGNOSIS — Z90722 Acquired absence of ovaries, bilateral: Secondary | ICD-10-CM | POA: Insufficient documentation

## 2020-10-09 DIAGNOSIS — Z880 Allergy status to penicillin: Secondary | ICD-10-CM | POA: Insufficient documentation

## 2020-10-09 DIAGNOSIS — Z170 Estrogen receptor positive status [ER+]: Secondary | ICD-10-CM | POA: Diagnosis not present

## 2020-10-09 DIAGNOSIS — C50311 Malignant neoplasm of lower-inner quadrant of right female breast: Secondary | ICD-10-CM | POA: Diagnosis not present

## 2020-10-09 DIAGNOSIS — Z8249 Family history of ischemic heart disease and other diseases of the circulatory system: Secondary | ICD-10-CM | POA: Diagnosis not present

## 2020-10-09 DIAGNOSIS — Z79899 Other long term (current) drug therapy: Secondary | ICD-10-CM | POA: Insufficient documentation

## 2020-10-09 DIAGNOSIS — Z9049 Acquired absence of other specified parts of digestive tract: Secondary | ICD-10-CM | POA: Diagnosis not present

## 2020-10-09 LAB — CBC WITH DIFFERENTIAL (CANCER CENTER ONLY)
Abs Immature Granulocytes: 0.01 10*3/uL (ref 0.00–0.07)
Basophils Absolute: 0 10*3/uL (ref 0.0–0.1)
Basophils Relative: 1 %
Eosinophils Absolute: 0.1 10*3/uL (ref 0.0–0.5)
Eosinophils Relative: 2 %
HCT: 35.2 % — ABNORMAL LOW (ref 36.0–46.0)
Hemoglobin: 12.2 g/dL (ref 12.0–15.0)
Immature Granulocytes: 0 %
Lymphocytes Relative: 19 %
Lymphs Abs: 0.9 10*3/uL (ref 0.7–4.0)
MCH: 35.2 pg — ABNORMAL HIGH (ref 26.0–34.0)
MCHC: 34.7 g/dL (ref 30.0–36.0)
MCV: 101.4 fL — ABNORMAL HIGH (ref 80.0–100.0)
Monocytes Absolute: 0.9 10*3/uL (ref 0.1–1.0)
Monocytes Relative: 19 %
Neutro Abs: 2.7 10*3/uL (ref 1.7–7.7)
Neutrophils Relative %: 59 %
Platelet Count: 310 10*3/uL (ref 150–400)
RBC: 3.47 MIL/uL — ABNORMAL LOW (ref 3.87–5.11)
RDW: 13.4 % (ref 11.5–15.5)
WBC Count: 4.5 10*3/uL (ref 4.0–10.5)
nRBC: 0 % (ref 0.0–0.2)

## 2020-10-09 LAB — CMP (CANCER CENTER ONLY)
ALT: 17 U/L (ref 0–44)
AST: 23 U/L (ref 15–41)
Albumin: 3.7 g/dL (ref 3.5–5.0)
Alkaline Phosphatase: 36 U/L — ABNORMAL LOW (ref 38–126)
Anion gap: 10 (ref 5–15)
BUN: 16 mg/dL (ref 8–23)
CO2: 28 mmol/L (ref 22–32)
Calcium: 8.6 mg/dL — ABNORMAL LOW (ref 8.9–10.3)
Chloride: 102 mmol/L (ref 98–111)
Creatinine: 0.73 mg/dL (ref 0.44–1.00)
GFR, Estimated: >60 mL/min (ref >60)
Glucose, Bld: 84 mg/dL (ref 70–99)
Potassium: 4.4 mmol/L (ref 3.5–5.1)
Sodium: 140 mmol/L (ref 135–145)
Total Bilirubin: 0.6 mg/dL (ref 0.3–1.2)
Total Protein: 6.5 g/dL (ref 6.5–8.1)

## 2020-10-14 DIAGNOSIS — M1812 Unilateral primary osteoarthritis of first carpometacarpal joint, left hand: Secondary | ICD-10-CM | POA: Diagnosis not present

## 2020-10-15 DIAGNOSIS — M1812 Unilateral primary osteoarthritis of first carpometacarpal joint, left hand: Secondary | ICD-10-CM | POA: Diagnosis not present

## 2020-10-20 ENCOUNTER — Telehealth: Payer: Self-pay | Admitting: Oncology

## 2020-10-20 NOTE — Telephone Encounter (Signed)
Scheduled per 4/07 LOS, Patient notified and confirmed appointment.

## 2020-10-22 DIAGNOSIS — M533 Sacrococcygeal disorders, not elsewhere classified: Secondary | ICD-10-CM | POA: Diagnosis not present

## 2020-10-28 DIAGNOSIS — M1812 Unilateral primary osteoarthritis of first carpometacarpal joint, left hand: Secondary | ICD-10-CM | POA: Diagnosis not present

## 2020-11-05 DIAGNOSIS — M533 Sacrococcygeal disorders, not elsewhere classified: Secondary | ICD-10-CM | POA: Diagnosis not present

## 2020-11-11 ENCOUNTER — Encounter: Payer: Self-pay | Admitting: Oncology

## 2020-11-27 DIAGNOSIS — M1812 Unilateral primary osteoarthritis of first carpometacarpal joint, left hand: Secondary | ICD-10-CM | POA: Diagnosis not present

## 2020-12-12 DIAGNOSIS — M5459 Other low back pain: Secondary | ICD-10-CM | POA: Diagnosis not present

## 2020-12-14 ENCOUNTER — Other Ambulatory Visit: Payer: Self-pay | Admitting: Oncology

## 2021-02-03 DIAGNOSIS — M1812 Unilateral primary osteoarthritis of first carpometacarpal joint, left hand: Secondary | ICD-10-CM | POA: Diagnosis not present

## 2021-02-18 DIAGNOSIS — M810 Age-related osteoporosis without current pathological fracture: Secondary | ICD-10-CM | POA: Diagnosis not present

## 2021-02-24 DIAGNOSIS — M810 Age-related osteoporosis without current pathological fracture: Secondary | ICD-10-CM | POA: Diagnosis not present

## 2021-03-26 DIAGNOSIS — M7521 Bicipital tendinitis, right shoulder: Secondary | ICD-10-CM | POA: Diagnosis not present

## 2021-03-26 DIAGNOSIS — S63619A Unspecified sprain of unspecified finger, initial encounter: Secondary | ICD-10-CM | POA: Diagnosis not present

## 2021-04-03 DIAGNOSIS — M5416 Radiculopathy, lumbar region: Secondary | ICD-10-CM | POA: Diagnosis not present

## 2021-04-03 DIAGNOSIS — M810 Age-related osteoporosis without current pathological fracture: Secondary | ICD-10-CM | POA: Diagnosis not present

## 2021-04-03 DIAGNOSIS — M179 Osteoarthritis of knee, unspecified: Secondary | ICD-10-CM | POA: Diagnosis not present

## 2021-04-13 DIAGNOSIS — J189 Pneumonia, unspecified organism: Secondary | ICD-10-CM | POA: Diagnosis not present

## 2021-04-13 DIAGNOSIS — R051 Acute cough: Secondary | ICD-10-CM | POA: Diagnosis not present

## 2021-04-13 DIAGNOSIS — J309 Allergic rhinitis, unspecified: Secondary | ICD-10-CM | POA: Diagnosis not present

## 2021-05-06 DIAGNOSIS — R7989 Other specified abnormal findings of blood chemistry: Secondary | ICD-10-CM | POA: Diagnosis not present

## 2021-05-06 DIAGNOSIS — Z79899 Other long term (current) drug therapy: Secondary | ICD-10-CM | POA: Diagnosis not present

## 2021-05-06 DIAGNOSIS — M810 Age-related osteoporosis without current pathological fracture: Secondary | ICD-10-CM | POA: Diagnosis not present

## 2021-05-13 DIAGNOSIS — R82998 Other abnormal findings in urine: Secondary | ICD-10-CM | POA: Diagnosis not present

## 2021-05-20 DIAGNOSIS — M533 Sacrococcygeal disorders, not elsewhere classified: Secondary | ICD-10-CM | POA: Diagnosis not present

## 2021-05-26 DIAGNOSIS — M79645 Pain in left finger(s): Secondary | ICD-10-CM | POA: Diagnosis not present

## 2021-06-04 DIAGNOSIS — D225 Melanocytic nevi of trunk: Secondary | ICD-10-CM | POA: Diagnosis not present

## 2021-06-04 DIAGNOSIS — Z85828 Personal history of other malignant neoplasm of skin: Secondary | ICD-10-CM | POA: Diagnosis not present

## 2021-06-04 DIAGNOSIS — I8392 Asymptomatic varicose veins of left lower extremity: Secondary | ICD-10-CM | POA: Diagnosis not present

## 2021-06-04 DIAGNOSIS — L814 Other melanin hyperpigmentation: Secondary | ICD-10-CM | POA: Diagnosis not present

## 2021-08-02 DIAGNOSIS — J309 Allergic rhinitis, unspecified: Secondary | ICD-10-CM | POA: Diagnosis not present

## 2021-08-02 DIAGNOSIS — M810 Age-related osteoporosis without current pathological fracture: Secondary | ICD-10-CM | POA: Diagnosis not present

## 2021-08-02 DIAGNOSIS — M179 Osteoarthritis of knee, unspecified: Secondary | ICD-10-CM | POA: Diagnosis not present

## 2021-08-19 DIAGNOSIS — D1801 Hemangioma of skin and subcutaneous tissue: Secondary | ICD-10-CM | POA: Diagnosis not present

## 2021-08-19 DIAGNOSIS — Z85828 Personal history of other malignant neoplasm of skin: Secondary | ICD-10-CM | POA: Diagnosis not present

## 2021-08-19 DIAGNOSIS — B078 Other viral warts: Secondary | ICD-10-CM | POA: Diagnosis not present

## 2021-08-19 DIAGNOSIS — L245 Irritant contact dermatitis due to other chemical products: Secondary | ICD-10-CM | POA: Diagnosis not present

## 2021-08-27 ENCOUNTER — Telehealth: Payer: Self-pay | Admitting: *Deleted

## 2021-08-27 NOTE — Telephone Encounter (Signed)
This RN spoke with pt per her call stating her 5 years of tamoxifen will be completed on 09/18/2021- she is scheduled for follow up in early April. She is asking if she stops the tamoxifen on her 5 year date or continue until she is seen.  This RN informed per post chart review she may stop the tamoxifen on 09/18/2021 and then follow up as scheduled.

## 2021-08-28 DIAGNOSIS — M810 Age-related osteoporosis without current pathological fracture: Secondary | ICD-10-CM | POA: Diagnosis not present

## 2021-09-01 DIAGNOSIS — J309 Allergic rhinitis, unspecified: Secondary | ICD-10-CM | POA: Diagnosis not present

## 2021-09-01 DIAGNOSIS — M810 Age-related osteoporosis without current pathological fracture: Secondary | ICD-10-CM | POA: Diagnosis not present

## 2021-09-01 DIAGNOSIS — M179 Osteoarthritis of knee, unspecified: Secondary | ICD-10-CM | POA: Diagnosis not present

## 2021-09-09 DIAGNOSIS — Z1231 Encounter for screening mammogram for malignant neoplasm of breast: Secondary | ICD-10-CM | POA: Diagnosis not present

## 2021-09-11 ENCOUNTER — Telehealth: Payer: Self-pay | Admitting: Adult Health

## 2021-09-11 NOTE — Telephone Encounter (Signed)
.  Called patient to schedule appointment per 3/9 inbasket, patient is aware of date and time.   ?

## 2021-09-16 DIAGNOSIS — H26492 Other secondary cataract, left eye: Secondary | ICD-10-CM | POA: Diagnosis not present

## 2021-09-16 DIAGNOSIS — H35363 Drusen (degenerative) of macula, bilateral: Secondary | ICD-10-CM | POA: Diagnosis not present

## 2021-09-16 DIAGNOSIS — H40013 Open angle with borderline findings, low risk, bilateral: Secondary | ICD-10-CM | POA: Diagnosis not present

## 2021-09-16 DIAGNOSIS — H04123 Dry eye syndrome of bilateral lacrimal glands: Secondary | ICD-10-CM | POA: Diagnosis not present

## 2021-09-30 DIAGNOSIS — M533 Sacrococcygeal disorders, not elsewhere classified: Secondary | ICD-10-CM | POA: Diagnosis not present

## 2021-10-12 ENCOUNTER — Encounter: Payer: Medicare Other | Admitting: Adult Health

## 2021-10-19 ENCOUNTER — Encounter: Payer: Self-pay | Admitting: Adult Health

## 2021-10-19 ENCOUNTER — Inpatient Hospital Stay: Payer: Medicare Other | Attending: Adult Health | Admitting: Adult Health

## 2021-10-19 ENCOUNTER — Other Ambulatory Visit: Payer: Self-pay

## 2021-10-19 VITALS — BP 137/64 | HR 71 | Temp 97.7°F | Resp 16 | Ht 62.0 in | Wt 159.2 lb

## 2021-10-19 DIAGNOSIS — Z880 Allergy status to penicillin: Secondary | ICD-10-CM | POA: Insufficient documentation

## 2021-10-19 DIAGNOSIS — Z79899 Other long term (current) drug therapy: Secondary | ICD-10-CM | POA: Diagnosis not present

## 2021-10-19 DIAGNOSIS — C50311 Malignant neoplasm of lower-inner quadrant of right female breast: Secondary | ICD-10-CM | POA: Diagnosis not present

## 2021-10-19 DIAGNOSIS — Z886 Allergy status to analgesic agent status: Secondary | ICD-10-CM | POA: Insufficient documentation

## 2021-10-19 DIAGNOSIS — Z923 Personal history of irradiation: Secondary | ICD-10-CM | POA: Insufficient documentation

## 2021-10-19 DIAGNOSIS — Z86018 Personal history of other benign neoplasm: Secondary | ICD-10-CM | POA: Insufficient documentation

## 2021-10-19 DIAGNOSIS — Z881 Allergy status to other antibiotic agents status: Secondary | ICD-10-CM | POA: Insufficient documentation

## 2021-10-19 DIAGNOSIS — Z7289 Other problems related to lifestyle: Secondary | ICD-10-CM | POA: Insufficient documentation

## 2021-10-19 DIAGNOSIS — Z823 Family history of stroke: Secondary | ICD-10-CM | POA: Diagnosis not present

## 2021-10-19 DIAGNOSIS — D0511 Intraductal carcinoma in situ of right breast: Secondary | ICD-10-CM | POA: Insufficient documentation

## 2021-10-19 DIAGNOSIS — Z91030 Bee allergy status: Secondary | ICD-10-CM | POA: Diagnosis not present

## 2021-10-19 DIAGNOSIS — Z90722 Acquired absence of ovaries, bilateral: Secondary | ICD-10-CM | POA: Insufficient documentation

## 2021-10-19 DIAGNOSIS — Z170 Estrogen receptor positive status [ER+]: Secondary | ICD-10-CM | POA: Diagnosis not present

## 2021-10-19 DIAGNOSIS — Z8249 Family history of ischemic heart disease and other diseases of the circulatory system: Secondary | ICD-10-CM | POA: Insufficient documentation

## 2021-10-19 DIAGNOSIS — Z9049 Acquired absence of other specified parts of digestive tract: Secondary | ICD-10-CM | POA: Insufficient documentation

## 2021-10-19 DIAGNOSIS — Z882 Allergy status to sulfonamides status: Secondary | ICD-10-CM | POA: Diagnosis not present

## 2021-10-19 DIAGNOSIS — Z833 Family history of diabetes mellitus: Secondary | ICD-10-CM | POA: Diagnosis not present
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
## 2021-10-20 ENCOUNTER — Telehealth: Payer: Self-pay | Admitting: Adult Health

## 2021-10-20 NOTE — Telephone Encounter (Signed)
Scheduled follow-up appointment per 4/17 los. Patient is aware. ?

## 2022-02-10 DIAGNOSIS — M810 Age-related osteoporosis without current pathological fracture: Secondary | ICD-10-CM | POA: Diagnosis not present

## 2022-02-11 DIAGNOSIS — B078 Other viral warts: Secondary | ICD-10-CM | POA: Diagnosis not present

## 2022-02-11 DIAGNOSIS — Z85828 Personal history of other malignant neoplasm of skin: Secondary | ICD-10-CM | POA: Diagnosis not present

## 2022-02-16 DIAGNOSIS — M79645 Pain in left finger(s): Secondary | ICD-10-CM | POA: Diagnosis not present

## 2022-02-22 ENCOUNTER — Encounter: Payer: Self-pay | Admitting: Podiatry

## 2022-02-22 ENCOUNTER — Ambulatory Visit: Payer: Medicare Other | Admitting: Podiatry

## 2022-02-22 DIAGNOSIS — L600 Ingrowing nail: Secondary | ICD-10-CM | POA: Diagnosis not present

## 2022-02-22 NOTE — Patient Instructions (Signed)

## 2022-02-24 NOTE — Progress Notes (Signed)
Subjective:   Patient ID: Maria Jackson, female   DOB: 76 y.o.   MRN: [id number]   HPI Patient presents with chronic ingrown toenail third toe left foot lateral side that is painful and make shoe gear difficult.  She has been trying to trim it and she tried soaks without relief of symptoms and its been going on for several months patient does not smoke likes to be active   Review of Systems  All other systems reviewed and are negative.       Objective:  Physical Exam Vitals and nursing note reviewed.  Constitutional:      Appearance: She is well-developed.  Pulmonary:     Effort: Pulmonary effort is normal.  Musculoskeletal:        General: Normal range of motion.  Skin:    General: Skin is warm.  Neurological:     Mental Status: She is alert.     Neurovascular status intact muscle strength adequate range of motion within normal limits with patient found to have incurvated third digit lateral side painful when pressed with redness around the corner no active drainage no erythema noted.     Assessment:  Chronic ingrown toenail deformity third left lateral border with pain     Plan:  8 H&P reviewed condition recommended correction explained procedure risk and patient wants surgery.  I infiltrated the left third toe 60 mg like Marcaine mixture sterile prep done using sterile instrumentation remove the border exposed matrix applied phenol 3 applications 30 seconds followed by alcohol lavage sterile dressing gave instructions on soaks reappoint encouraged to leave dressing on 24 hours but take it off earlier if throbbing were to occur and to call with any questions concerns which may arise

## 2022-02-26 DIAGNOSIS — M810 Age-related osteoporosis without current pathological fracture: Secondary | ICD-10-CM | POA: Diagnosis not present

## 2022-03-05 DIAGNOSIS — S63112D Subluxation of metacarpophalangeal joint of left thumb, subsequent encounter: Secondary | ICD-10-CM | POA: Diagnosis not present

## 2022-03-25 DIAGNOSIS — S63112D Subluxation of metacarpophalangeal joint of left thumb, subsequent encounter: Secondary | ICD-10-CM | POA: Diagnosis not present

## 2022-03-31 DIAGNOSIS — M533 Sacrococcygeal disorders, not elsewhere classified: Secondary | ICD-10-CM | POA: Diagnosis not present

## 2022-04-20 DIAGNOSIS — M79645 Pain in left finger(s): Secondary | ICD-10-CM | POA: Diagnosis not present

## 2022-05-31 DIAGNOSIS — M810 Age-related osteoporosis without current pathological fracture: Secondary | ICD-10-CM | POA: Diagnosis not present

## 2022-05-31 DIAGNOSIS — E785 Hyperlipidemia, unspecified: Secondary | ICD-10-CM | POA: Diagnosis not present

## 2022-05-31 DIAGNOSIS — R7989 Other specified abnormal findings of blood chemistry: Secondary | ICD-10-CM | POA: Diagnosis not present

## 2022-06-07 DIAGNOSIS — E785 Hyperlipidemia, unspecified: Secondary | ICD-10-CM | POA: Diagnosis not present

## 2022-06-07 DIAGNOSIS — Z0000 Encounter for general adult medical examination without abnormal findings: Secondary | ICD-10-CM | POA: Diagnosis not present

## 2022-06-07 DIAGNOSIS — M810 Age-related osteoporosis without current pathological fracture: Secondary | ICD-10-CM | POA: Diagnosis not present

## 2022-06-07 DIAGNOSIS — R82998 Other abnormal findings in urine: Secondary | ICD-10-CM | POA: Diagnosis not present

## 2022-06-07 DIAGNOSIS — D692 Other nonthrombocytopenic purpura: Secondary | ICD-10-CM | POA: Diagnosis not present

## 2022-06-21 DIAGNOSIS — Z85828 Personal history of other malignant neoplasm of skin: Secondary | ICD-10-CM | POA: Diagnosis not present

## 2022-06-21 DIAGNOSIS — L821 Other seborrheic keratosis: Secondary | ICD-10-CM | POA: Diagnosis not present

## 2022-06-21 DIAGNOSIS — L812 Freckles: Secondary | ICD-10-CM | POA: Diagnosis not present

## 2022-06-21 DIAGNOSIS — L820 Inflamed seborrheic keratosis: Secondary | ICD-10-CM | POA: Diagnosis not present

## 2022-07-08 DIAGNOSIS — M79645 Pain in left finger(s): Secondary | ICD-10-CM | POA: Diagnosis not present

## 2022-08-03 DIAGNOSIS — M1812 Unilateral primary osteoarthritis of first carpometacarpal joint, left hand: Secondary | ICD-10-CM | POA: Diagnosis not present

## 2022-08-30 DIAGNOSIS — M533 Sacrococcygeal disorders, not elsewhere classified: Secondary | ICD-10-CM | POA: Diagnosis not present

## 2022-08-31 ENCOUNTER — Other Ambulatory Visit: Payer: Self-pay | Admitting: Orthopedic Surgery

## 2022-08-31 DIAGNOSIS — M545 Low back pain, unspecified: Secondary | ICD-10-CM

## 2022-09-01 DIAGNOSIS — M810 Age-related osteoporosis without current pathological fracture: Secondary | ICD-10-CM | POA: Diagnosis not present

## 2022-09-22 DIAGNOSIS — H26492 Other secondary cataract, left eye: Secondary | ICD-10-CM | POA: Diagnosis not present

## 2022-09-22 DIAGNOSIS — H35363 Drusen (degenerative) of macula, bilateral: Secondary | ICD-10-CM | POA: Diagnosis not present

## 2022-09-22 DIAGNOSIS — H40013 Open angle with borderline findings, low risk, bilateral: Secondary | ICD-10-CM | POA: Diagnosis not present

## 2022-09-22 DIAGNOSIS — H16223 Keratoconjunctivitis sicca, not specified as Sjogren's, bilateral: Secondary | ICD-10-CM | POA: Diagnosis not present

## 2022-09-29 ENCOUNTER — Encounter: Payer: Self-pay | Admitting: Podiatry

## 2022-09-29 ENCOUNTER — Ambulatory Visit: Payer: Medicare Other | Admitting: Podiatry

## 2022-09-29 DIAGNOSIS — E785 Hyperlipidemia, unspecified: Secondary | ICD-10-CM | POA: Insufficient documentation

## 2022-09-29 DIAGNOSIS — Z780 Asymptomatic menopausal state: Secondary | ICD-10-CM | POA: Diagnosis not present

## 2022-09-29 DIAGNOSIS — L600 Ingrowing nail: Secondary | ICD-10-CM | POA: Diagnosis not present

## 2022-09-29 DIAGNOSIS — H6121 Impacted cerumen, right ear: Secondary | ICD-10-CM | POA: Diagnosis not present

## 2022-09-29 DIAGNOSIS — M8589 Other specified disorders of bone density and structure, multiple sites: Secondary | ICD-10-CM | POA: Diagnosis not present

## 2022-09-29 DIAGNOSIS — F325 Major depressive disorder, single episode, in full remission: Secondary | ICD-10-CM | POA: Insufficient documentation

## 2022-09-29 DIAGNOSIS — M810 Age-related osteoporosis without current pathological fracture: Secondary | ICD-10-CM | POA: Diagnosis not present

## 2022-09-29 DIAGNOSIS — Z1231 Encounter for screening mammogram for malignant neoplasm of breast: Secondary | ICD-10-CM | POA: Diagnosis not present

## 2022-09-30 ENCOUNTER — Ambulatory Visit
Admission: RE | Admit: 2022-09-30 | Discharge: 2022-09-30 | Disposition: A | Discharge status: Home / Self Care | Payer: Medicare Other | Source: Ambulatory Visit | Attending: Orthopedic Surgery | Admitting: Orthopedic Surgery

## 2022-09-30 DIAGNOSIS — M545 Low back pain, unspecified: Secondary | ICD-10-CM

## 2022-09-30 DIAGNOSIS — M533 Sacrococcygeal disorders, not elsewhere classified: Secondary | ICD-10-CM | POA: Diagnosis not present

## 2022-09-30 NOTE — Progress Notes (Signed)
Subjective:   Patient ID: Maria Jackson, female   DOB: 77 y.o.   MRN: [id number]   HPI Patient presents concerned about the lateral border of the left third toe where a previous ingrown was done and it feels rough and also concerned may be possible ingrown right big toenail   ROS      Objective:  Physical Exam  Neurovascular status intact with some thickness of the left third nail which is generalized across the entire nailbed with slight incurvation of the right hallux medial border     Assessment:  Nail disease left third which may be more related to generalized condition and ingrown toenail deformity right big toe     Plan:  H&P reviewed and went ahead today and I debrided some tissue to take pressure off it and advised ingrown of the big toe and it may need to be corrected.  Will get a hold off currently she will soak and if it does continue to bother her it would need to be fixed for the long-term

## 2022-10-11 DIAGNOSIS — M533 Sacrococcygeal disorders, not elsewhere classified: Secondary | ICD-10-CM | POA: Diagnosis not present

## 2022-10-18 DIAGNOSIS — K080 Exfoliation of teeth due to systemic causes: Secondary | ICD-10-CM | POA: Diagnosis not present

## 2022-10-20 ENCOUNTER — Inpatient Hospital Stay: Payer: Medicare Other | Attending: Adult Health | Admitting: Adult Health

## 2022-10-20 ENCOUNTER — Other Ambulatory Visit: Payer: Self-pay

## 2022-10-20 ENCOUNTER — Encounter: Payer: Self-pay | Admitting: Adult Health

## 2022-10-20 VITALS — BP 134/48 | HR 57 | Temp 97.9°F | Resp 16 | Ht 62.0 in | Wt 155.3 lb

## 2022-10-20 DIAGNOSIS — C50311 Malignant neoplasm of lower-inner quadrant of right female breast: Secondary | ICD-10-CM | POA: Diagnosis not present

## 2022-10-20 DIAGNOSIS — Z8249 Family history of ischemic heart disease and other diseases of the circulatory system: Secondary | ICD-10-CM | POA: Insufficient documentation

## 2022-10-20 DIAGNOSIS — Z79899 Other long term (current) drug therapy: Secondary | ICD-10-CM | POA: Insufficient documentation

## 2022-10-20 DIAGNOSIS — Z86018 Personal history of other benign neoplasm: Secondary | ICD-10-CM | POA: Insufficient documentation

## 2022-10-20 DIAGNOSIS — E785 Hyperlipidemia, unspecified: Secondary | ICD-10-CM | POA: Diagnosis not present

## 2022-10-20 DIAGNOSIS — Z9049 Acquired absence of other specified parts of digestive tract: Secondary | ICD-10-CM | POA: Diagnosis not present

## 2022-10-20 DIAGNOSIS — Z170 Estrogen receptor positive status [ER+]: Secondary | ICD-10-CM | POA: Insufficient documentation

## 2022-10-20 DIAGNOSIS — Z833 Family history of diabetes mellitus: Secondary | ICD-10-CM | POA: Diagnosis not present

## 2022-10-20 DIAGNOSIS — Z90722 Acquired absence of ovaries, bilateral: Secondary | ICD-10-CM | POA: Insufficient documentation

## 2022-10-20 DIAGNOSIS — Z90710 Acquired absence of both cervix and uterus: Secondary | ICD-10-CM | POA: Insufficient documentation

## 2022-10-20 NOTE — Assessment & Plan Note (Signed)
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  1.  Stage 0 breast cancer: She has no clinical or radiographic signs of breast cancer recurrence today.  She will continue to undergo annual mammograms next due in March 2025. 2.  Bone health: She receives Prolia with her primary care provider every 6 months and is tolerating this well.  He is following her bone health.  We briefly discussed calcium intake today since she does eat mostly a vegan diet. 3.  Health maintenance: I recommended continued healthy diet and exercise as she is able with her SI joint discomfort.  We will see Maria Jackson back in 1 year for continued long-term surveillance.  She knows to call for any questions or concerns that may arise between now and her next visit.
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
## 2022-10-21 ENCOUNTER — Encounter: Payer: Self-pay | Admitting: Adult Health

## 2022-10-27 DIAGNOSIS — K080 Exfoliation of teeth due to systemic causes: Secondary | ICD-10-CM | POA: Diagnosis not present

## 2022-11-01 DIAGNOSIS — K080 Exfoliation of teeth due to systemic causes: Secondary | ICD-10-CM | POA: Diagnosis not present

## 2022-11-03 HISTORY — PX: OTHER SURGICAL HISTORY: SHX169

## 2022-11-04 DIAGNOSIS — K080 Exfoliation of teeth due to systemic causes: Secondary | ICD-10-CM | POA: Diagnosis not present

## 2022-11-16 DIAGNOSIS — M461 Sacroiliitis, not elsewhere classified: Secondary | ICD-10-CM | POA: Diagnosis not present

## 2022-12-01 DIAGNOSIS — M461 Sacroiliitis, not elsewhere classified: Secondary | ICD-10-CM | POA: Diagnosis not present

## 2022-12-28 DIAGNOSIS — M461 Sacroiliitis, not elsewhere classified: Secondary | ICD-10-CM | POA: Diagnosis not present

## 2023-01-04 DIAGNOSIS — M4328 Fusion of spine, sacral and sacrococcygeal region: Secondary | ICD-10-CM | POA: Diagnosis not present

## 2023-01-04 DIAGNOSIS — R531 Weakness: Secondary | ICD-10-CM | POA: Diagnosis not present

## 2023-01-13 DIAGNOSIS — M4328 Fusion of spine, sacral and sacrococcygeal region: Secondary | ICD-10-CM | POA: Diagnosis not present

## 2023-01-13 DIAGNOSIS — R531 Weakness: Secondary | ICD-10-CM | POA: Diagnosis not present

## 2023-01-26 DIAGNOSIS — R531 Weakness: Secondary | ICD-10-CM | POA: Diagnosis not present

## 2023-01-26 DIAGNOSIS — M4328 Fusion of spine, sacral and sacrococcygeal region: Secondary | ICD-10-CM | POA: Diagnosis not present

## 2023-01-31 DIAGNOSIS — M4328 Fusion of spine, sacral and sacrococcygeal region: Secondary | ICD-10-CM | POA: Diagnosis not present

## 2023-01-31 DIAGNOSIS — R531 Weakness: Secondary | ICD-10-CM | POA: Diagnosis not present

## 2023-02-07 DIAGNOSIS — M25552 Pain in left hip: Secondary | ICD-10-CM | POA: Diagnosis not present

## 2023-02-07 DIAGNOSIS — R531 Weakness: Secondary | ICD-10-CM | POA: Diagnosis not present

## 2023-02-07 DIAGNOSIS — M4328 Fusion of spine, sacral and sacrococcygeal region: Secondary | ICD-10-CM | POA: Diagnosis not present

## 2023-02-18 DIAGNOSIS — M810 Age-related osteoporosis without current pathological fracture: Secondary | ICD-10-CM | POA: Diagnosis not present

## 2023-03-03 DIAGNOSIS — M810 Age-related osteoporosis without current pathological fracture: Secondary | ICD-10-CM | POA: Diagnosis not present

## 2023-03-10 ENCOUNTER — Encounter: Payer: Self-pay | Admitting: Gastroenterology

## 2023-03-23 DIAGNOSIS — H6121 Impacted cerumen, right ear: Secondary | ICD-10-CM | POA: Diagnosis not present

## 2023-03-29 ENCOUNTER — Encounter: Payer: Self-pay | Admitting: Gastroenterology

## 2023-04-06 DIAGNOSIS — K080 Exfoliation of teeth due to systemic causes: Secondary | ICD-10-CM | POA: Diagnosis not present

## 2023-06-08 DIAGNOSIS — K080 Exfoliation of teeth due to systemic causes: Secondary | ICD-10-CM | POA: Diagnosis not present

## 2023-06-14 DIAGNOSIS — M461 Sacroiliitis, not elsewhere classified: Secondary | ICD-10-CM | POA: Diagnosis not present

## 2023-07-13 DIAGNOSIS — K080 Exfoliation of teeth due to systemic causes: Secondary | ICD-10-CM | POA: Diagnosis not present

## 2023-07-27 DIAGNOSIS — M810 Age-related osteoporosis without current pathological fracture: Secondary | ICD-10-CM | POA: Diagnosis not present

## 2023-07-27 DIAGNOSIS — E785 Hyperlipidemia, unspecified: Secondary | ICD-10-CM | POA: Diagnosis not present

## 2023-07-29 ENCOUNTER — Ambulatory Visit: Payer: Medicare Other | Admitting: Gastroenterology

## 2023-08-03 DIAGNOSIS — L821 Other seborrheic keratosis: Secondary | ICD-10-CM | POA: Diagnosis not present

## 2023-08-03 DIAGNOSIS — Z1331 Encounter for screening for depression: Secondary | ICD-10-CM | POA: Diagnosis not present

## 2023-08-03 DIAGNOSIS — D225 Melanocytic nevi of trunk: Secondary | ICD-10-CM | POA: Diagnosis not present

## 2023-08-03 DIAGNOSIS — M810 Age-related osteoporosis without current pathological fracture: Secondary | ICD-10-CM | POA: Diagnosis not present

## 2023-08-03 DIAGNOSIS — R82998 Other abnormal findings in urine: Secondary | ICD-10-CM | POA: Diagnosis not present

## 2023-08-03 DIAGNOSIS — L820 Inflamed seborrheic keratosis: Secondary | ICD-10-CM | POA: Diagnosis not present

## 2023-08-03 DIAGNOSIS — L570 Actinic keratosis: Secondary | ICD-10-CM | POA: Diagnosis not present

## 2023-08-03 DIAGNOSIS — Z85828 Personal history of other malignant neoplasm of skin: Secondary | ICD-10-CM | POA: Diagnosis not present

## 2023-08-03 DIAGNOSIS — Z23 Encounter for immunization: Secondary | ICD-10-CM | POA: Diagnosis not present

## 2023-08-03 DIAGNOSIS — Z0000 Encounter for general adult medical examination without abnormal findings: Secondary | ICD-10-CM | POA: Diagnosis not present

## 2023-08-03 DIAGNOSIS — Z1339 Encounter for screening examination for other mental health and behavioral disorders: Secondary | ICD-10-CM | POA: Diagnosis not present

## 2023-08-31 ENCOUNTER — Encounter: Payer: Self-pay | Admitting: Gastroenterology

## 2023-08-31 ENCOUNTER — Ambulatory Visit: Payer: Medicare Other | Admitting: Gastroenterology

## 2023-08-31 VITALS — BP 120/70 | HR 58 | Ht 62.5 in | Wt 155.0 lb

## 2023-08-31 DIAGNOSIS — Z860100 Personal history of colon polyps, unspecified: Secondary | ICD-10-CM | POA: Diagnosis not present

## 2023-08-31 DIAGNOSIS — Z09 Encounter for follow-up examination after completed treatment for conditions other than malignant neoplasm: Secondary | ICD-10-CM

## 2023-08-31 MED ORDER — SUTAB 1479-225-188 MG PO TABS: ORAL_TABLET | ORAL | 0 refills | Status: DC

## 2023-08-31 NOTE — Patient Instructions (Addendum)
 You have been scheduled for a colonoscopy. Please follow written instructions given to you at your visit today.   If you use inhalers (even only as needed), please bring them with you on the day of your procedure.  DO NOT TAKE 7 DAYS PRIOR TO TEST- Trulicity (dulaglutide) Ozempic, Wegovy (semaglutide) Mounjaro (tirzepatide) Bydureon Bcise (exanatide extended release)  DO NOT TAKE 1 DAY PRIOR TO YOUR TEST Rybelsus (semaglutide) Adlyxin (lixisenatide) Victoza (liraglutide) Byetta (exanatide) ___________________________________________________________________________  Bonita Quin will receive your bowel preparation through [facility], which ensures the lowest copay and home delivery, with outreach via text or call from an [phone] number. Please respond promptly to avoid rescheduling of your procedure. If you are interested in alternative options or have any questions regarding your prep, please contact them at [phone] ____________________________________________________________________________  Your Provider Has Sent Your Bowel Prep Regimen To [facility]   [facility] will contact you to verify your information and collect your copay, if applicable. Enjoy the comfort of your home while your prescription is mailed to you, FREE of any shipping charges.   [facility] accepts all major insurance benefits and applies discounts & coupons.  Have additional questions?   Chat: [email or web address] Call: [phone] Email: [email or web address] NCPDP: [id number]  How will [facility] contact you?  With a Welcome phone call,  a Welcome text and a checkout link in text form.  Texts you receive from [phone] Are NOT Spam.  *To set up delivery, you must complete the checkout process via link or speak to one of the patient care representatives. If [facility] is unable to reach you, your prescription may be delayed.  To avoid long hold times on the phone, you may also utilize the secure chat  feature on the [facility] website to request that they call you back for transaction completion or to expedite your concerns.   Thank you for entrusting me with your care and for choosing [facility], Dr. Ileene Patrick     If your blood pressure at your visit was 140/90 or greater, please contact your primary care physician to follow up on this. ______________________________________________________  If you are age 80 or older, your body mass index should be between 23-30. Your Body mass index is 27.9 kg/m. If this is out of the aforementioned range listed, please consider follow up with your Primary Care Provider.  If you are age 80 or younger, your body mass index should be between 19-25. Your Body mass index is 27.9 kg/m. If this is out of the aformentioned range listed, please consider follow up with your Primary Care Provider.  ________________________________________________________  The [facility] providers would like to encourage you to use [facility] to communicate with providers for non-urgent requests or questions.  Due to long hold times on the telephone, sending your provider a message by [facility] may be a faster and more efficient way to get a response.  Please allow 48 business hours for a response.  Please remember that this is for non-urgent requests.  _______________________________________________________  Due to recent changes in healthcare laws, you may see the results of your imaging and laboratory studies on MyChart before your provider has had a chance to review them.  We understand that in some cases there may be results that are confusing or concerning to you. Not all laboratory results come back in the same time frame and the provider may be waiting for multiple results in order to interpret others.  Please give [place] 48 hours in order for your provider to thoroughly review all  the results before contacting the office for clarification of your results.
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
## 2023-09-05 ENCOUNTER — Encounter (HOSPITAL_COMMUNITY): Payer: Medicare Other

## 2023-09-06 DIAGNOSIS — K080 Exfoliation of teeth due to systemic causes: Secondary | ICD-10-CM | POA: Diagnosis not present

## 2023-09-21 ENCOUNTER — Encounter: Payer: Self-pay | Admitting: Gastroenterology

## 2023-09-28 ENCOUNTER — Encounter: Payer: Self-pay | Admitting: Gastroenterology

## 2023-09-28 ENCOUNTER — Ambulatory Visit: Payer: Medicare Other | Admitting: Gastroenterology

## 2023-09-28 ENCOUNTER — Telehealth: Payer: Self-pay | Admitting: Gastroenterology

## 2023-09-28 VITALS — BP 159/72 | HR 69 | Temp 98.0°F | Resp 15 | Ht 62.5 in | Wt 155.0 lb

## 2023-09-28 DIAGNOSIS — D123 Benign neoplasm of transverse colon: Secondary | ICD-10-CM | POA: Diagnosis not present

## 2023-09-28 DIAGNOSIS — Z1211 Encounter for screening for malignant neoplasm of colon: Secondary | ICD-10-CM | POA: Diagnosis not present

## 2023-09-28 DIAGNOSIS — K635 Polyp of colon: Secondary | ICD-10-CM | POA: Diagnosis not present

## 2023-09-28 DIAGNOSIS — K648 Other hemorrhoids: Secondary | ICD-10-CM

## 2023-09-28 DIAGNOSIS — D122 Benign neoplasm of ascending colon: Secondary | ICD-10-CM | POA: Diagnosis not present

## 2023-09-28 DIAGNOSIS — Z860101 Personal history of adenomatous and serrated colon polyps: Secondary | ICD-10-CM | POA: Diagnosis not present

## 2023-09-28 DIAGNOSIS — K5730 Diverticulosis of large intestine without perforation or abscess without bleeding: Secondary | ICD-10-CM | POA: Diagnosis not present

## 2023-09-28 DIAGNOSIS — D120 Benign neoplasm of cecum: Secondary | ICD-10-CM | POA: Diagnosis not present

## 2023-09-28 DIAGNOSIS — Q439 Congenital malformation of intestine, unspecified: Secondary | ICD-10-CM

## 2023-09-28 DIAGNOSIS — Z860100 Personal history of colon polyps, unspecified: Secondary | ICD-10-CM

## 2023-09-28 MED ORDER — SODIUM CHLORIDE 0.9 % IV SOLN
500.0000 mL | Freq: Once | INTRAVENOUS | Status: DC
Start: 2023-09-28 — End: 2023-09-28

## 2023-09-28 NOTE — Op Note (Signed)
 [facility] Patient Name: Maria Jackson Procedure Date: 09/28/2023 3:19 PM MRN: [id number] Endoscopist: Viviann Spare P. Adela Lank , MD, [id number] Age: 78 Referring MD:  Date of Birth: 07-05-1946 Gender: Female Account #: [id number] Procedure:                Colonoscopy Indications:              High risk colon cancer surveillance: Personal                            history of colonic polyps - multiple polyps removed                            in the past, last exam 03/2020 - 5 polyps, advanced                            polyp 01/2017 Medicines:                Monitored Anesthesia Care Procedure:                Pre-Anesthesia Assessment:                           - Prior to the procedure, a History and Physical                            was performed, and patient medications and                            allergies were reviewed. The patient's tolerance of                            previous anesthesia was also reviewed. The risks                            and benefits of the procedure and the sedation                            options and risks were discussed with the patient.                            All questions were answered, and informed consent                            was obtained. Prior Anticoagulants: The patient has                            taken no anticoagulant or antiplatelet agents. ASA                            Grade Assessment: II - A patient with mild systemic                            disease. After reviewing the risks and benefits,  the patient was deemed in satisfactory condition to                            undergo the procedure.                           After obtaining informed consent, the colonoscope                            was passed under direct vision. Throughout the                            procedure, the patient's blood pressure, pulse, and                            oxygen saturations were monitored  continuously. The                            Olympus Scope [id number] was introduced through the                            anus and advanced to the the cecum, identified by                            appendiceal orifice and ileocecal valve. The                            colonoscopy was technically difficult and complex                            due to a tortuous colon. The patient tolerated the                            procedure well. The quality of the bowel                            preparation was adequate following extensive                            lavage. . The ileocecal valve, appendiceal orifice,                            and rectum were photographed. Scope In: 3:51:15 PM Scope Out: 4:28:05 PM Scope Withdrawal Time: 0 hours 24 minutes 39 seconds  Total Procedure Duration: 0 hours 36 minutes 50 seconds  Findings:                 The perianal and digital rectal examinations were                            normal.                           The terminal ileum appeared normal.  A diminutive polyp was found in the cecum. The                            polyp was flat. The polyp was removed with a cold                            snare. Resection and retrieval were complete.                           Two sessile polyps were found in the ascending                            colon. The polyps were 3 mm in size. These polyps                            were removed with a cold snare. Resection and                            retrieval were complete.                           A 4 mm polyp was found in the hepatic flexure. The                            polyp was flat. The polyp was removed with a cold                            snare. Resection and retrieval were complete.                           Two flat polyps were found in the transverse colon.                            The polyps were 3 mm in size. These polyps were                            removed  with a cold snare. Resection and retrieval                            were complete.                           Multiple small-mouthed diverticula were found in                            the sigmoid colon.                           The colon was extremely tortuous, making for                            difficult cecal intubation in the setting of fair  prep requiring extensive lavage.                           Internal hemorrhoids were found during                            retroflexion. The hemorrhoids were small.                           The prep was fair on insertion of the colonoscope                            making for a very difficult cecal intubation in the                            setting of tortous colon. Several minutes spent                            lavaging the colon to achieve adequate views. The                            exam was otherwise without abnormality. Complications:            No immediate complications. Estimated blood loss:                            Minimal. Estimated Blood Loss:     Estimated blood loss was minimal. Impression:               - One diminutive polyp in the cecum, removed with a                            cold snare. Resected and retrieved.                           - Two 3 mm polyps in the ascending colon, removed                            with a cold snare. Resected and retrieved.                           - One 4 mm polyp at the hepatic flexure, removed                            with a cold snare. Resected and retrieved.                           - Two 3 mm polyps in the transverse colon, removed                            with a cold snare. Resected and retrieved.                           - Diverticulosis in the sigmoid colon.                           -  Tortuous colon.                           - Internal hemorrhoids.                           - The examination was otherwise normal. Recommendation:            - Patient has a contact number available for                            emergencies. The signs and symptoms of potential                            delayed complications were discussed with the                            patient. Return to normal activities tomorrow.                            Written discharge instructions were provided to the                            patient.                           - Resume previous diet.                           - Continue present medications.                           - Await pathology results. No further colonoscopy                            exams recommended given no high risk lesions on                            this exam, and the patient's age / difficulty of                            exam, higher risk for complications Willaim Rayas. Adela Lank, MD 09/28/2023 4:45:17 PM This report has been signed electronically.

## 2023-09-28 NOTE — Progress Notes (Signed)
 Pt's states no medical or surgical changes since previsit or office visit.

## 2023-09-28 NOTE — Telephone Encounter (Signed)
 Returned the patient's phone call. She took 1/2 a miralax prep yesterday morning and the first half of her SUTAB prep with some nausea and small amount of vomiting. She started going to the bathroom and had brown liquid, no solid stool. This morning she took the second half of the pills as well as some more Miralax after talking with Dr. Marina Goodell this morning. After the 12 th pill she had a fail amount of vomiting and felt that she was unable to drink the adequate amount of water. Her stool currently is brown liquid but at times it is clear. Spoke with Dr. Adela Lank. Will have the patient continue to drink some miralax and clear liquids until 1:30. May give an enema when she arrives for her procedure. Pt verbalized understanding.

## 2023-09-28 NOTE — Patient Instructions (Signed)

## 2023-09-28 NOTE — Telephone Encounter (Signed)
 Inbound call from patient stating that she is scheduled to have a colonoscopy today at 3:00 with Dr. Adela Lank. Patient stated that last she took her prep pills and got sick. Patient stated that she called and spoke with Dr. Marina Goodell and advised patient to take her pills slower this morning. Patient stated that she took the pills again this morning and got sick again. Patient stated that her bowel movements aren't solid. Patient stated the water in the toilet is brown. Patient also states she has not been able to drink he correct amount water. Patient is requesting  call back to discuss. Please advise.
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
## 2023-09-29 ENCOUNTER — Telehealth: Payer: Self-pay

## 2023-09-29 NOTE — Telephone Encounter (Signed)
 Left message on follow up call.

## 2023-10-03 LAB — SURGICAL PATHOLOGY

## 2023-10-05 DIAGNOSIS — Z1231 Encounter for screening mammogram for malignant neoplasm of breast: Secondary | ICD-10-CM | POA: Diagnosis not present

## 2023-10-10 ENCOUNTER — Encounter: Payer: Self-pay | Admitting: Adult Health

## 2023-10-10 ENCOUNTER — Encounter: Payer: Self-pay | Admitting: Gastroenterology

## 2023-10-10 DIAGNOSIS — K080 Exfoliation of teeth due to systemic causes: Secondary | ICD-10-CM | POA: Diagnosis not present

## 2023-10-20 ENCOUNTER — Encounter: Payer: Medicare Other | Admitting: Adult Health

## 2023-10-24 ENCOUNTER — Encounter: Admitting: Adult Health

## 2023-10-27 ENCOUNTER — Inpatient Hospital Stay: Attending: Adult Health | Admitting: Adult Health

## 2023-10-27 ENCOUNTER — Encounter: Payer: Self-pay | Admitting: Adult Health

## 2023-10-27 VITALS — BP 148/55 | HR 62 | Temp 98.0°F | Resp 16 | Wt 155.2 lb

## 2023-10-27 DIAGNOSIS — Z923 Personal history of irradiation: Secondary | ICD-10-CM | POA: Insufficient documentation

## 2023-10-27 DIAGNOSIS — Z170 Estrogen receptor positive status [ER+]: Secondary | ICD-10-CM | POA: Diagnosis not present

## 2023-10-27 DIAGNOSIS — C50311 Malignant neoplasm of lower-inner quadrant of right female breast: Secondary | ICD-10-CM | POA: Insufficient documentation

## 2023-10-27 DIAGNOSIS — Z79810 Long term (current) use of selective estrogen receptor modulators (SERMs): Secondary | ICD-10-CM | POA: Insufficient documentation
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
## 2023-10-30 NOTE — Assessment & Plan Note (Signed)
 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  Breast cancer No recurrence on mammogram. Post-treatment tenderness and pain are normal. - Continue annual mammograms.  Colonic polyps Six precancerous polyps found. Current GI specialist advises against further colonoscopy due to age and risk. Advised to seek second opinion. - Consult with Doctor Bernetta Brilliant for a second opinion on colonoscopy recommendations.  Coronary artery calcification Calcification present. In 43rd percentile for age. Crestor prescribed to manage calcification. Discussed side effects. - Initiate Crestor therapy. - Monitor for side effects of Crestor, such as muscle aches.  Osteoporosis Prolia  treatment paused for dental work, resuming next week at [facility] outpatient. - Resume Prolia  treatment next week at [facility] outpatient.

## 2023-10-31 DIAGNOSIS — Z9049 Acquired absence of other specified parts of digestive tract: Secondary | ICD-10-CM | POA: Diagnosis not present

## 2023-10-31 DIAGNOSIS — K838 Other specified diseases of biliary tract: Secondary | ICD-10-CM | POA: Diagnosis not present

## 2023-11-01 ENCOUNTER — Other Ambulatory Visit (HOSPITAL_COMMUNITY): Payer: Self-pay

## 2023-11-02 ENCOUNTER — Ambulatory Visit (HOSPITAL_COMMUNITY)
Admission: RE | Admit: 2023-11-02 | Discharge: 2023-11-02 | Disposition: A | Discharge status: Home / Self Care | Payer: Medicare Other | Source: Ambulatory Visit | Attending: Internal Medicine | Admitting: Internal Medicine

## 2023-11-02 DIAGNOSIS — D1801 Hemangioma of skin and subcutaneous tissue: Secondary | ICD-10-CM | POA: Diagnosis not present

## 2023-11-02 DIAGNOSIS — M810 Age-related osteoporosis without current pathological fracture: Secondary | ICD-10-CM | POA: Diagnosis not present

## 2023-11-02 DIAGNOSIS — Z85828 Personal history of other malignant neoplasm of skin: Secondary | ICD-10-CM | POA: Diagnosis not present

## 2023-11-02 DIAGNOSIS — L820 Inflamed seborrheic keratosis: Secondary | ICD-10-CM | POA: Diagnosis not present

## 2023-11-02 MED ORDER — DENOSUMAB 60 MG/ML ~~LOC~~ SOSY
PREFILLED_SYRINGE | SUBCUTANEOUS | Status: AC
  Filled 2023-11-02: qty 1

## 2023-11-02 MED ORDER — DENOSUMAB 60 MG/ML ~~LOC~~ SOSY
60.0000 mg | PREFILLED_SYRINGE | Freq: Once | SUBCUTANEOUS | Status: AC
  Administered 2023-11-02: 60 mg via SUBCUTANEOUS

## 2023-11-21 DIAGNOSIS — H26492 Other secondary cataract, left eye: Secondary | ICD-10-CM | POA: Diagnosis not present

## 2023-11-21 DIAGNOSIS — Z961 Presence of intraocular lens: Secondary | ICD-10-CM | POA: Diagnosis not present

## 2023-11-21 DIAGNOSIS — H35363 Drusen (degenerative) of macula, bilateral: Secondary | ICD-10-CM | POA: Diagnosis not present

## 2023-11-21 DIAGNOSIS — H16223 Keratoconjunctivitis sicca, not specified as Sjogren's, bilateral: Secondary | ICD-10-CM | POA: Diagnosis not present

## 2023-11-21 DIAGNOSIS — H40013 Open angle with borderline findings, low risk, bilateral: Secondary | ICD-10-CM | POA: Diagnosis not present

## 2023-11-29 DIAGNOSIS — H6121 Impacted cerumen, right ear: Secondary | ICD-10-CM | POA: Diagnosis not present

## 2024-01-04 DIAGNOSIS — K080 Exfoliation of teeth due to systemic causes: Secondary | ICD-10-CM | POA: Diagnosis not present

## 2024-02-01 DIAGNOSIS — M810 Age-related osteoporosis without current pathological fracture: Secondary | ICD-10-CM | POA: Diagnosis not present

## 2024-02-01 DIAGNOSIS — E785 Hyperlipidemia, unspecified: Secondary | ICD-10-CM | POA: Diagnosis not present

## 2024-03-28 ENCOUNTER — Other Ambulatory Visit (HOSPITAL_COMMUNITY): Payer: Self-pay | Admitting: Internal Medicine

## 2024-04-02 DIAGNOSIS — H6121 Impacted cerumen, right ear: Secondary | ICD-10-CM | POA: Diagnosis not present

## 2024-04-02 DIAGNOSIS — H6041 Cholesteatoma of right external ear: Secondary | ICD-10-CM | POA: Diagnosis not present

## 2024-04-18 ENCOUNTER — Telehealth (HOSPITAL_COMMUNITY): Payer: Self-pay | Admitting: Pharmacy Technician

## 2024-04-18 ENCOUNTER — Other Ambulatory Visit (HOSPITAL_COMMUNITY): Payer: Self-pay | Admitting: Internal Medicine

## 2024-04-18 NOTE — Telephone Encounter (Signed)
 NOTE: Per [facility] practice, MD approved biosimilars switch for Prolia  per payor preferences.   Auth Submission: NO AUTH NEEDED Site of care: [facility] Payer: [employer] MEDICARE Medication & CPT/J Code(s) submitted: [id number] [employer]  Diagnosis Code: M81.0 Route of submission (phone, fax, portal):  Phone # Fax # Auth type: Buy/Bill HB Units/visits requested: 60mg  x 2 doses, q 6 months Reference number:  Approval from: 04/18/2024 to 07/04/24    Dagoberto Armour, CPhT [facility] Phone: [phone] 04/18/2024

## 2024-05-01 DIAGNOSIS — K080 Exfoliation of teeth due to systemic causes: Secondary | ICD-10-CM | POA: Diagnosis not present

## 2024-05-07 ENCOUNTER — Ambulatory Visit (HOSPITAL_COMMUNITY)
Admission: RE | Admit: 2024-05-07 | Discharge: 2024-05-07 | Disposition: A | Discharge status: Home / Self Care | Source: Ambulatory Visit | Attending: Internal Medicine | Admitting: Internal Medicine

## 2024-05-07 VITALS — BP 150/74 | HR 68 | Temp 97.2°F | Resp 16

## 2024-05-07 DIAGNOSIS — K080 Exfoliation of teeth due to systemic causes: Secondary | ICD-10-CM | POA: Diagnosis not present

## 2024-05-07 DIAGNOSIS — M818 Other osteoporosis without current pathological fracture: Secondary | ICD-10-CM | POA: Diagnosis not present

## 2024-05-07 MED ORDER — DENOSUMAB-BNHT 60 MG/ML ~~LOC~~ SOSY
60.0000 mg | PREFILLED_SYRINGE | Freq: Once | SUBCUTANEOUS | Status: AC
  Administered 2024-05-07: 60 mg via SUBCUTANEOUS
  Filled 2024-05-07: qty 1

## 2024-05-16 DIAGNOSIS — F419 Anxiety disorder, unspecified: Secondary | ICD-10-CM | POA: Diagnosis not present

## 2024-05-29 DIAGNOSIS — G5601 Carpal tunnel syndrome, right upper limb: Secondary | ICD-10-CM | POA: Diagnosis not present

## 2024-10-26 ENCOUNTER — Inpatient Hospital Stay: Admitting: Adult Health

## 2024-11-05 ENCOUNTER — Encounter (HOSPITAL_COMMUNITY)

## 2024-11-06 ENCOUNTER — Encounter (HOSPITAL_COMMUNITY)
